# Patient Record
Sex: Male | Born: 1955
Health system: Southern US, Community
[De-identification: ages and names within clinical notes are randomized; demographics above are authoritative.]

## PROBLEM LIST (undated history)

## (undated) DIAGNOSIS — F32A Depression, unspecified: Secondary | ICD-10-CM

## (undated) DIAGNOSIS — Z8719 Personal history of other diseases of the digestive system: Secondary | ICD-10-CM

## (undated) DIAGNOSIS — U071 COVID-19: Secondary | ICD-10-CM

## (undated) DIAGNOSIS — K219 Gastro-esophageal reflux disease without esophagitis: Secondary | ICD-10-CM

## (undated) DIAGNOSIS — E785 Hyperlipidemia, unspecified: Secondary | ICD-10-CM

## (undated) DIAGNOSIS — I25709 Atherosclerosis of coronary artery bypass graft(s), unspecified, with unspecified angina pectoris: Secondary | ICD-10-CM

## (undated) DIAGNOSIS — I2510 Atherosclerotic heart disease of native coronary artery without angina pectoris: Secondary | ICD-10-CM

## (undated) DIAGNOSIS — U099 Post covid-19 condition, unspecified: Secondary | ICD-10-CM

## (undated) DIAGNOSIS — G459 Transient cerebral ischemic attack, unspecified: Secondary | ICD-10-CM

## (undated) DIAGNOSIS — I10 Essential (primary) hypertension: Secondary | ICD-10-CM

## (undated) DIAGNOSIS — F329 Major depressive disorder, single episode, unspecified: Secondary | ICD-10-CM

## (undated) DIAGNOSIS — K5909 Other constipation: Secondary | ICD-10-CM

## (undated) DIAGNOSIS — N400 Enlarged prostate without lower urinary tract symptoms: Secondary | ICD-10-CM

## (undated) DIAGNOSIS — G4730 Sleep apnea, unspecified: Secondary | ICD-10-CM

## (undated) DIAGNOSIS — I219 Acute myocardial infarction, unspecified: Secondary | ICD-10-CM

## (undated) DIAGNOSIS — R053 Chronic cough: Secondary | ICD-10-CM

## (undated) DIAGNOSIS — F419 Anxiety disorder, unspecified: Secondary | ICD-10-CM

## (undated) DIAGNOSIS — E119 Type 2 diabetes mellitus without complications: Secondary | ICD-10-CM

## (undated) DIAGNOSIS — K2270 Barrett's esophagus without dysplasia: Secondary | ICD-10-CM

## (undated) DIAGNOSIS — K635 Polyp of colon: Secondary | ICD-10-CM

## (undated) DIAGNOSIS — Z974 Presence of external hearing-aid: Secondary | ICD-10-CM

## (undated) HISTORY — PX: OTHER SURGICAL HISTORY: SHX169

## (undated) HISTORY — PX: COLONOSCOPY: SHX174

## (undated) HISTORY — DX: Sleep apnea, unspecified: G47.30

## (undated) HISTORY — DX: Atherosclerosis of coronary artery bypass graft(s), unspecified, with unspecified angina pectoris: I25.709

## (undated) HISTORY — DX: Anxiety disorder, unspecified: F41.9

## (undated) HISTORY — DX: Essential (primary) hypertension: I10

## (undated) HISTORY — PX: TEE WITH CARDIOVERSION: SHX5442

## (undated) HISTORY — DX: Acute myocardial infarction, unspecified: I21.9

## (undated) HISTORY — DX: Gastro-esophageal reflux disease without esophagitis: K21.9

## (undated) HISTORY — DX: Polyp of colon: K63.5

## (undated) HISTORY — DX: Type 2 diabetes mellitus without complications: E11.9

## (undated) HISTORY — PX: ANAL FISSURE REPAIR: SHX2312

## (undated) HISTORY — DX: Barrett's esophagus without dysplasia: K22.70

## (undated) HISTORY — DX: Personal history of other diseases of the digestive system: Z87.19

## (undated) HISTORY — DX: Atherosclerotic heart disease of native coronary artery without angina pectoris: I25.10

## (undated) HISTORY — DX: Other constipation: K59.09

## (undated) HISTORY — DX: Hyperlipidemia, unspecified: E78.5

---

## 1983-11-23 HISTORY — PX: EXCISION MASS NECK: SHX6703

## 2005-04-10 ENCOUNTER — Other Ambulatory Visit: Payer: Self-pay

## 2005-04-10 ENCOUNTER — Emergency Department: Payer: Self-pay | Admitting: Emergency Medicine

## 2005-06-17 ENCOUNTER — Ambulatory Visit: Payer: Self-pay | Admitting: Gastroenterology

## 2005-06-30 ENCOUNTER — Emergency Department: Payer: Self-pay | Admitting: Unknown Physician Specialty

## 2006-02-09 ENCOUNTER — Ambulatory Visit: Payer: Self-pay | Admitting: Gastroenterology

## 2009-03-14 ENCOUNTER — Inpatient Hospital Stay: Payer: Self-pay | Admitting: Internal Medicine

## 2009-03-19 ENCOUNTER — Inpatient Hospital Stay: Payer: Self-pay | Admitting: Internal Medicine

## 2009-04-22 ENCOUNTER — Ambulatory Visit: Payer: Self-pay | Admitting: Cardiology

## 2009-04-22 ENCOUNTER — Inpatient Hospital Stay (HOSPITAL_COMMUNITY): Admission: EM | Admit: 2009-04-22 | Discharge: 2009-04-24 | Payer: Self-pay | Admitting: Emergency Medicine

## 2009-05-09 ENCOUNTER — Observation Stay (HOSPITAL_COMMUNITY): Admission: EM | Admit: 2009-05-09 | Discharge: 2009-05-10 | Payer: Self-pay | Admitting: Emergency Medicine

## 2009-05-09 ENCOUNTER — Ambulatory Visit: Payer: Self-pay | Admitting: Cardiology

## 2009-05-28 ENCOUNTER — Encounter: Payer: Self-pay | Admitting: Cardiology

## 2009-06-22 ENCOUNTER — Encounter: Payer: Self-pay | Admitting: Cardiology

## 2009-07-03 ENCOUNTER — Ambulatory Visit: Payer: Self-pay | Admitting: Cardiology

## 2009-07-03 ENCOUNTER — Observation Stay (HOSPITAL_COMMUNITY): Admission: EM | Admit: 2009-07-03 | Discharge: 2009-07-04 | Payer: Self-pay | Admitting: Emergency Medicine

## 2009-07-23 ENCOUNTER — Encounter: Payer: Self-pay | Admitting: Cardiology

## 2009-08-20 ENCOUNTER — Ambulatory Visit: Payer: Self-pay | Admitting: Unknown Physician Specialty

## 2009-08-22 ENCOUNTER — Encounter: Payer: Self-pay | Admitting: Cardiology

## 2009-10-22 ENCOUNTER — Ambulatory Visit: Payer: Self-pay | Admitting: Gastroenterology

## 2009-11-18 ENCOUNTER — Ambulatory Visit: Payer: Self-pay | Admitting: Internal Medicine

## 2009-11-18 ENCOUNTER — Inpatient Hospital Stay (HOSPITAL_COMMUNITY): Admission: EM | Admit: 2009-11-18 | Discharge: 2009-11-20 | Payer: Self-pay | Admitting: Emergency Medicine

## 2009-11-19 ENCOUNTER — Encounter: Payer: Self-pay | Admitting: Internal Medicine

## 2009-11-22 HISTORY — PX: CORONARY ANGIOPLASTY WITH STENT PLACEMENT: SHX49

## 2009-11-22 HISTORY — PX: CARDIAC CATHETERIZATION: SHX172

## 2010-04-07 ENCOUNTER — Ambulatory Visit: Payer: Self-pay | Admitting: Internal Medicine

## 2010-04-07 ENCOUNTER — Observation Stay (HOSPITAL_COMMUNITY): Admission: EM | Admit: 2010-04-07 | Discharge: 2010-04-08 | Payer: Self-pay | Admitting: Emergency Medicine

## 2010-04-09 ENCOUNTER — Inpatient Hospital Stay: Payer: Self-pay | Admitting: Internal Medicine

## 2010-04-14 ENCOUNTER — Emergency Department: Payer: Self-pay | Admitting: Emergency Medicine

## 2010-04-22 ENCOUNTER — Ambulatory Visit: Payer: Self-pay | Admitting: Cardiology

## 2010-04-28 ENCOUNTER — Encounter: Payer: Self-pay | Admitting: Cardiology

## 2010-05-01 ENCOUNTER — Ambulatory Visit: Payer: Self-pay | Admitting: Specialist

## 2010-05-22 ENCOUNTER — Encounter: Payer: Self-pay | Admitting: Cardiology

## 2010-06-17 ENCOUNTER — Ambulatory Visit: Payer: Self-pay | Admitting: Gastroenterology

## 2010-06-26 ENCOUNTER — Observation Stay: Payer: Self-pay | Admitting: Specialist

## 2010-12-22 NOTE — Letter (Signed)
Summary: [employer] Certification Initial Review Notice   [employer] Initial Review Notice   Imported By: Roderic Ovens 06/15/2010 14:18:21  _____________________________________________________________________  External Attachment:    Type:   Image     Comment:   External Document

## 2010-12-31 ENCOUNTER — Telehealth: Payer: Self-pay | Admitting: Cardiology

## 2011-01-07 NOTE — Progress Notes (Signed)
Summary: refill request  Phone Note Refill Request   linsinipril    Method Requested: Telephone to Pharmacy Initial call taken by: Glynda Jaeger,  December 31, 2010 3:25 PM    New/Updated Medications: LISINOPRIL 5 MG TABS (LISINOPRIL) Take one tablet by mouth daily Prescriptions: LISINOPRIL 5 MG TABS (LISINOPRIL) Take one tablet by mouth daily  #30 x 12   Entered by:   Kem Parkinson   Authorized by:   Ferman Hamming, MD, [facility]   Signed by:   Kem Parkinson on 12/31/2010   Method used:   Electronically to        [facility] [employer] [phone]* (retail)       [address] [place], [place]  [address]       Ph: [phone] or [phone]       Fax: [phone]   RxID:   [id number]

## 2011-02-08 LAB — BASIC METABOLIC PANEL
CO2: 24 mEq/L (ref 19–32)
Creatinine, Ser: 1.31 mg/dL (ref 0.4–1.5)
GFR calc non Af Amer: 57 mL/min — ABNORMAL LOW (ref >60)

## 2011-02-08 LAB — CBC
HCT: 43.2 % (ref 39.0–52.0)
Hemoglobin: 14.9 g/dL (ref 13.0–17.0)
MCHC: 34.5 g/dL (ref 30.0–36.0)
MCV: 88.4 fL (ref 78.0–100.0)
RDW: 14.2 % (ref 11.5–15.5)
WBC: 7.7 10*3/uL (ref 4.0–10.5)

## 2011-02-08 LAB — POCT CARDIAC MARKERS
CKMB, poc: 1.1 ng/mL (ref 1.0–8.0)
CKMB, poc: 1.6 ng/mL (ref 1.0–8.0)
Myoglobin, poc: 117 ng/mL (ref 12–200)
Myoglobin, poc: 118 ng/mL (ref 12–200)

## 2011-02-08 LAB — DIFFERENTIAL
Basophils Relative: 1 % (ref 0–1)
Eosinophils Absolute: 0.3 10*3/uL (ref 0.0–0.7)
Lymphs Abs: 1 10*3/uL (ref 0.7–4.0)
Neutrophils Relative %: 77 % (ref 43–77)

## 2011-02-08 LAB — TROPONIN I: Troponin I: <0.01 ng/mL (ref 0.00–0.06)

## 2011-02-08 LAB — GLUCOSE, CAPILLARY: Glucose-Capillary: 63 mg/dL — ABNORMAL LOW (ref 70–99)

## 2011-02-08 LAB — CK TOTAL AND CKMB (NOT AT ARMC)
CK, MB: 2.5 ng/mL (ref 0.3–4.0)
Total CK: 266 U/L — ABNORMAL HIGH (ref 7–232)

## 2011-02-08 LAB — CARDIAC PANEL(CRET KIN+CKTOT+MB+TROPI): Troponin I: <0.01 ng/mL (ref 0.00–0.06)

## 2011-02-22 LAB — BASIC METABOLIC PANEL
Calcium: 8.6 mg/dL (ref 8.4–10.5)
Chloride: 107 mEq/L (ref 96–112)
Creatinine, Ser: 1.11 mg/dL (ref 0.4–1.5)
Creatinine, Ser: 1.12 mg/dL (ref 0.4–1.5)
GFR calc Af Amer: >60 mL/min (ref >60)
GFR calc Af Amer: >60 mL/min (ref >60)
GFR calc non Af Amer: >60 mL/min (ref >60)
GFR calc non Af Amer: >60 mL/min (ref >60)
Potassium: 4.2 mEq/L (ref 3.5–5.1)

## 2011-02-22 LAB — CBC
HCT: 38.9 % — ABNORMAL LOW (ref 39.0–52.0)
HCT: 39.8 % (ref 39.0–52.0)
Hemoglobin: 13.7 g/dL (ref 13.0–17.0)
Hemoglobin: 13.9 g/dL (ref 13.0–17.0)
MCHC: 34.8 g/dL (ref 30.0–36.0)
MCV: 88.6 fL (ref 78.0–100.0)
Platelets: 130 10*3/uL — ABNORMAL LOW (ref 150–400)
Platelets: 131 10*3/uL — ABNORMAL LOW (ref 150–400)
RBC: 4.41 MIL/uL (ref 4.22–5.81)
RBC: 4.49 MIL/uL (ref 4.22–5.81)
RDW: 13.7 % (ref 11.5–15.5)
RDW: 13.8 % (ref 11.5–15.5)
WBC: 5.6 10*3/uL (ref 4.0–10.5)

## 2011-02-22 LAB — COMPREHENSIVE METABOLIC PANEL
CO2: 25 mEq/L (ref 19–32)
Calcium: 8.7 mg/dL (ref 8.4–10.5)
Creatinine, Ser: 1.07 mg/dL (ref 0.4–1.5)
GFR calc Af Amer: >60 mL/min (ref >60)
GFR calc non Af Amer: >60 mL/min (ref >60)
Sodium: 138 mEq/L (ref 135–145)
Total Bilirubin: 1 mg/dL (ref 0.3–1.2)

## 2011-02-22 LAB — HEPARIN LEVEL (UNFRACTIONATED): Heparin Unfractionated: 0.37 IU/mL (ref 0.30–0.70)

## 2011-02-22 LAB — LIPID PANEL
Cholesterol: 114 mg/dL (ref 0–200)
LDL Cholesterol: 70 mg/dL (ref 0–99)
Total CHOL/HDL Ratio: 3.9 RATIO

## 2011-02-22 LAB — DIFFERENTIAL
Basophils Absolute: 0 10*3/uL (ref 0.0–0.1)
Eosinophils Absolute: 0.2 10*3/uL (ref 0.0–0.7)
Lymphs Abs: 0.8 10*3/uL (ref 0.7–4.0)
Monocytes Absolute: 0.4 10*3/uL (ref 0.1–1.0)
Monocytes Relative: 8 % (ref 3–12)
Neutro Abs: 4 10*3/uL (ref 1.7–7.7)
Neutrophils Relative %: 74 % (ref 43–77)

## 2011-02-22 LAB — POCT CARDIAC MARKERS
Myoglobin, poc: 84.1 ng/mL (ref 12–200)
Troponin i, poc: <0.05 ng/mL (ref 0.00–0.09)

## 2011-02-22 LAB — CARDIAC PANEL(CRET KIN+CKTOT+MB+TROPI)
Total CK: 114 U/L (ref 7–232)
Troponin I: <0.01 ng/mL (ref 0.00–0.06)

## 2011-02-22 LAB — GLUCOSE, CAPILLARY
Glucose-Capillary: 146 mg/dL — ABNORMAL HIGH (ref 70–99)
Glucose-Capillary: 50 mg/dL — ABNORMAL LOW (ref 70–99)
Glucose-Capillary: 52 mg/dL — ABNORMAL LOW (ref 70–99)
Glucose-Capillary: 55 mg/dL — ABNORMAL LOW (ref 70–99)
Glucose-Capillary: 73 mg/dL (ref 70–99)
Glucose-Capillary: 85 mg/dL (ref 70–99)
Glucose-Capillary: 85 mg/dL (ref 70–99)
Glucose-Capillary: 93 mg/dL (ref 70–99)
Glucose-Capillary: 95 mg/dL (ref 70–99)

## 2011-02-22 LAB — TROPONIN I: Troponin I: 0.02 ng/mL (ref 0.00–0.06)

## 2011-02-22 LAB — BRAIN NATRIURETIC PEPTIDE: Pro B Natriuretic peptide (BNP): 33 pg/mL (ref 0.0–100.0)

## 2011-02-22 LAB — PROTIME-INR: INR: 1.18 (ref 0.00–1.49)

## 2011-02-22 LAB — CK TOTAL AND CKMB (NOT AT ARMC): Relative Index: INVALID (ref 0.0–2.5)

## 2011-02-27 LAB — BASIC METABOLIC PANEL
BUN: 13 mg/dL (ref 6–23)
CO2: 24 mEq/L (ref 19–32)
Calcium: 8.5 mg/dL (ref 8.4–10.5)
GFR calc non Af Amer: >60 mL/min (ref >60)
Glucose, Bld: 105 mg/dL — ABNORMAL HIGH (ref 70–99)

## 2011-02-27 LAB — DIFFERENTIAL
Basophils Absolute: 0 10*3/uL (ref 0.0–0.1)
Basophils Relative: 1 % (ref 0–1)
Eosinophils Relative: 4 % (ref 0–5)
Lymphocytes Relative: 13 % (ref 12–46)
Monocytes Absolute: 0.4 10*3/uL (ref 0.1–1.0)
Neutro Abs: 3.8 10*3/uL (ref 1.7–7.7)

## 2011-02-27 LAB — CARDIAC PANEL(CRET KIN+CKTOT+MB+TROPI)
CK, MB: 1 ng/mL (ref 0.3–4.0)
CK, MB: 1.1 ng/mL (ref 0.3–4.0)
Relative Index: INVALID (ref 0.0–2.5)
Relative Index: INVALID (ref 0.0–2.5)
Total CK: 70 U/L (ref 7–232)
Total CK: 73 U/L (ref 7–232)
Troponin I: 0.01 ng/mL (ref 0.00–0.06)
Troponin I: <0.01 ng/mL (ref 0.00–0.06)

## 2011-02-27 LAB — POCT CARDIAC MARKERS
CKMB, poc: <1 ng/mL — ABNORMAL LOW (ref 1.0–8.0)
Troponin i, poc: <0.05 ng/mL (ref 0.00–0.09)

## 2011-02-27 LAB — CBC
HCT: 39.5 % (ref 39.0–52.0)
MCHC: 34.4 g/dL (ref 30.0–36.0)
Platelets: 138 10*3/uL — ABNORMAL LOW (ref 150–400)
RDW: 13.9 % (ref 11.5–15.5)

## 2011-02-27 LAB — TROPONIN I: Troponin I: <0.01 ng/mL (ref 0.00–0.06)

## 2011-02-27 LAB — GLUCOSE, CAPILLARY: Glucose-Capillary: 84 mg/dL (ref 70–99)

## 2011-02-27 LAB — CK TOTAL AND CKMB (NOT AT ARMC): Relative Index: INVALID (ref 0.0–2.5)

## 2011-03-01 LAB — CARDIAC PANEL(CRET KIN+CKTOT+MB+TROPI)
CK, MB: 0.9 ng/mL (ref 0.3–4.0)
CK, MB: 1.3 ng/mL (ref 0.3–4.0)
Relative Index: INVALID (ref 0.0–2.5)
Total CK: 56 U/L (ref 7–232)
Total CK: 58 U/L (ref 7–232)
Troponin I: 0.01 ng/mL (ref 0.00–0.06)

## 2011-03-01 LAB — D-DIMER, QUANTITATIVE: D-Dimer, Quant: 0.33 ug/mL-FEU (ref 0.00–0.48)

## 2011-03-01 LAB — POCT CARDIAC MARKERS
CKMB, poc: <1 ng/mL — ABNORMAL LOW (ref 1.0–8.0)
CKMB, poc: <1 ng/mL — ABNORMAL LOW (ref 1.0–8.0)
Myoglobin, poc: 105 ng/mL (ref 12–200)
Myoglobin, poc: 90.9 ng/mL (ref 12–200)
Myoglobin, poc: 96.8 ng/mL (ref 12–200)
Myoglobin, poc: 110 ng/mL (ref 12–200)
Myoglobin, poc: 80.5 ng/mL (ref 12–200)
Troponin i, poc: <0.05 ng/mL (ref 0.00–0.09)
Troponin i, poc: <0.05 ng/mL (ref 0.00–0.09)

## 2011-03-01 LAB — GLUCOSE, CAPILLARY
Glucose-Capillary: 187 mg/dL — ABNORMAL HIGH (ref 70–99)
Glucose-Capillary: 60 mg/dL — ABNORMAL LOW (ref 70–99)
Glucose-Capillary: 63 mg/dL — ABNORMAL LOW (ref 70–99)
Glucose-Capillary: 72 mg/dL (ref 70–99)
Glucose-Capillary: 109 mg/dL — ABNORMAL HIGH (ref 70–99)
Glucose-Capillary: 79 mg/dL (ref 70–99)
Glucose-Capillary: 87 mg/dL (ref 70–99)
Glucose-Capillary: 90 mg/dL (ref 70–99)
Glucose-Capillary: 90 mg/dL (ref 70–99)
Glucose-Capillary: 92 mg/dL (ref 70–99)
Glucose-Capillary: 99 mg/dL (ref 70–99)
Glucose-Capillary: 99 mg/dL (ref 70–99)

## 2011-03-01 LAB — DIFFERENTIAL
Eosinophils Relative: 3 % (ref 0–5)
Lymphocytes Relative: 9 % — ABNORMAL LOW (ref 12–46)
Lymphs Abs: 0.6 10*3/uL — ABNORMAL LOW (ref 0.7–4.0)

## 2011-03-01 LAB — COMPREHENSIVE METABOLIC PANEL
BUN: 15 mg/dL (ref 6–23)
CO2: 24 mEq/L (ref 19–32)
Calcium: 9.7 mg/dL (ref 8.4–10.5)
Creatinine, Ser: 1.06 mg/dL (ref 0.4–1.5)
GFR calc non Af Amer: >60 mL/min (ref >60)
Glucose, Bld: 49 mg/dL — ABNORMAL LOW (ref 70–99)

## 2011-03-01 LAB — POCT I-STAT, CHEM 8
BUN: 17 mg/dL (ref 6–23)
Creatinine, Ser: 1.3 mg/dL (ref 0.4–1.5)
Potassium: 4.6 mEq/L (ref 3.5–5.1)
Sodium: 140 mEq/L (ref 135–145)

## 2011-03-01 LAB — CBC
HCT: 39.8 % (ref 39.0–52.0)
HCT: 43.9 % (ref 39.0–52.0)
Hemoglobin: 14.8 g/dL (ref 13.0–17.0)
MCHC: 34.9 g/dL (ref 30.0–36.0)
Platelets: 145 10*3/uL — ABNORMAL LOW (ref 150–400)
Platelets: 157 10*3/uL (ref 150–400)
RBC: 5.01 MIL/uL (ref 4.22–5.81)
RDW: 14.7 % (ref 11.5–15.5)
RDW: 14.7 % (ref 11.5–15.5)
WBC: 6.5 10*3/uL (ref 4.0–10.5)

## 2011-03-01 LAB — TROPONIN I: Troponin I: 0.01 ng/mL (ref 0.00–0.06)

## 2011-03-01 LAB — APTT: aPTT: 32 seconds (ref 24–37)

## 2011-03-01 LAB — HEPARIN LEVEL (UNFRACTIONATED): Heparin Unfractionated: 0.74 IU/mL — ABNORMAL HIGH (ref 0.30–0.70)

## 2011-03-01 LAB — PROTIME-INR: Prothrombin Time: 13.9 seconds (ref 11.6–15.2)

## 2011-03-01 LAB — TSH: TSH: 0.76 u[IU]/mL (ref 0.350–4.500)

## 2011-03-24 IMAGING — CR DG CHEST 1V PORT
1 series · 1 of 1 positions shown · non-contrast
Comparison: none

REASON FOR EXAM: Chest Pain
COMMENTS:

PROCEDURE:     DXR - DXR PORTABLE CHEST SINGLE VIEW  - March 14, 2009 [DATE]
RESULT:     The lungs are clear. The heart and pulmonary vessels are normal.
The bony and mediastinal structures are unremarkable. There is no effusion.
There is no pneumothorax or evidence of congestive failure.

[view not recorded]
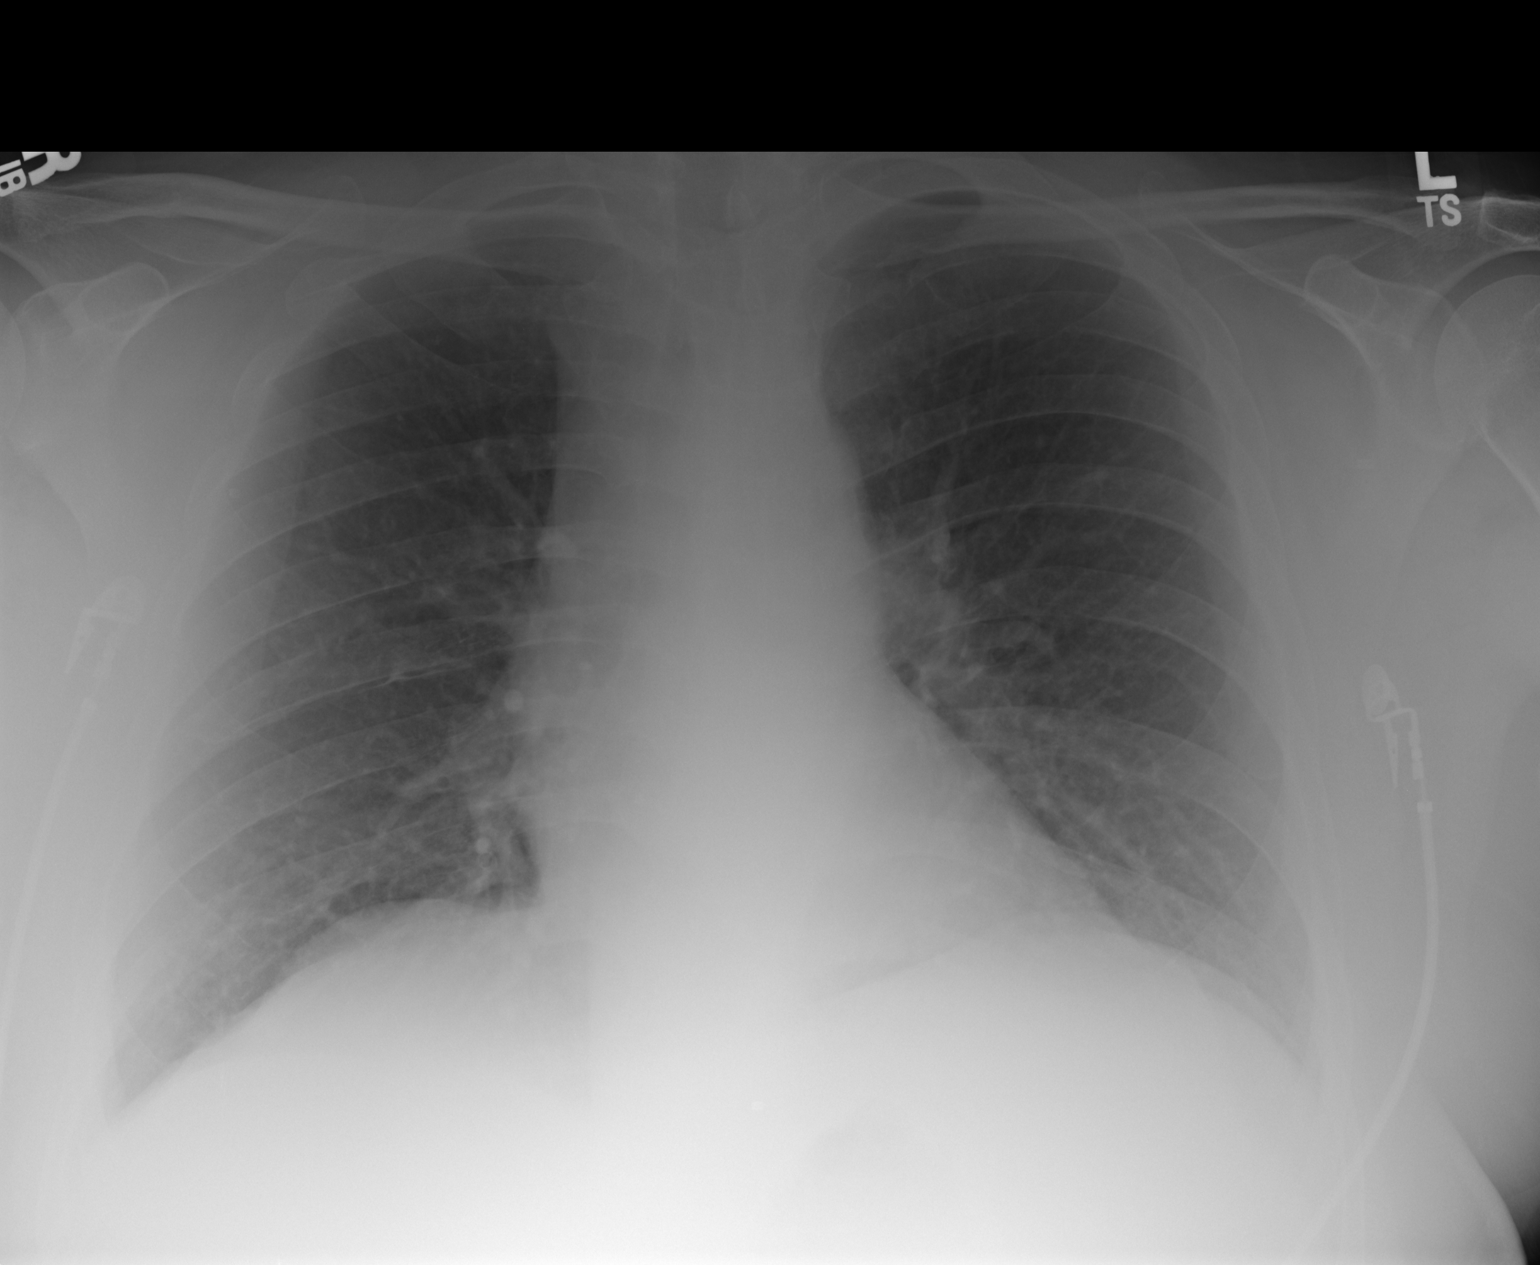

[1 of 1 positions shown; findings below may reference images not displayed]

IMPRESSION: No acute cardiopulmonary disease.

## 2011-03-24 IMAGING — CT CT HEAD WITHOUT CONTRAST
2 series · 16 of 30 positions shown, 20 images · non-contrast
Comparison: none

REASON FOR EXAM: headache, R sided tingling, vertigo
COMMENTS:

PROCEDURE:     CT  - CT HEAD WITHOUT CONTRAST  - March 14, 2009 [DATE]
RESULT:
HISTORY: Headache.
COMPARISON STUDIES: Head CT of 04-10-05.
PROCEDURE AND FINDINGS: Standard non-enhanced Head CT is obtained. No mass
lesion is noted. There is no hydrocephalus. No acute bony abnormalities are
identified.

[Series 2: without · axial · non-contrast · 0.41mm/px · z∈[-157,-32]mm · 13 of 31 slices shown, 17 images]
[im 3/31  brain]
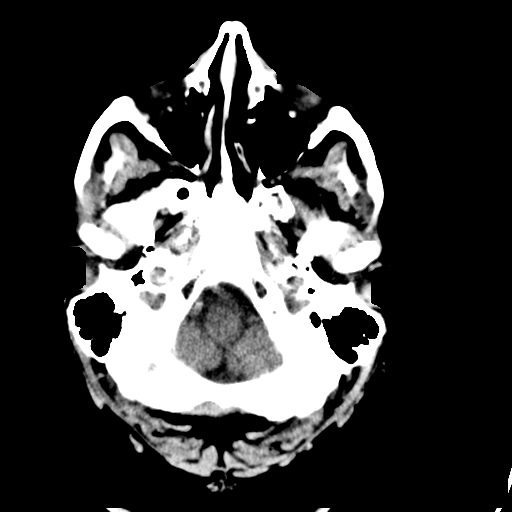
[im 3/31  bone]
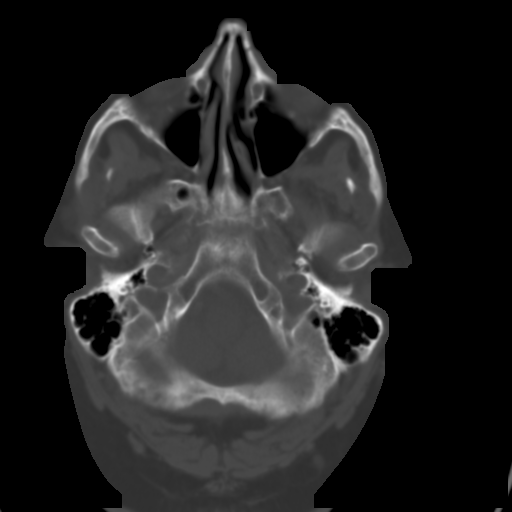
[im 5/31  brain]
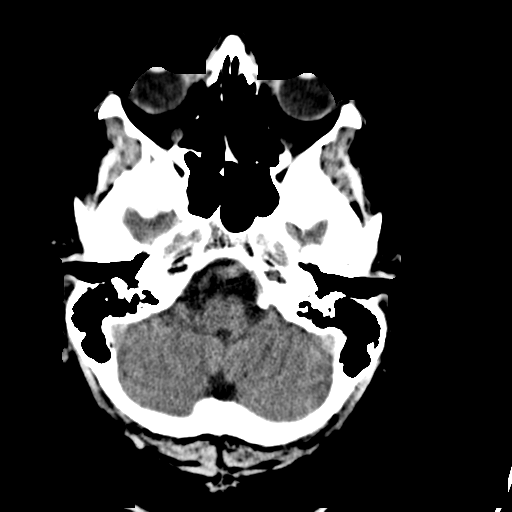
[im 7/31  brain]
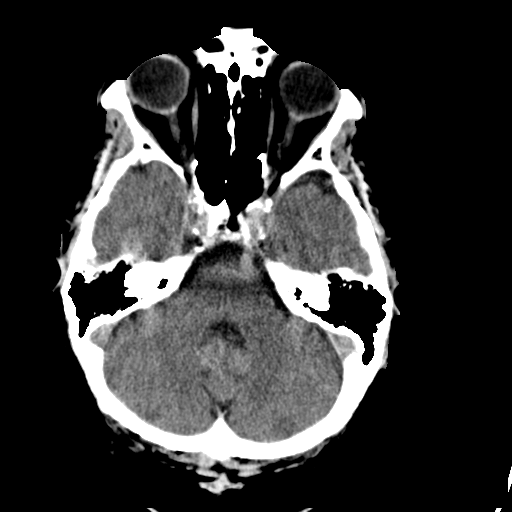
[im 9/31  brain]
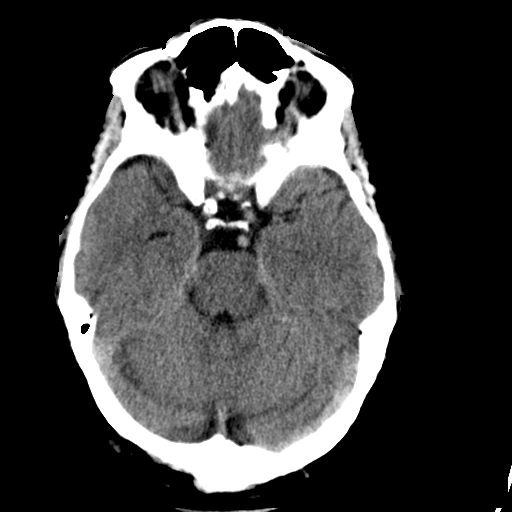
[im 11/31  brain]
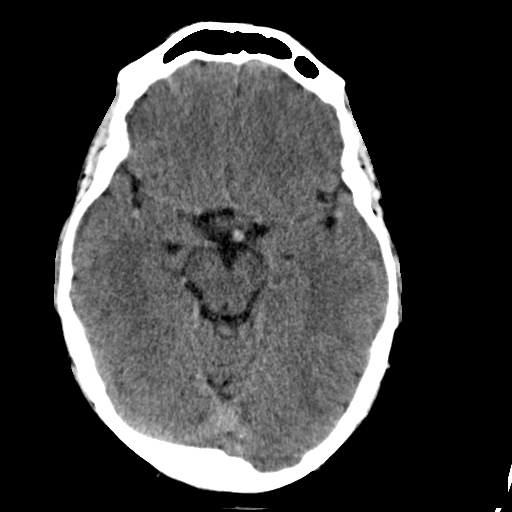
[im 11/31  bone]
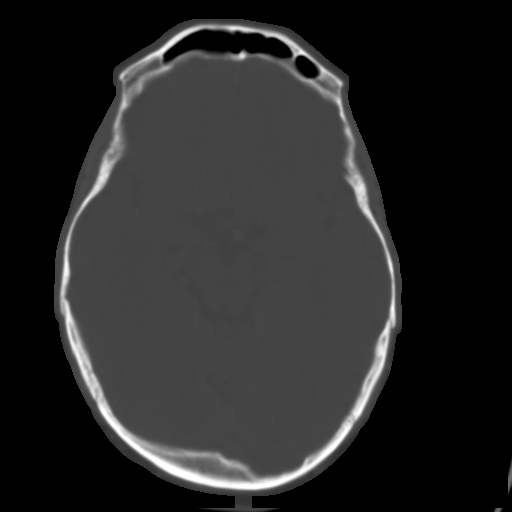
[im 13/31  brain]
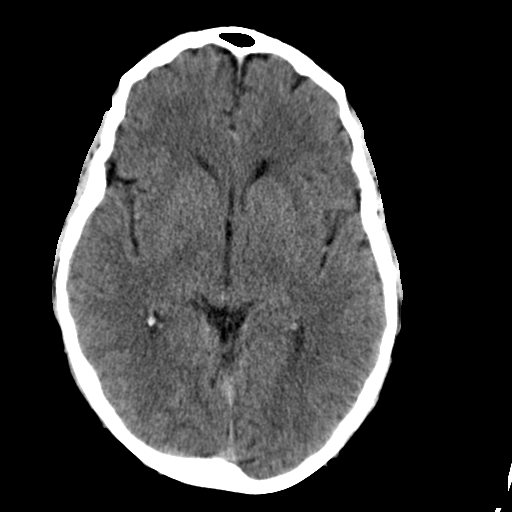
[im 16/31  brain]
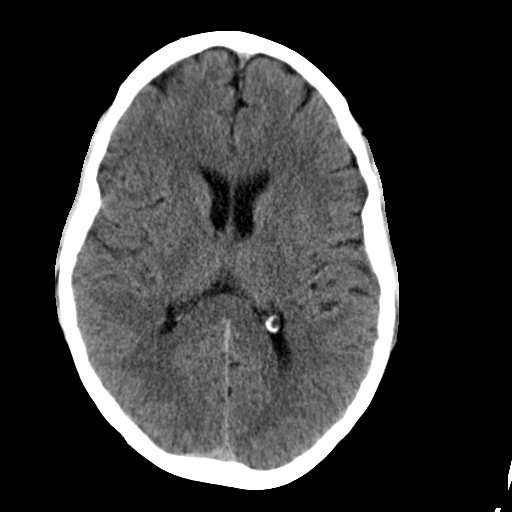
[im 18/31  brain]
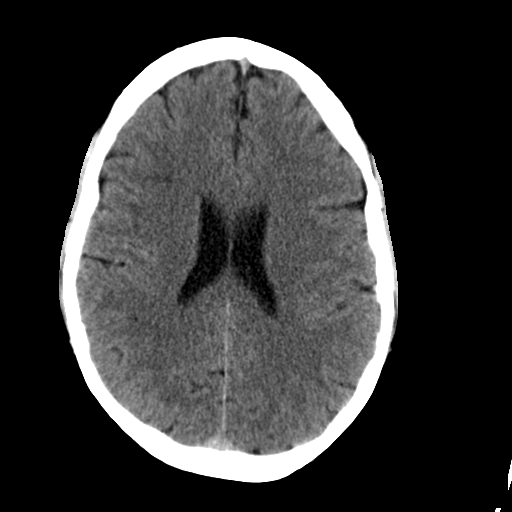
[im 20/31  brain]
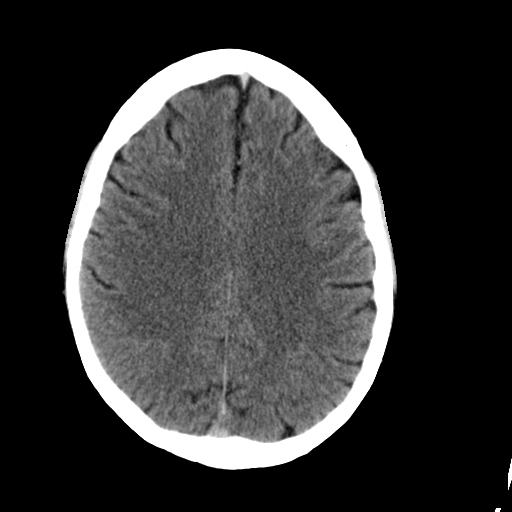
[im 20/31  bone]
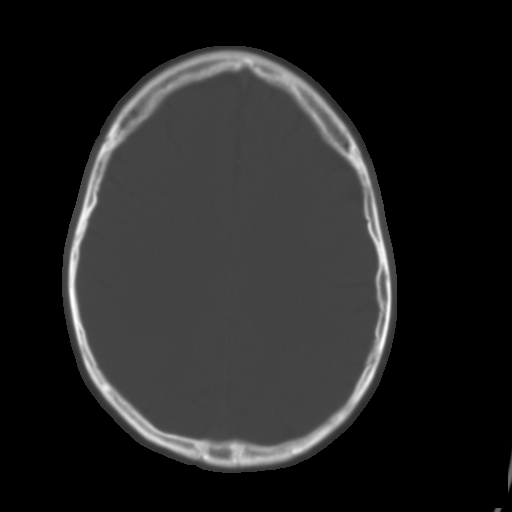
[im 22/31  brain]
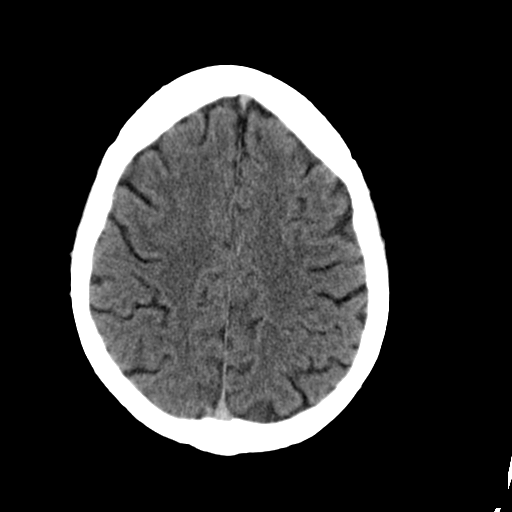
[im 24/31  brain]
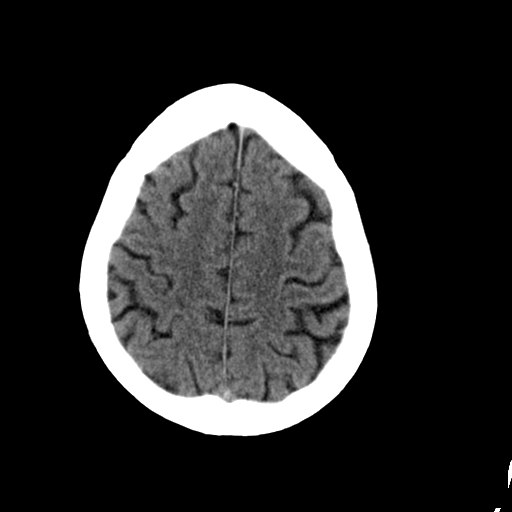
[im 26/31  brain]
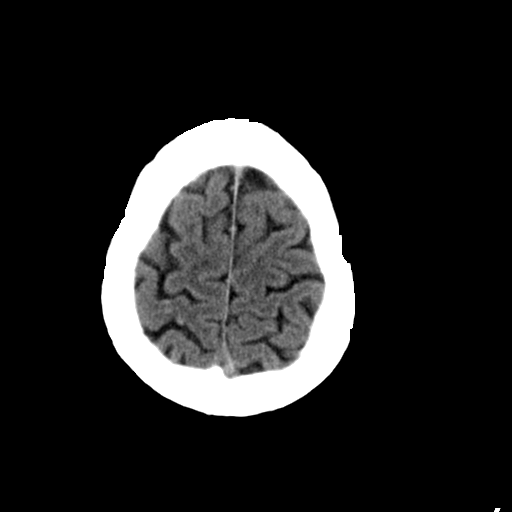
[im 28/31  brain]
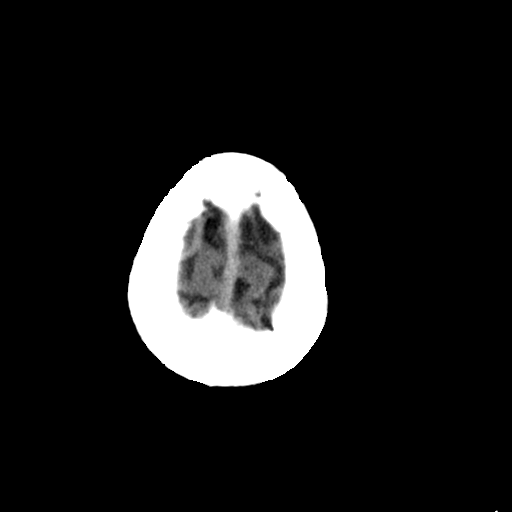
[im 28/31  bone]
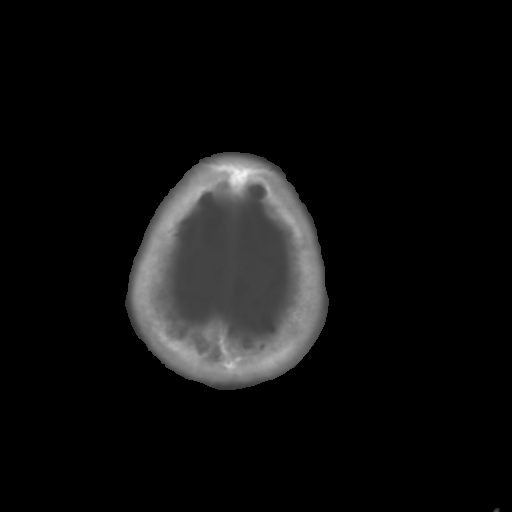

[Series 3: bone · axial · 0.41mm/px · z∈[-157,-117]mm · 3 of 31 slices shown]
[im 3/31  bone]
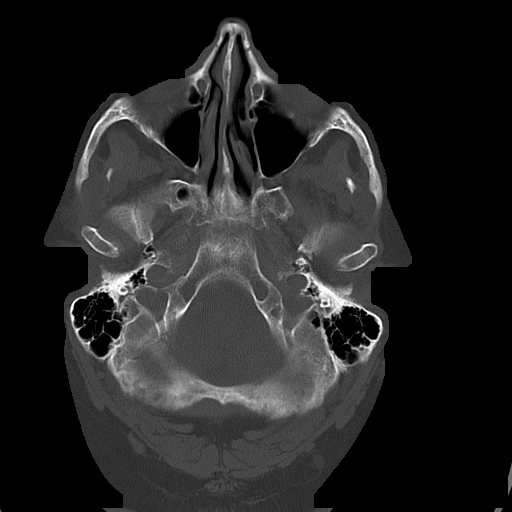
[im 7/31  bone]
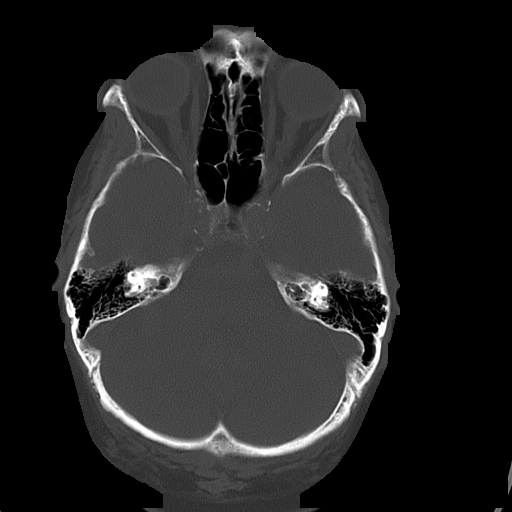
[im 11/31  bone]
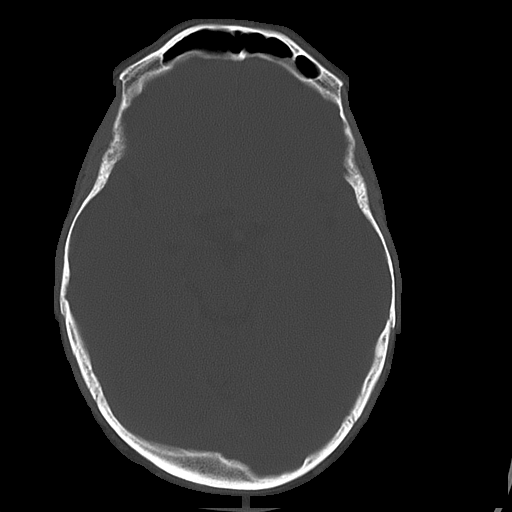

[16 of 30 positions shown; findings below may reference images not displayed]

IMPRESSION: 1. No acute abnormality. The posterior fossa is unremarkable.

## 2011-04-06 NOTE — Discharge Summary (Signed)
[name], [name] NO.:  [id number]   MEDICAL RECORD NO.:  [id number]          PATIENT TYPE:  INP   LOCATION:  4707                         FACILITY:  [facility]   PHYSICIAN:  [name], MDDATE OF BIRTH:  07/29/1956   DATE OF ADMISSION:  04/22/2009  DATE OF DISCHARGE:                               DISCHARGE SUMMARY   PRIMARY CARDIOLOGIST:  Lee Millard, MD, at [facility] in  [place]   PRIMARY CARE PHYSICIAN:  [name], at [facility] in  [place], [place].   DISCHARGE DIAGNOSIS:  Noncardiac chest pain.   SECONDARY DIAGNOSES:  1. Coronary artery disease, status post cardiac catheterization with      drug-eluting stent to the right coronary artery and posterior      descending artery in March 14, 2009, and status post drug-eluting      stent to the first obtuse marginal on March 20, 2009.  2. Diabetes mellitus.  3. Hypertension.  4. Hyperlipidemia.  5. Morbid obesity, (BMI 40.7).  6. History of Barrett esophagus.   ALLERGIES:  NKDA.   PROCEDURES PERFORMED DURING THIS HOSPITALIZATION:  1. EKG performed on April 22, 2009, showing normal sinus rhythm at a      rate of 71, T-wave inversion in lead III, otherwise no acute ST-T-      wave changes, no significant Q-waves.  Normal axis.  No evidence of      hypertrophy.  PR 160, QRS 88, and QTc 400.  2. Chest x-ray completed on April 22, 2009, showing no acute      cardiopulmonary disease.  3. Cardiac catheterization performed on April 23, 2009, revealing double-      vessel CAD, patent stents in OM1 and RCA, and normal LV function.      Recommendation is for continued medical management.   HISTORY OF PRESENT ILLNESS:  [name] is a 55 year old Caucasian  male with a known history of CAD.  He was in his usual state of health  this a.m. when he experienced diaphoresis and nausea with minimal  exertion.  He went on to work where he developed chest pain.   He  describes pain as both the pressure and a twisting sensation at 8/10 at  its worst, which was associated with diaphoresis, nausea, and shortness  of breath.  There was no radiation.  He took one sublingual  nitroglycerin with symptom alleviation.  The symptoms returned and he  contacted his primary MD, then his cardiologist who advised EMS  transport to closest hospital.  He took a second sublingual  nitroglycerin in route to the hospital.  Pain was relieved, but BP  dropped.  He received fluid resuscitation with resolution of his  hypertension.  In the ED, his symptoms returned and was treated with  morphine x2.  At the time of his eval by Cardiology at the hospital.  He  reports his symptoms today were different from his prior angina.   HOSPITAL COURSE:  The patient admitted and underwent procedures as  described above.  He tolerated them well without significant  complications.  Per recommendations from  his interventional  cardiologist, the patient will continue medical management.  His  metformin will be held for 48 hours.  He will be discharged today as he  is in stable condition.  He has been instructed to follow up with his  primary cardiologist in 2 weeks.  At the time of discharge, he received  his medication list (unchanged), follow up instructions, and post  cardiac cath instructions.  All questions and concerns will be addressed  at that time.   One set of negative point of care markers and 3 full sets of cardiac  enzymes were negative.  TSH 0.760, otherwise see H and P.   FOLLOWUP PLANS AND APPOINTMENTS:  1. Dr. Darrold [name], follow up in approximately 2 weeks, the patient to      schedule.  2. Dr. Terance [name], follow up in approximately 4 weeks, the patient to      schedule.   DISCHARGE MEDICATIONS:  1. Glipizide 10 mg p.o. b.i.d.  2. Metformin 850 mg p.o. b.i.d. (to be started on April 25, 2009).  3. Aspirin 81 mg p.o. q.i.d., (4 tablets daily).  4. Lipitor 40 mg p.o.  daily.  5. Lisinopril/Hydrochlorothiazide 20/25 mg p.o. daily.  6. Metoprolol succinate 25 mg p.o. daily.  7. Xanax 0.25 mg p.r.n. for anxiety.   DURATION OF DISCHARGE ENCOUNTER INCLUDING PHYSICIAN TIME:  35 minutes.      [name], [facility]      Lee Carrow, MD  Electronically Signed    MS/MEDQ  D:  04/23/2009  T:  04/24/2009  Job:  [id number]   cc:   Lee Rieger, MD

## 2011-04-06 NOTE — H&P (Signed)
[name], [name] NO.:  [id number]   MEDICAL RECORD NO.:  [id number]          PATIENT TYPE:  INP   LOCATION:  4707                         FACILITY:  [facility]   PHYSICIAN:  Luis Abed, MD, FACCDATE OF BIRTH:  [birth year]   DATE OF ADMISSION:  04/22/2009  DATE OF DISCHARGE:                              HISTORY & PHYSICAL   PRIMARY CARE PHYSICIAN:  Dr. Terance Hart at [facility] in [place],  [place].   PRIMARY CARDIOLOGIST:  Dr. Darrold Junker at the [facility] in  [place], [place].   CHIEF COMPLAINT:  Chest pain.   HISTORY OF PRESENT ILLNESS:  Mr. Peddy is a 55 year old male with a  history of coronary artery disease.  He was in his usual state of health  this a.m. and had diaphoresis and nausea with minimal exertion.  He went  on to work and at work, he developed chest pain.  He describes as both a  pressure and a twisting sensation.  It reached to an 8/10.  He again had  diaphoresis, nausea, and shortness of breath.  There was no radiation.  He tried one sublingual nitroglycerin, which helped his symptoms;  however, they return.  He contacted his primary MD and then his  cardiologist who advised EMS transport to the closest hospital.  He  called EMS and received a second sublingual nitroglycerin during  transport.  This relieved his pain, but his blood pressure dropped.  He  received fluid resuscitation and his blood pressure improved.  In the  emergency room when his symptoms returned, he was treated with morphine  twice and after the second dose of morphine, the pain has not come back.  His previous angina was pressure sensation, which he feels it is  different from the feeling he had today.  He last had a stent on March 20, 2009 and since that stent, this is the first episode of any type of  chest pain he has had.  Currently, he is resting comfortably.   PAST MEDICAL HISTORY:  1. Status post cardiac catheterization with  drug-eluting stents to the      RCA and PDA on March 14, 2009.  2. Status post drug-eluting stent to the OM 1 on March 20, 2009.  3. Diabetes.  4. Hypertension.  5. Hyperlipidemia.  6. Morbid obesity with a body mass index of 40.7.  7. History of Barrett esophagus.   SURGICAL HISTORY:  He is status post cardiac catheterizations as well as  treatment for folliculitis requiring skin grafts.  He has also had a  colonoscopy and an EGD.   ALLERGIES:  No known drug allergies.   CURRENT MEDICATIONS:  1. Xanax 0.25 mg p.r.n.  2. Aspirin 325 mg or 81 mg x4 daily.  3. Glipizide 10 mg b.i.d.  4. Lipitor 40 mg a day.  5. Lisinopril and hydrochlorothiazide 20/25 mg daily.  6. Metformin 850 mg b.i.d.  7. Metoprolol tartrate 25 mg daily.  8. Plavix 75 mg daily.  9. Sublingual nitroglycerin p.r.n.   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  He denies any history of alcohol, tobacco,  or drug abuse.   FAMILY HISTORY:  Both of his parents are living in her late 80s or early  36s and neither one nor his siblings has any heart disease.   REVIEW OF SYSTEMS:  He has had diaphoresis today.  He says he coughs in  the morning, but it is nonproductive.  He has some chronic dyspnea on  exertion that has not changed recently.  He denies orthopnea, PND,  edema, or palpitations.  He has had no presyncope or claudication  symptoms or wheezing.  He has had nausea today, but no melena.  No  reflux symptoms.  No abdominal pain.  Full 14-point review of systems is  otherwise negative.   PHYSICAL EXAMINATION:  VITAL SIGNS:  Temperature 97.4, blood pressure  108/63, pulse 70, respiratory rate 16, O2 saturation 100% on 2 liters.  GENERAL:  He is a well-developed obese white male in no acute distress.  HEENT:  Normal.  NECK:  There is no lymphadenopathy, thyromegaly, or JVD noted.  CV:  His heart is regular in rate and rhythm with an S1, S2 with no   significant murmur, rub, or gallop is noted.  Distal pulses are intact  in all four extremities.  LUNGS:  Clear to auscultation bilaterally.  SKIN:  No rashes or lesions are noted.  ABDOMEN:  Soft and nontender with active bowel sounds and no  hepatosplenomegaly by percussion.  EXTREMITIES:  There was no cyanosis, clubbing, or edema noted.  MUSCULOSKELETAL:  There was no joint deformity or effusions and no spine  or CVA tenderness.  NEURO:  He is alert and oriented.  Cranial nerves II through XII grossly  intact.   Chest x-ray is pending.   EKG; sinus rhythm, rate 71 with no acute ischemic changes.   LABORATORY VALUES:  Hemoglobin 14.6, hematocrit 43.9, WBC 6.5, platelets  157.  Sodium 140, potassium 4.6, chloride 108, BUN 17, creatinine 1.3,  glucose 73, point of care markers negative x2.   IMPRESSION:  Mr. Duell was seen today by Dr. Myrtis Ser.  He is a 53-year-  old male with multiple cardiac risk factors who had stents placed in  [place] in April 2010.  He is here today with chest pain, which is  significant, but not quite the same as his prior angina.  He has had no  EKG changes and point of care markers are negative, although cardiac  enzymes have not been completely cycled.  He will be admitted to the  telemetry floor.  We will add heparin to his medication regimen and IV  nitroglycerin if he has recurrent chest pain.  We will cycle cardiac  enzymes.  Cardiac catheterization is planned and will be performed on April 23, 2009.  Further evaluation and treatment will depend on the results of  the above testing and he will follow up with his physicians in  [place] after discharge.  He will be continued on his other home  medications.      Theodore Demark, PA-C      Luis Abed, MD, [facility] [place]  Electronically Signed    RB/MEDQ  D:  04/22/2009  T:  04/23/2009  Job:  [id number]   cc:   Irene Pap, M.D.

## 2011-04-06 NOTE — Cardiovascular Report (Signed)
NAMEZAVIYAR, [name] NO.:  [id number]   MEDICAL RECORD NO.:  [id number]          PATIENT TYPE:  INP   LOCATION:  4707                         FACILITY:  [facility]   PHYSICIAN:  Verne Carrow, MDDATE OF BIRTH:  02-08-1956   DATE OF PROCEDURE:  04/23/2009  DATE OF DISCHARGE:                            CARDIAC CATHETERIZATION   PRIMARY CARDIOLOGIST:  Marcina Millard, MD in [place].   PROCEDURE PERFORMED:  1. Left heart catheterization.  2. Selective coronary angiography.  3. Left ventricular angiogram.  4. Placement of an Angio-Seal femoral artery closure device.   OPERATOR:  Verne Carrow, MD   INDICATIONS:  This is a 55 year old Caucasian male with a past medical  history significant for diabetes mellitus, hypertension, hyperlipidemia,  morbid obesity, and coronary artery disease with recent placement of a  drug-eluting stent in the right coronary artery and right posterolateral  segment on March 14, 2009.  The patient had recurrent pain and was  brought back into [facility] and had a drug-  eluting stent placed in the first obtuse marginal branch of the  circumflex system.  He was discharged to home.  He presented to our  emergency apartment on April 22, 2009, with complaints of recurrent chest  pain, diaphoresis, nausea, and shortness of breath.  He ruled out for  myocardial infarction with serial cardiac enzymes.  His EKG showed no  ischemic changes.  Because of his recent stent placement, he was  scheduled for a diagnostic left heart catheterization today.   PROCEDURE IN DETAIL:  The patient was brought to the Inpatient Cardiac  Catheterization Laboratory after signing informed consent for the  procedure.  Right groin was prepped and draped in a sterile fashion.  Lidocaine 1% was used for local anesthesia.  Standard diagnostic  catheters were used to perform selective coronary angiography.  A  pigtail catheter was  used to perform a left ventricular angiogram.  No  gradient was noted across the aortic valve on catheter pullback.  The  patient tolerated the procedure well.  An Angio-Seal femoral artery  closure device was placed in the right femoral artery without  difficulty.  The patient was taken to the holding area for recovery in  stable condition.   HEMODYNAMIC DATA:  Central aortic pressure 86/53.  Left ventricular  pressure 87/5.  Left ventricular end-diastolic pressure 15.   ANGIOGRAPHIC DATA:  1. The left main coronary artery had no obstructive disease.  2. The left anterior descending is a large vessel that courses to the      apex and gives off several moderate-sized diagonal branch.  There      is a 30% stenosis noted in the mid LAD.  The first diagonal branch      has a 40% stenosis.  There are no flow-limiting lesions noted in      this system.  3. The circumflex artery is comprised mainly of an obtuse marginal      branch.  There is a stent noted in the midportion of the obtuse      marginal branch that is patent without any evidence  of restenosis.      Just distal to the takeoff of the obtuse marginal branch, the      circumflex artery becomes very small in caliber and has a 70%      stenosis.  This vessel is too small to consider intervention.      There is plaque noted in the proximal portion of the circumflex      artery.  4. Right coronary artery is a large dominant artery that has mild 30%      plaque noted in the midportion.  The distal right coronary artery      has a stent present that shows no evidence of restenosis.  The      right posterolateral artery has a stent present and is noted to be      patent without any evidence of restenosis.  5. Left ventricular angiogram was performed in the RAO projection that      shows normal left ventricular systolic function with no wall motion      abnormalities.  Ejection fraction is estimated at 55%.  There is      mild mitral  regurgitation noted.   IMPRESSION:  1. Double-vessel coronary artery disease.  2. Patent stents noted in the first obtuse marginal branch of the      circumflex system and distal right coronary artery as well as the      right posterolateral branch.  3. Normal left ventricular systolic function.   RECOMMENDATIONS:  I recommend continued medical management.  We will  have the patient hold his metformin for 48 hours.  We will discharge him  to home today after his 2 hours of mandatory bedrest.  We will schedule  followup with Dr. Marcina Millard in 2 weeks in his [place]  office.  If the patient continues to have chest discomfort, then I would  assume that it is from his small vessel disease.  Long-acting  nitroglycerin could be considered in the future.  I do not see any other  medication additions that should be considered at this time.  The  patient will continue taking his aspirin and Plavix as well as his beta-  blocker and statin therapy.      Verne Carrow, MD  Electronically Signed     CM/MEDQ  D:  04/23/2009  T:  04/24/2009  Job:  [id number]   cc:   Lyn Hollingshead MD [place]

## 2011-04-06 NOTE — Consult Note (Signed)
[name], [name]:  [id number]   MEDICAL RECORD NO.:  [id number]          PATIENT TYPE:  OBV   LOCATION:  3707                         FACILITY:  [facility]   PHYSICIAN:  Madolyn Frieze. Jens Som, MD, FACCDATE OF BIRTH:  1956/06/09   DATE OF CONSULTATION:  05/09/2009  DATE OF DISCHARGE:                                 CONSULTATION   ADDENDUM    Initially upon evaluation, he was pain free.  A D-dimer was checked and  was within normal limits.  Cardiac enzymes were rechecked at 8 hours  after onset of pain and these were also within normal limits.  However,  he had recurrent chest pain that did not resolve despite Ativan,  Vicodin, and Toradol.  Dr. Jens Som reviewed the data and felt that the  best option was to admit [name] overnight to continue to cycle  enzymes and obtain pain control.  The situation was discussed with Mr.  [name] and his wife and they are in agreement with this as a plan of  care.  He will be continued on his home medications with the addition of  Toradol IV for 24 hours with the plan to change him to an oral  nonsteroidal in the morning.  We will also add hydrocodone 5 mg for  additional pain control.  Consideration is given to reflux as a cause of  his symptoms; however, the patient states that when he had problems with  gastroesophageal reflux disease in the past, he had water brush,  frequent belching, and some indigestion.  He has not had any of those  symptoms recently.      Theodore Demark, PA-C      Madolyn Frieze. Jens Som, MD, [facility]  Electronically Signed    RB/MEDQ  D:  05/09/2009  T:  05/10/2009  Job:  [id number]

## 2011-04-06 NOTE — Discharge Summary (Signed)
NAMESHJON, LIZARRAGA NO.:  [id number]   MEDICAL RECORD NO.:  [id number]          PATIENT TYPE:  INP   LOCATION:  4707                         [facility]ACILITY[facility]   PHYSICIAN:  Verne Carrow, MDDATE OF BIRTH:  [birth year]   DATE OF ADMISSION:  04/22/2009  DATE OF DISCHARGE:  04/24/2009                               DISCHARGE SUMMARY   ADDENDUM   The patient had mild oozing at the cath site and therefore he was kept  overnight for observation.  He was seen by Dr. Verne Carrow in  the morning of April 24, 2009.  Oozing had resolved.  The patient was in  good condition and deemed stable for discharge.  No changes made to the  plan from previous dictation.      Jarrett Ables, [facility]      Verne Carrow, MD  Electronically Signed    MS/MEDQ  D:  04/24/2009  T:  04/24/2009  Job:  [id number]

## 2011-04-06 NOTE — Discharge Summary (Signed)
[name], [name] NO.:  [id number]   MEDICAL RECORD NO.:  [id number]          PATIENT TYPE:  OBV   LOCATION:  3707                         [facility]ACILITY[facility]   PHYSICIAN:  Pricilla Riffle, MD, FACCDATE OF BIRTH:  July 01, 1956   DATE OF ADMISSION:  05/09/2009  DATE OF DISCHARGE:  05/10/2009                               DISCHARGE SUMMARY   PROCEDURES:  None.   PRIMARY FINAL DISCHARGE DIAGNOSIS:  Chest pain, noncritical coronary  artery disease at cath on April 23, 2009, and medical therapy recommended,  possibly musculoskeletal or related to stress.   SECONDARY DIAGNOSES:  1. Status post drug-eluting stents to the right coronary artery and      posterior descending artery on March 14, 2009, at [facility].  2. Morbid obesity.  3. Hypertension.  4. Hyperlipidemia.  5. Diabetes.  6. History of Barrett esophagus.  7. Remote history of folliculitis.   TIME AT DISCHARGE:  39 minutes.   HOSPITAL COURSE:  Mr. Mcclure is a 55 year old male with a history of  coronary artery disease.  He had stents in April, and came to the  hospital with chest pain early in June where cath showed noncritical  disease.  He had chest pain on the day of admission and was brought to  the hospital where he was admitted for further evaluation.   His chest wall was tender to palpation.  His chest pain responded to  nonsteroidal anti-inflammatory medications for possible musculoskeletal  pain and benzodiazepines for anxiety.  Initially, his pain resolved, but  then recurred, so he was admitted overnight.   Repeat cardiac enzymes are pending, but his symptoms have resolved.  He  has had nonsteroidals added to his medication regimen and is to continue  on his home dose of Xanax.  He was evaluated by Dr. Tenny Craw on May 10, 2009, and considered stable for discharge as long as repeat enzymes are  negative.   DISCHARGE INSTRUCTIONS:  1. His activity level is to be as  tolerated.  2. He is to stick to a low-fat diabetic diet.  3. He is to follow up with Dr. Darrold Junker as scheduled on Wednesday and      with Dr. Terance Hart as well.   DISCHARGE MEDICATIONS:  1. Glipizide 10 mg b.i.d.  2. Metformin 850 mg b.i.d.  3. Aspirin as prior to admission.  4. Lipitor 40 mg a day.  5. Lisinopril and hydrochlorothiazide 20/25 mg daily.  6. Metoprolol extended release 25 mg daily.  7. He is not to take metoprolol tartrate or Lopressor.  8. Motrin 600 mg b.i.d. for a week.  9. Xanax 0.25 mg p.r.n.      Theodore Demark, PA-C      Pricilla Riffle, MD, [facility]  Electronically Signed    RB/MEDQ  D:  05/10/2009  T:  05/10/2009  Job:  [id number]   cc:   Marcina Millard, MD  Dorothey Baseman

## 2011-04-06 NOTE — Consult Note (Signed)
[name], [name] NO.:  [id number]   MEDICAL RECORD NO.:  [id number]          PATIENT TYPE:  OBV   LOCATION:  3707                         FACILITY:  [facility]   PHYSICIAN:  Madolyn Frieze. Jens Som, MD, FACCDATE OF BIRTH:  15-Jun-1956   DATE OF CONSULTATION:  05/09/2009  DATE OF DISCHARGE:                                 CONSULTATION   PRIMARY CARE PHYSICIAN:  Teena Irani. Terance Hart, MD at [facility] in  [place], [place].   PRIMARY CARDIOLOGIST:  Marcina Millard, MD with [facility].   CHIEF COMPLAINT:  Chest pain.   HISTORY OF PRESENT ILLNESS:  [name] is a 55 year old male with  known coronary artery disease.  Because he works in [place], he was  taken to the closest hospital early in June when he had chest pain.  Cardiac catheterization showed noncritical coronary artery disease and  medical therapy was recommended.  Since then, he has done well.   Today, he was at work and was standing, but no other significant  exertion except for some emotional stress which she states is a normal  part of his job and which does not normally give him chest pain.  At  approximately 8 a.m., he had sudden onset of left-sided chest pain that  reached 7/10.  It was associated with shortness of breath, nausea,  described as a knot in the stomach and diaphoresis.  He took  sublingual nitroglycerin twice, 10 minutes apart with no relief.  He  called his wife who came and picked him up and took him to the closest  hospital which was [facility].  He also stated that he became extremely  upset because of the chest pain.  En route to the ER, his chest pain  resolved.  He stated that his chest pain was worse with deep inspiration  and possibly also worse with coughing.  He had a dry cough.  He denies  fever or chills.  Currently in the emergency room, he is resting  comfortably.   PAST MEDICAL HISTORY:  1. Status post drug eluting stents to the RCA and PDA on  March 14, 2009 at [facility].  2. Status post cardiac catheterization at [facility] on April 23, 2009      showing left main okay, LAD 30%, diagonal 140%, small circumflex      70% (too small for PCI), RCA 30%, RCA and PDA stents widely patent      and EF 55%.  3. Diabetes.  4. Hypertension.  5. Hyperlipidemia.  6. Morbid obesity with a body mass index of greater than 40.  7. History of Barrett esophagus.  8. History of folliculitis.   PAST SURGICAL HISTORY:  He is status post cardiac catheterization x2 as  well as EGD, colonoscopy, and removal of an area of folliculitis on his  back requiring skin grafting.   ALLERGIES:  No known drug allergies.   CURRENT MEDICATIONS:  1. Glipizide 10 mg b.i.d.  2. Metformin 850 b.i.d.  3. Aspirin daily.  4. Lipitor 40 mg a day.  5.  Lisinopril and hydrochlorothiazide 20/25 mg daily.  6. Lopressor 25 mg daily.  7. Xanax 0.25 mg b.i.d. p.r.n.  8. Plavix 75 mg daily.   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  He has no history of alcohol, tobacco, or drug  abuse.   FAMILY HISTORY:  Both of his parents are alive in late 10s and early  46s.  Neither one has coronary artery disease and no siblings have heart  disease.   REVIEW OF SYSTEMS:  He has some chronic dyspnea on exertion and  orthopnea that has not changed recently.  He has had a dry cough  recently and his wife states he coughs every morning.  He does not  generally experience reflux symptoms and denies melena or hematemesis.  He has occasional arthralgias.  Full 14 for review of systems is  otherwise negative.   PHYSICAL EXAMINATION:  VITAL SIGNS:  Temperature is 97.6, blood pressure  111/77, pulse 70, respiratory rate 18, O2 saturation 100% on room air.  GENERAL:  He is a well-developed obese white male in no acute distress.  HEENT:  Normal.  NECK:  There is no lymphadenopathy, thyromegaly, bruit, or JVD  noted.  CVA:  His heart is regular in rate and rhythm with an S1 and S2 and no  significant murmur, rub, or gallop is noted.  Distal pulses are intact  in all four extremities with no femoral bruits and the cath site in his  right groin is well healed.  LUNGS:  He has slightly decreased breath sounds in the right, but this  is questionably secondary to body habitus and his lungs are otherwise  clear.  SKIN:  No rashes or lesions are noted, but he has a possible lipoma on  his back.  ABDOMEN:  Soft and he has active bowel sounds.  There is slight diffuse  upper abdominal tenderness across his entire belly.  EXTREMITIES:  There is no cyanosis, clubbing, or edema noted.  MUSCULOSKELETAL:  There is no joint deformity or effusion and no spine  or CVA tenderness.  He has chest wall tenderness in the left lower  sternal border.  NEUROLOGIC:  He is alert and oriented.  Cranial nerves II through XII  grossly intact.   Chest x-ray no acute disease.   EKG is sinus rhythm, rate 71 with no acute changes.   LABORATORY VALUES:  Point of care markers are negative x2.   IMPRESSION:  [name] was seen today by Dr. Jens Som.  He is a 55-  year-old male with a history of coronary artery disease, diabetes,  hypertension, hyperlipidemia, Barrett esophagus who is here with chest  pain.  He was cathed on April 23, 2009 and a 70% distal circumflex was  noted, but it is too small for PCI and was not considered flow limiting.  He had otherwise nonobstructive disease.  He developed chest pain this  a.m. that was under the left breast and radiated to the left mid  axillary area.  It increased with cough and inspiration and was  associated with diaphoresis.  He took sublingual nitroglycerin x2 and  his symptoms persisted.  He still has some mild pain and his chest wall  is tender with the duration of approximately 6 hours.  He has had 2 sets  of negative enzymes and his EKG is sinus rhythm with no ST  changes.  His  chest pain is reproducible with palpation.  The plan  will be to check a third set of cardiac enzymes and a D-dimer.  If these  are negative, he can be discharged home and follow up next week with Dr.  Darrold Junker as scheduled.  He should continue on aspirin, beta-blocker,  Plavix, and a statin as well as his other home medications.      Theodore Demark, PA-C      Madolyn Frieze. Jens Som, MD, [facility]  Electronically Signed    RB/MEDQ  D:  05/09/2009  T:  05/10/2009  Job:  [id number]

## 2011-04-06 NOTE — Discharge Summary (Signed)
[name], [name] NO.:  [id number]   MEDICAL RECORD NO.:  [id number]          PATIENT TYPE:  OBV   LOCATION:  3707                         FACILITY[facility]   PHYSICIAN:  Madolyn Frieze. Jens Som, MD, FACCDATE OF BIRTH:  Feb 28, 1956   DATE OF ADMISSION:  07/03/2009  DATE OF DISCHARGE:  07/04/2009                               DISCHARGE SUMMARY   PROCEDURES:  None.   PRIMARY FINAL DISCHARGE DIAGNOSIS:  Chest pain, reflux medication added  and medical therapy for coronary artery disease recommended.   SECONDARY DIAGNOSES:  1. Status post drug-eluting stents to the right coronary artery and      posterior descending coronary artery in April 2010 at [facility]  2. History of chest pain, admissions to [facility] on April 22, 2009,      and May 09, 2009.  3. Status post cardiac catheterization on April 23, 2009, showing left      anterior descending coronary artery 30%, diagonal one 40%,      circumflex 70%, small vessel and right coronary artery 30% with      patent stents.  4. Diabetes.  5. Hypertension.  6. Morbid obesity.  7. History of Barrett esophagus and gastroesophageal reflux disease.  8. History of folliculitis.  9. Status post esophagogastroduodenoscopy and colonoscopy as well as      skin grafting secondary to the folliculitis.   TIME AT DISCHARGE:  Thirty-two minutes.   HOSPITAL COURSE:  [name] is a 55 year old male with known coronary  artery disease.  He had chest pain on the day of admission and came to  the hospital where he was admitted for further evaluation.   His chest pain was atypical.  A GI cocktail was given in the emergency  room, and he was started on Protonix.  He was admitted overnight, and  his cardiac enzymes were cycled.   On July 04, 2009, [name]'s chest pain had resolved.  His cardiac  enzymes were negative for MI.  Dr. Jens Som felt that [name] was  stable for discharge with primary care and  Cardiology followup in  [place].   DISCHARGE INSTRUCTIONS:  1. His activity levels to be as tolerated.  2. He is to stick to a low-fat diabetic diet.  3. He is to follow up with primary care and Cardiology in [place].   DISCHARGE MEDICATIONS:  Per the computerized discharge med list.      Theodore Demark, PA-C      Madolyn Frieze. Jens Som, MD, [facility]  Electronically Signed    RB/MEDQ  D:  07/04/2009  T:  07/04/2009  Job:  [id number]   cc:   Dr. Robb Matar

## 2011-04-06 NOTE — Consult Note (Signed)
NAMENORBERT, MALKIN NO.:  [id number]   MEDICAL RECORD NO.:  [id number]          PATIENT TYPE:  OBV   LOCATION:  3707                         FACILITY[facility]   PHYSICIAN:  Madolyn Frieze. Jens Som, MD, FACCDATE OF BIRTH:  [birth year]   DATE OF CONSULTATION:  05/09/2009  DATE OF DISCHARGE:                                 CONSULTATION   PRIMARY CARE PHYSICIAN:  Dorothey Baseman in [facility] in  [place], [place].   PRIMARY CARDIOLOGIST:  Dr. Marcina Millard at [facility] in  [place], [place].   CHIEF COMPLAINT:  Chest pain.   HISTORY OF PRESENT ILLNESS:  Mr. Reagle is a 55 year old male with  known coronary artery disease.  He works in [place], so came to the  closest hospital when he had chest pain earlier in June 2010.  He was  cathed with noncritical coronary artery disease, medical therapy  recommended.   DICTATION ENDED AT THIS POINTS.      Theodore Demark, PA-C      Madolyn Frieze. Jens Som, MD, [facility]  Electronically Signed    RB/MEDQ  D:  05/09/2009  T:  05/10/2009  Job:  [id number]

## 2011-04-06 NOTE — H&P (Signed)
NAMEQUINTELL, BONNIN NO.:  [id number]   MEDICAL RECORD NO.:  [id number]          PATIENT TYPE:  OBV   LOCATION:  3707                         FACILITY[facility]   PHYSICIAN:  Madolyn Frieze. Jens Som, MD, FACCDATE OF BIRTH:  1956/09/17   DATE OF ADMISSION:  07/03/2009  DATE OF DISCHARGE:                              HISTORY & PHYSICAL   PRIMARY CARE PHYSICIAN:  Dorothey Baseman, MD, [facility]   PRIMARY CARDIOLOGIST:  Marcina Millard, MD, [facility]   CHIEF COMPLAINT:  Chest pain.   HISTORY OF PRESENT ILLNESS:  Mr. Voorheis is a 55 year old male with a  history of coronary artery disease.  He was in his usual state of health  today and was at his work when he had onset of substernal chest  pain/epigastric pain.  It reached to 7/10.  He tried sublingual  nitroglycerin x2 and the symptoms were relieved.  The symptoms returned  and he called his cardiologist who recommended that he call 911, which  he did.  EMS gave him a third sublingual nitroglycerin which also helped  his pain.  In the emergency room, he received morphine for pain and  Phenergan for nausea.  Cardiology was asked to evaluate him.  The  morphine and Phenergan relieved his pain but it has returned and it is  currently 2/10.   This is the first episode that Mr. Hewitt has had since his discharge  on May 10, 2009.  He has been going to Cardiac Rehab and doing well  with that.  He has not had any exertional symptoms or any stress related  symptoms.  Today, the chest pain started without him being under any  kind of physical or emotional stress.  Although, he still does complain  of some mild chest pain, he appears to be resting comfortably.   PAST MEDICAL HISTORY:  1. Status post drug-eluting stents to the RCA and PDA in April 2010 at      [facility].  2. History of admissions for chest pain to [facility] on June 1      through June, 2, 2010 and June 18 through May 10, 2009.  3.  Status post cardiac catheterization on April 23, 2009 showing LAD      30%, first diagonal 40%, circumflex 70% (small vessel), RCA 30%,      stent patent, and EF 55%.  4. Diabetes.  5. Hypertension.  6. Hyperlipidemia.  7. Morbid obesity.  8. History of Barrett esophagus and gastroesophageal reflux disease.  9. Remote history of folliculitis on his back.   SURGICAL HISTORY:  He is status post cardiac catheterization x2 as well  as EGD, colonoscopy, and removal of an area of folliculitis on his back  with a skin graft.   ALLERGIES:  No known drug allergies.   CURRENT MEDICATIONS:  1. Glipizide 10 mg b.i.d.  2. Metformin 850 mg b.i.d.  3. Aspirin 325 mg daily.  4. Lipitor 40 mg a day.  5. Lisinopril and hydrochlorothiazide 20/25 daily.  6. Metoprolol succinate 25 mg daily.  7. Xanax 0.25 mg p.r.n.  8. Nitroglycerin  sublingual p.r.n.   SOCIAL HISTORY:  He lives in [place] with his wife.  He works at [employer]  as a Engineer, building services.  He has no history of alcohol, tobacco, or  drug abuse.   FAMILY HISTORY:  Both of his parents are alive, in their late 29s or  early 86s.  Neither one has coronary artery disease and no siblings have  heart disease.   REVIEW OF SYSTEMS:  He has the head of the bed raised to avoid reflux  symptoms and denies them, however, he coughs every morning.  He is  complaining of some abdominal discomfort at this time but is not  currently having any nausea.  He has chronic dyspnea on exertion that  has not changed recently.  He has some chronic arthralgias and some  anxiety.  Full 14-point review of systems is otherwise negative.   PHYSICAL EXAMINATION:  VITAL SIGNS:  Temperature is 97.4, blood pressure  (after nitroglycerin) 98/53, pulse 67, respiratory rate 13, and O2  saturation 98% on 2 L.  GENERAL:  He is a well-developed obese white male in no acute distress.  HEENT:  Normal.  NECK:  There is no lymphadenopathy, thyromegaly, bruit, or JVD noted.   CV:  His heart is regular in rate and rhythm with an S1-S2 and no  significant murmur, rub, or gallop is noted.  Distal pulses are intact  in all 4 extremities.  LUNGS:  Clear to auscultation bilaterally.  SKIN:  No rashes or lesions are noted.  ABDOMEN:  Soft and has active bowel sounds.  Slightly tender in the mid  epigastric and left side.  However, there is no rebound or guarding.  EXTREMITIES:  There is no cyanosis, clubbing, or edema noted.  MUSCULOSKELETAL:  There is no joint deformity or effusion and no spine  or CVA tenderness.  NEUROLOGIC:  He is alert and oriented.  Cranial nerves II-XII grossly  intact.   IMAGING:  Chest x-ray:  No acute disease.   EKG:  Sinus rhythm rate 72 with no acute changes.   LABORATORY VALUES:  Cardiac markers negative x1.  Hemoglobin 13.6,  hematocrit 39.5, WBC 5.1, and platelets 138.  Sodium 140, potassium 4.3,  chloride 109, CO2 24, BUN 13, creatinine 1.2, and glucose 105.   IMPRESSION:  Mr. Bloodgood is a 55 year old male with a past medical  history of coronary artery disease, diabetes, hypertension,  hyperlipidemia, reflux, and chest pain.  He had a cath on April 23, 2009  for which medical therapy was recommended.  He developed substernal  chest pain at about 8:00 a.m. and described it as a pressure.  He had  some shortness of breath with it but no nausea or vomiting or  diaphoresis.  It is not pleuritic and not positional.  He has had no  water brash.  The pain improved with sublingual nitroglycerin and then  returned.  He feels it is currently mild.  His EKG shows sinus rhythm  with no ST changes.  Initial enzymes were negative.  His chest pain is  atypical and he has no history of exertional symptoms.  We will admit  him and rule out myocardial infarction.  If his cardiac enzymes are  negative, no further cardiac workup is indicated given his recent  catheterization.  He is to follow up in [place].  We will try a  gastrointestinal  cocktail in the emergency room and  add Protonix to his medication regimen with his history of Barrett  esophagus and  reflux.  Dr. Jens Som discussed the need to reassess his  Barrett esophagus as he has not had an EGD or colonoscopy in 10 years.  He is to follow up with his primary care for this.      Theodore Demark, PA-C      Madolyn Frieze. Jens Som, MD, [facility]  Electronically Signed    RB/MEDQ  D:  07/03/2009  T:  07/03/2009  Job:  [id number]   cc:   Lolita Rieger, M.D.

## 2011-05-19 IMAGING — CR DG CHEST 2V
1 series · 1 of 1 positions shown · non-contrast
Comparison: [DATE]

CLINICAL DATA: Chest pain on the left.  Right arm and shoulder
pain.  Diabetes.  Coronary artery disease.

CHEST - 2 VIEW

[view not recorded]
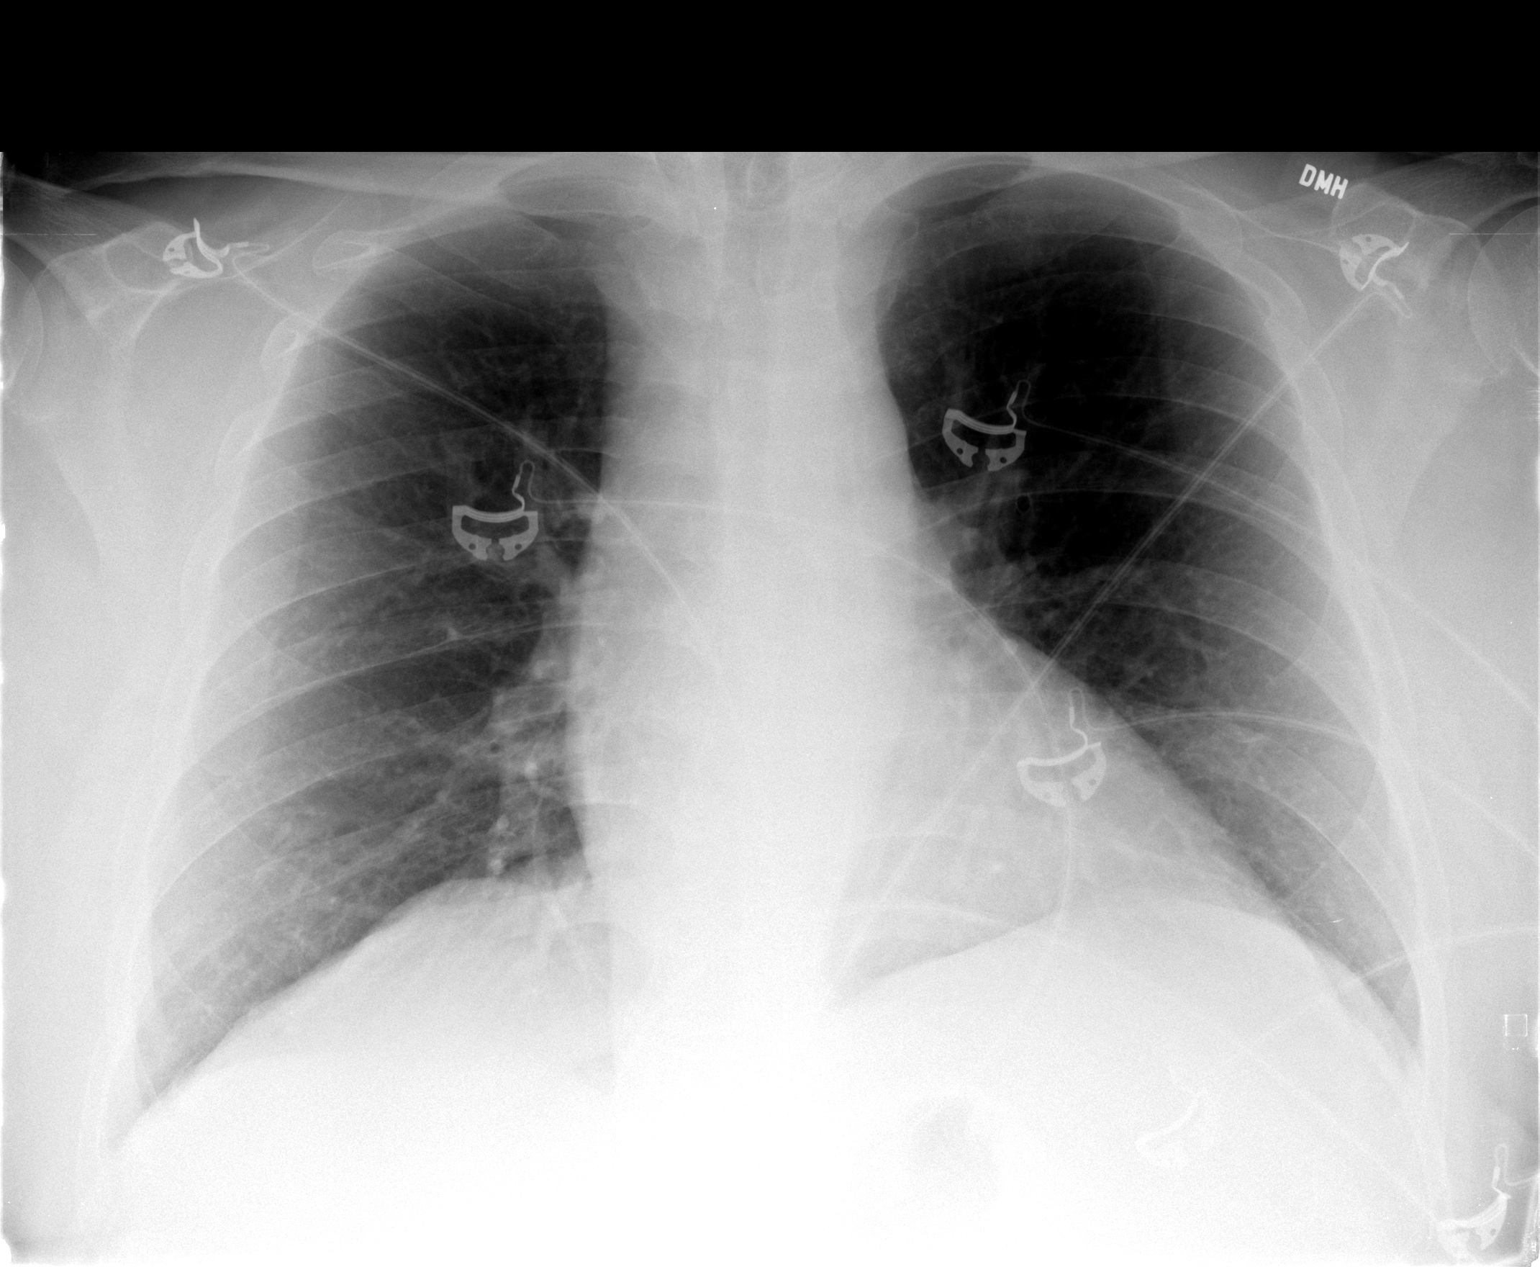

[1 of 1 positions shown; findings below may reference images not displayed]

FINDINGS: Artifact overlies the chest.  Heart size is normal.  The
vascularity is normal.  Lungs are clear.  No effusions.
IMPRESSION: No active disease

## 2011-08-22 ENCOUNTER — Other Ambulatory Visit: Payer: Self-pay | Admitting: Cardiology

## 2011-10-11 ENCOUNTER — Inpatient Hospital Stay: Payer: Self-pay | Admitting: Cardiology

## 2012-07-29 ENCOUNTER — Other Ambulatory Visit: Payer: Self-pay | Admitting: Cardiology

## 2012-08-25 ENCOUNTER — Observation Stay: Payer: Self-pay | Admitting: Internal Medicine

## 2012-08-25 LAB — URINALYSIS, COMPLETE
Bacteria: NONE SEEN
Glucose,UR: NEGATIVE mg/dL (ref 0–75)
Hyaline Cast: 3
Leukocyte Esterase: NEGATIVE
Nitrite: NEGATIVE
Protein: NEGATIVE
RBC,UR: <1 /HPF (ref 0–5)
Specific Gravity: 1.028 (ref 1.003–1.030)
WBC UR: 1 /HPF (ref 0–5)

## 2012-08-25 LAB — COMPREHENSIVE METABOLIC PANEL
Albumin: 3.8 g/dL (ref 3.4–5.0)
Anion Gap: 8 (ref 7–16)
BUN: 17 mg/dL (ref 7–18)
Bilirubin,Total: 1.1 mg/dL — ABNORMAL HIGH (ref 0.2–1.0)
Chloride: 107 mmol/L (ref 98–107)
Creatinine: 1.32 mg/dL — ABNORMAL HIGH (ref 0.60–1.30)
EGFR (African American): >60
Glucose: 99 mg/dL (ref 65–99)
Potassium: 4.5 mmol/L (ref 3.5–5.1)
Sodium: 140 mmol/L (ref 136–145)
Total Protein: 6.3 g/dL — ABNORMAL LOW (ref 6.4–8.2)

## 2012-08-25 LAB — CBC
MCH: 30 pg (ref 26.0–34.0)
Platelet: 109 10*3/uL — ABNORMAL LOW (ref 150–440)
RBC: 4.89 10*6/uL (ref 4.40–5.90)
RDW: 13.8 % (ref 11.5–14.5)
WBC: 8.3 10*3/uL (ref 3.8–10.6)

## 2012-08-25 LAB — CK TOTAL AND CKMB (NOT AT ARMC)
CK, Total: 96 U/L (ref 35–232)
CK-MB: 0.9 ng/mL (ref 0.5–3.6)
CK-MB: 0.9 ng/mL (ref 0.5–3.6)

## 2012-08-25 LAB — TROPONIN I: Troponin-I: <0.02 ng/mL

## 2012-08-25 LAB — APTT: Activated PTT: 101.1 secs — ABNORMAL HIGH (ref 23.6–35.9)

## 2012-08-26 LAB — CK TOTAL AND CKMB (NOT AT ARMC): CK-MB: 0.7 ng/mL (ref 0.5–3.6)

## 2012-08-26 LAB — APTT: Activated PTT: 99.6 secs — ABNORMAL HIGH (ref 23.6–35.9)

## 2012-11-22 HISTORY — PX: CORONARY ARTERY BYPASS GRAFT: SHX141

## 2013-02-01 ENCOUNTER — Ambulatory Visit: Payer: Self-pay | Admitting: Cardiology

## 2013-02-14 DIAGNOSIS — I10 Essential (primary) hypertension: Secondary | ICD-10-CM | POA: Diagnosis present

## 2013-03-20 ENCOUNTER — Encounter: Payer: Self-pay | Admitting: Cardiothoracic Surgery

## 2013-03-22 ENCOUNTER — Encounter: Payer: Self-pay | Admitting: Cardiothoracic Surgery

## 2013-04-22 ENCOUNTER — Encounter: Payer: Self-pay | Admitting: Cardiothoracic Surgery

## 2013-08-23 ENCOUNTER — Ambulatory Visit: Payer: Self-pay | Admitting: Gastroenterology

## 2013-08-24 LAB — PATHOLOGY REPORT

## 2014-05-02 DIAGNOSIS — G4733 Obstructive sleep apnea (adult) (pediatric): Secondary | ICD-10-CM | POA: Diagnosis present

## 2014-09-04 DIAGNOSIS — N401 Enlarged prostate with lower urinary tract symptoms: Secondary | ICD-10-CM | POA: Insufficient documentation

## 2014-09-25 ENCOUNTER — Ambulatory Visit: Payer: Self-pay | Admitting: Otolaryngology

## 2014-10-04 ENCOUNTER — Ambulatory Visit: Payer: Self-pay | Admitting: Otolaryngology

## 2015-01-28 ENCOUNTER — Encounter: Payer: Self-pay | Admitting: Cardiovascular Disease

## 2015-01-28 ENCOUNTER — Ambulatory Visit (INDEPENDENT_AMBULATORY_CARE_PROVIDER_SITE_OTHER): Payer: BLUE CROSS/BLUE SHIELD | Admitting: Cardiovascular Disease

## 2015-01-28 ENCOUNTER — Encounter (INDEPENDENT_AMBULATORY_CARE_PROVIDER_SITE_OTHER): Payer: Self-pay

## 2015-01-28 VITALS — BP 100/72 | HR 77 | Ht 70.0 in | Wt 290.2 lb

## 2015-01-28 DIAGNOSIS — I951 Orthostatic hypotension: Secondary | ICD-10-CM

## 2015-01-28 DIAGNOSIS — R0602 Shortness of breath: Secondary | ICD-10-CM

## 2015-01-28 DIAGNOSIS — E1159 Type 2 diabetes mellitus with other circulatory complications: Secondary | ICD-10-CM

## 2015-01-28 DIAGNOSIS — E785 Hyperlipidemia, unspecified: Secondary | ICD-10-CM

## 2015-01-28 DIAGNOSIS — Z951 Presence of aortocoronary bypass graft: Secondary | ICD-10-CM | POA: Insufficient documentation

## 2015-01-28 DIAGNOSIS — R0789 Other chest pain: Secondary | ICD-10-CM

## 2015-01-28 NOTE — Assessment & Plan Note (Signed)
Cholesterol is at goal on the current lipid regimen. No changes to the medications were made.  

## 2015-01-28 NOTE — Assessment & Plan Note (Signed)
Etiology unclear, chronic issue. Recommended we start Ranexa 500 mg twice a day with titration up to 1000 mg twice a day for small vessel disease We did offer stress testing or catheterization if symptoms persist or get worse. He will think about this and try the medication above

## 2015-01-28 NOTE — Assessment & Plan Note (Signed)
If symptoms get worse, will order echocardiogram. Appears dehydrated on today's visit, less likely diastolic CHF I suspect he is very deconditioned. Cardiac rehabilitation ordered

## 2015-01-28 NOTE — Progress Notes (Signed)
Patient ID: [name], male    DOB: 07/27/1956, 59 y.o.   MRN: [id number]  HPI Comments: [name] is a 59 year old gentleman with obesity, coronary artery disease, stenting to his RCA and circumflex in 2010, bypass surgery March 2014 with vein graft to the OM, LIMA to the LAD, hyperlipidemia, obstructive sleep apnea with compliance of his CPAP, chronic left-sided chest pain, worsening shortness of breath who presents to establish care in the [place] office Notes indicate history of diabetes, esophageal spasm, bulging disc in his neck, GERD  He reports that he is unable to walk or do very much secondary to chest discomfort and shortness of breath. Previously used to be very active in his yard, now unable to do very much. Does not do any regular exercise. Weight is a major issue. He does use Advair and albuterol on a regular basis. He is on disability for his exercise intolerance He has been taking Lasix daily. Reports having leg swelling on a regular basis. Recent lightheaded spells getting out of the car and when he stands up. Does not check his blood pressure at home  Recent lab work reviewed with him showing total cholesterol 144, LDL 80 EKG on today's visit shows no sinus rhythm with rate 77 bpm, no significant ST or T-wave changes   No Known Allergies  Outpatient Encounter Prescriptions as of 01/28/2015  Medication Sig  . acetaminophen (TYLENOL) 325 MG tablet Take 650 mg by mouth every 6 (six) hours as needed.  Marland Kitchen. albuterol (PROVENTIL HFA;VENTOLIN HFA) 108 (90 BASE) MCG/ACT inhaler Inhale into the lungs every 6 (six) hours as needed for wheezing or shortness of breath.  . ALPRAZolam (XANAX) 0.5 MG tablet Take 1/2 tablet in the am with 1 tablet in the pm.  . aspirin 81 MG tablet Take 81 mg by mouth daily.  Marland Kitchen. atorvastatin (LIPITOR) 40 MG tablet Take 40 mg by mouth daily.  Marland Kitchen. buPROPion (WELLBUTRIN SR) 150 MG 12 hr tablet Take 150 mg by mouth 2 (two) times daily.  . clopidogrel  (PLAVIX) 75 MG tablet Take 75 mg by mouth daily.  . fluticasone-salmeterol (ADVAIR HFA) 115-21 MCG/ACT inhaler Inhale 2 puffs into the lungs 2 (two) times daily.  . furosemide (LASIX) 20 MG tablet Take 20 mg by mouth.  . gabapentin (NEURONTIN) 300 MG capsule Take 300 mg by mouth 3 (three) times daily.  Marland Kitchen. ibuprofen (ADVIL,MOTRIN) 200 MG tablet Take 200 mg by mouth every 6 (six) hours as needed.  . metFORMIN (GLUCOPHAGE) 850 MG tablet Take 850 mg by mouth daily with breakfast.   . metoprolol tartrate (LOPRESSOR) 25 MG tablet Take 12.5 mg by mouth 2 (two) times daily.  . nitroGLYCERIN (NITROSTAT) 0.4 MG SL tablet Place 0.4 mg under the tongue every 5 (five) minutes as needed for chest pain.  . potassium chloride SA (K-DUR,KLOR-CON) 20 MEQ tablet Take 20 mEq by mouth daily.   . RABEprazole (ACIPHEX) 20 MG tablet Take 20 mg by mouth 2 (two) times daily.   . ranitidine (ZANTAC) 150 MG capsule Take 150 mg by mouth 2 (two) times daily.  Marland Kitchen. senna (SENOKOT) 8.6 MG tablet Take 1 tablet by mouth daily.  Marland Kitchen. venlafaxine (EFFEXOR) 75 MG tablet Take 75 mg by mouth 2 (two) times daily.  . [DISCONTINUED] metoprolol succinate (TOPROL-XL) 25 MG 24 hr tablet TAKE 1 TABLET BY MOUTH DAILY (Patient not taking: Reported on 01/28/2015)    Past Medical History  Diagnosis Date  . Type 2 diabetes mellitus   .  Coronary artery disease   . GERD (gastroesophageal reflux disease)   . Hyperlipidemia   . Hypertension   . Atherosclerosis of coronary artery bypass graft with angina pectoris   . Sleep apnea   . Barrett's esophagus   . Anxiety   . H/O esophageal spasm   . Chronic constipation   . Colon polyp   . MI (myocardial infarction)     Past Surgical History  Procedure Laterality Date  . Tee with cardioversion    . Anal fissure repair    . Abscess cellulitis resection     . Colonoscopy    . Coronary artery bypass graft  2014    CABG x 2  . Cardiac catheterization  2011  . Coronary angioplasty with stent  placement  2011    stent placement x 3 @ [facility]; Dr. Darrold Junker    Social History  reports that he has never smoked. He does not have any smokeless tobacco history on file. He reports that he does not drink alcohol or use illicit drugs.  Family History family history includes Hyperlipidemia in his father and mother; Hypertension in his father and mother.  Review of Systems  Constitutional: Negative.   HENT: Negative.   Eyes: Negative.   Respiratory: Positive for chest tightness and shortness of breath.   Cardiovascular: Positive for chest pain.  Gastrointestinal: Negative.   Musculoskeletal: Negative.   Skin: Negative.   Allergic/Immunologic: Negative.   Neurological: Negative.   Hematological: Negative.   Psychiatric/Behavioral: Negative.   All other systems reviewed and are negative.   BP 100/72 mmHg  Pulse 77  Ht  (1.778 m)  Wt 290 lb 4 oz (131.657 kg)  BMI 41.65 kg/m2  Physical Exam  Constitutional: He is oriented to person, place, and time. He appears well-developed and well-nourished.  HENT:  Head: Normocephalic.  Nose: Nose normal.  Mouth/Throat: Oropharynx is clear and moist.  Eyes: Conjunctivae are normal. Pupils are equal, round, and reactive to light.  Neck: Normal range of motion. Neck supple. No JVD present.  Cardiovascular: Normal rate, regular rhythm, S1 normal, S2 normal, normal heart sounds and intact distal pulses.  Exam reveals no gallop and no friction rub.   No murmur heard. Pulmonary/Chest: Effort normal and breath sounds normal. No respiratory distress. He has no wheezes. He has no rales. He exhibits no tenderness.  Abdominal: Soft. Bowel sounds are normal. He exhibits no distension. There is no tenderness.  Musculoskeletal: Normal range of motion. He exhibits no edema or tenderness.  Lymphadenopathy:    He has no cervical adenopathy.  Neurological: He is alert and oriented to person, place, and time. Coordination normal.  Skin: Skin is warm  and dry. No rash noted. No erythema.  Psychiatric: He has a normal mood and affect. His behavior is normal. Judgment and thought content normal.      Assessment and Plan   Nursing note and vitals reviewed.

## 2015-01-28 NOTE — Assessment & Plan Note (Signed)
Bypass surgery details as above. We have offered workup such as stress testing or catheterization if symptoms get worse. We have recommended cardiac rehabilitation given her shortness of breath and deconditioning, continue chest pain

## 2015-01-28 NOTE — Assessment & Plan Note (Signed)
Blood pressure is low today. Suspect he is mildly dehydrated. Recommended he stop his Lasix and potassium, take this every other day, increase his fluids. He is having symptoms of orthostasis Systolic pressure 92 sitting on my check

## 2015-01-28 NOTE — Assessment & Plan Note (Signed)
Hemoglobin A1c above goal. Recommended weight loss, strict diet. Dietary guide given to him today

## 2015-01-28 NOTE — Assessment & Plan Note (Signed)
We have encouraged continued exercise, careful diet management in an effort to lose weight. 

## 2015-01-28 NOTE — Patient Instructions (Signed)
You are doing well.  Labs look like dehydration, creatinine is elevated 1.5, normal is 1.3 or less Drink more fluids,  Decrease lasix and potassium to every other day Try compression hose for vein swelling  We will put an order in for cardiac rehab, shortness of breath and chest pain  Please start ranexa 500 mg twice a day for one week, Then 1000 mg twice a  Day  Please call us if you have new issues that need to be addressed before your next appt.  Your physician wants you to follow-up in: 6 months.  You will receive a reminder letter in the mail two months in advance. If you don't receive a letter, please call our office to schedule the follow-up appointment.

## 2015-02-05 ENCOUNTER — Telehealth: Payer: Self-pay

## 2015-02-05 NOTE — Telephone Encounter (Signed)
[facility] Please call.

## 2015-02-05 NOTE — Telephone Encounter (Signed)
Spoke w/ pt.  He wanted to make sure that ins would cover his cardiac rehab.  Advised him that I send his info to rehab and they will verify coverage before contacting him. He is appreciative and will call back w/ any questions or concerns.

## 2015-02-18 ENCOUNTER — Encounter: Payer: Self-pay | Admitting: Cardiovascular Disease

## 2015-02-19 ENCOUNTER — Telehealth: Payer: Self-pay | Admitting: *Deleted

## 2015-02-19 ENCOUNTER — Other Ambulatory Visit: Payer: Self-pay

## 2015-02-19 MED ORDER — RANOLAZINE ER 1000 MG PO TB12: 1000.0000 mg | ORAL_TABLET | Freq: Two times a day (BID) | ORAL | Status: DC

## 2015-02-19 NOTE — Telephone Encounter (Signed)
Pt required PA for Ranexa 1000 mg tablet bid.  [phone] ; awaiting approval

## 2015-02-20 NOTE — Telephone Encounter (Signed)
Pt approved for RANEXA 1000 MG 02/19/2015-11/21/2038

## 2015-02-25 ENCOUNTER — Encounter
Admit: 2015-02-25 | Disposition: A | Discharge status: Home / Self Care | Payer: Self-pay | Attending: Cardiovascular Disease | Admitting: Cardiovascular Disease

## 2015-03-11 NOTE — H&P (Signed)
PATIENT NAME:  [name], [name] MR#:  [id number] DATE OF BIRTH:  Dec 18, 1955  DATE OF ADMISSION:  08/25/2012  PRIMARY CARE PHYSICIAN: Dr. Dorothey Baseman  CARDIOLOGIST: Dr. Marcina Millard  CHIEF COMPLAINT: Chest pain.   HISTORY OF PRESENT ILLNESS: This is a 59 year old male who presents to the Emergency Room due to worsening chest pain that began late last night. Patient said he developed some chest pain/pressure around 9:30 last night. It was associated with some shortness of breath, some nausea but no vomiting. He also became a bit diaphoretic with it. He was able to eventually fall asleep but then woke up around 1:00 a.m. with similar symptoms and fell back asleep and went to work this morning. At work on minimal exertion his symptoms recurred. He does have a history of coronary artery disease and multiple stent placements and therefore he was a bit concerned and came to the ER for further evaluation. In the Emergency Room patient still continued to have some chest pain/pressure. His cardiac markers are negative. His EKG does not show any acute ST or T wave changes but given his worsening and crescendo symptoms hospitalist services were contacted for further treatment and evaluation.   REVIEW OF SYSTEMS: CONSTITUTIONAL: No documented fever. No weight gain. No weight loss. EYES: No blurred or double vision. ENT: No tinnitus. No postnasal drip. No redness to the oropharynx. RESPIRATORY: No cough, no wheeze, no hemoptysis. Positive dyspnea. CARDIOVASCULAR: Positive chest pain. No orthopnea, no palpitations, no syncope. GASTROINTESTINAL: Positive nausea. No vomiting, no diarrhea, no abdominal pain, no melena, no hematochezia. GENITOURINARY: No dysuria, no hematuria. ENDOCRINE: No polyuria or nocturia. No heat or nitrate cold intolerance. HEME: No anemia, no bruising, no bleeding. INTEGUMENTARY: No rashes. No lesions. MUSCULOSKELETAL: No arthritis, no swelling, no gout. NEUROLOGIC: No numbness, no  tingling, no ataxia, no seizure-type activity. PSYCH: No anxiety, no insomnia, no ADD.   PAST MEDICAL HISTORY:  1. Diabetes.  2. Hypertension.  3. Hyperlipidemia.  4. History of coronary artery disease, status post stent placement. 5. Osteoarthritis. 6. Anxiety/depression. 7. Gastroesophageal reflux disease.   ALLERGIES: No known drug allergies.   SOCIAL HISTORY: No smoking. No alcohol abuse. No illicit drug abuse. Lives at home with his wife.   FAMILY HISTORY: His father died from complications of chronic obstructive pulmonary disease. Diabetes runs on his mother's side of the family. His mother is alive and healthy. Also cancer runs in the family. His maternal grandfather died from prostate cancer.   CURRENT MEDICATIONS:  1. Aspirin 81 mg daily.  2. Atorvastatin 40 mg at bedtime.  3. Celebrex 200 mg daily.  4. Plavix 75 mg daily.  5. Imdur 30 mg daily.  6. Lisinopril 5 mg daily.  7. Metformin 850 mg b.i.d.  8. Toprol 25 mg daily.  9. Sublingual nitroglycerin as needed. 10. NuLev 0.125 mg tablet every 4 to 6 hours as needed.  11. Zoloft 25 mg at bedtime.  12. Sucralfate 1 gram q.i.d.  13. Xanax 0.5 mg at bedtime.  14. Zantac 150 mg b.i.d.   PHYSICAL EXAMINATION:  VITAL SIGNS: Temperature 97.6, pulse 64, respirations 18, blood pressure 120/78, sats 98% on 2 liters nasal cannula.   GENERAL: He is a pleasant appearing male in no apparent distress.   HEENT: Atraumatic, normocephalic. Extraocular muscles are intact. Pupils equal, reactive to light. Sclerae anicteric. No conjunctival injection. No pharyngeal erythema.   NECK: Supple. There is no jugular venous distention, no bruits, no lymphadenopathy. No thyromegaly.   HEART: Regular rate, rhythm.  No murmurs, no rubs, no clicks.   LUNGS: Clear to auscultation bilaterally. No rales, no rhonchi, no wheezes.   ABDOMEN: Soft, flat, nontender, nondistended. Has good bowel sounds. No hepatosplenomegaly appreciated.    EXTREMITIES: No evidence of any cyanosis, clubbing, or peripheral edema. Has +2 pedal and radial pulses bilaterally.   NEUROLOGIC: Patient is alert, awake, oriented x3 with no focal motor or sensory deficits appreciated bilaterally.   SKIN: Moist, warm with no rash appreciated.   LYMPHATIC: There is no cervical or axillary lymphadenopathy.   LABORATORY, DIAGNOSTIC, AND RADIOLOGICAL DATA: Serum glucose 99, BUN 17, creatinine 1.3, sodium 140, potassium 4.5, chloride 107, bicarbonate 25, albumin 3.8. LFTs are within normal limits. Troponin less than 0.02. White cell count 8.3, hemoglobin 14.7, hematocrit 42.3, platelet count 109. Urinalysis is within normal limits.   Patient did have a chest x-ray done which showed no evidence of acute cardiopulmonary disease. Patient also had an EKG done which showed normal sinus rhythm with normal axes and no evidence of any acute ST or T wave changes.   ASSESSMENT AND PLAN: This is a 59 year old male with past medical history of hypertension, diabetes, hyperlipidemia, history of coronary artery disease status post stent placement, anxiety, gastroesophageal reflux disease presents to the hospital with chest pain worrisome for angina.  1. Chest pain/angina. Patient has been having escalating symptoms since late last night worrisome for possible underlying unstable angina even though his cardiac markers are negative, his EKG does not show any acute ST or T wave changes. He does have significant risk factors given his previous history of coronary artery disease status post stent placement and also diabetes. Therefore I will observe him overnight on telemetry. Continue his aspirin, Plavix, Toprol and atorvastatin. Also put him on some oxygen, morphine, give him some p.r.n. nitroglycerin. Start him on a heparin nomogram. I discussed the case with Dr. Harold HedgeKenneth Fath who likely will evaluate the patient today. Patient may need a repeat cardiac catheterization if he rules in  or if his symptoms continue to persist.  2. Hypertension, presently hemodynamically stable. Continue with his Toprol, lisinopril and Imdur.  3. Hyperlipidemia. Continue atorvastatin.  4. Diabetes. Hold his metformin. Place him on sliding scale insulin in case he needs a cardiac catheterization.  5. Gastroesophageal reflux disease. Continue Zantac.  6. Depression/anxiety. Continue with the Xanax and Zoloft.  7. CODE STATUS: Patient is a FULL CODE.   TIME SPENT WITH THE ADMISSION: 50 minutes.  ____________________________ Rolly PancakeVivek J. Cherlynn KaiserSainani, MD vjs:cms D: 08/25/2012 13:54:35 ET T: 08/25/2012 14:07:34 ET JOB#: [id number]  cc: Rolly PancakeVivek J. Cherlynn KaiserSainani, MD, <Dictator> Teena Iraniavid M. Terance HartBronstein, MD Houston SirenVIVEK J Brindley Madarang MD ELECTRONICALLY SIGNED 08/25/2012 16:12

## 2015-03-11 NOTE — Consult Note (Signed)
General Aspect Pt with history of cad s/p pci of rca in 4/10.  He had a cypher stent place in the om1 as well. He is now admitted with chest pain. EKG is unremarkable and initial tropoinin is normal. He had a relook cardiac cath in 11/12 per Dr. Darrold JunkerParaschos which revealed patent rca and om1 stents with 40-50% stenosis of the lm. [facility] revealing no significant disease requiring intervention. He states he has had constant pain for at least 18-24 hours. Tropoinin is normal as is ekg. Improved with morphine.   Physical Exam:   GEN obese    HEENT PERRL    NECK supple    RESP normal resp effort    CARD Regular rate and rhythm  No murmur    ABD denies tenderness  no Adominal Mass    LYMPH negative neck    EXTR negative cyanosis/clubbing, negative edema    SKIN normal to palpation    NEURO cranial nerves intact, motor/sensory function intact    PSYCH A+O to time, place, person   Review of Systems:   Subjective/Chief Complaint chest and arm pain    General: No Complaints    Skin: No Complaints    ENT: No Complaints    Eyes: No Complaints    Neck: No Complaints    Respiratory: No Complaints    Cardiovascular: Chest pain or discomfort    Gastrointestinal: No Complaints    Genitourinary: No Complaints    Vascular: No Complaints    Musculoskeletal: No Complaints    Neurologic: No Complaints    Hematologic: No Complaints    Endocrine: No Complaints    Psychiatric: No Complaints    Review of Systems: All other systems were reviewed and found to be negative    Medications/Allergies Reviewed Medications/Allergies reviewed     45% blockage to left coranary main.:    styents cardiac.:    anxiety:    GERD:    MI:    Hyperlipidemia:    Hypertension:    Diabetes Mellitus, Type II (NIDD):    Angioplasty:   Home Medications: Medication Instructions Status  atorvastatin 40 mg oral tablet 1 tab(s) orally once a day (at  bedtime) Active  metformin 850 mg oral tablet 1 tab(s) orally 2 times a day Active  metoprolol succinate 25 mg oral tablet, extended release 1 tab(s) orally once a day Active  Zantac 150 oral tablet 1 tab(s) orally every 12 hours as directed Active  Xanax 0.5 mg oral tablet 1 tab(s) orally once a day (at bedtime) Active  sucralfate 1 g oral tablet 1 tab(s) orally 4 times a day (before meals and at bedtime) Active  clopidogrel 75 mg oral tablet 1 tab(s) orally once a day Active  Aspirin Enteric Coated 81 mg oral delayed release tablet 1 tab(s) orally once a day Active  nitroglycerin 0.4 mg sublingual tablet 1 tab(s) sublingual every 5 minutes, As Needed- for Chest Pain  Active  lisinopril 5 mg oral tablet 1 tab(s) orally once a day Active  isosorbide mononitrate 30 mg oral tablet, extended release 1 tab(s) orally once a day Active  sertraline 25 mg oral tablet 1 tab(s) orally once a day (at bedtime) Active  NuLev 0.125 mg oral tablet, disintegrating 1 tab(s) orally every 4 to 6 hours, As Needed for digestive problems Active  Celebrex 200 mg oral capsule 1 cap(s) orally once a day Active   EKG:   EKG NSR  No Known Allergies:     Impression Pt with history of cad s/p pci of rca and om1 in 2010 with relook cardiac cath in 11/12 and 5/13 revealing no signficant disesase requiring intervention. Now with chest pain with typcial and atypical features. Has ruled out for an mi thus far with one normal troponbin. EKG is unremarkable. Pt has had the pain for 18 hours with no increased cardiac markers. Would rule out for mi. Would conintue with heparin, nitrates, beta blcokers and clopidogrel. If patient has persistant pain or rules in for mi, consider relook cath although has had two caths in the last year without intervention.    Plan 1. Rule out for mi 2. Continue heparin, nitrates, beta blockers and plavix 3. Follow ekg 4. Cath vs continued medical therapy pending course.   Electronic  Signatures: Dalia Heading (MD)  (Signed 04-Oct-13 16:29)  Authored: General Aspect/Present Illness, History and Physical Exam, Review of System, Past Medical History, Home Medications, EKG , Allergies, Impression/Plan   Last Updated: 04-Oct-13 16:29 by Dalia Heading (MD)

## 2015-03-11 NOTE — Discharge Summary (Signed)
PATIENT NAME:  [name], Lee H MR#:  [id number] DATE OF BIRTH:  05-12-1956  DATE OF ADMISSION:  08/25/2012 DATE OF DISCHARGE:  08/26/2012  PRIMARY CARE PHYSICIAN: Dr. Dorothey Basemanavid Bronstein  CARDIOLOGIST: Dr. Marcina MillardAlexander Paraschos   CHIEF COMPLAINT: Chest pain.   DISCHARGE DIAGNOSIS: Coronary artery disease, stable disease, medical management.   HISTORY AND PRESENTATION: 59 year old male presented to the Emergency Room due to worsening of chest pain that began last night and pain continued for more than 10 hours. He tried taking his nitroglycerin sublingual pills but minimal relief. Patient had a history of coronary artery disease and multiple stent placement and so he was concerned about having new cardiac event and so came to Emergency Room. On arrival his EKG and cardiac marker enzymes were negative but he was admitted for further work-up as having extensive cardiac history.   HOSPITAL STAY: Due to the extensive cardiac history and typical chest pain, which was relieved by morphine, he was admitted with heparin IV drip and followed up on telemetry with three sets of cardiac enzymes and cardiology consult. His all three cardiac enzymes were negative. Telemetry remained without any event. Cardiology consult was done and they initially suggested to continue heparin drip until all three enzymes are followed and after the follow up of all three I discussed with on-call cardiologist, Dr. Juliann Paresallwood, who was covering for Dr. Lady GaryFath and he suggested to discharge the patient as medical management is the option for the patient no any acute cardiac event and let him follow with his primary cardiologist.   OTHER ISSUES ADDRESSED DURING THE HOSPITAL STAY:  1. Hypertension. His blood pressure remained under control with metoprolol, lisinopril and Imdur so we continued.  2. Hyperlipidemia. We continued his atorvastatin.  3. Diabetes. We held his metformin on admission because of possibility of undergoing cardiac  catheterization but then on discharge he was advised to continue the same as he was taking before.  4. For his gastroesophageal reflux disease, we continued the Zantac.  5. For depression, continued Xanax and Zoloft.    CONDITION ON DISCHARGE: Satisfactory.   CODE STATUS: FULL CODE.   MEDICATIONS TO BE CONTINUED ON DISCHARGE: Same as his home medication as following:  1. Atorvastatin 40 mg 1 tablet once a day at night.  2. Metformin 50 mg tablet 2 times a day.  3. Metoprolol succinate 25 mg extended release tablet once a day. 4. Zantac 150 mg tablet every 12 hours. 5. Xanax 0.5 mg tablet once a day. 6. Sucralfate 1 gram oral tablet 4 times a day before meals.  7. Clopidogrel 75 mg oral tablet once a day. 8. Aspirin enteric coated 81 mg delayed-release once a day. 9. Nitroglycerin 0.4 mg sublingual tablet every five minutes as needed for chest pain, take up to 3 tablets. 10. Lisinopril 5 mg oral tablet once a day. 11. Isosorbide mononitrate 30 mg oral tablet extended release once a day. 12. Sertraline 25 mg oral tablet once a day at bedtime.  13. Nulev 1.125 mg oral tablet every 4 to 6 hours as needed for digestive problems. 14. Celebrex 200 mg oral tablet once a day.   DIET: Advised low fat, low cholesterol, carbohydrate-controlled diet, consistency regular.   ACTIVITY LIMITATIONS: None. Return to work within a week.   FOLLOW UP: Follow up with Dr. Darrold JunkerParaschos within 1 to 2 weeks and follow up with [facility]id [facility] [place] within 1 to 2 weeks.   TOTAL TIME SPENT IN DISCHARGE: 35  minutes.  ____________________________ Hope Pigeon Elisabeth Pigeon, MD vgv:cms D: 08/26/2012 13:04:04 ET T: 08/26/2012 13:14:38 ET  JOB#: [id number] cc: Hope Pigeon. Elisabeth Pigeon, MD, <Dictator> Teena Irani. Terance Hart, MD Marcina Millard, MD Altamese Dilling MD ELECTRONICALLY SIGNED 09/12/2012 18:08

## 2015-03-24 ENCOUNTER — Encounter: Payer: BLUE CROSS/BLUE SHIELD | Attending: Family Medicine | Admitting: Respiratory Therapy

## 2015-03-24 DIAGNOSIS — G4730 Sleep apnea, unspecified: Secondary | ICD-10-CM | POA: Insufficient documentation

## 2015-03-24 NOTE — Progress Notes (Signed)
Daily Session Note  Patient Details  Name: [name] MRN: [id number] Date of Birth: [birth year] Referring Provider:  Minna Merritts, MD  Encounter Date: 03/24/2015  Check In:     Session Check In - 03/24/15 1411    Check-In   Staff Present Carson Myrtle BS, RRT, Respiratory Therapist  Gerlene Burdock RN, BSN   ER physicians immediately available to respond to emergencies [facility] immediately available ER MD   Physician(s) Joni Fears and [employer] and Cool-down Performed on first and last piece of equipment   VAD Patient? No   Pain Assessment   Currently in Pain? No/denies   Multiple Pain Sites No           Exercise Prescription Changes - 03/24/15 1400    Treadmill   Grade 1      Goals Met:  Proper associated with RPD/PD & O2 Sat Independence with exercise equipment Using PLB without cueing & demonstrates good technique Exercise tolerated well Personal goals reviewed  Goals Unmet:  Not Applicable  Goals Comments: Educated on 1% treadmill grade equal to daily walking grade.    Dr. Emily Filbert is Medical Director for [place] and [facility].

## 2015-03-26 ENCOUNTER — Encounter: Payer: BLUE CROSS/BLUE SHIELD | Admitting: *Deleted

## 2015-03-26 DIAGNOSIS — G4730 Sleep apnea, unspecified: Secondary | ICD-10-CM | POA: Insufficient documentation

## 2015-03-26 NOTE — Progress Notes (Signed)
Daily Session Note  Patient Details  Name: ALEXYS GASSETT MRN: [id number] Date of Birth: Nov 18, 1956 Referring Provider:  Juluis Pitch, MD  Encounter Date: 03/26/2015  Check In:     Session Check In - 03/26/15 1118    Check-In   Staff Present Carson Myrtle BS,RRT, Respiratory Therapist; Lestine Box BS, ACSM EP-C, Exercise Physiologist; Candiss Norse MS, ACSM CEP, Exercise Physiologist   ER physicians immediately available to respond to emergencies [facility] immediately available ER MD   Physician(s) Dr. Reita Cliche and DR. Gayle    Warm-up and Cool-down Performed on first and last piece of equipment   VAD Patient? No   Pain Assessment   Currently in Pain? No/denies           Exercise Prescription Changes - 03/26/15 1100    Treadmill   Grade 1   Recumbant Elliptical   Level 4.5   Watts 55      Goals Met:  Proper associated with RPD/PD & O2 Sat Exercise tolerated well  Goals Unmet:  Not Applicable  Goals Comments:   Dr. Emily Filbert is Medical Director for [place] and [facility].

## 2015-03-28 ENCOUNTER — Encounter: Payer: BLUE CROSS/BLUE SHIELD | Admitting: Respiratory Therapy

## 2015-03-28 DIAGNOSIS — G4730 Sleep apnea, unspecified: Secondary | ICD-10-CM | POA: Diagnosis not present

## 2015-03-28 DIAGNOSIS — G4733 Obstructive sleep apnea (adult) (pediatric): Secondary | ICD-10-CM

## 2015-03-28 NOTE — Progress Notes (Signed)
Daily Session Note  Patient Details  Name: [name] MRN: [id number] Date of Birth: Aug 23, 1956 Referring Provider:  Juluis Pitch, MD  Encounter Date: 03/28/2015  Check In:     Session Check In - 03/28/15 1055    Check-In   Staff Present Heath Lark RN, BSN, CCRP;Carroll Enterkin RN, BSN;Nemiah Bubar Blanch Media RRT, RCP Respiratory Therapist;Renee Dillard [place] MS, ACSM CEP Exercise Physiologist   ER physicians immediately available to respond to emergencies [facility] immediately available ER MD   Physician(s) Dr. Karma Greaser and Dr. Reita Cliche   Warm-up and Cool-down Performed on first and last piece of equipment   VAD Patient? No   Pain Assessment   Currently in Pain? No/denies           Exercise Prescription Changes - 03/28/15 1000    Recumbant Elliptical   Watts 60      Goals Met:  Proper associated with RPD/PD & O2 Sat Independence with exercise equipment Exercise tolerated well Personal goals reviewed  Goals Unmet:  Not Applicable  Goals Comments:    Dr. Emily Filbert is Medical Director for [place] and [facility].

## 2015-03-31 ENCOUNTER — Encounter: Payer: BLUE CROSS/BLUE SHIELD | Admitting: *Deleted

## 2015-03-31 DIAGNOSIS — G4730 Sleep apnea, unspecified: Secondary | ICD-10-CM

## 2015-03-31 NOTE — Progress Notes (Signed)
Daily Session Note  Patient Details  Name: PASCHAL BLANTON MRN: [id number] Date of Birth: 1956-01-05 Referring Provider:  Juluis Pitch, MD  Encounter Date: 03/31/2015  Check In:     Session Check In - 03/31/15 1117    Check-In   Staff Present Candiss Norse MS, ACSM CEP Exercise Physiologist;Steven Way BS, ACSM EP-C, Exercise Physiologist;Laureen Janell Quiet, RRT, Respiratory Therapist   ER physicians immediately available to respond to emergencies [facility] immediately available ER MD   Physician(s) Schaevit and Jimmye Norman   Warm-up and Cool-down Performed on first and last piece of equipment   VAD Patient? No   Pain Assessment   Currently in Pain? No/denies   Multiple Pain Sites No           Exercise Prescription Changes - 03/31/15 1100    Exercise Review   Progression Yes   Treadmill   MPH 2.6   Grade 1   NuStep   Level 5   Watts 60      Goals Met:  Proper associated with RPD/PD & O2 Sat Independence with exercise equipment Exercise tolerated well Personal goals reviewed  Goals Unmet:  Not Applicable  Goals Comments:    Dr. Emily Filbert is Medical Director for [place] and [facility].

## 2015-04-02 ENCOUNTER — Encounter: Payer: BLUE CROSS/BLUE SHIELD | Admitting: *Deleted

## 2015-04-02 DIAGNOSIS — G4730 Sleep apnea, unspecified: Secondary | ICD-10-CM | POA: Diagnosis not present

## 2015-04-02 NOTE — Progress Notes (Signed)
Daily Session Note  Patient Details  Name: [name] MRN: [id number] Date of Birth: March 04, 1956 Referring Provider:  Juluis Pitch, MD  Encounter Date: 04/02/2015  Check In:     Session Check In - 04/02/15 1134    Check-In   Staff Present Heath Lark RN, BSN, CCRP;Steven Way BS, ACSM EP-C, Exercise Physiologist;Renee [place] MS, ACSM CEP Exercise Physiologist;Laureen [employer], RRT, Respiratory Therapist   ER physicians immediately available to respond to emergencies [facility] immediately available ER MD   Physician(s) Drs: Thomasene Lot and Joni Fears   Medication changes reported     No   Fall or balance concerns reported    No   Warm-up and Cool-down Performed on first and last piece of equipment   VAD Patient? No   Pain Assessment   Currently in Pain? No/denies         Goals Met:  Independence with exercise equipment Exercise tolerated well Strength training completed today  Goals Unmet:  Not Applicable  Goals Comments:    Dr. Emily Filbert is Medical Director for [place] and [facility].

## 2015-04-04 ENCOUNTER — Encounter: Payer: Self-pay | Admitting: *Deleted

## 2015-04-04 NOTE — Progress Notes (Signed)
Incomplete Session Note  Patient Details  Name: [name] MRN: [id number] Date of Birth: 01/13/1956 Referring Provider:  No ref. provider found  [name] did not complete his rehab session.  [name] stopped by this AMto tell us that he fell at home yesterday and has MD appointment to assess his injuries.

## 2015-04-09 ENCOUNTER — Telehealth: Payer: Self-pay

## 2015-04-09 NOTE — Telephone Encounter (Signed)
[name] stated that he was still feeling sore from his fall last week, but is going to try and return to class by this Friday.

## 2015-04-14 ENCOUNTER — Encounter: Payer: Self-pay | Admitting: *Deleted

## 2015-04-14 ENCOUNTER — Encounter: Payer: BLUE CROSS/BLUE SHIELD | Admitting: *Deleted

## 2015-04-14 DIAGNOSIS — G4730 Sleep apnea, unspecified: Secondary | ICD-10-CM | POA: Diagnosis not present

## 2015-04-14 NOTE — Addendum Note (Signed)
Addended by: Tommye StandardHAYES, Kushi Kun T on: 04/14/2015 11:29 AM   Modules accepted: Orders

## 2015-04-14 NOTE — Progress Notes (Signed)
Pulmonary Individual Treatment Plan  Patient Details  Name: [name] MRN: [id number] Date of Birth: Sep 26, 1956 Referring Provider:  Dorothey Baseman, MD  Initial Encounter Date:    Visit Diagnosis: Morbid obesity  Sleep apnea  Patient's Home Medications on Admission:  Current outpatient prescriptions:  .  acetaminophen (TYLENOL) 325 MG tablet, Take 650 mg by mouth every 6 (six) hours as needed., Disp: , Rfl:  .  albuterol (PROVENTIL HFA;VENTOLIN HFA) 108 (90 BASE) MCG/ACT inhaler, Inhale into the lungs every 6 (six) hours as needed for wheezing or shortness of breath., Disp: , Rfl:  .  ALPRAZolam (XANAX) 0.5 MG tablet, Take 1/2 tablet in the am with 1 tablet in the pm., Disp: , Rfl:  .  aspirin 81 MG tablet, Take 81 mg by mouth daily., Disp: , Rfl:  .  atorvastatin (LIPITOR) 40 MG tablet, Take 40 mg by mouth daily., Disp: , Rfl:  .  buPROPion (WELLBUTRIN SR) 150 MG 12 hr tablet, Take 150 mg by mouth 2 (two) times daily., Disp: , Rfl:  .  clopidogrel (PLAVIX) 75 MG tablet, Take 75 mg by mouth daily., Disp: , Rfl:  .  fluticasone-salmeterol (ADVAIR HFA) 115-21 MCG/ACT inhaler, Inhale 2 puffs into the lungs 2 (two) times daily., Disp: , Rfl:  .  furosemide (LASIX) 20 MG tablet, Take 20 mg by mouth., Disp: , Rfl:  .  gabapentin (NEURONTIN) 300 MG capsule, Take 300 mg by mouth 3 (three) times daily., Disp: , Rfl:  .  ibuprofen (ADVIL,MOTRIN) 200 MG tablet, Take 200 mg by mouth every 6 (six) hours as needed., Disp: , Rfl:  .  metFORMIN (GLUCOPHAGE) 850 MG tablet, Take 850 mg by mouth daily with breakfast. , Disp: , Rfl:  .  metoprolol tartrate (LOPRESSOR) 25 MG tablet, Take 12.5 mg by mouth 2 (two) times daily., Disp: , Rfl:  .  nitroGLYCERIN (NITROSTAT) 0.4 MG SL tablet, Place 0.4 mg under the tongue every 5 (five) minutes as needed for chest pain., Disp: , Rfl:  .  potassium chloride SA (K-DUR,KLOR-CON) 20 MEQ tablet, Take 20 mEq by mouth daily. , Disp: , Rfl:  .  RABEprazole  (ACIPHEX) 20 MG tablet, Take 20 mg by mouth 2 (two) times daily. , Disp: , Rfl:  .  ranitidine (ZANTAC) 150 MG capsule, Take 150 mg by mouth 2 (two) times daily., Disp: , Rfl:  .  ranolazine (RANEXA) 1000 MG SR tablet, Take 1 tablet (1,000 mg total) by mouth 2 (two) times daily., Disp: 60 tablet, Rfl: 6 .  senna (SENOKOT) 8.6 MG tablet, Take 1 tablet by mouth daily., Disp: , Rfl:  .  venlafaxine (EFFEXOR) 75 MG tablet, Take 75 mg by mouth 2 (two) times daily., Disp: , Rfl:   Past Medical History: Past Medical History  Diagnosis Date  . Type 2 diabetes mellitus   . Coronary artery disease   . GERD (gastroesophageal reflux disease)   . Hyperlipidemia   . Hypertension   . Atherosclerosis of coronary artery bypass graft with angina pectoris   . Sleep apnea   . Barrett's esophagus   . Anxiety   . H/O esophageal spasm   . Chronic constipation   . Colon polyp   . MI (myocardial infarction)     Tobacco Use: History  Smoking status  . Never Smoker   Smokeless tobacco  . Not on file    Labs: Recent Review Flowsheet Data    Labs for ITP Cardiac and Pulmonary Rehab Latest Ref Rng 04/22/2009  11/18/2009 11/19/2009   Cholestrol 0 - 200 mg/dL - - 161 ATP III CLASSIFICATION: <200     mg/dL   Desirable 096-045  mg/dL   Borderline High >=409    mg/dL   High   LDLCALC 0 - 99 mg/dL - - 70 Total Cholesterol/HDL:CHD Risk Coronary Heart Disease Risk Table Men   Women 1/2 Average Risk   3.4   3.3 Average Risk       5.0   4.4 2 X Average Risk   9.6   7.1 3 X Average Risk  23.4   11.0 Use the calculated Patient Ratio above and the CHD Risk Table to determine the patient's CHD Risk. ATP III CLASSIFICATION (LDL): <100     mg/dL   Optimal 811-914  mg/dL   Near or Above Optimal 130-159  mg/dL   Borderline 782-956  mg/dL   High >213     mg/dL   Very High   HDL >08 mg/dL - - 65(H)   Trlycerides <150 mg/dL - - 75   Hemoglobin Q4O 4.6 - 6.1 % - 5.7 (NOTE) The ADA recommends the following  therapeutic goal for glycemic control related to Hgb A1c measurement: Goal of therapy: <6.5 Hgb A1c  Reference: American Diabetes Association: Clinical Practice Recommendations 2010, Diabetes Care, 2010, 33: (Suppl 1). -   TCO2 0 - 100 mmol/L 21 - -       ADL UCSD:    Pulmonary Function Assessment:   Exercise Target Goals:    Exercise Program Goal: Individual exercise prescription set with THRR, safety & activity barriers. Participant demonstrates ability to understand and report RPE using BORG scale, to self-measure pulse accurately, and to acknowledge the importance of the exercise prescription.  Exercise Prescription Goal: Starting with aerobic activity 30 plus minutes a day, 3 days per week for initial exercise prescription. Provide home exercise prescription and guidelines that participant acknowledges understanding prior to discharge.  Activity Barriers & Risk Stratification:   6 Minute Walk:   Initial Exercise Prescription:   Exercise Prescription Changes:     Exercise Prescription Changes      03/24/15 1400 03/26/15 1100 03/28/15 1000 03/31/15 1100 04/14/15 1100   Exercise Review   Progression    Yes Yes   Response to Exercise   Blood Pressure (Admit)     110/68 mmHg   Blood Pressure (Exercise)     132/74 mmHg   Blood Pressure (Exit)     120/82 mmHg   Heart Rate (Admit)     76 bpm   Heart Rate (Exercise)     94 bpm   Heart Rate (Exit)     83 bpm   Oxygen Saturation (Admit)     100 %   Oxygen Saturation (Exercise)     97 %   Oxygen Saturation (Exit)     100 %   Rating of Perceived Exertion (Exercise)     15   Perceived Dyspnea (Exercise)     4   Resistance Training   Training Prescription     Yes   Weight     5   Reps     10-12   Treadmill   MPH    2.6 2.6   Grade Minutes     15   NuStep   Level    5 5   Watts    60 60   Minutes     10   Recumbant Elliptical  Level  4.5   4.5   Watts  55 60  60   Minutes     15      Discharge  Exercise Prescription:    Nutrition:  Target Goals: Understanding of nutrition guidelines, daily intake of sodium 1500mg , cholesterol 200mg , calories 30% from fat and 7% or less from saturated fats, daily to have 5 or more servings of fruits and vegetables.  Biometrics:    Nutrition Therapy Plan and Nutrition Goals:   Nutrition Discharge: Rate Your Plate Scores:   Psychosocial: Target Goals: Acknowledge presence or absence of depression, maximize coping skills, provide positive support system. Participant is able to verbalize types and ability to use techniques and skills needed for reducing stress and depression.  Initial Review & Psychosocial Screening:   Quality of Life Scores:   PHQ-9:     Recent Review Flowsheet Data    There is no flowsheet data to display.      Psychosocial Evaluation and Intervention:   Psychosocial Re-Evaluation:  Education: Education Goals: Education classes will be provided on a weekly basis, covering required topics. Participant will state understanding/return demonstration of topics presented.  Learning Barriers/Preferences:   Education Topics: Initial Evaluation Education: - Verbal, written and demonstration of respiratory meds, RPE/PD scales, oximetry and breathing techniques. Instruction on use of nebulizers and MDIs: cleaning and proper use, rinsing mouth with steroid doses and importance of monitoring MDI activations.   General Nutrition Guidelines/Fats and Fiber: -Group instruction provided by verbal, written material, models and posters to present the general guidelines for heart healthy nutrition. Gives an explanation and review of dietary fats and fiber.   Controlling Sodium/Reading Food Labels: -Group verbal and written material supporting the discussion of sodium use in heart healthy nutrition. Review and explanation with models, verbal and written materials for utilization of the food label.   Exercise Physiology &  Risk Factors: - Group verbal and written instruction with models to review the exercise physiology of the cardiovascular system and associated critical values. Details cardiovascular disease risk factors and the goals associated with each risk factor.   Aerobic Exercise & Resistance Training: - Gives group verbal and written discussion on the health impact of inactivity. On the components of aerobic and resistive training programs and the benefits of this training and how to safely progress through these programs.   Flexibility, Balance, General Exercise Guidelines: - Provides group verbal and written instruction on the benefits of flexibility and balance training programs. Provides general exercise guidelines with specific guidelines to those with heart or lung disease. Demonstration and skill practice provided.   Stress Management: - Provides group verbal and written instruction about the health risks of elevated stress, cause of high stress, and healthy ways to reduce stress.   Depression: - Provides group verbal and written instruction on the correlation between heart/lung disease and depressed mood, treatment options, and the stigmas associated with seeking treatment.   Exercise & Equipment Safety: - Individual verbal instruction and demonstration of equipment use and safety with use of the equipment.   Infection Prevention: - Provides verbal and written material to individual with discussion of infection control including proper hand washing and proper equipment cleaning during exercise session.   Falls Prevention: - Provides verbal and written material to individual with discussion of falls prevention and safety.   Diabetes: - Individual verbal and written instruction to review signs/symptoms of diabetes, desired ranges of glucose level fasting, after meals and with exercise. Advice that pre and post exercise glucose checks  will be done for 3 sessions at entry of  program.   Chronic Lung Diseases: - Group verbal and written instruction to review new updates, new respiratory medications, new advancements in procedures and treatments. Provide informative websites and "800" numbers of self-education.   Lung Procedures: - Group verbal and written instruction to describe testing methods done to diagnose lung disease. Review the outcome of test results. Describe the treatment choices: Pulmonary Function Tests, ABGs and oximetry.   Energy Conservation: - Provide group verbal and written instruction for methods to conserve energy, plan and organize activities. Instruct on pacing techniques, use of adaptive equipment and posture/positioning to relieve shortness of breath.   Triggers: - Group verbal and written instruction to review types of environmental controls: home humidity, furnaces, filters, dust mite/pet prevention, HEPA vacuums. To discuss weather changes, air quality and the benefits of nasal washing.   Exacerbations: - Group verbal and written instruction to provide: warning signs, infection symptoms, calling MD promptly, preventive modes, and value of vaccinations. Review: effective airway clearance, coughing and/or vibration techniques. Create an Sport and exercise psychologist.   Oxygen: - Individual and group verbal and written instruction on oxygen therapy. Includes supplement oxygen, available portable oxygen systems, continuous and intermittent flow rates, oxygen safety, concentrators, and Medicare reimbursement for oxygen.   Respiratory Medications: - Group verbal and written instruction to review medications for lung disease. Drug class, frequency, complications, importance of spacers, rinsing mouth after steroid MDI's, and proper cleaning methods for nebulizers.   AED/CPR: - Group verbal and written instruction with the use of models to demonstrate the basic use of the AED with the basic ABC's of resuscitation.   Breathing Retraining: - Provides  individuals verbal and written instruction on purpose, frequency, and proper technique of diaphragmatic breathing and pursed-lipped breathing. Applies individual practice skills.   Anatomy and Physiology of the Lungs: - Group verbal and written instruction with the use of models to provide basic lung anatomy and physiology related to function, structure and complications of lung disease.   Heart Failure: - Group verbal and written instruction on the basics of heart failure: signs/symptoms, treatments, explanation of ejection fraction, enlarged heart and cardiomyopathy.   Sleep Apnea: - Individual verbal and written instruction to review Obstructive Sleep Apnea. Review of risk factors, methods for diagnosing and types of masks and machines for OSA.   Anxiety: - Provides group, verbal and written instruction on the correlation between heart/lung disease and anxiety, treatment options, and management of anxiety.   Relaxation: - Provides group, verbal and written instruction about the benefits of relaxation for patients with heart/lung disease. Also provides patients with examples of relaxation techniques.   Knowledge Questionnaire Score:   Personal Goals and Risk Factors at Admission:   Personal Goals and Risk Factors Review:    Personal Goals Discharge:    Comments: 30 day review

## 2015-04-14 NOTE — Progress Notes (Signed)
Daily Session Note  Patient Details  Name: [name] MRN: [id number] Date of Birth: [birth year] Referring Provider:  Juluis Pitch, MD  Encounter Date: 04/14/2015  Check In:     Session Check In - 04/14/15 1101    Check-In   Staff Present Candiss Norse MS, ACSM CEP Exercise Physiologist;Jamie Athas MPH, Werner Lean BS, ACSM CEP Exercise Physiologist;Laureen Janell Quiet, RRT, Respiratory Therapist   ER physicians immediately available to respond to emergencies [facility] immediately available ER MD   Physician(s) Corky Downs and Lord   Medication changes reported     No   Fall or balance concerns reported    No   Warm-up and Cool-down Performed on first and last piece of equipment   VAD Patient? No   Pain Assessment   Currently in Pain? No/denies   Multiple Pain Sites No           Exercise Prescription Changes - 04/14/15 1100    Exercise Review   Progression Yes   Response to Exercise   Blood Pressure (Admit) 110/68 mmHg   Blood Pressure (Exercise) 132/74 mmHg   Blood Pressure (Exit) 120/82 mmHg   Heart Rate (Admit) 76 bpm   Heart Rate (Exercise) 94 bpm   Heart Rate (Exit) 83 bpm   Oxygen Saturation (Admit) 100 %   Oxygen Saturation (Exercise) 97 %   Oxygen Saturation (Exit) 100 %   Rating of Perceived Exertion (Exercise) 15   Perceived Dyspnea (Exercise) 4   Resistance Training   Training Prescription Yes   Weight 5   Reps 10-12   Treadmill   MPH 2.6   Grade 1   Minutes 15   NuStep   Level 5   Watts 60   Minutes 10   Recumbant Elliptical   Level 4.5   Watts 60   Minutes 15      Goals Met:  Proper associated with RPD/PD & O2 Sat Independence with exercise equipment Exercise tolerated well  Goals Unmet:  Not Applicable  Goals Comments: First day back for Mr Pelaez after his fall 2 weeks ago; he modified his exercise goals and did leave early for an appointment with Dr Rockey Situ.   Dr. Emily Filbert is Medical Director for [facility] and [facility].

## 2015-04-14 NOTE — Progress Notes (Signed)
Daily Session Note  Patient Details  Name: [name] MRN: [id number] Date of Birth: 03/03/1956 Referring Provider:  Juluis Pitch, MD  Encounter Date: 04/14/2015  Check In:     Session Check In - 04/14/15 1101    Check-In   Staff Present Candiss Norse MS, ACSM CEP Exercise Physiologist;Jamie Athas MPH, Werner Lean BS, ACSM CEP Exercise Physiologist;Laureen Janell Quiet, RRT, Respiratory Therapist   ER physicians immediately available to respond to emergencies [facility] immediately available ER MD   Physician(s) Corky Downs and Lord   Medication changes reported     No   Fall or balance concerns reported    No   Warm-up and Cool-down Performed on first and last piece of equipment   VAD Patient? No   Pain Assessment   Currently in Pain? No/denies   Multiple Pain Sites No         Goals Met:  Proper associated with RPD/PD & O2 Sat Independence with exercise equipment Exercise tolerated well  Goals Unmet:  Not Applicable  Goals Comments:    Dr. Emily Filbert is Medical Director for [facility] and [facility].

## 2015-04-16 DIAGNOSIS — G4730 Sleep apnea, unspecified: Secondary | ICD-10-CM | POA: Diagnosis not present

## 2015-04-16 NOTE — Patient Instructions (Signed)
pul Patient Instructions  Patient Details  Name: [name] MRN: [id number] Date of Birth: 06-12-1956 Referring Provider:  Dorothey Baseman, MD  Below are the personal goals you chose as well as exercise and nutrition goals. Our goal is to help you keep on track towards obtaining and maintaining your goals. We will be discussing your progress on these goals with you throughout the program.  Initial Exercise Prescription:   Exercise Goals: Frequency: Be able to perform aerobic exercise three times per week working toward 3-5 days per week.  Intensity: Work with a perceived exertion of 11 (fairly light) - 15 (hard) as tolerated. Follow your new exercise prescription and watch for changes in prescription as you progress with the program. Changes will be reviewed with you when they are made.  Duration: You should be able to do 30 minutes of continuous aerobic exercise in addition to a 5 minute warm-up and a 5 minute cool-down routine.  Nutrition Goals: Your personal nutrition goals will be established when you do your nutrition analysis with the dietician.  The following are nutrition guidelines to follow: Cholesterol < /day Sodium < /day Fiber: Men over 50 yrs - 30 grams per day  Personal Goals:     Personal Goals and Risk Factors at Admission - 02/25/15 0900    Personal Goals and Risk Factors on Admission    Weight Management Yes   Intervention Learn and follow the exercise and diet guidelines while in the program. Utilize the nutrition and education classes to help gain knowledge of the diet and exercise expectations in the program   Admit Weight 290 lb (131.543 kg)   Goal Weight 260 lb (117.935 kg)   Increase Aerobic Exercise and Physical Activity Yes   Intervention While in program, learn and follow the exercise prescription taught. Start at a low level workload and increase workload after able to maintain previous level for 30 minutes. Increase time before  increasing intensity.   Understand more about Heart/Pulmonary Disease. Yes   Intervention While in program utilize professionals for any questions, and attend the education sessions. Great websites to use are www.americanheart.org or www.lung.org for reliable information.   Improve shortness of breath with ADL's Yes   Intervention While in program, learn and follow the exercise prescription taught. Start at a low level workload and increase workload ad advised by the exercise physiologist. Increase time before increasing intensity.   Develop more efficient breathing techniques such as purse lipped breathing and diaphragmatic breathing; and practicing self-pacing with activity Yes   Intervention While in program, learn and utilize the specific breathing techniques taught to you. Continue to practice and use the techniques as needed.   Increase knowledge of respiratory medications and ability to use respiratory devices properly.  Yes   Intervention While in program, learn to administer MDI, nebulizer, and spacer properly.;Learn to take respiratory medicine as ordered.;While in program, learn to Clean MDI, nebulizers, and spacers properly.   Diabetes Yes   Goal Blood glucose control identified by blood glucose values, HgbA1C. Participant verbalizes understanding of the signs/symptoms of hyper/hypo glycemia, proper foot care and importance of medication and nutrition plan for blood glucose control.   Intervention Provide nutrition & aerobic exercise along with prescribed medications to achieve blood glucose in normal ranges: Fasting 65-99 mg/dL   Hypertension Yes   Goal Participant will see blood pressure controlled within the values of 140/9mm/Hg or within value directed by their physician.   Intervention Provide nutrition & aerobic exercise along with prescribed  medications to achieve BP 140/90 or less.   Lipids Yes   Goal Cholesterol controlled with medications as prescribed, with individualized  exercise RX and with personalized nutrition plan. Value goals: LDL < 70mg , HDL > 40mg . Participant states understanding of desired cholesterol values and following prescriptions.   Intervention Provide nutrition & aerobic exercise along with prescribed medications to achieve LDL 70mg , HDL >40mg .      Tobacco Use Initial Evaluation: History  Smoking status   Never Smoker   Smokeless tobacco   Not on file    Copy of goals given to participant.

## 2015-04-16 NOTE — Progress Notes (Signed)
Daily Session Note  Patient Details  Name: [name] MRN: [id number] Date of Birth: 04/18/1956 Referring Provider:  Bronstein, David, MD  Encounter Date: 04/16/2015  Check In:     Session Check In - 04/16/15 1037    Check-In   Staff Present Carroll Enterkin RN, BSN;Renee MacMillan MS, ACSM CEP Exercise Physiologist;  BS, ACSM EP-C, Exercise Physiologist   ER physicians immediately available to respond to emergencies [facility] immediately available ER MD   Medication changes reported     No   Fall or balance concerns reported    No   Warm-up and Cool-down Performed on first and last piece of equipment   VAD Patient? No   Pain Assessment   Currently in Pain? No/denies         Goals Met:  Proper associated with RPD/PD & O2 Sat Exercise tolerated well No report of cardiac concerns or symptoms Strength training completed today  Goals Unmet:  Not Applicable  Goals Comments:    Dr. Mark Miller is Medical Director for [facility] and [facility]. 

## 2015-04-18 ENCOUNTER — Encounter: Payer: BLUE CROSS/BLUE SHIELD | Admitting: Respiratory Therapy

## 2015-04-18 ENCOUNTER — Encounter: Payer: Self-pay | Admitting: Respiratory Therapy

## 2015-04-18 DIAGNOSIS — G4730 Sleep apnea, unspecified: Secondary | ICD-10-CM

## 2015-04-18 NOTE — Patient Instructions (Signed)
Patient Instructions  Patient Details  Name: [name] MRN: [id number] Date of Birth: 09-13-1956 Referring Provider:  No ref. provider found  Below are the personal goals you chose as well as exercise and nutrition goals. Our goal is to help you keep on track towards obtaining and maintaining your goals. We will be discussing your progress on these goals with you throughout the program.  Initial Exercise Prescription:   Exercise Goals: Frequency: Be able to perform aerobic exercise three times per week working toward 3-5 days per week.  Intensity: Work with a perceived exertion of 11 (fairly light) - 15 (hard) as tolerated. Follow your new exercise prescription and watch for changes in prescription as you progress with the program. Changes will be reviewed with you when they are made.  Duration: You should be able to do 30 minutes of continuous aerobic exercise in addition to a 5 minute warm-up and a 5 minute cool-down routine.  Nutrition Goals: Your personal nutrition goals will be established when you do your nutrition analysis with the dietician.  The following are nutrition guidelines to follow: Cholesterol < /day Sodium < /day Fiber: Men over 50 yrs - 30 grams per day  Personal Goals:     Personal Goals and Risk Factors at Admission - 02/25/15 0900    Personal Goals and Risk Factors on Admission    Weight Management Yes   Intervention Learn and follow the exercise and diet guidelines while in the program. Utilize the nutrition and education classes to help gain knowledge of the diet and exercise expectations in the program   Admit Weight 290 lb (131.543 kg)   Goal Weight 260 lb (117.935 kg)   Increase Aerobic Exercise and Physical Activity Yes   Intervention While in program, learn and follow the exercise prescription taught. Start at a low level workload and increase workload after able to maintain previous level for 30 minutes. Increase time before  increasing intensity.   Understand more about Heart/Pulmonary Disease. Yes   Intervention While in program utilize professionals for any questions, and attend the education sessions. Great websites to use are www.americanheart.org or www.lung.org for reliable information.   Improve shortness of breath with ADL's Yes   Intervention While in program, learn and follow the exercise prescription taught. Start at a low level workload and increase workload ad advised by the exercise physiologist. Increase time before increasing intensity.   Develop more efficient breathing techniques such as purse lipped breathing and diaphragmatic breathing; and practicing self-pacing with activity Yes   Intervention While in program, learn and utilize the specific breathing techniques taught to you. Continue to practice and use the techniques as needed.   Increase knowledge of respiratory medications and ability to use respiratory devices properly.  Yes   Intervention While in program, learn to administer MDI, nebulizer, and spacer properly.;Learn to take respiratory medicine as ordered.;While in program, learn to Clean MDI, nebulizers, and spacers properly.   Diabetes Yes   Goal Blood glucose control identified by blood glucose values, HgbA1C. Participant verbalizes understanding of the signs/symptoms of hyper/hypo glycemia, proper foot care and importance of medication and nutrition plan for blood glucose control.   Intervention Provide nutrition & aerobic exercise along with prescribed medications to achieve blood glucose in normal ranges: Fasting 65-99 mg/dL   Hypertension Yes   Goal Participant will see blood pressure controlled within the values of 140/9mm/Hg or within value directed by their physician.   Intervention Provide nutrition & aerobic exercise along with prescribed  medications to achieve BP 140/90 or less.   Lipids Yes   Goal Cholesterol controlled with medications as prescribed, with individualized  exercise RX and with personalized nutrition plan. Value goals: LDL < 70mg , HDL > 40mg . Participant states understanding of desired cholesterol values and following prescriptions.   Intervention Provide nutrition & aerobic exercise along with prescribed medications to achieve LDL 70mg , HDL >40mg .      Tobacco Use Initial Evaluation: History  Smoking status  . Never Smoker   Smokeless tobacco  . Not on file    Copy of goals given to participant.

## 2015-04-18 NOTE — Progress Notes (Signed)
Daily Session Note  Patient Details  Name: [name] MRN: [id number] Date of Birth: 02-23-1956 Referring Provider:  Juluis Pitch, MD  Encounter Date: 04/18/2015  Check In:     Session Check In - 04/18/15 1603    Check-In   Staff Present Heath Lark RN, BSN, CCRP;Nonnie Pickney Blanch Media RRT, RCP Respiratory Therapist;Renee Dillard [place] MS, ACSM CEP Exercise Physiologist   ER physicians immediately available to respond to emergencies [facility] immediately available ER MD   Physician(s) Dr. Reita Cliche and Dr. Corky Downs   Medication changes reported     No   Fall or balance concerns reported    No   Warm-up and Cool-down Performed on first and last piece of equipment   VAD Patient? No   Pain Assessment   Currently in Pain? No/denies         Goals Met:  Proper associated with RPD/PD & O2 Sat Independence with exercise equipment Exercise tolerated well  Goals Unmet:  Not Applicable  Goals Comments:    Dr. Emily Filbert is Medical Director for [place] and [facility].

## 2015-04-23 ENCOUNTER — Encounter: Payer: BLUE CROSS/BLUE SHIELD | Attending: Family Medicine

## 2015-04-23 DIAGNOSIS — G4730 Sleep apnea, unspecified: Secondary | ICD-10-CM | POA: Diagnosis present

## 2015-04-23 NOTE — Progress Notes (Signed)
Daily Session Note  Patient Details  Name: [name] MRN: [id number] Date of Birth: June 30, 1956 Referring Provider:  Juluis Pitch, MD  Encounter Date: 04/23/2015  Check In:     Session Check In - 04/23/15 1058    Check-In   Staff Present Lestine Box BS, ACSM EP-C, Exercise Physiologist;Renee Dillard [place] MS, ACSM CEP Exercise Physiologist;Stacey Blanch Media RRT, RCP Respiratory Therapist   ER physicians immediately available to respond to emergencies [facility] immediately available ER MD   Medication changes reported     No   Fall or balance concerns reported    No   Warm-up and Cool-down Performed on first and last piece of equipment   VAD Patient? No   Pain Assessment   Currently in Pain? No/denies         Goals Met:  Proper associated with RPD/PD & O2 Sat Exercise tolerated well No report of cardiac concerns or symptoms Strength training completed today  Goals Unmet:  Not Applicable  Goals Comments:    Dr. Emily Filbert is Medical Director for [place] and [facility].

## 2015-04-25 ENCOUNTER — Encounter: Payer: BLUE CROSS/BLUE SHIELD | Admitting: *Deleted

## 2015-04-25 DIAGNOSIS — G4730 Sleep apnea, unspecified: Secondary | ICD-10-CM | POA: Diagnosis not present

## 2015-04-25 NOTE — Progress Notes (Unsigned)
Daily Session Note  Patient Details  Name: [name] MRN: [id number] Date of Birth: [birth year] Referring Provider:  Juluis Pitch, MD  Encounter Date: 04/25/2015  Check In:     Session Check In - 04/25/15 1112    Check-In   Staff Present Heath Lark RN, BSN, CCRP;Laureen Owens Shark BS, RRT, Respiratory Therapist;Renee Dillard [place] MS, ACSM CEP Exercise Physiologist   ER physicians immediately available to respond to emergencies [facility] immediately available ER MD   Physician(s) Dr. Glenna Durand. Archie Balboa, Dr. Reita Cliche   Medication changes reported     No   Fall or balance concerns reported    No   Warm-up and Cool-down Performed on first and last piece of equipment   VAD Patient? No   Pain Assessment   Currently in Pain? No/denies         Goals Met:  Proper associated with RPD/PD & O2 Sat Personal goals reviewed  Goals Unmet:  Not Applicable  Goals Comments:    Dr. Emily Filbert is Medical Director for [place] and [facility].

## 2015-04-28 ENCOUNTER — Encounter: Payer: BLUE CROSS/BLUE SHIELD | Admitting: *Deleted

## 2015-04-28 DIAGNOSIS — G4730 Sleep apnea, unspecified: Secondary | ICD-10-CM | POA: Diagnosis not present

## 2015-04-28 NOTE — Progress Notes (Signed)
Daily Session Note  Patient Details  Name: [name] MRN: [id number] Date of Birth: August 16, 1956 Referring Provider:  Juluis Pitch, MD  Encounter Date: 04/28/2015  Check In:     Session Check In - 04/28/15 1053    Check-In   Staff Present Laureen Owens Shark BS, RRT, Respiratory Therapist;Renee Dillard [place] MS, ACSM CEP Exercise Physiologist;Justus Droke Alfonso Patten, ACSM CEP Exercise Physiologist;Steven Way BS, ACSM EP-C, Exercise Physiologist   ER physicians immediately available to respond to emergencies [facility] immediately available ER MD   Physician(s) Jacqualine Code and [place]   Medication changes reported     No   Fall or balance concerns reported    No   Warm-up and Cool-down Performed on first and last piece of equipment   VAD Patient? No   Pain Assessment   Currently in Pain? No/denies   Multiple Pain Sites No           Exercise Prescription Changes - 04/28/15 1000    Exercise Review   Progression Yes   Treadmill   MPH 2.7   Grade --  Interval Training: Elevation ranges for 1-5%   Recumbant Elliptical   Level 5.5   Watts 65   Minutes 15      Goals Met:  Proper associated with RPD/PD & O2 Sat Independence with exercise equipment Exercise tolerated well Personal goals reviewed Strength training completed today  Goals Unmet:  Not Applicable  Goals Comments:    Dr. Emily Filbert is Medical Director for [place] and [facility].

## 2015-04-30 ENCOUNTER — Encounter: Payer: BLUE CROSS/BLUE SHIELD | Admitting: Respiratory Therapy

## 2015-04-30 DIAGNOSIS — G4730 Sleep apnea, unspecified: Secondary | ICD-10-CM

## 2015-04-30 NOTE — Progress Notes (Signed)
Daily Session Note  Patient Details  Name: CORMICK MOSS MRN: [id number] Date of Birth: 1956/01/04 Referring Provider:  Juluis Pitch, MD  Encounter Date: 04/30/2015  Check In:     Session Check In - 04/30/15 1000    Check-In   Staff Present Lestine Box BS, ACSM EP-C, Exercise Physiologist;Renee Dillard [place] MS, ACSM CEP Exercise Physiologist;Laureen Janell Quiet, RRT, Respiratory Therapist   ER physicians immediately available to respond to emergencies [facility] immediately available ER MD   Physician(s) Clearnce Hasten and Archie Balboa   Medication changes reported     No   Fall or balance concerns reported    No   Warm-up and Cool-down Performed on first and last piece of equipment   VAD Patient? No   Pain Assessment   Currently in Pain? No/denies         Goals Met:  Proper associated with RPD/PD & O2 Sat Independence with exercise equipment Using PLB without cueing & demonstrates good technique Exercise tolerated well Strength training completed today  Goals Unmet:  Not Applicable  Goals Comments:   Dr. Emily Filbert is Medical Director for [place] and [facility].

## 2015-05-02 ENCOUNTER — Encounter: Payer: BLUE CROSS/BLUE SHIELD | Admitting: *Deleted

## 2015-05-02 DIAGNOSIS — G4730 Sleep apnea, unspecified: Secondary | ICD-10-CM

## 2015-05-02 NOTE — Progress Notes (Signed)
Daily Session Note  Patient Details  Name: [name] MRN: [id number] Date of Birth: [birth year] Referring Provider:  Juluis Pitch, MD  Encounter Date: 05/02/2015  Check In:     Session Check In - 05/02/15 1058    Check-In   Staff Present Nyoka Cowden RN;Carroll Enterkin RN, Drusilla Kanner MS, ACSM CEP Exercise Physiologist   ER physicians immediately available to respond to emergencies [facility] immediately available ER MD   Physician(s) Jacqualine Code and Jimmye Norman   Medication changes reported     No   Fall or balance concerns reported    No   Warm-up and Cool-down Performed on first and last piece of equipment   VAD Patient? No   Pain Assessment   Currently in Pain? No/denies   Multiple Pain Sites No         Goals Met:  Proper associated with RPD/PD & O2 Sat Independence with exercise equipment Using PLB without cueing & demonstrates good technique Exercise tolerated well Strength training completed today  Goals Unmet:  Not Applicable  Goals Comments:    Dr. Emily Filbert is Medical Director for [place] and [facility].

## 2015-05-05 ENCOUNTER — Encounter: Payer: BLUE CROSS/BLUE SHIELD | Admitting: *Deleted

## 2015-05-05 DIAGNOSIS — G4730 Sleep apnea, unspecified: Secondary | ICD-10-CM

## 2015-05-05 NOTE — Progress Notes (Unsigned)
Daily Session Note  Patient Details  Name: [name] MRN: [id number] Date of Birth: May 15, 1956 Referring Provider:  Juluis Pitch, MD  Encounter Date: 05/05/2015  Check In:     Session Check In - 05/05/15 1059    Check-In   Staff Present Candiss Norse MS, ACSM CEP Exercise Physiologist;Kandra Graven Alfonso Patten, ACSM CEP Exercise Physiologist;Laureen Janell Quiet, RRT, Respiratory Therapist;Steven Way BS, ACSM EP-C, Exercise Physiologist   ER physicians immediately available to respond to emergencies [facility] immediately available ER MD   Physician(s) Archie Balboa and Clearnce Hasten   Medication changes reported     No   Fall or balance concerns reported    No   Warm-up and Cool-down Performed on first and last piece of equipment   VAD Patient? No   Pain Assessment   Currently in Pain? No/denies   Multiple Pain Sites No           Exercise Prescription Changes - 05/05/15 1100    Exercise Review   Progression Yes   Treadmill   MPH 2.7   Grade --  Interval Training: Elevation ranges for 1-5%   NuStep   Level 6  T5 NS   Watts 70   Recumbant Elliptical   Level 5.5   Watts 65   Minutes 15      Goals Met:  Proper associated with RPD/PD & O2 Sat Independence with exercise equipment Exercise tolerated well Personal goals reviewed Strength training completed today  Goals Unmet:  Not Applicable  Goals Comments:    Dr. Emily Filbert is Medical Director for [place] and [facility].

## 2015-05-07 ENCOUNTER — Encounter: Payer: BLUE CROSS/BLUE SHIELD | Admitting: *Deleted

## 2015-05-07 DIAGNOSIS — G4730 Sleep apnea, unspecified: Secondary | ICD-10-CM | POA: Diagnosis not present

## 2015-05-07 NOTE — Progress Notes (Signed)
Daily Session Note  Patient Details  Name: [name] MRN: [id number] Date of Birth: 10-06-1956 Referring Provider:  Juluis Pitch, MD  Encounter Date: 05/07/2015  Check In:     Session Check In - 05/07/15 1057    Check-In   Staff Present Lestine Box BS, ACSM EP-C, Exercise Physiologist;Stevee Valenta Dillard Essex MS, ACSM CEP Exercise Physiologist;Laureen Janell Quiet, RRT, Respiratory Therapist   ER physicians immediately available to respond to emergencies [facility] immediately available ER MD   Physician(s) Edd Fabian and Lord   Medication changes reported     No   Fall or balance concerns reported    No   Warm-up and Cool-down Performed on first and last piece of equipment   VAD Patient? No   Pain Assessment   Currently in Pain? No/denies   Multiple Pain Sites No         Goals Met:  Proper associated with RPD/PD & O2 Sat Independence with exercise equipment Using PLB without cueing & demonstrates good technique Exercise tolerated well Strength training completed today  Goals Unmet:  Not Applicable  Goals Comments:   Dr. Emily Filbert is Medical Director for [place] and [facility].

## 2015-05-09 ENCOUNTER — Encounter: Payer: BLUE CROSS/BLUE SHIELD | Admitting: *Deleted

## 2015-05-09 DIAGNOSIS — G4730 Sleep apnea, unspecified: Secondary | ICD-10-CM

## 2015-05-09 NOTE — Progress Notes (Signed)
Daily Session Note  Patient Details  Name: ELFEGO GIAMMARINO MRN: [id number] Date of Birth: 12-02-1955 Referring Provider:  Juluis Pitch, MD  Encounter Date: 05/09/2015  Check In:     Session Check In - 05/09/15 1108    Check-In   Staff Present Heath Lark RN, BSN, CCRP;Jonnathan Birman [place] MS, ACSM CEP Exercise Physiologist;Stacey Blanch Media RRT, RCP Respiratory Therapist   ER physicians immediately available to respond to emergencies [facility] immediately available ER MD   Physician(s) Corky Downs and Joni Fears   Medication changes reported     No   Fall or balance concerns reported    No   Warm-up and Cool-down Performed on first and last piece of equipment   VAD Patient? No   Pain Assessment   Currently in Pain? No/denies   Multiple Pain Sites No         Goals Met:  Proper associated with RPD/PD & O2 Sat Independence with exercise equipment Using PLB without cueing & demonstrates good technique Exercise tolerated well Personal goals reviewed Strength training completed today  Goals Unmet:  Not Applicable  Goals Comments:   Dr. Emily Filbert is Medical Director for [place] and [facility].

## 2015-05-10 ENCOUNTER — Encounter: Payer: Self-pay | Admitting: Cardiovascular Disease

## 2015-05-12 ENCOUNTER — Encounter: Payer: BLUE CROSS/BLUE SHIELD | Admitting: *Deleted

## 2015-05-12 DIAGNOSIS — G4730 Sleep apnea, unspecified: Secondary | ICD-10-CM

## 2015-05-12 NOTE — Progress Notes (Unsigned)
Pulmonary Individual Treatment Plan  Patient Details  Name: [name] MRN: [id number] Date of Birth: Sep 26, 1956 Referring Provider:  Dorothey Baseman, MD  Initial Encounter Date:    Visit Diagnosis: Morbid obesity  Sleep apnea  Patient's Home Medications on Admission:  Current outpatient prescriptions:  .  acetaminophen (TYLENOL) 325 MG tablet, Take 650 mg by mouth every 6 (six) hours as needed., Disp: , Rfl:  .  albuterol (PROVENTIL HFA;VENTOLIN HFA) 108 (90 BASE) MCG/ACT inhaler, Inhale into the lungs every 6 (six) hours as needed for wheezing or shortness of breath., Disp: , Rfl:  .  ALPRAZolam (XANAX) 0.5 MG tablet, Take 1/2 tablet in the am with 1 tablet in the pm., Disp: , Rfl:  .  aspirin 81 MG tablet, Take 81 mg by mouth daily., Disp: , Rfl:  .  atorvastatin (LIPITOR) 40 MG tablet, Take 40 mg by mouth daily., Disp: , Rfl:  .  buPROPion (WELLBUTRIN SR) 150 MG 12 hr tablet, Take 150 mg by mouth 2 (two) times daily., Disp: , Rfl:  .  clopidogrel (PLAVIX) 75 MG tablet, Take 75 mg by mouth daily., Disp: , Rfl:  .  fluticasone-salmeterol (ADVAIR HFA) 115-21 MCG/ACT inhaler, Inhale 2 puffs into the lungs 2 (two) times daily., Disp: , Rfl:  .  furosemide (LASIX) 20 MG tablet, Take 20 mg by mouth., Disp: , Rfl:  .  gabapentin (NEURONTIN) 300 MG capsule, Take 300 mg by mouth 3 (three) times daily., Disp: , Rfl:  .  ibuprofen (ADVIL,MOTRIN) 200 MG tablet, Take 200 mg by mouth every 6 (six) hours as needed., Disp: , Rfl:  .  metFORMIN (GLUCOPHAGE) 850 MG tablet, Take 850 mg by mouth daily with breakfast. , Disp: , Rfl:  .  metoprolol tartrate (LOPRESSOR) 25 MG tablet, Take 12.5 mg by mouth 2 (two) times daily., Disp: , Rfl:  .  nitroGLYCERIN (NITROSTAT) 0.4 MG SL tablet, Place 0.4 mg under the tongue every 5 (five) minutes as needed for chest pain., Disp: , Rfl:  .  potassium chloride SA (K-DUR,KLOR-CON) 20 MEQ tablet, Take 20 mEq by mouth daily. , Disp: , Rfl:  .  RABEprazole  (ACIPHEX) 20 MG tablet, Take 20 mg by mouth 2 (two) times daily. , Disp: , Rfl:  .  ranitidine (ZANTAC) 150 MG capsule, Take 150 mg by mouth 2 (two) times daily., Disp: , Rfl:  .  ranolazine (RANEXA) 1000 MG SR tablet, Take 1 tablet (1,000 mg total) by mouth 2 (two) times daily., Disp: 60 tablet, Rfl: 6 .  senna (SENOKOT) 8.6 MG tablet, Take 1 tablet by mouth daily., Disp: , Rfl:  .  venlafaxine (EFFEXOR) 75 MG tablet, Take 75 mg by mouth 2 (two) times daily., Disp: , Rfl:   Past Medical History: Past Medical History  Diagnosis Date  . Type 2 diabetes mellitus   . Coronary artery disease   . GERD (gastroesophageal reflux disease)   . Hyperlipidemia   . Hypertension   . Atherosclerosis of coronary artery bypass graft with angina pectoris   . Sleep apnea   . Barrett's esophagus   . Anxiety   . H/O esophageal spasm   . Chronic constipation   . Colon polyp   . MI (myocardial infarction)     Tobacco Use: History  Smoking status  . Never Smoker   Smokeless tobacco  . Not on file    Labs: Recent Review Flowsheet Data    Labs for ITP Cardiac and Pulmonary Rehab Latest Ref Rng 04/22/2009  11/18/2009 11/19/2009   Cholestrol 0 - 200 mg/dL - - 161 ATP III CLASSIFICATION: <200     mg/dL   Desirable 096-045  mg/dL   Borderline High >=409    mg/dL   High   LDLCALC 0 - 99 mg/dL - - 70 Total Cholesterol/HDL:CHD Risk Coronary Heart Disease Risk Table Men   Women 1/2 Average Risk   3.4   3.3 Average Risk       5.0   4.4 2 X Average Risk   9.6   7.1 3 X Average Risk  23.4   11.0 Use the calculated Patient Ratio above and the CHD Risk Table to determine the patient's CHD Risk. ATP III CLASSIFICATION (LDL): <100     mg/dL   Optimal 811-914  mg/dL   Near or Above Optimal 130-159  mg/dL   Borderline 782-956  mg/dL   High >213     mg/dL   Very High   HDL >08 mg/dL - - 65(H)   Trlycerides <150 mg/dL - - 75   Hemoglobin Q4O 4.6 - 6.1 % - 5.7 (NOTE) The ADA recommends the following  therapeutic goal for glycemic control related to Hgb A1c measurement: Goal of therapy: <6.5 Hgb A1c  Reference: American Diabetes Association: Clinical Practice Recommendations 2010, Diabetes Care, 2010, 33: (Suppl 1). -   TCO2 0 - 100 mmol/L 21 - -         POCT Glucose      04/16/15 1629           POCT Blood Glucose   Pre-Exercise 133 mg/dL       Post-Exercise 962 mg/dL       Pre-Exercise #2 952 mg/dL       Post-Exercise #2 128 mg/dL       Pre-Exercise #3 841 mg/dL       Post-Exercise #3 152 mg/dL          ADL UCSD:     ADL UCSD      02/25/15 0900 04/16/15 1000     ADL UCSD   ADL Phase Entry Mid    SOB Score total 79 46    Rest 2 0    Walk 2 2    Stairs 5 4    Bath 2 0    Dress 1 0    Shop 2 3        Pulmonary Function Assessment:   Exercise Target Goals:    Exercise Program Goal: Individual exercise prescription set with THRR, safety & activity barriers. Participant demonstrates ability to understand and report RPE using BORG scale, to self-measure pulse accurately, and to acknowledge the importance of the exercise prescription.  Exercise Prescription Goal: Starting with aerobic activity 30 plus minutes a day, 3 days per week for initial exercise prescription. Provide home exercise prescription and guidelines that participant acknowledges understanding prior to discharge.  Activity Barriers & Risk Stratification:   6 Minute Walk:     6 Minute Walk      02/25/15 0900 04/16/15 1038     6 Minute Walk   Phase Initial Mid Program    Distance 1475 feet 1600 feet    Walk Time 6 minutes 6 minutes    Resting HR 76 bpm 64 bpm    Resting BP 122/72 mmHg 122/72 mmHg    Max Ex. HR 97 bpm 103 bpm    Max Ex. BP 144/70 mmHg 148/82 mmHg    RPE 15 18    Perceived Dyspnea  4  6    Symptoms  No       Initial Exercise Prescription:   Exercise Prescription Changes:     Exercise Prescription Changes      03/24/15 1400 03/26/15 1100 03/28/15 1000 03/31/15  1100 04/14/15 1100   Exercise Review   Progression    Yes Yes   Response to Exercise   Blood Pressure (Admit)     110/68 mmHg   Blood Pressure (Exercise)     132/74 mmHg   Blood Pressure (Exit)     120/82 mmHg   Heart Rate (Admit)     76 bpm   Heart Rate (Exercise)     94 bpm   Heart Rate (Exit)     83 bpm   Oxygen Saturation (Admit)     100 %   Oxygen Saturation (Exercise)     97 %   Oxygen Saturation (Exit)     100 %   Rating of Perceived Exertion (Exercise)     15   Perceived Dyspnea (Exercise)     4   Resistance Training   Training Prescription     Yes   Weight     5   Reps     10-12   Treadmill   MPH    2.6 2.6   Grade 1 1  1 1    Minutes     15   NuStep   Level    5 5   Watts    60 60   Minutes     10   Recumbant Elliptical   Level  4.5   4.5   Watts  55 60  60   Minutes     15     04/23/15 1100 04/28/15 1000 05/05/15 1100 05/07/15 1500 05/12/15 1100   Exercise Review   Progression Yes Yes Yes Yes Yes   Response to Exercise   Blood Pressure (Admit)    110/70 mmHg 110/70 mmHg   Blood Pressure (Exercise)    138/76 mmHg 138/76 mmHg   Blood Pressure (Exit)    132/68 mmHg 132/68 mmHg   Heart Rate (Admit)    68 bpm 68 bpm   Heart Rate (Exercise)    102 bpm 102 bpm   Heart Rate (Exit)    79 bpm 79 bpm   Oxygen Saturation (Admit)    100 % 100 %   Oxygen Saturation (Exercise)    99 % 99 %   Oxygen Saturation (Exit)    98 % 98 %   Rating of Perceived Exertion (Exercise)    12 12   Perceived Dyspnea (Exercise)    3 3   Treadmill   MPH 2.7 2.7 2.7     Grade 1 --  Interval Training: Elevation ranges for 1-5% --  Interval Training: Elevation ranges for 1-5%     Minutes 15       NuStep   Level   6  T5 NS     Watts   70     Recumbant Elliptical   Level  5.5 5.5     Watts  65 65  70   Minutes  15 15        Discharge Exercise Prescription:    Nutrition:  Target Goals: Understanding of nutrition guidelines, daily intake of sodium 1500mg , cholesterol 200mg ,  calories 30% from fat and 7% or less from saturated fats, daily to have 5 or more servings of fruits and vegetables.  Biometrics:    Nutrition  Therapy Plan and Nutrition Goals:   Nutrition Discharge: Rate Your Plate Scores:   Psychosocial: Target Goals: Acknowledge presence or absence of depression, maximize coping skills, provide positive support system. Participant is able to verbalize types and ability to use techniques and skills needed for reducing stress and depression.  Initial Review & Psychosocial Screening:   Quality of Life Scores:   PHQ-9:     Recent Review Flowsheet Data    There is no flowsheet data to display.      Psychosocial Evaluation and Intervention:   Psychosocial Re-Evaluation:  Education: Education Goals: Education classes will be provided on a weekly basis, covering required topics. Participant will state understanding/return demonstration of topics presented.  Learning Barriers/Preferences:   Education Topics: Initial Evaluation Education: - Verbal, written and demonstration of respiratory meds, RPE/PD scales, oximetry and breathing techniques. Instruction on use of nebulizers and MDIs: cleaning and proper use, rinsing mouth with steroid doses and importance of monitoring MDI activations.   General Nutrition Guidelines/Fats and Fiber: -Group instruction provided by verbal, written material, models and posters to present the general guidelines for heart healthy nutrition. Gives an explanation and review of dietary fats and fiber.   Controlling Sodium/Reading Food Labels: -Group verbal and written material supporting the discussion of sodium use in heart healthy nutrition. Review and explanation with models, verbal and written materials for utilization of the food label.   Exercise Physiology & Risk Factors: - Group verbal and written instruction with models to review the exercise physiology of the cardiovascular system and associated  critical values. Details cardiovascular disease risk factors and the goals associated with each risk factor.   Aerobic Exercise & Resistance Training: - Gives group verbal and written discussion on the health impact of inactivity. On the components of aerobic and resistive training programs and the benefits of this training and how to safely progress through these programs.   Flexibility, Balance, General Exercise Guidelines: - Provides group verbal and written instruction on the benefits of flexibility and balance training programs. Provides general exercise guidelines with specific guidelines to those with heart or lung disease. Demonstration and skill practice provided.      Most Recent Value   Date  05/12/15   Educator  L. Brown,RT   Instruction Review Code  2- meets goals/outcomes      Stress Management: - Provides group verbal and written instruction about the health risks of elevated stress, cause of high stress, and healthy ways to reduce stress.      Most Recent Value   Date  04/23/15   Educator  [facility]   Instruction Review Code  2- meets goals/outcomes      Depression: - Provides group verbal and written instruction on the correlation between heart/lung disease and depressed mood, treatment options, and the stigmas associated with seeking treatment.      Most Recent Value   Date  05/07/15   Educator  [facility]   Instruction Review Code  2- meets goals/outcomes      Exercise & Equipment Safety: - Individual verbal instruction and demonstration of equipment use and safety with use of the equipment.   Infection Prevention: - Provides verbal and written material to individual with discussion of infection control including proper hand washing and proper equipment cleaning during exercise session.   Falls Prevention: - Provides verbal and written material to individual with discussion of falls prevention and safety.   Diabetes: - Individual verbal and written instruction to  review signs/symptoms of diabetes, desired ranges of glucose  level fasting, after meals and with exercise. Advice that pre and post exercise glucose checks will be done for 3 sessions at entry of program.      Most Recent Value   Date  05/02/15   Educator  CE   Instruction Review Code  2- meets goals/outcomes      Chronic Lung Diseases: - Group verbal and written instruction to review new updates, new respiratory medications, new advancements in procedures and treatments. Provide informative websites and "800" numbers of self-education.      Most Recent Value   Date  03/31/15   Educator  [facility]   Instruction Review Code  2- meets goals/outcomes      Lung Procedures: - Group verbal and written instruction to describe testing methods done to diagnose lung disease. Review the outcome of test results. Describe the treatment choices: Pulmonary Function Tests, ABGs and oximetry.   Energy Conservation: - Provide group verbal and written instruction for methods to conserve energy, plan and organize activities. Instruct on pacing techniques, use of adaptive equipment and posture/positioning to relieve shortness of breath.      Most Recent Value   Date  04/25/15   Educator  [facility]   Instruction Review Code  2- meets goals/outcomes      Triggers: - Group verbal and written instruction to review types of environmental controls: home humidity, furnaces, filters, dust mite/pet prevention, HEPA vacuums. To discuss weather changes, air quality and the benefits of nasal washing.   Exacerbations: - Group verbal and written instruction to provide: warning signs, infection symptoms, calling MD promptly, preventive modes, and value of vaccinations. Review: effective airway clearance, coughing and/or vibration techniques. Create an Sport and exercise psychologist.      Most Recent Value   Date  04/28/15   Educator  L. Manson Passey   Instruction Review Code  2- meets goals/outcomes      Oxygen: - Individual and group  verbal and written instruction on oxygen therapy. Includes supplement oxygen, available portable oxygen systems, continuous and intermittent flow rates, oxygen safety, concentrators, and Medicare reimbursement for oxygen.   Respiratory Medications: - Group verbal and written instruction to review medications for lung disease. Drug class, frequency, complications, importance of spacers, rinsing mouth after steroid MDI's, and proper cleaning methods for nebulizers.   AED/CPR: - Group verbal and written instruction with the use of models to demonstrate the basic use of the AED with the basic ABC's of resuscitation.      Most Recent Value   Date  04/18/15   Educator  [facility]   Instruction Review Code  2- meets goals/outcomes      Breathing Retraining: - Provides individuals verbal and written instruction on purpose, frequency, and proper technique of diaphragmatic breathing and pursed-lipped breathing. Applies individual practice skills.   Anatomy and Physiology of the Lungs: - Group verbal and written instruction with the use of models to provide basic lung anatomy and physiology related to function, structure and complications of lung disease.   Heart Failure: - Group verbal and written instruction on the basics of heart failure: signs/symptoms, treatments, explanation of ejection fraction, enlarged heart and cardiomyopathy.   Sleep Apnea: - Individual verbal and written instruction to review Obstructive Sleep Apnea. Review of risk factors, methods for diagnosing and types of masks and machines for OSA.   Anxiety: - Provides group, verbal and written instruction on the correlation between heart/lung disease and anxiety, treatment options, and management of anxiety.   Relaxation: - Provides group, verbal and written instruction about  the benefits of relaxation for patients with heart/lung disease. Also provides patients with examples of relaxation techniques.   Knowledge  Questionnaire Score:   Personal Goals and Risk Factors at Admission:     Personal Goals and Risk Factors at Admission - 02/25/15 0900    Personal Goals and Risk Factors on Admission    Weight Management Yes   Intervention Learn and follow the exercise and diet guidelines while in the program. Utilize the nutrition and education classes to help gain knowledge of the diet and exercise expectations in the program   Admit Weight 290 lb (131.543 kg)   Goal Weight 260 lb (117.935 kg)   Increase Aerobic Exercise and Physical Activity Yes   Intervention While in program, learn and follow the exercise prescription taught. Start at a low level workload and increase workload after able to maintain previous level for 30 minutes. Increase time before increasing intensity.   Understand more about Heart/Pulmonary Disease. Yes   Intervention While in program utilize professionals for any questions, and attend the education sessions. Great websites to use are www.americanheart.org or www.lung.org for reliable information.   Improve shortness of breath with ADL's Yes   Intervention While in program, learn and follow the exercise prescription taught. Start at a low level workload and increase workload ad advised by the exercise physiologist. Increase time before increasing intensity.   Develop more efficient breathing techniques such as purse lipped breathing and diaphragmatic breathing; and practicing self-pacing with activity Yes   Intervention While in program, learn and utilize the specific breathing techniques taught to you. Continue to practice and use the techniques as needed.   Increase knowledge of respiratory medications and ability to use respiratory devices properly.  Yes   Intervention While in program, learn to administer MDI, nebulizer, and spacer properly.;Learn to take respiratory medicine as ordered.;While in program, learn to Clean MDI, nebulizers, and spacers properly.   Diabetes Yes   Goal  Blood glucose control identified by blood glucose values, HgbA1C. Participant verbalizes understanding of the signs/symptoms of hyper/hypo glycemia, proper foot care and importance of medication and nutrition plan for blood glucose control.   Intervention Provide nutrition & aerobic exercise along with prescribed medications to achieve blood glucose in normal ranges: Fasting 65-99 mg/dL   Hypertension Yes   Goal Participant will see blood pressure controlled within the values of 140/73mm/Hg or within value directed by their physician.   Intervention Provide nutrition & aerobic exercise along with prescribed medications to achieve BP 140/90 or less.   Lipids Yes   Goal Cholesterol controlled with medications as prescribed, with individualized exercise RX and with personalized nutrition plan. Value goals: LDL < 70mg , HDL > 40mg . Participant states understanding of desired cholesterol values and following prescriptions.   Intervention Provide nutrition & aerobic exercise along with prescribed medications to achieve LDL 70mg , HDL >40mg .      Personal Goals and Risk Factors Review:      Goals and Risk Factor Review      04/16/15 1640 04/28/15 1014 04/30/15 1000 05/09/15 1535     Increase Aerobic Exercise and Physical Activity   Goals Progress/Improvement seen  Yes Yes      Comments Improved mid by 131ft Started Eliezer on interval training on the treadmill to continue to progress him aerobically. He said he still gets winded on any incline, therefore the intervals involve progressively taking the incline up and down.      Understand more about Heart/Pulmonary Disease   Goals Progress/Improvement  seen    Yes     Comments   Mr Godshall has attended the educational classes and states the information has been very helpful in managing his lung disease.     Improve shortness of breath with ADL's   Goals Progress/Improvement seen  Yes   Yes    Comments Improved Shortness of Breath Questionnaire by  33 points   Mr. Simson states that it is easier to preform his daily routine.  He is also finding that he can walk longer distances with less SOB.    Increase knowledge of respiratory medications   Goals Progress/Improvement seen    Yes     Comments   Mr Cordoba uses his Advair MDI and Ventolin MDI properly.        Personal Goals Discharge:    Comments: 30 day review

## 2015-05-12 NOTE — Progress Notes (Unsigned)
Daily Session Note  Patient Details  Name: [name] MRN: [id number] Date of Birth: 1956-05-24 Referring Provider:  Juluis Pitch, MD  Encounter Date: 05/12/2015  Check In:     Session Check In - 05/12/15 1104    Check-In   Staff Present Carson Myrtle BS, RRT, Respiratory Therapist;Renee Dillard [place] MS, ACSM CEP Exercise Physiologist;Kelly Alfonso Patten, ACSM CEP Exercise Physiologist   ER physicians immediately available to respond to emergencies [facility] immediately available ER MD   Physician(s) Dr Joni Fears and Dr. Corky Downs   Medication changes reported     No   Fall or balance concerns reported    No   Warm-up and Cool-down Performed on first and last piece of equipment   VAD Patient? No   Pain Assessment   Currently in Pain? No/denies         Goals Met:  Proper associated with RPD/PD & O2 Sat Exercise tolerated well  Goals Unmet:  Not Applicable  Goals Comments:    Dr. Emily Filbert is Medical Director for [place] and [facility].

## 2015-05-14 ENCOUNTER — Encounter: Payer: BLUE CROSS/BLUE SHIELD | Admitting: *Deleted

## 2015-05-14 DIAGNOSIS — G4730 Sleep apnea, unspecified: Secondary | ICD-10-CM

## 2015-05-14 NOTE — Progress Notes (Signed)
Daily Session Note  Patient Details  Name: [name] MRN: [id number] Date of Birth: 02-26-1956 Referring Provider:  Juluis Pitch, MD  Encounter Date: 05/14/2015  Check In:     Session Check In - 05/14/15 1204    Check-In   Staff Present Candiss Norse MS, ACSM CEP Exercise Physiologist;Steven Way BS, ACSM EP-C, Exercise Physiologist;Laureen Janell Quiet, RRT, Respiratory Therapist   ER physicians immediately available to respond to emergencies [facility] immediately available ER MD   Physician(s) Thomasene Lot and Reita Cliche   Medication changes reported     No   Fall or balance concerns reported    Yes   Comments [place] on Monday 05/13/15 at his house    Warm-up and Cool-down Performed on first and last piece of equipment   VAD Patient? No   Pain Assessment   Currently in Pain? Yes   Pain Descriptors / Indicators Sore   Pain Type Acute pain   Pain Onset In the past 7 days   Aggravating Factors  due to multiple falls in the last two weeks   Multiple Pain Sites No         Goals Met:  Proper associated with RPD/PD & O2 Sat Independence with exercise equipment Using PLB without cueing & demonstrates good technique Exercise tolerated well Personal goals reviewed Strength training completed today  Goals Unmet:  Not Applicable  Goals Comments:   Dr. Emily Filbert is Medical Director for [place] and [facility].

## 2015-05-16 ENCOUNTER — Encounter: Payer: BLUE CROSS/BLUE SHIELD | Admitting: *Deleted

## 2015-05-16 DIAGNOSIS — G4730 Sleep apnea, unspecified: Secondary | ICD-10-CM

## 2015-05-16 NOTE — Progress Notes (Signed)
Daily Session Note  Patient Details  Name: BRITTIAN RENALDO MRN: [id number] Date of Birth: 1956/05/05 Referring Provider:  Juluis Pitch, MD  Encounter Date: 05/16/2015  Check In:     Session Check In - 05/16/15 1033    Check-In   Staff Present Heath Lark RN, BSN, CCRP;Thoams Siefert [place] MS, ACSM CEP Exercise Physiologist;Stacey Blanch Media RRT, RCP Respiratory Therapist   ER physicians immediately available to respond to emergencies [facility] immediately available ER MD   Physician(s) Archie Balboa and Corky Downs   Medication changes reported     No   Fall or balance concerns reported    No   Warm-up and Cool-down Performed on first and last piece of equipment   VAD Patient? No   Pain Assessment   Currently in Pain? No/denies   Multiple Pain Sites No         Goals Met:  Independence with exercise equipment Using PLB without cueing & demonstrates good technique Exercise tolerated well Strength training completed today  Goals Unmet:  Not Applicable  Goals Comments:   Dr. Emily Filbert is Medical Director for [place] and [facility].

## 2015-05-19 ENCOUNTER — Encounter: Payer: BLUE CROSS/BLUE SHIELD | Admitting: *Deleted

## 2015-05-19 DIAGNOSIS — G4730 Sleep apnea, unspecified: Secondary | ICD-10-CM | POA: Diagnosis not present

## 2015-05-19 NOTE — Progress Notes (Signed)
Daily Session Note  Patient Details  Name: [name] MRN: [id number] Date of Birth: 1956-06-08 Referring Provider:  Juluis Pitch, MD  Encounter Date: 05/19/2015  Check In:     Session Check In - 05/19/15 1048    Check-In   Staff Present Earlean Shawl BS, ACSM CEP Exercise Physiologist;Laureen Janell Quiet, RRT, Respiratory Therapist;Steven Way BS, ACSM EP-C, Exercise Physiologist   ER physicians immediately available to respond to emergencies [facility] immediately available ER MD   Physician(s) Dr. Edd Fabian and Dr. Reita Cliche   Medication changes reported     No   Fall or balance concerns reported    No   Warm-up and Cool-down Performed on first and last piece of equipment   VAD Patient? No   Pain Assessment   Currently in Pain? No/denies   Multiple Pain Sites No           Exercise Prescription Changes - 05/19/15 1000    Exercise Review   Progression Yes   Treadmill   MPH 2.7   Grade --  Interval Training: Elevation ranges for 1-5%   NuStep   Level 6  T5 NS   Watts 70   Recumbant Elliptical   Level 5   Watts 80   Minutes 15      Goals Met:  Proper associated with RPD/PD & O2 Sat Independence with exercise equipment Exercise tolerated well Personal goals reviewed Strength training completed today  Goals Unmet:  Not Applicable  Goals Comments:    Dr. Emily Filbert is Medical Director for [place] and [facility].

## 2015-05-21 ENCOUNTER — Encounter: Payer: Self-pay | Admitting: Internal Medicine

## 2015-05-21 DIAGNOSIS — G4730 Sleep apnea, unspecified: Secondary | ICD-10-CM | POA: Diagnosis not present

## 2015-05-21 NOTE — Progress Notes (Signed)
Daily Session Note  Patient Details  Name: [name] MRN: [id number] Date of Birth: [birth year] Referring Provider:  Juluis Pitch, MD  Encounter Date: 05/21/2015  Check In:     Session Check In - 05/21/15 1052    Check-In   Staff Present Candiss Norse MS, ACSM CEP Exercise Physiologist;Topacio Cella BS, ACSM EP-C, Exercise Physiologist;Laureen Janell Quiet, RRT, Respiratory Therapist   ER physicians immediately available to respond to emergencies [facility] immediately available ER MD   Physician(s) goodman and stafford   Medication changes reported     No   Fall or balance concerns reported    No   Warm-up and Cool-down Performed on first and last piece of equipment   VAD Patient? No   Pain Assessment   Currently in Pain? No/denies         Goals Met:  Proper associated with RPD/PD & O2 Sat Exercise tolerated well No report of cardiac concerns or symptoms Strength training completed today  Goals Unmet:  Not Applicable  Goals Comments:    Dr. Emily Filbert is Medical Director for [place] and [facility].

## 2015-05-23 ENCOUNTER — Encounter: Payer: BLUE CROSS/BLUE SHIELD | Attending: Family Medicine | Admitting: *Deleted

## 2015-05-23 DIAGNOSIS — G4730 Sleep apnea, unspecified: Secondary | ICD-10-CM

## 2015-05-23 LAB — GLUCOSE, CAPILLARY: GLUCOSE-CAPILLARY: 123 mg/dL — AB (ref 65–99)

## 2015-05-23 NOTE — Progress Notes (Signed)
Daily Session Note  Patient Details  Name: [name] MRN: [id number] Date of Birth: Apr 12, 1956 Referring Provider:  Juluis Pitch, MD  Encounter Date: 05/23/2015  Check In:     Session Check In - 05/23/15 1035    Check-In   Staff Present Heath Lark RN, BSN, CCRP;Stacey Blanch Media RRT, RCP Respiratory Therapist;Renee Dillard [place] MS, ACSM CEP Exercise Physiologist   ER physicians immediately available to respond to emergencies [facility] immediately available ER MD   Physician(s) Dr. Thomasene Lot and Dr. Claudette Head   Medication changes reported     No   Fall or balance concerns reported    No   Warm-up and Cool-down Performed on first and last piece of equipment   VAD Patient? No   Pain Assessment   Currently in Pain? No/denies         Goals Met:  Proper associated with RPD/PD & O2 Sat Independence with exercise equipment Using PLB without cueing & demonstrates good technique  Goals Unmet:  Not Applicable  Goals Comments: [name] states that his legs almost gave out on him today after walking on the TM.  He said that he was having some chest pain and refused to go to the ER.  He says he has an appointment with Dr. Rockey Situ soon.  He also felt nervous and shaky. His BP= 120/80, Blood glucose=123, O2 sat=98 and HR=70.  He rested for there remainder of the class.  He says he was feeling better when he left.  He was instructed to seek medical attention if his symptoms returned.   Dr. Emily Filbert is Medical Director for [place] and [facility].

## 2015-05-28 ENCOUNTER — Encounter: Payer: Self-pay | Admitting: Internal Medicine

## 2015-05-28 DIAGNOSIS — G4730 Sleep apnea, unspecified: Secondary | ICD-10-CM | POA: Diagnosis not present

## 2015-05-28 NOTE — Progress Notes (Signed)
Pulmonary Individual Treatment Plan  Patient Details  Name: [name] MRN: [id number] Date of Birth: [birth year] Referring Provider:  Dr Julien Nordmann  Initial Encounter Date: 02/25/15  Visit Diagnosis: Morbid obesity  Sleep apnea  Patient's Home Medications on Admission:  Current outpatient prescriptions:    acetaminophen (TYLENOL) 325 MG tablet, Take 650 mg by mouth every 6 (six) hours as needed., Disp: , Rfl:    albuterol (PROVENTIL HFA;VENTOLIN HFA) 108 (90 BASE) MCG/ACT inhaler, Inhale into the lungs every 6 (six) hours as needed for wheezing or shortness of breath., Disp: , Rfl:    ALPRAZolam (XANAX) 0.5 MG tablet, Take 1/2 tablet in the am with 1 tablet in the pm., Disp: , Rfl:    aspirin 81 MG tablet, Take 81 mg by mouth daily., Disp: , Rfl:    atorvastatin (LIPITOR) 40 MG tablet, Take 40 mg by mouth daily., Disp: , Rfl:    buPROPion (WELLBUTRIN SR) 150 MG 12 hr tablet, Take 150 mg by mouth 2 (two) times daily., Disp: , Rfl:    clopidogrel (PLAVIX) 75 MG tablet, Take 75 mg by mouth daily., Disp: , Rfl:    fluticasone-salmeterol (ADVAIR HFA) 115-21 MCG/ACT inhaler, Inhale 2 puffs into the lungs 2 (two) times daily., Disp: , Rfl:    furosemide (LASIX) 20 MG tablet, Take 20 mg by mouth., Disp: , Rfl:    gabapentin (NEURONTIN) 300 MG capsule, Take 300 mg by mouth 3 (three) times daily., Disp: , Rfl:    ibuprofen (ADVIL,MOTRIN) 200 MG tablet, Take 200 mg by mouth every 6 (six) hours as needed., Disp: , Rfl:    metFORMIN (GLUCOPHAGE) 850 MG tablet, Take 850 mg by mouth daily with breakfast. , Disp: , Rfl:    metoprolol tartrate (LOPRESSOR) 25 MG tablet, Take 12.5 mg by mouth 2 (two) times daily., Disp: , Rfl:    nitroGLYCERIN (NITROSTAT) 0.4 MG SL tablet, Place 0.4 mg under the tongue every 5 (five) minutes as needed for chest pain., Disp: , Rfl:    potassium chloride SA (K-DUR,KLOR-CON) 20 MEQ tablet, Take 20 mEq by mouth daily. , Disp: , Rfl:    RABEprazole  (ACIPHEX) 20 MG tablet, Take 20 mg by mouth 2 (two) times daily. , Disp: , Rfl:    ranitidine (ZANTAC) 150 MG capsule, Take 150 mg by mouth 2 (two) times daily., Disp: , Rfl:    ranolazine (RANEXA) 1000 MG SR tablet, Take 1 tablet (1,000 mg total) by mouth 2 (two) times daily., Disp: 60 tablet, Rfl: 6   senna (SENOKOT) 8.6 MG tablet, Take 1 tablet by mouth daily., Disp: , Rfl:    venlafaxine (EFFEXOR) 75 MG tablet, Take 75 mg by mouth 2 (two) times daily., Disp: , Rfl:   Past Medical History: Past Medical History  Diagnosis Date   Type 2 diabetes mellitus    Coronary artery disease    GERD (gastroesophageal reflux disease)    Hyperlipidemia    Hypertension    Atherosclerosis of coronary artery bypass graft with angina pectoris    Sleep apnea    Barrett's esophagus    Anxiety    H/O esophageal spasm    Chronic constipation    Colon polyp    MI (myocardial infarction)     Tobacco Use: History  Smoking status   Never Smoker   Smokeless tobacco   Not on file    Labs: Recent Review Flowsheet Data    Labs for ITP Cardiac and Pulmonary Rehab Latest Ref Rng 04/22/2009 11/18/2009  11/19/2009   Cholestrol 0 - 200 mg/dL - - 161 ATP III CLASSIFICATION: <200     mg/dL   Desirable 096-045  mg/dL   Borderline High >=409    mg/dL   High   LDLCALC 0 - 99 mg/dL - - 70 Total Cholesterol/HDL:CHD Risk Coronary Heart Disease Risk Table Men   Women 1/2 Average Risk   3.4   3.3 Average Risk       5.0   4.4 2 X Average Risk   9.6   7.1 3 X Average Risk  23.4   11.0 Use the calculated Patient Ratio above and the CHD Risk Table to determine the patient's CHD Risk. ATP III CLASSIFICATION (LDL): <100     mg/dL   Optimal 811-914  mg/dL   Near or Above Optimal 130-159  mg/dL   Borderline 782-956  mg/dL   High >213     mg/dL   Very High   HDL >08 mg/dL - - 65(H)   Trlycerides <150 mg/dL - - 75   Hemoglobin Q4O 4.6 - 6.1 % - 5.7 (NOTE) The ADA recommends the following  therapeutic goal for glycemic control related to Hgb A1c measurement: Goal of therapy: <6.5 Hgb A1c  Reference: American Diabetes Association: Clinical Practice Recommendations 2010, Diabetes Care, 2010, 33: (Suppl 1). -   TCO2 0 - 100 mmol/L 21 - -         POCT Glucose      04/16/15 1629           POCT Blood Glucose   Pre-Exercise 133 mg/dL       Post-Exercise 962 mg/dL       Pre-Exercise #2 952 mg/dL       Post-Exercise #2 128 mg/dL       Pre-Exercise #3 841 mg/dL       Post-Exercise #3 152 mg/dL          ADL UCSD:     ADL UCSD      02/25/15 0900 04/16/15 1000 05/23/15 1000   ADL UCSD   ADL Phase Entry Mid Exit   SOB Score total 79 46 52   Rest 2 0 0   Walk 2 2 0   Stairs 5 4 5    Bath 2 0 0   Dress 1 0 0   Shop 2 3 3        Pulmonary Function Assessment:   Exercise Target Goals:    Exercise Program Goal: Individual exercise prescription set with THRR, safety & activity barriers. Participant demonstrates ability to understand and report RPE using BORG scale, to self-measure pulse accurately, and to acknowledge the importance of the exercise prescription.  Exercise Prescription Goal: Starting with aerobic activity 30 plus minutes a day, 3 days per week for initial exercise prescription. Provide home exercise prescription and guidelines that participant acknowledges understanding prior to discharge.  Activity Barriers & Risk Stratification:   6 Minute Walk:     6 Minute Walk      02/25/15 0900 04/16/15 1038 05/21/15 1047   6 Minute Walk   Phase Initial Mid Program Discharge   Distance 1475 feet 1600 feet 1650 feet   Walk Time 6 minutes 6 minutes 6 minutes   Resting HR 76 bpm 64 bpm 74 bpm   Resting BP 122/72 mmHg 122/72 mmHg 114/68 mmHg   Max Ex. HR 97 bpm 103 bpm 108 bpm   Max Ex. BP 144/70 mmHg 148/82 mmHg 162/72 mmHg   RPE 15 18 17  Perceived Dyspnea  Symptoms  No       Initial Exercise Prescription:   Exercise Prescription  Changes:     Exercise Prescription Changes      03/24/15 1400 03/26/15 1100 03/28/15 1000 03/31/15 1100 04/14/15 1100   Exercise Review   Progression    Yes Yes   Response to Exercise   Blood Pressure (Admit)     110/68 mmHg   Blood Pressure (Exercise)     132/74 mmHg   Blood Pressure (Exit)     120/82 mmHg   Heart Rate (Admit)     76 bpm   Heart Rate (Exercise)     94 bpm   Heart Rate (Exit)     83 bpm   Oxygen Saturation (Admit)     100 %   Oxygen Saturation (Exercise)     97 %   Oxygen Saturation (Exit)     100 %   Rating of Perceived Exertion (Exercise)     15   Perceived Dyspnea (Exercise)     4   Resistance Training   Training Prescription     Yes   Weight     5   Reps     10-12   Treadmill   MPH    2.6 2.6   Grade Minutes     15   NuStep   Level    5 5   Watts    60 60   Minutes     10   Recumbant Elliptical   Level  4.5   4.5   Watts  55 60  60   Minutes     15     04/23/15 1100 04/28/15 1000 05/05/15 1100 05/07/15 1500 05/12/15 1100   Exercise Review   Progression Yes Yes Yes Yes Yes   Response to Exercise   Blood Pressure (Admit)    110/70 mmHg 110/70 mmHg   Blood Pressure (Exercise)    138/76 mmHg 138/76 mmHg   Blood Pressure (Exit)    132/68 mmHg 132/68 mmHg   Heart Rate (Admit)    68 bpm 68 bpm   Heart Rate (Exercise)    102 bpm 102 bpm   Heart Rate (Exit)    79 bpm 79 bpm   Oxygen Saturation (Admit)    100 % 100 %   Oxygen Saturation (Exercise)    99 % 99 %   Oxygen Saturation (Exit)    98 % 98 %   Rating of Perceived Exertion (Exercise)    12 12   Perceived Dyspnea (Exercise)    3 3   Treadmill   MPH 2.7 2.7 2.7     Grade 1 --  Interval Training: Elevation ranges for 1-5% --  Interval Training: Elevation ranges for 1-5%     Minutes 15       NuStep   Level   6  T5 NS     Watts   70     Recumbant Elliptical   Level  5.5 5.5     Watts  65 65  70   Minutes  15 15       05/19/15 1000 05/21/15 1000         Exercise Review    Progression Yes Yes      Treadmill   MPH 2.7 2.7      Grade --  Interval Training: Elevation ranges for 1-5% --  Interval Training: Elevation ranges for 1-5%      NuStep   Level 6  T5 NS 6  T5 NS      Watts 70 70      Recumbant Elliptical   Level 5 5      Watts 80 80      Minutes 15 15         Discharge Exercise Prescription (Final Exercise Prescription Changes):     Exercise Prescription Changes - 05/21/15 1000    Exercise Review   Progression Yes   Treadmill   MPH 2.7   Grade --  Interval Training: Elevation ranges for 1-5%   NuStep   Level 6  T5 NS   Watts 70   Recumbant Elliptical   Level 5   Watts 80   Minutes 15       Nutrition:  Target Goals: Understanding of nutrition guidelines, daily intake of sodium 1500mg , cholesterol 200mg , calories 30% from fat and 7% or less from saturated fats, daily to have 5 or more servings of fruits and vegetables.  Biometrics:      Post Biometrics - 05/21/15 1051     Post  Biometrics   Height 5\' 10"  (1.778 m)   Weight 283 lb 6.4 oz (128.549 kg)   BMI (Calculated) 40.7      Nutrition Therapy Plan and Nutrition Goals:   Nutrition Discharge: Rate Your Plate Scores:   Psychosocial: Target Goals: Acknowledge presence or absence of depression, maximize coping skills, provide positive support system. Participant is able to verbalize types and ability to use techniques and skills needed for reducing stress and depression.  Initial Review & Psychosocial Screening:   Quality of Life Scores:  GD04 Depression Questionnaire Results      Pre 22  Post16  SF36                                                                   Pre           Post             Physical Fx     15        15                Role Fx: Physical    4  4              Bodily Pain     6  6    General Health    10  11   Vitality                 6                       14                Social Fx     6  5   Role Fx: Emotional    4  3   Mental  Health    21                    19     PHQ-9:     Recent Review Flowsheet Data    There is no flowsheet data to display.  Psychosocial Evaluation and Intervention:   Psychosocial Re-Evaluation:     Psychosocial Re-Evaluation      05/21/15 1048           Psychosocial Re-Evaluation   Comments Counselor follow up with Mr. Curfman today prior to his upcoming discharge from this program.  He reports progres experienced of walking longer distance without shortness of breath & breathing better in general.  He continues to sleep well and maintains a healthy appetite.  Mr. Montour reports he has benefitted from the educational components of this program as well; particularly setting realistic goals and being more patient with himself and his progress.  He plans to maintain this progress by working out at the gym consistently where he is a member.           Education: Education Goals: Education classes will be provided on a weekly basis, covering required topics. Participant will state understanding/return demonstration of topics presented.  Learning Barriers/Preferences:   Education Topics: Initial Evaluation Education: - Verbal, written and demonstration of respiratory meds, RPE/PD scales, oximetry and breathing techniques. Instruction on use of nebulizers and MDIs: cleaning and proper use, rinsing mouth with steroid doses and importance of monitoring MDI activations.   General Nutrition Guidelines/Fats and Fiber: -Group instruction provided by verbal, written material, models and posters to present the general guidelines for heart healthy nutrition. Gives an explanation and review of dietary fats and fiber.   Controlling Sodium/Reading Food Labels: -Group verbal and written material supporting the discussion of sodium use in heart healthy nutrition. Review and explanation with models, verbal and written materials for utilization of the food label.   Exercise Physiology &  Risk Factors: - Group verbal and written instruction with models to review the exercise physiology of the cardiovascular system and associated critical values. Details cardiovascular disease risk factors and the goals associated with each risk factor.   Aerobic Exercise & Resistance Training: - Gives group verbal and written discussion on the health impact of inactivity. On the components of aerobic and resistive training programs and the benefits of this training and how to safely progress through these programs.   Flexibility, Balance, General Exercise Guidelines: - Provides group verbal and written instruction on the benefits of flexibility and balance training programs. Provides general exercise guidelines with specific guidelines to those with heart or lung disease. Demonstration and skill practice provided.   Stress Management: - Provides group verbal and written instruction about the health risks of elevated stress, cause of high stress, and healthy ways to reduce stress.   Depression: - Provides group verbal and written instruction on the correlation between heart/lung disease and depressed mood, treatment options, and the stigmas associated with seeking treatment.   Exercise & Equipment Safety: - Individual verbal instruction and demonstration of equipment use and safety with use of the equipment.   Infection Prevention: - Provides verbal and written material to individual with discussion of infection control including proper hand washing and proper equipment cleaning during exercise session.   Falls Prevention: - Provides verbal and written material to individual with discussion of falls prevention and safety.   Diabetes: - Individual verbal and written instruction to review signs/symptoms of diabetes, desired ranges of glucose level fasting, after meals and with exercise. Advice that pre and post exercise glucose checks will be done for 3 sessions at entry of  program.   Chronic Lung Diseases: - Group verbal and written instruction to review new updates, new respiratory medications, new advancements in procedures and treatments. Provide  informative websites and "800" numbers of self-education.   Lung Procedures: - Group verbal and written instruction to describe testing methods done to diagnose lung disease. Review the outcome of test results. Describe the treatment choices: Pulmonary Function Tests, ABGs and oximetry.   Energy Conservation: - Provide group verbal and written instruction for methods to conserve energy, plan and organize activities. Instruct on pacing techniques, use of adaptive equipment and posture/positioning to relieve shortness of breath.   Triggers: - Group verbal and written instruction to review types of environmental controls: home humidity, furnaces, filters, dust mite/pet prevention, HEPA vacuums. To discuss weather changes, air quality and the benefits of nasal washing.   Exacerbations: - Group verbal and written instruction to provide: warning signs, infection symptoms, calling MD promptly, preventive modes, and value of vaccinations. Review: effective airway clearance, coughing and/or vibration techniques. Create an Sport and exercise psychologist.   Oxygen: - Individual and group verbal and written instruction on oxygen therapy. Includes supplement oxygen, available portable oxygen systems, continuous and intermittent flow rates, oxygen safety, concentrators, and Medicare reimbursement for oxygen.   Respiratory Medications: - Group verbal and written instruction to review medications for lung disease. Drug class, frequency, complications, importance of spacers, rinsing mouth after steroid MDI's, and proper cleaning methods for nebulizers.   AED/CPR: - Group verbal and written instruction with the use of models to demonstrate the basic use of the AED with the basic ABC's of resuscitation.   Breathing Retraining: - Provides  individuals verbal and written instruction on purpose, frequency, and proper technique of diaphragmatic breathing and pursed-lipped breathing. Applies individual practice skills.   Anatomy and Physiology of the Lungs: - Group verbal and written instruction with the use of models to provide basic lung anatomy and physiology related to function, structure and complications of lung disease.   Heart Failure: - Group verbal and written instruction on the basics of heart failure: signs/symptoms, treatments, explanation of ejection fraction, enlarged heart and cardiomyopathy.   Sleep Apnea: - Individual verbal and written instruction to review Obstructive Sleep Apnea. Review of risk factors, methods for diagnosing and types of masks and machines for OSA.   Anxiety: - Provides group, verbal and written instruction on the correlation between heart/lung disease and anxiety, treatment options, and management of anxiety.   Relaxation: - Provides group, verbal and written instruction about the benefits of relaxation for patients with heart/lung disease. Also provides patients with examples of relaxation techniques.   Knowledge Questionnaire Score:   Personal Goals and Risk Factors at Admission:     Personal Goals and Risk Factors at Admission - 02/25/15 0900    Personal Goals and Risk Factors on Admission    Weight Management Yes   Intervention Learn and follow the exercise and diet guidelines while in the program. Utilize the nutrition and education classes to help gain knowledge of the diet and exercise expectations in the program   Admit Weight 290 lb (131.543 kg)   Goal Weight 260 lb (117.935 kg)   Increase Aerobic Exercise and Physical Activity Yes   Intervention While in program, learn and follow the exercise prescription taught. Start at a low level workload and increase workload after able to maintain previous level for 30 minutes. Increase time before increasing intensity.    Understand more about Heart/Pulmonary Disease. Yes   Intervention While in program utilize professionals for any questions, and attend the education sessions. Great websites to use are www.americanheart.org or www.lung.org for reliable information.   Improve shortness of breath with ADL's Yes  Intervention While in program, learn and follow the exercise prescription taught. Start at a low level workload and increase workload ad advised by the exercise physiologist. Increase time before increasing intensity.   Develop more efficient breathing techniques such as purse lipped breathing and diaphragmatic breathing; and practicing self-pacing with activity Yes   Intervention While in program, learn and utilize the specific breathing techniques taught to you. Continue to practice and use the techniques as needed.   Increase knowledge of respiratory medications and ability to use respiratory devices properly.  Yes   Intervention While in program, learn to administer MDI, nebulizer, and spacer properly.;Learn to take respiratory medicine as ordered.;While in program, learn to Clean MDI, nebulizers, and spacers properly.   Diabetes Yes   Goal Blood glucose control identified by blood glucose values, HgbA1C. Participant verbalizes understanding of the signs/symptoms of hyper/hypo glycemia, proper foot care and importance of medication and nutrition plan for blood glucose control.   Intervention Provide nutrition & aerobic exercise along with prescribed medications to achieve blood glucose in normal ranges: Fasting 65-99 mg/dL   Hypertension Yes   Goal Participant will see blood pressure controlled within the values of 140/6290mm/Hg or within value directed by their physician.   Intervention Provide nutrition & aerobic exercise along with prescribed medications to achieve BP 140/90 or less.   Lipids Yes   Goal Cholesterol controlled with medications as prescribed, with individualized exercise RX and with  personalized nutrition plan. Value goals: LDL < 70mg , HDL > 40mg . Participant states understanding of desired cholesterol values and following prescriptions.   Intervention Provide nutrition & aerobic exercise along with prescribed medications to achieve LDL 70mg , HDL >40mg .      Personal Goals and Risk Factors Review:      Goals and Risk Factor Review      04/16/15 1640 04/28/15 1014 04/30/15 1000 05/09/15 1535 05/14/15 1207   Increase Aerobic Exercise and Physical Activity   Goals Progress/Improvement seen  Yes Yes   Yes   Comments Improved mid 6mwd by [id number] Started Saifan on interval training on the treadmill to continue to progress him aerobically. He said he still gets winded on any incline, therefore the intervals involve progressively taking the incline up and down.   Started doing the treadmill interval training twice in each class period. Is really enjoying the interval training and seeing the difference it makes in his breathing when he does strenuous tasks   Understand more about Heart/Pulmonary Disease   Goals Progress/Improvement seen    Yes     Comments   [name] has attended the educational classes and states the information has been very helpful in managing his lung disease.     Improve shortness of breath with ADL's   Goals Progress/Improvement seen  Yes   Yes    Comments Improved Shortness of Breath Questionnaire by 33 points   [name] states that it is easier to preform his daily routine.  He is also finding that he can walk longer distances with less SOB.    Increase knowledge of respiratory medications   Goals Progress/Improvement seen    Yes     Comments   [name] uses his Advair MDI and Ventolin MDI properly.       05/21/15 1000           Increase Aerobic Exercise and Physical Activity   Goals Progress/Improvement seen  Yes       Comments [name] improved his Post 6mwd by [id number].  Personal Goals Discharge:    Comments: Thank you for  the opportunity to work with your patient, Mr Geronimo Diliberto.

## 2015-05-28 NOTE — Progress Notes (Signed)
Daily Session Note  Patient Details  Name: [name] MRN: [id number] Date of Birth: 07-27-1956 Referring Provider:  Minna Merritts, MD  Encounter Date: 05/28/2015  Check In:     Session Check In - 05/28/15 1031    Check-In   Staff Present Lestine Box BS, ACSM EP-C, Exercise Physiologist;Renee Dillard Essex MS, ACSM CEP Exercise Physiologist;Laureen Janell Quiet, RRT, Respiratory Therapist   ER physicians immediately available to respond to emergencies [facility] immediately available ER MD   Physician(s) Joni Fears and Thomasene Lot   Medication changes reported     No   Fall or balance concerns reported    No   Warm-up and Cool-down Performed on first and last piece of equipment   VAD Patient? No   Pain Assessment   Currently in Pain? No/denies         Goals Met:  Proper associated with RPD/PD & O2 Sat Exercise tolerated well No report of cardiac concerns or symptoms Strength training completed today  Goals Unmet:  Not Applicable  Goals Comments:    Dr. Emily Filbert is Medical Director for [facility] and [facility].

## 2015-05-30 ENCOUNTER — Encounter: Payer: BLUE CROSS/BLUE SHIELD | Admitting: *Deleted

## 2015-05-30 ENCOUNTER — Ambulatory Visit: Payer: BLUE CROSS/BLUE SHIELD | Admitting: Respiratory Therapy

## 2015-05-30 DIAGNOSIS — G4730 Sleep apnea, unspecified: Secondary | ICD-10-CM

## 2015-05-30 DIAGNOSIS — G4733 Obstructive sleep apnea (adult) (pediatric): Secondary | ICD-10-CM

## 2015-05-30 NOTE — Progress Notes (Signed)
Daily Session Note  Patient Details  Name: STYLES FAMBRO MRN: [id number] Date of Birth: 1955/12/23 Referring Provider:  Juluis Pitch, MD  Encounter Date: 05/30/2015  Check In:     Session Check In - 05/30/15 1130    Check-In   Staff Present Frederich Cha RRT, RCP Respiratory Therapist;Renee Dillard Essex MS, ACSM CEP Exercise Physiologist;Diane Mariana Arn, BSN   ER physicians immediately available to respond to emergencies [facility] immediately available ER MD   Physician(s) Francoise Ceo   Medication changes reported     No   Fall or balance concerns reported    No   Warm-up and Cool-down Performed on first and last piece of equipment   VAD Patient? No   Pain Assessment   Currently in Pain? No/denies   Multiple Pain Sites No           Exercise Prescription Changes - 05/30/15 1100    NuStep   Level 6  T5 NS   Watts 80      Goals Met:  Proper associated with RPD/PD & O2 Sat Using PLB without cueing & demonstrates good technique Exercise tolerated well Personal goals reviewed Strength training completed today  Goals Unmet:  Not Applicable  Goals Comments:    Dr. Emily Filbert is Medical Director for [facility] and [facility].

## 2015-05-30 NOTE — Progress Notes (Signed)
Discharge Summary  Patient Details  Name: [name] MRN: [id number] Date of Birth: [birth year] Referring Provider:  No ref. provider found   Number of Visits: 36  Reason for Discharge:  Patient reached a stable level of exercise. Patient independent in their exercise.  Smoking History:  History  Smoking status  . Never Smoker   Smokeless tobacco  . Not on file    Diagnosis:  OSA; Morbid Obesity  ADL UCSD:     ADL [facility]      02/25/15 0900 04/16/15 1000 05/23/15 1000   ADL [facility]   ADL Phase Entry Mid Exit   SOB Score total 79 46 52   Rest 2 0 0   Walk 2 2 0   Stairs Bath 2 0 0   Dress 1 0 0   Shop Initial Exercise Prescription:   Discharge Exercise Prescription (Final Exercise Prescription Changes):     Exercise Prescription Changes - 05/30/15 1100    NuStep   Level 6  T5 NS   Watts 80      Functional Capacity:     6 Minute Walk      02/25/15 0900 04/16/15 1038 05/21/15 1047   6 Minute Walk   Phase Initial Mid Program Discharge   Distance 1475 feet 1600 feet 1650 feet   Walk Time 6 minutes 6 minutes 6 minutes   Resting HR 76 bpm 64 bpm 74 bpm   Resting BP 122/72 mmHg 122/72 mmHg 114/68 mmHg   Max Ex. HR 97 bpm 103 bpm 108 bpm   Max Ex. BP 144/70 mmHg 148/82 mmHg 162/72 mmHg   RPE Perceived Dyspnea  Symptoms  No       Psychological, QOL, Others - Outcomes: GDO4 Scales: Initial:22  Discharge:16  Quality of Life: SF36: Physical Fx  Initial: 15 Discharge: 15 Role Fx: Physical Initial:  4  Discharge:  4  Bodily Pain  Initial: 6.1Discharge: 6.1 General Health Initial: 10 Discharge:11 Vitality   Initial:  6  Discharge:14 Social Fx  Initial:  6  Discharge: 5 Role Fx: Emotional Initial:  4  Discharge: 3 Mental Health:  Initial: 21 Discharge:19   Personal Goals: Goals established at orientation with interventions provided to work toward goal.     Personal Goals and Risk Factors at Admission -  02/25/15 0900    Personal Goals and Risk Factors on Admission    Weight Management Yes   Intervention Learn and follow the exercise and diet guidelines while in the program. Utilize the nutrition and education classes to help gain knowledge of the diet and exercise expectations in the program   Admit Weight 290 lb (131.543 kg)   Goal Weight 260 lb (117.935 kg)   Increase Aerobic Exercise and Physical Activity Yes   Intervention While in program, learn and follow the exercise prescription taught. Start at a low level workload and increase workload after able to maintain previous level for 30 minutes. Increase time before increasing intensity.   Understand more about Heart/Pulmonary Disease. Yes   Intervention While in program utilize professionals for any questions, and attend the education sessions. Great websites to use are www.americanheart.org or www.lung.org for reliable information.   Improve shortness of breath with ADL's Yes   Intervention While in program, learn and follow the exercise prescription taught. Start at a low level workload and increase workload ad advised  by the exercise physiologist. Increase time before increasing intensity.   Develop more efficient breathing techniques such as purse lipped breathing and diaphragmatic breathing; and practicing self-pacing with activity Yes   Intervention While in program, learn and utilize the specific breathing techniques taught to you. Continue to practice and use the techniques as needed.   Increase knowledge of respiratory medications and ability to use respiratory devices properly.  Yes   Intervention While in program, learn to administer MDI, nebulizer, and spacer properly.;Learn to take respiratory medicine as ordered.;While in program, learn to Clean MDI, nebulizers, and spacers properly.   Diabetes Yes   Goal Blood glucose control identified by blood glucose values, HgbA1C. Participant verbalizes understanding of the signs/symptoms of  hyper/hypo glycemia, proper foot care and importance of medication and nutrition plan for blood glucose control.   Intervention Provide nutrition & aerobic exercise along with prescribed medications to achieve blood glucose in normal ranges: Fasting 65-99 mg/dL   Hypertension Yes   Goal Participant will see blood pressure controlled within the values of 140/2890mm/Hg or within value directed by their physician.   Intervention Provide nutrition & aerobic exercise along with prescribed medications to achieve BP 140/90 or less.   Lipids Yes   Goal Cholesterol controlled with medications as prescribed, with individualized exercise RX and with personalized nutrition plan. Value goals: LDL < 70mg , HDL > 40mg . Participant states understanding of desired cholesterol values and following prescriptions.   Intervention Provide nutrition & aerobic exercise along with prescribed medications to achieve LDL 70mg , HDL >40mg .       Personal Goals Discharge:     Personal Goals at Discharge - 05/28/15 0650    Increase Aerobic Exercise and Physical Activity   Goals Progress/Improvement seen  Yes   Comments [name] has increased his exercise goals on the TM, T5, and REL; increased his weights from 3lb to 5lb.  He also started interval training on the treadmill with sets of 1-5% grade. 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   Understand more about Heart/Pulmonary Disease   Goals Progress/Improvement seen  Yes   Comments Not only has [name] attended the educational classes, but he has offered information which I can use for future classses.   Improve shortness of breath with ADL's   Goals Progress/Improvement seen  Yes   Comments [name]'s UCSD Questionnaire has improved 27 points ; Minimal Importance Difference is 5 points.   Increase knowledge of respiratory medications   Goals Progress/Improvement seen  Yes   Comments Using his respiratory  inhalers properly.      Nutrition & Weight - Outcomes:      Post Biometrics - 05/21/15 1051     Post  Biometrics   Height 5\' 10"  (1.778 m)   Weight 283 lb 6.4 oz (128.549 kg)   BMI (Calculated) 40.7      Nutrition:   Nutrition Discharge:   Education Questionnaire Score:   Goals reviewed with patient; copy given to patient.

## 2015-06-02 ENCOUNTER — Encounter: Payer: BLUE CROSS/BLUE SHIELD | Admitting: *Deleted

## 2015-06-02 DIAGNOSIS — G4730 Sleep apnea, unspecified: Secondary | ICD-10-CM | POA: Diagnosis not present

## 2015-06-02 NOTE — Progress Notes (Signed)
Pulmonary Individual Treatment Plan  Patient Details  Name: [name] MRN: [id number] Date of Birth: Apr 20, 1956 Referring Provider:  Dr Julien Nordmann  Initial Encounter Date: 02/25/2015   Visit Diagnosis: Morbid obesity - Plan: PULMONARY REHAB 30 DAY REVIEW  Patient's Home Medications on Admission:  Current outpatient prescriptions:    acetaminophen (TYLENOL) 325 MG tablet, Take 650 mg by mouth every 6 (six) hours as needed., Disp: , Rfl:    albuterol (PROVENTIL HFA;VENTOLIN HFA) 108 (90 BASE) MCG/ACT inhaler, Inhale into the lungs every 6 (six) hours as needed for wheezing or shortness of breath., Disp: , Rfl:    ALPRAZolam (XANAX) 0.5 MG tablet, Take 1/2 tablet in the am with 1 tablet in the pm., Disp: , Rfl:    aspirin 81 MG tablet, Take 81 mg by mouth daily., Disp: , Rfl:    atorvastatin (LIPITOR) 40 MG tablet, Take 40 mg by mouth daily., Disp: , Rfl:    buPROPion (WELLBUTRIN SR) 150 MG 12 hr tablet, Take 150 mg by mouth 2 (two) times daily., Disp: , Rfl:    clopidogrel (PLAVIX) 75 MG tablet, Take 75 mg by mouth daily., Disp: , Rfl:    fluticasone-salmeterol (ADVAIR HFA) 115-21 MCG/ACT inhaler, Inhale 2 puffs into the lungs 2 (two) times daily., Disp: , Rfl:    furosemide (LASIX) 20 MG tablet, Take 20 mg by mouth., Disp: , Rfl:    gabapentin (NEURONTIN) 300 MG capsule, Take 300 mg by mouth 3 (three) times daily., Disp: , Rfl:    ibuprofen (ADVIL,MOTRIN) 200 MG tablet, Take 200 mg by mouth every 6 (six) hours as needed., Disp: , Rfl:    metFORMIN (GLUCOPHAGE) 850 MG tablet, Take 850 mg by mouth daily with breakfast. , Disp: , Rfl:    metoprolol tartrate (LOPRESSOR) 25 MG tablet, Take 12.5 mg by mouth 2 (two) times daily., Disp: , Rfl:    nitroGLYCERIN (NITROSTAT) 0.4 MG SL tablet, Place 0.4 mg under the tongue every 5 (five) minutes as needed for chest pain., Disp: , Rfl:    potassium chloride SA (K-DUR,KLOR-CON) 20 MEQ tablet, Take 20 mEq by mouth daily. , Disp: ,  Rfl:    RABEprazole (ACIPHEX) 20 MG tablet, Take 20 mg by mouth 2 (two) times daily. , Disp: , Rfl:    ranitidine (ZANTAC) 150 MG capsule, Take 150 mg by mouth 2 (two) times daily., Disp: , Rfl:    ranolazine (RANEXA) 1000 MG SR tablet, Take 1 tablet (1,000 mg total) by mouth 2 (two) times daily., Disp: 60 tablet, Rfl: 6   senna (SENOKOT) 8.6 MG tablet, Take 1 tablet by mouth daily., Disp: , Rfl:    venlafaxine (EFFEXOR) 75 MG tablet, Take 75 mg by mouth 2 (two) times daily., Disp: , Rfl:   Past Medical History: Past Medical History  Diagnosis Date   Type 2 diabetes mellitus    Coronary artery disease    GERD (gastroesophageal reflux disease)    Hyperlipidemia    Hypertension    Atherosclerosis of coronary artery bypass graft with angina pectoris    Sleep apnea    Barrett's esophagus    Anxiety    H/O esophageal spasm    Chronic constipation    Colon polyp    MI (myocardial infarction)     Tobacco Use: History  Smoking status   Never Smoker   Smokeless tobacco   Not on file    Labs: Recent Review Flowsheet Data    Labs for ITP Cardiac and Pulmonary Rehab  Latest Ref Rng 04/22/2009 11/18/2009 11/19/2009   Cholestrol 0 - 200 mg/dL - - 161 ATP III CLASSIFICATION: <200     mg/dL   Desirable 096-045  mg/dL   Borderline High >=409    mg/dL   High   LDLCALC 0 - 99 mg/dL - - 70 Total Cholesterol/HDL:CHD Risk Coronary Heart Disease Risk Table Men   Women 1/2 Average Risk   3.4   3.3 Average Risk       5.0   4.4 2 X Average Risk   9.6   7.1 3 X Average Risk  23.4   11.0 Use the calculated Patient Ratio above and the CHD Risk Table to determine the patient's CHD Risk. ATP III CLASSIFICATION (LDL): <100     mg/dL   Optimal 811-914  mg/dL   Near or Above Optimal 130-159  mg/dL   Borderline 782-956  mg/dL   High >213     mg/dL   Very High   HDL >08 mg/dL - - 65(H)   Trlycerides <150 mg/dL - - 75   Hemoglobin Q4O 4.6 - 6.1 % - 5.7 (NOTE) The ADA  recommends the following therapeutic goal for glycemic control related to Hgb A1c measurement: Goal of therapy: <6.5 Hgb A1c  Reference: American Diabetes Association: Clinical Practice Recommendations 2010, Diabetes Care, 2010, 33: (Suppl 1). -   TCO2 0 - 100 mmol/L 21 - -         POCT Glucose      04/16/15 1629           POCT Blood Glucose   Pre-Exercise 133 mg/dL       Post-Exercise 962 mg/dL       Pre-Exercise #2 952 mg/dL       Post-Exercise #2 128 mg/dL       Pre-Exercise #3 841 mg/dL       Post-Exercise #3 152 mg/dL          ADL UCSD:     ADL UCSD      02/25/15 0900 04/16/15 1000 05/23/15 1000   ADL UCSD   ADL Phase Entry Mid Exit   SOB Score total 79 46 52   Rest 2 0 0   Walk 2 2 0   Stairs 5 4 5    Bath 2 0 0   Dress 1 0 0   Shop 2 3 3        Pulmonary Function Assessment:   Exercise Target Goals:    Exercise Program Goal: Individual exercise prescription set with THRR, safety & activity barriers. Participant demonstrates ability to understand and report RPE using BORG scale, to self-measure pulse accurately, and to acknowledge the importance of the exercise prescription.  Exercise Prescription Goal: Starting with aerobic activity 30 plus minutes a day, 3 days per week for initial exercise prescription. Provide home exercise prescription and guidelines that participant acknowledges understanding prior to discharge.  Activity Barriers & Risk Stratification:   6 Minute Walk:     6 Minute Walk      02/25/15 0900 04/16/15 1038 05/21/15 1047   6 Minute Walk   Phase Initial Mid Program Discharge   Distance 1475 feet 1600 feet 1650 feet   Walk Time 6 minutes 6 minutes 6 minutes   Resting HR 76 bpm 64 bpm 74 bpm   Resting BP 122/72 mmHg 122/72 mmHg 114/68 mmHg   Max Ex. HR 97 bpm 103 bpm 108 bpm   Max Ex. BP 144/70 mmHg 148/82 mmHg 162/72 mmHg  RPE Perceived Dyspnea  Symptoms  No       Initial Exercise  Prescription:   Exercise Prescription Changes:     Exercise Prescription Changes      03/24/15 1400 03/26/15 1100 03/28/15 1000 03/31/15 1100 04/14/15 1100   Exercise Review   Progression    Yes Yes   Response to Exercise   Blood Pressure (Admit)     110/68 mmHg   Blood Pressure (Exercise)     132/74 mmHg   Blood Pressure (Exit)     120/82 mmHg   Heart Rate (Admit)     76 bpm   Heart Rate (Exercise)     94 bpm   Heart Rate (Exit)     83 bpm   Oxygen Saturation (Admit)     100 %   Oxygen Saturation (Exercise)     97 %   Oxygen Saturation (Exit)     100 %   Rating of Perceived Exertion (Exercise)     15   Perceived Dyspnea (Exercise)     4   Resistance Training   Training Prescription     Yes   Weight     5   Reps     10-12   Treadmill   MPH    2.6 2.6   Grade Minutes     15   NuStep   Level    5 5   Watts    60 60   Minutes     10   Recumbant Elliptical   Level  4.5   4.5   Watts  55 60  60   Minutes     15     04/23/15 1100 04/28/15 1000 05/05/15 1100 05/07/15 1500 05/12/15 1100   Exercise Review   Progression Yes Yes Yes Yes Yes   Response to Exercise   Blood Pressure (Admit)    110/70 mmHg 110/70 mmHg   Blood Pressure (Exercise)    138/76 mmHg 138/76 mmHg   Blood Pressure (Exit)    132/68 mmHg 132/68 mmHg   Heart Rate (Admit)    68 bpm 68 bpm   Heart Rate (Exercise)    102 bpm 102 bpm   Heart Rate (Exit)    79 bpm 79 bpm   Oxygen Saturation (Admit)    100 % 100 %   Oxygen Saturation (Exercise)    99 % 99 %   Oxygen Saturation (Exit)    98 % 98 %   Rating of Perceived Exertion (Exercise)    12 12   Perceived Dyspnea (Exercise)    3 3   Treadmill   MPH 2.7 2.7 2.7     Grade 1 --  Interval Training: Elevation ranges for 1-5% --  Interval Training: Elevation ranges for 1-5%     Minutes 15       NuStep   Level   6  T5 NS     Watts   70     Recumbant Elliptical   Level  5.5 5.5     Watts  65 65  70   Minutes  15 15       05/19/15 1000 05/21/15  1000 05/30/15 1100 06/02/15 1000     Exercise Review   Progression Yes Yes  Yes    Resistance Training   Training Prescription    Yes    Weight  5    Reps    10-12    Treadmill   MPH 2.7 2.7  2.7    Grade --  Interval Training: Elevation ranges for 1-5% --  Interval Training: Elevation ranges for 1-5%  5    Minutes    15    NuStep   Level 6  T5 NS 6  T5 NS 6  T5 NS 5    Watts 70 70 80 80    Minutes    15    Recumbant Elliptical   Level 5 5  5     Watts 80 80  80    Minutes 15 15  15        Discharge Exercise Prescription (Final Exercise Prescription Changes):     Exercise Prescription Changes - 06/02/15 1000    Exercise Review   Progression Yes   Resistance Training   Training Prescription Yes   Weight 5   Reps 10-12   Treadmill   MPH 2.7   Grade 5   Minutes 15   NuStep   Level 5   Watts 80   Minutes 15   Recumbant Elliptical   Level 5   Watts 80   Minutes 15       Nutrition:  Target Goals: Understanding of nutrition guidelines, daily intake of sodium 1500mg , cholesterol 200mg , calories 30% from fat and 7% or less from saturated fats, daily to have 5 or more servings of fruits and vegetables.  Biometrics:      Post Biometrics - 05/21/15 1051     Post  Biometrics   Height 5\' 10"  (1.778 m)   Weight 283 lb 6.4 oz (128.549 kg)   BMI (Calculated) 40.7      Nutrition Therapy Plan and Nutrition Goals:   Nutrition Discharge: Rate Your Plate Scores:   Psychosocial: Target Goals: Acknowledge presence or absence of depression, maximize coping skills, provide positive support system. Participant is able to verbalize types and ability to use techniques and skills needed for reducing stress and depression.  Initial Review & Psychosocial Screening:   Quality of Life Scores:   PHQ-9:     Recent Review Flowsheet Data    There is no flowsheet data to display.      Psychosocial Evaluation and Intervention:   Psychosocial Re-Evaluation:      Psychosocial Re-Evaluation      05/21/15 1048           Psychosocial Re-Evaluation   Comments Counselor follow up with Mr. Frankowski today prior to his upcoming discharge from this program.  He reports progres experienced of walking longer distance without shortness of breath & breathing better in general.  He continues to sleep well and maintains a healthy appetite.  Mr. Imran reports he has benefitted from the educational components of this program as well; particularly setting realistic goals and being more patient with himself and his progress.  He plans to maintain this progress by working out at the gym consistently where he is a member.           Education: Education Goals: Education classes will be provided on a weekly basis, covering required topics. Participant will state understanding/return demonstration of topics presented.  Learning Barriers/Preferences:   Education Topics: Initial Evaluation Education: - Verbal, written and demonstration of respiratory meds, RPE/PD scales, oximetry and breathing techniques. Instruction on use of nebulizers and MDIs: cleaning and proper use, rinsing mouth with steroid doses and importance of monitoring MDI activations.   General Nutrition Guidelines/Fats and  Fiber: -Group instruction provided by verbal, written material, models and posters to present the general guidelines for heart healthy nutrition. Gives an explanation and review of dietary fats and fiber.          Most Recent Value   Date  05/19/15   Educator  CR   Instruction Review Code  2- meets goals/outcomes      Controlling Sodium/Reading Food Labels: -Group verbal and written material supporting the discussion of sodium use in heart healthy nutrition. Review and explanation with models, verbal and written materials for utilization of the food label.   Exercise Physiology & Risk Factors: - Group verbal and written instruction with models to review the exercise  physiology of the cardiovascular system and associated critical values. Details cardiovascular disease risk factors and the goals associated with each risk factor.   Aerobic Exercise & Resistance Training: - Gives group verbal and written discussion on the health impact of inactivity. On the components of aerobic and resistive training programs and the benefits of this training and how to safely progress through these programs.   Flexibility, Balance, General Exercise Guidelines: - Provides group verbal and written instruction on the benefits of flexibility and balance training programs. Provides general exercise guidelines with specific guidelines to those with heart or lung disease. Demonstration and skill practice provided.      Most Recent Value   Date  05/12/15   Educator  L. Brown,RT   Instruction Review Code  2- meets goals/outcomes      Stress Management: - Provides group verbal and written instruction about the health risks of elevated stress, cause of high stress, and healthy ways to reduce stress.      Most Recent Value   Date  04/23/15   Educator  [facility]   Instruction Review Code  2- meets goals/outcomes      Depression: - Provides group verbal and written instruction on the correlation between heart/lung disease and depressed mood, treatment options, and the stigmas associated with seeking treatment.      Most Recent Value   Date  05/07/15   Educator  [facility]   Instruction Review Code  2- meets goals/outcomes      Exercise & Equipment Safety: - Individual verbal instruction and demonstration of equipment use and safety with use of the equipment.   Infection Prevention: - Provides verbal and written material to individual with discussion of infection control including proper hand washing and proper equipment cleaning during exercise session.   Falls Prevention: - Provides verbal and written material to individual with discussion of falls prevention and  safety.   Diabetes: - Individual verbal and written instruction to review signs/symptoms of diabetes, desired ranges of glucose level fasting, after meals and with exercise. Advice that pre and post exercise glucose checks will be done for 3 sessions at entry of program.      Most Recent Value   Date  05/02/15   Educator  CE   Instruction Review Code  2- meets goals/outcomes      Chronic Lung Diseases: - Group verbal and written instruction to review new updates, new respiratory medications, new advancements in procedures and treatments. Provide informative websites and "800" numbers of self-education.      Most Recent Value   Date  03/31/15   Educator  LB   Instruction Review Code  2- meets goals/outcomes      Lung Procedures: - Group verbal and written instruction to describe testing methods done to diagnose lung disease. Review the  outcome of test results. Describe the treatment choices: Pulmonary Function Tests, ABGs and oximetry.   Energy Conservation: - Provide group verbal and written instruction for methods to conserve energy, plan and organize activities. Instruct on pacing techniques, use of adaptive equipment and posture/positioning to relieve shortness of breath.      Most Recent Value   Date  04/25/15   Educator  [facility]   Instruction Review Code  2- meets goals/outcomes      Triggers: - Group verbal and written instruction to review types of environmental controls: home humidity, furnaces, filters, dust mite/pet prevention, HEPA vacuums. To discuss weather changes, air quality and the benefits of nasal washing.      Most Recent Value   Date  06/02/15   Educator  LB   Instruction Review Code  2- meets goals/outcomes      Exacerbations: - Group verbal and written instruction to provide: warning signs, infection symptoms, calling MD promptly, preventive modes, and value of vaccinations. Review: effective airway clearance, coughing and/or vibration techniques.  Create an Sport and exercise psychologist.      Most Recent Value   Date  04/28/15   Educator  L. Manson Passey   Instruction Review Code  2- meets goals/outcomes      Oxygen: - Individual and group verbal and written instruction on oxygen therapy. Includes supplement oxygen, available portable oxygen systems, continuous and intermittent flow rates, oxygen safety, concentrators, and Medicare reimbursement for oxygen.   Respiratory Medications: - Group verbal and written instruction to review medications for lung disease. Drug class, frequency, complications, importance of spacers, rinsing mouth after steroid MDI's, and proper cleaning methods for nebulizers.   AED/CPR: - Group verbal and written instruction with the use of models to demonstrate the basic use of the AED with the basic ABC's of resuscitation.      Most Recent Value   Date  04/18/15   Educator  [facility]   Instruction Review Code  2- meets goals/outcomes      Breathing Retraining: - Provides individuals verbal and written instruction on purpose, frequency, and proper technique of diaphragmatic breathing and pursed-lipped breathing. Applies individual practice skills.   Anatomy and Physiology of the Lungs: - Group verbal and written instruction with the use of models to provide basic lung anatomy and physiology related to function, structure and complications of lung disease.      Most Recent Value   Date  05/16/15   Educator  SJ   Instruction Review Code  2- meets goals/outcomes      Heart Failure: - Group verbal and written instruction on the basics of heart failure: signs/symptoms, treatments, explanation of ejection fraction, enlarged heart and cardiomyopathy.   Sleep Apnea: - Individual verbal and written instruction to review Obstructive Sleep Apnea. Review of risk factors, methods for diagnosing and types of masks and machines for OSA.   Anxiety: - Provides group, verbal and written instruction on the correlation between  heart/lung disease and anxiety, treatment options, and management of anxiety.   Relaxation: - Provides group, verbal and written instruction about the benefits of relaxation for patients with heart/lung disease. Also provides patients with examples of relaxation techniques.   Knowledge Questionnaire Score:   Personal Goals and Risk Factors at Admission:     Personal Goals and Risk Factors at Admission - 02/25/15 0900    Personal Goals and Risk Factors on Admission    Weight Management Yes   Intervention Learn and follow the exercise and diet guidelines while in the program. Utilize  the nutrition and education classes to help gain knowledge of the diet and exercise expectations in the program   Admit Weight 290 lb (131.543 kg)   Goal Weight 260 lb (117.935 kg)   Increase Aerobic Exercise and Physical Activity Yes   Intervention While in program, learn and follow the exercise prescription taught. Start at a low level workload and increase workload after able to maintain previous level for 30 minutes. Increase time before increasing intensity.   Understand more about Heart/Pulmonary Disease. Yes   Intervention While in program utilize professionals for any questions, and attend the education sessions. Great websites to use are www.americanheart.org or www.lung.org for reliable information.   Improve shortness of breath with ADL's Yes   Intervention While in program, learn and follow the exercise prescription taught. Start at a low level workload and increase workload ad advised by the exercise physiologist. Increase time before increasing intensity.   Develop more efficient breathing techniques such as purse lipped breathing and diaphragmatic breathing; and practicing self-pacing with activity Yes   Intervention While in program, learn and utilize the specific breathing techniques taught to you. Continue to practice and use the techniques as needed.   Increase knowledge of respiratory  medications and ability to use respiratory devices properly.  Yes   Intervention While in program, learn to administer MDI, nebulizer, and spacer properly.;Learn to take respiratory medicine as ordered.;While in program, learn to Clean MDI, nebulizers, and spacers properly.   Diabetes Yes   Goal Blood glucose control identified by blood glucose values, HgbA1C. Participant verbalizes understanding of the signs/symptoms of hyper/hypo glycemia, proper foot care and importance of medication and nutrition plan for blood glucose control.   Intervention Provide nutrition & aerobic exercise along with prescribed medications to achieve blood glucose in normal ranges: Fasting 65-99 mg/dL   Hypertension Yes   Goal Participant will see blood pressure controlled within the values of 140/14mm/Hg or within value directed by their physician.   Intervention Provide nutrition & aerobic exercise along with prescribed medications to achieve BP 140/90 or less.   Lipids Yes   Goal Cholesterol controlled with medications as prescribed, with individualized exercise RX and with personalized nutrition plan. Value goals: LDL < 70mg , HDL > 40mg . Participant states understanding of desired cholesterol values and following prescriptions.   Intervention Provide nutrition & aerobic exercise along with prescribed medications to achieve LDL 70mg , HDL >40mg .      Personal Goals and Risk Factors Review:      Goals and Risk Factor Review      04/16/15 1640 04/28/15 1014 04/30/15 1000 05/09/15 1535 05/14/15 1207   Increase Aerobic Exercise and Physical Activity   Goals Progress/Improvement seen  Yes Yes   Yes   Comments Improved mid by 120ft Started Amanda on interval training on the treadmill to continue to progress him aerobically. He said he still gets winded on any incline, therefore the intervals involve progressively taking the incline up and down.   Started doing the treadmill interval training twice in each class  period. Is really enjoying the interval training and seeing the difference it makes in his breathing when he does strenuous tasks   Understand more about Heart/Pulmonary Disease   Goals Progress/Improvement seen    Yes     Comments   Mr Trombetta has attended the educational classes and states the information has been very helpful in managing his lung disease.     Improve shortness of breath with ADL's   Goals Progress/Improvement seen  Yes   Yes    Comments Improved Shortness of Breath Questionnaire by 33 points   [name] states that it is easier to preform his daily routine.  He is also finding that he can walk longer distances with less SOB.    Increase knowledge of respiratory medications   Goals Progress/Improvement seen    Yes     Comments   [name] uses his Advair MDI and Ventolin MDI properly.       05/21/15 1000           Increase Aerobic Exercise and Physical Activity   Goals Progress/Improvement seen  Yes       Comments [name] improved his Post 6mwd by 11275ft.          Personal Goals Discharge:      Personal Goals at Discharge - 05/28/15 0650    Increase Aerobic Exercise and Physical Activity   Goals Progress/Improvement seen  Yes   Comments [name] has increased his exercise goals on the TM, T5, and REL; increased his weights from 3lb to 5lb.  He also started interval training on the treadmill with sets of 1-5% grade. 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   Understand more about Heart/Pulmonary Disease   Goals Progress/Improvement seen  Yes   Comments Not only has [name] attended the educational classes, but he has offered information which I can use for future classses.   Improve shortness of breath with ADL's   Goals Progress/Improvement seen  Yes   Comments Mr Spira's UCSD Questionnaire has improved 27 points ; Minimal Importance Difference is 5 points.   Increase knowledge of  respiratory medications   Goals Progress/Improvement seen  Yes   Comments Using his respiratory inhalers properly.      Comments: Thank you for the opportunity to work with your patient, Mr Silverio Decampddie Heinecke

## 2015-06-02 NOTE — Progress Notes (Signed)
Discharge Summary  Patient Details  Name: [name] MRN: [id number] Date of Birth: 02/08/1956 Referring Provider:  Dorothey BasemanBronstein, David, MD   Number of Visits: [id number]  Reason for Discharge:  Patient reached a stable level of exercise. Patient independent in their exercise.  Smoking History:  History  Smoking status  . Never Smoker   Smokeless tobacco  . Not on file    Diagnosis:  Morbid obesity  ADL [facility]:     ADL [facility]      02/25/15 0900 04/16/15 1000 05/23/15 1000   ADL [facility]   ADL Phase Entry Mid Exit   SOB Score total 79 46 52   Rest 2 0 0   Walk 2 2 0   Stairs 5 4 5    Bath 2 0 0   Dress 1 0 0   Shop 2 3 3       Initial Exercise Prescription:   Discharge Exercise Prescription (Final Exercise Prescription Changes):     Exercise Prescription Changes - 06/02/15 1000    Exercise Review   Progression Yes   Resistance Training   Training Prescription Yes   Weight 5   Reps 10-12   Treadmill   MPH 2.7   Grade 5   Minutes 15   NuStep   Level 5   Watts 80   Minutes 15   Recumbant Elliptical   Level 5   Watts 80   Minutes 15      Functional Capacity:     6 Minute Walk      02/25/15 0900 04/16/15 1038 05/21/15 1047   6 Minute Walk   Phase Initial Mid Program Discharge   Distance 1475 feet 1600 feet 1650 feet   Walk Time 6 minutes 6 minutes 6 minutes   Resting HR 76 bpm 64 bpm 74 bpm   Resting BP 122/72 mmHg 122/72 mmHg 114/68 mmHg   Max Ex. HR 97 bpm 103 bpm 108 bpm   Max Ex. BP 144/70 mmHg 148/82 mmHg 162/72 mmHg   RPE 15 18 17    Perceived Dyspnea  4 6 5    Symptoms  No       Psychological, QOL, Others - Outcomes: PHQ 2/9: No flowsheet data found.  Quality of Life:   Personal Goals: Goals established at orientation with interventions provided to work toward goal.     Personal Goals and Risk Factors at Admission - 02/25/15 0900    Personal Goals and Risk Factors on Admission    Weight Management Yes   Intervention Learn and follow  the exercise and diet guidelines while in the program. Utilize the nutrition and education classes to help gain knowledge of the diet and exercise expectations in the program   Admit Weight 290 lb (131.543 kg)   Goal Weight 260 lb (117.935 kg)   Increase Aerobic Exercise and Physical Activity Yes   Intervention While in program, learn and follow the exercise prescription taught. Start at a low level workload and increase workload after able to maintain previous level for 30 minutes. Increase time before increasing intensity.   Understand more about Heart/Pulmonary Disease. Yes   Intervention While in program utilize professionals for any questions, and attend the education sessions. Great websites to use are www.americanheart.org or www.lung.org for reliable information.   Improve shortness of breath with ADL's Yes   Intervention While in program, learn and follow the exercise prescription taught. Start at a low level workload and increase workload ad advised by the exercise physiologist. Increase time  before increasing intensity.   Develop more efficient breathing techniques such as purse lipped breathing and diaphragmatic breathing; and practicing self-pacing with activity Yes   Intervention While in program, learn and utilize the specific breathing techniques taught to you. Continue to practice and use the techniques as needed.   Increase knowledge of respiratory medications and ability to use respiratory devices properly.  Yes   Intervention While in program, learn to administer MDI, nebulizer, and spacer properly.;Learn to take respiratory medicine as ordered.;While in program, learn to Clean MDI, nebulizers, and spacers properly.   Diabetes Yes   Goal Blood glucose control identified by blood glucose values, HgbA1C. Participant verbalizes understanding of the signs/symptoms of hyper/hypo glycemia, proper foot care and importance of medication and nutrition plan for blood glucose control.    Intervention Provide nutrition & aerobic exercise along with prescribed medications to achieve blood glucose in normal ranges: Fasting 65-99 mg/dL   Hypertension Yes   Goal Participant will see blood pressure controlled within the values of 140/26mm/Hg or within value directed by their physician.   Intervention Provide nutrition & aerobic exercise along with prescribed medications to achieve BP 140/90 or less.   Lipids Yes   Goal Cholesterol controlled with medications as prescribed, with individualized exercise RX and with personalized nutrition plan. Value goals: LDL < , HDL > . Participant states understanding of desired cholesterol values and following prescriptions.   Intervention Provide nutrition & aerobic exercise along with prescribed medications to achieve LDL 70mg , HDL >40mg .       Personal Goals Discharge:     Personal Goals at Discharge - 05/28/15 0650    Increase Aerobic Exercise and Physical Activity   Goals Progress/Improvement seen  Yes   Comments Mr Staggs has increased his exercise goals on the TM, T5, and REL; increased his weights from 3lb to 5lb.  He also started interval training on the treadmill with sets of 1-5% grade. 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   Understand more about Heart/Pulmonary Disease   Goals Progress/Improvement seen  Yes   Comments Not only has Mr Zayed attended the educational classes, but he has offered information which I can use for future classses.   Improve shortness of breath with ADL's   Goals Progress/Improvement seen  Yes   Comments Mr Banales's UCSD Questionnaire has improved 27 points ; Minimal Importance Difference is 5 points.   Increase knowledge of respiratory medications   Goals Progress/Improvement seen  Yes   Comments Using his respiratory inhalers properly.      Nutrition & Weight - Outcomes:      Post Biometrics - 05/21/15 1051     Post   Biometrics   Height  (1.778 m)   Weight 283 lb 6.4 oz (128.549 kg)   BMI (Calculated) 40.7      Nutrition:   Nutrition Discharge:   Education Questionnaire Score:   Goals reviewed with patient; copy given to patient.

## 2015-06-02 NOTE — Progress Notes (Signed)
Pulmonary Individual Treatment Plan  Patient Details  Name: [name] MRN: [id number] Date of Birth: [birth year] Referring Provider:  Dorothey Baseman, MD  Initial Encounter Date:    Visit Diagnosis: Morbid obesity  Patient's Home Medications on Admission:  Current outpatient prescriptions:  .  acetaminophen (TYLENOL) 325 MG tablet, Take 650 mg by mouth every 6 (six) hours as needed., Disp: , Rfl:  .  albuterol (PROVENTIL HFA;VENTOLIN HFA) 108 (90 BASE) MCG/ACT inhaler, Inhale into the lungs every 6 (six) hours as needed for wheezing or shortness of breath., Disp: , Rfl:  .  ALPRAZolam (XANAX) 0.5 MG tablet, Take 1/2 tablet in the am with 1 tablet in the pm., Disp: , Rfl:  .  aspirin 81 MG tablet, Take 81 mg by mouth daily., Disp: , Rfl:  .  atorvastatin (LIPITOR) 40 MG tablet, Take 40 mg by mouth daily., Disp: , Rfl:  .  buPROPion (WELLBUTRIN SR) 150 MG 12 hr tablet, Take 150 mg by mouth 2 (two) times daily., Disp: , Rfl:  .  clopidogrel (PLAVIX) 75 MG tablet, Take 75 mg by mouth daily., Disp: , Rfl:  .  fluticasone-salmeterol (ADVAIR HFA) 115-21 MCG/ACT inhaler, Inhale 2 puffs into the lungs 2 (two) times daily., Disp: , Rfl:  .  furosemide (LASIX) 20 MG tablet, Take 20 mg by mouth., Disp: , Rfl:  .  gabapentin (NEURONTIN) 300 MG capsule, Take 300 mg by mouth 3 (three) times daily., Disp: , Rfl:  .  ibuprofen (ADVIL,MOTRIN) 200 MG tablet, Take 200 mg by mouth every 6 (six) hours as needed., Disp: , Rfl:  .  metFORMIN (GLUCOPHAGE) 850 MG tablet, Take 850 mg by mouth daily with breakfast. , Disp: , Rfl:  .  metoprolol tartrate (LOPRESSOR) 25 MG tablet, Take 12.5 mg by mouth 2 (two) times daily., Disp: , Rfl:  .  nitroGLYCERIN (NITROSTAT) 0.4 MG SL tablet, Place 0.4 mg under the tongue every 5 (five) minutes as needed for chest pain., Disp: , Rfl:  .  potassium chloride SA (K-DUR,KLOR-CON) 20 MEQ tablet, Take 20 mEq by mouth daily. , Disp: , Rfl:  .  RABEprazole (ACIPHEX) 20 MG  tablet, Take 20 mg by mouth 2 (two) times daily. , Disp: , Rfl:  .  ranitidine (ZANTAC) 150 MG capsule, Take 150 mg by mouth 2 (two) times daily., Disp: , Rfl:  .  ranolazine (RANEXA) 1000 MG SR tablet, Take 1 tablet (1,000 mg total) by mouth 2 (two) times daily., Disp: 60 tablet, Rfl: 6 .  senna (SENOKOT) 8.6 MG tablet, Take 1 tablet by mouth daily., Disp: , Rfl:  .  venlafaxine (EFFEXOR) 75 MG tablet, Take 75 mg by mouth 2 (two) times daily., Disp: , Rfl:   Past Medical History: Past Medical History  Diagnosis Date  . Type 2 diabetes mellitus   . Coronary artery disease   . GERD (gastroesophageal reflux disease)   . Hyperlipidemia   . Hypertension   . Atherosclerosis of coronary artery bypass graft with angina pectoris   . Sleep apnea   . Barrett's esophagus   . Anxiety   . H/O esophageal spasm   . Chronic constipation   . Colon polyp   . MI (myocardial infarction)     Tobacco Use: History  Smoking status  . Never Smoker   Smokeless tobacco  . Not on file    Labs: Recent Review Flowsheet Data    Labs for ITP Cardiac and Pulmonary Rehab Latest Ref Rng 04/22/2009 11/18/2009 11/19/2009  Cholestrol 0 - 200 mg/dL - - 409 ATP III CLASSIFICATION: <200     mg/dL   Desirable 811-914  mg/dL   Borderline High >=782    mg/dL   High   LDLCALC 0 - 99 mg/dL - - 70 Total Cholesterol/HDL:CHD Risk Coronary Heart Disease Risk Table Men   Women 1/2 Average Risk   3.4   3.3 Average Risk       5.0   4.4 2 X Average Risk   9.6   7.1 3 X Average Risk  23.4   11.0 Use the calculated Patient Ratio above and the CHD Risk Table to determine the patient's CHD Risk. ATP III CLASSIFICATION (LDL): <100     mg/dL   Optimal 956-213  mg/dL   Near or Above Optimal 130-159  mg/dL   Borderline 086-578  mg/dL   High >469     mg/dL   Very High   HDL >62 mg/dL - - 95(M)   Trlycerides <150 mg/dL - - 75   Hemoglobin W4X 4.6 - 6.1 % - 5.7 (NOTE) The ADA recommends the following therapeutic goal for  glycemic control related to Hgb A1c measurement: Goal of therapy: <6.5 Hgb A1c  Reference: American Diabetes Association: Clinical Practice Recommendations 2010, Diabetes Care, 2010, 33: (Suppl 1). -   TCO2 0 - 100 mmol/L 21 - -         POCT Glucose      04/16/15 1629           POCT Blood Glucose   Pre-Exercise 133 mg/dL       Post-Exercise 324 mg/dL       Pre-Exercise #2 401 mg/dL       Post-Exercise #2 128 mg/dL       Pre-Exercise #3 027 mg/dL       Post-Exercise #3 152 mg/dL          ADL UCSD:     ADL UCSD      02/25/15 0900 04/16/15 1000 05/23/15 1000   ADL UCSD   ADL Phase Entry Mid Exit   SOB Score total 79 46 52   Rest 2 0 0   Walk 2 2 0   Stairs 5 4 5    Bath 2 0 0   Dress 1 0 0   Shop 2 3 3        Pulmonary Function Assessment:   Exercise Target Goals:    Exercise Program Goal: Individual exercise prescription set with THRR, safety & activity barriers. Participant demonstrates ability to understand and report RPE using BORG scale, to self-measure pulse accurately, and to acknowledge the importance of the exercise prescription.  Exercise Prescription Goal: Starting with aerobic activity 30 plus minutes a day, 3 days per week for initial exercise prescription. Provide home exercise prescription and guidelines that participant acknowledges understanding prior to discharge.  Activity Barriers & Risk Stratification:   6 Minute Walk:     6 Minute Walk      02/25/15 0900 04/16/15 1038 05/21/15 1047   6 Minute Walk   Phase Initial Mid Program Discharge   Distance 1475 feet 1600 feet 1650 feet   Walk Time 6 minutes 6 minutes 6 minutes   Resting HR 76 bpm 64 bpm 74 bpm   Resting BP 122/72 mmHg 122/72 mmHg 114/68 mmHg   Max Ex. HR 97 bpm 103 bpm 108 bpm   Max Ex. BP 144/70 mmHg 148/82 mmHg 162/72 mmHg   RPE 15 18 17    Perceived Dyspnea  4 6 5    Symptoms  No       Initial Exercise Prescription:   Exercise Prescription Changes:     Exercise  Prescription Changes      03/24/15 1400 03/26/15 1100 03/28/15 1000 03/31/15 1100 04/14/15 1100   Exercise Review   Progression    Yes Yes   Response to Exercise   Blood Pressure (Admit)     110/68 mmHg   Blood Pressure (Exercise)     132/74 mmHg   Blood Pressure (Exit)     120/82 mmHg   Heart Rate (Admit)     76 bpm   Heart Rate (Exercise)     94 bpm   Heart Rate (Exit)     83 bpm   Oxygen Saturation (Admit)     100 %   Oxygen Saturation (Exercise)     97 %   Oxygen Saturation (Exit)     100 %   Rating of Perceived Exertion (Exercise)     15   Perceived Dyspnea (Exercise)     4   Resistance Training   Training Prescription     Yes   Weight     5   Reps     10-12   Treadmill   MPH    2.6 2.6   Grade 1 1  1 1    Minutes     15   NuStep   Level    5 5   Watts    60 60   Minutes     10   Recumbant Elliptical   Level  4.5   4.5   Watts  55 60  60   Minutes     15     04/23/15 1100 04/28/15 1000 05/05/15 1100 05/07/15 1500 05/12/15 1100   Exercise Review   Progression Yes Yes Yes Yes Yes   Response to Exercise   Blood Pressure (Admit)    110/70 mmHg 110/70 mmHg   Blood Pressure (Exercise)    138/76 mmHg 138/76 mmHg   Blood Pressure (Exit)    132/68 mmHg 132/68 mmHg   Heart Rate (Admit)    68 bpm 68 bpm   Heart Rate (Exercise)    102 bpm 102 bpm   Heart Rate (Exit)    79 bpm 79 bpm   Oxygen Saturation (Admit)    100 % 100 %   Oxygen Saturation (Exercise)    99 % 99 %   Oxygen Saturation (Exit)    98 % 98 %   Rating of Perceived Exertion (Exercise)    12 12   Perceived Dyspnea (Exercise)    3 3   Treadmill   MPH 2.7 2.7 2.7     Grade 1 --  Interval Training: Elevation ranges for 1-5% --  Interval Training: Elevation ranges for 1-5%     Minutes 15       NuStep   Level   6  T5 NS     Watts   70     Recumbant Elliptical   Level  5.5 5.5     Watts  65 65  70   Minutes  15 15       05/19/15 1000 05/21/15 1000 05/30/15 1100 06/02/15 1000     Exercise Review    Progression Yes Yes  Yes    Resistance Training   Training Prescription    Yes    Weight    5    Reps  10-12    Treadmill   MPH 2.7 2.7  2.7    Grade --  Interval Training: Elevation ranges for 1-5% --  Interval Training: Elevation ranges for 1-5%  5    Minutes    15    NuStep   Level 6  T5 NS 6  T5 NS 6  T5 NS 5    Watts 70 70 80 80    Minutes    15    Recumbant Elliptical   Level 5 5  5     Watts 80 80  80    Minutes 15 15  15        Discharge Exercise Prescription (Final Exercise Prescription Changes):     Exercise Prescription Changes - 06/02/15 1000    Exercise Review   Progression Yes   Resistance Training   Training Prescription Yes   Weight 5   Reps 10-12   Treadmill   MPH 2.7   Grade 5   Minutes 15   NuStep   Level 5   Watts 80   Minutes 15   Recumbant Elliptical   Level 5   Watts 80   Minutes 15       Nutrition:  Target Goals: Understanding of nutrition guidelines, daily intake of sodium 1500mg , cholesterol 200mg , calories 30% from fat and 7% or less from saturated fats, daily to have 5 or more servings of fruits and vegetables.  Biometrics:      Post Biometrics - 05/21/15 1051     Post  Biometrics   Height 5\' 10"  (1.778 m)   Weight 283 lb 6.4 oz (128.549 kg)   BMI (Calculated) 40.7      Nutrition Therapy Plan and Nutrition Goals:   Nutrition Discharge: Rate Your Plate Scores:   Psychosocial: Target Goals: Acknowledge presence or absence of depression, maximize coping skills, provide positive support system. Participant is able to verbalize types and ability to use techniques and skills needed for reducing stress and depression.  Initial Review & Psychosocial Screening:   Quality of Life Scores:   PHQ-9:     Recent Review Flowsheet Data    There is no flowsheet data to display.      Psychosocial Evaluation and Intervention:   Psychosocial Re-Evaluation:     Psychosocial Re-Evaluation      05/21/15 1048            Psychosocial Re-Evaluation   Comments Counselor follow up with Mr. Capistran today prior to his upcoming discharge from this program.  He reports progres experienced of walking longer distance without shortness of breath & breathing better in general.  He continues to sleep well and maintains a healthy appetite.  Mr. Lukes reports he has benefitted from the educational components of this program as well; particularly setting realistic goals and being more patient with himself and his progress.  He plans to maintain this progress by working out at the gym consistently where he is a member.           Education: Education Goals: Education classes will be provided on a weekly basis, covering required topics. Participant will state understanding/return demonstration of topics presented.  Learning Barriers/Preferences:   Education Topics: Initial Evaluation Education: - Verbal, written and demonstration of respiratory meds, RPE/PD scales, oximetry and breathing techniques. Instruction on use of nebulizers and MDIs: cleaning and proper use, rinsing mouth with steroid doses and importance of monitoring MDI activations.   General Nutrition Guidelines/Fats and Fiber: -Group instruction provided by verbal, written material,  models and posters to present the general guidelines for heart healthy nutrition. Gives an explanation and review of dietary fats and fiber.          Most Recent Value   Date  05/19/15   Educator  CR   Instruction Review Code  2- meets goals/outcomes      Controlling Sodium/Reading Food Labels: -Group verbal and written material supporting the discussion of sodium use in heart healthy nutrition. Review and explanation with models, verbal and written materials for utilization of the food label.   Exercise Physiology & Risk Factors: - Group verbal and written instruction with models to review the exercise physiology of the cardiovascular system and associated critical  values. Details cardiovascular disease risk factors and the goals associated with each risk factor.   Aerobic Exercise & Resistance Training: - Gives group verbal and written discussion on the health impact of inactivity. On the components of aerobic and resistive training programs and the benefits of this training and how to safely progress through these programs.   Flexibility, Balance, General Exercise Guidelines: - Provides group verbal and written instruction on the benefits of flexibility and balance training programs. Provides general exercise guidelines with specific guidelines to those with heart or lung disease. Demonstration and skill practice provided.      Most Recent Value   Date  05/12/15   Educator  L. Brown,RT   Instruction Review Code  2- meets goals/outcomes      Stress Management: - Provides group verbal and written instruction about the health risks of elevated stress, cause of high stress, and healthy ways to reduce stress.      Most Recent Value   Date  04/23/15   Educator  [facility]   Instruction Review Code  2- meets goals/outcomes      Depression: - Provides group verbal and written instruction on the correlation between heart/lung disease and depressed mood, treatment options, and the stigmas associated with seeking treatment.      Most Recent Value   Date  05/07/15   Educator  [facility]   Instruction Review Code  2- meets goals/outcomes      Exercise & Equipment Safety: - Individual verbal instruction and demonstration of equipment use and safety with use of the equipment.   Infection Prevention: - Provides verbal and written material to individual with discussion of infection control including proper hand washing and proper equipment cleaning during exercise session.   Falls Prevention: - Provides verbal and written material to individual with discussion of falls prevention and safety.   Diabetes: - Individual verbal and written instruction to review  signs/symptoms of diabetes, desired ranges of glucose level fasting, after meals and with exercise. Advice that pre and post exercise glucose checks will be done for 3 sessions at entry of program.      Most Recent Value   Date  05/02/15   Educator  CE   Instruction Review Code  2- meets goals/outcomes      Chronic Lung Diseases: - Group verbal and written instruction to review new updates, new respiratory medications, new advancements in procedures and treatments. Provide informative websites and "800" numbers of self-education.      Most Recent Value   Date  03/31/15   Educator  LB   Instruction Review Code  2- meets goals/outcomes      Lung Procedures: - Group verbal and written instruction to describe testing methods done to diagnose lung disease. Review the outcome of test results. Describe the treatment choices:  Pulmonary Function Tests, ABGs and oximetry.   Energy Conservation: - Provide group verbal and written instruction for methods to conserve energy, plan and organize activities. Instruct on pacing techniques, use of adaptive equipment and posture/positioning to relieve shortness of breath.      Most Recent Value   Date  04/25/15   Educator  [facility]   Instruction Review Code  2- meets goals/outcomes      Triggers: - Group verbal and written instruction to review types of environmental controls: home humidity, furnaces, filters, dust mite/pet prevention, HEPA vacuums. To discuss weather changes, air quality and the benefits of nasal washing.      Most Recent Value   Date  06/02/15   Educator  LB   Instruction Review Code  2- meets goals/outcomes      Exacerbations: - Group verbal and written instruction to provide: warning signs, infection symptoms, calling MD promptly, preventive modes, and value of vaccinations. Review: effective airway clearance, coughing and/or vibration techniques. Create an Sport and exercise psychologistAction Plan.      Most Recent Value   Date  04/28/15   Educator   L. Manson PasseyBrown   Instruction Review Code  2- meets goals/outcomes      Oxygen: - Individual and group verbal and written instruction on oxygen therapy. Includes supplement oxygen, available portable oxygen systems, continuous and intermittent flow rates, oxygen safety, concentrators, and Medicare reimbursement for oxygen.   Respiratory Medications: - Group verbal and written instruction to review medications for lung disease. Drug class, frequency, complications, importance of spacers, rinsing mouth after steroid MDI's, and proper cleaning methods for nebulizers.   AED/CPR: - Group verbal and written instruction with the use of models to demonstrate the basic use of the AED with the basic ABC's of resuscitation.      Most Recent Value   Date  04/18/15   Educator  [facility]   Instruction Review Code  2- meets goals/outcomes      Breathing Retraining: - Provides individuals verbal and written instruction on purpose, frequency, and proper technique of diaphragmatic breathing and pursed-lipped breathing. Applies individual practice skills.   Anatomy and Physiology of the Lungs: - Group verbal and written instruction with the use of models to provide basic lung anatomy and physiology related to function, structure and complications of lung disease.      Most Recent Value   Date  05/16/15   Educator  SJ   Instruction Review Code  2- meets goals/outcomes      Heart Failure: - Group verbal and written instruction on the basics of heart failure: signs/symptoms, treatments, explanation of ejection fraction, enlarged heart and cardiomyopathy.   Sleep Apnea: - Individual verbal and written instruction to review Obstructive Sleep Apnea. Review of risk factors, methods for diagnosing and types of masks and machines for OSA.   Anxiety: - Provides group, verbal and written instruction on the correlation between heart/lung disease and anxiety, treatment options, and management of  anxiety.   Relaxation: - Provides group, verbal and written instruction about the benefits of relaxation for patients with heart/lung disease. Also provides patients with examples of relaxation techniques.   Knowledge Questionnaire Score:   Personal Goals and Risk Factors at Admission:     Personal Goals and Risk Factors at Admission - 02/25/15 0900    Personal Goals and Risk Factors on Admission    Weight Management Yes   Intervention Learn and follow the exercise and diet guidelines while in the program. Utilize the nutrition and education classes to help gain  knowledge of the diet and exercise expectations in the program   Admit Weight 290 lb (131.543 kg)   Goal Weight 260 lb (117.935 kg)   Increase Aerobic Exercise and Physical Activity Yes   Intervention While in program, learn and follow the exercise prescription taught. Start at a low level workload and increase workload after able to maintain previous level for 30 minutes. Increase time before increasing intensity.   Understand more about Heart/Pulmonary Disease. Yes   Intervention While in program utilize professionals for any questions, and attend the education sessions. Great websites to use are www.americanheart.org or www.lung.org for reliable information.   Improve shortness of breath with ADL's Yes   Intervention While in program, learn and follow the exercise prescription taught. Start at a low level workload and increase workload ad advised by the exercise physiologist. Increase time before increasing intensity.   Develop more efficient breathing techniques such as purse lipped breathing and diaphragmatic breathing; and practicing self-pacing with activity Yes   Intervention While in program, learn and utilize the specific breathing techniques taught to you. Continue to practice and use the techniques as needed.   Increase knowledge of respiratory medications and ability to use respiratory devices properly.  Yes    Intervention While in program, learn to administer MDI, nebulizer, and spacer properly.;Learn to take respiratory medicine as ordered.;While in program, learn to Clean MDI, nebulizers, and spacers properly.   Diabetes Yes   Goal Blood glucose control identified by blood glucose values, HgbA1C. Participant verbalizes understanding of the signs/symptoms of hyper/hypo glycemia, proper foot care and importance of medication and nutrition plan for blood glucose control.   Intervention Provide nutrition & aerobic exercise along with prescribed medications to achieve blood glucose in normal ranges: Fasting 65-99 mg/dL   Hypertension Yes   Goal Participant will see blood pressure controlled within the values of 140/101mm/Hg or within value directed by their physician.   Intervention Provide nutrition & aerobic exercise along with prescribed medications to achieve BP 140/90 or less.   Lipids Yes   Goal Cholesterol controlled with medications as prescribed, with individualized exercise RX and with personalized nutrition plan. Value goals: LDL < 70mg , HDL > 40mg . Participant states understanding of desired cholesterol values and following prescriptions.   Intervention Provide nutrition & aerobic exercise along with prescribed medications to achieve LDL 70mg , HDL >40mg .      Personal Goals and Risk Factors Review:      Goals and Risk Factor Review      04/16/15 1640 04/28/15 1014 04/30/15 1000 05/09/15 1535 05/14/15 1207   Increase Aerobic Exercise and Physical Activity   Goals Progress/Improvement seen  Yes Yes   Yes   Comments Improved mid by 157ft Started [place]arl on interval training on the treadmill to continue to progress him aerobically. He said he still gets winded on any incline, therefore the intervals involve progressively taking the incline up and down.   Started doing the treadmill interval training twice in each class period. Is really enjoying the interval training and seeing the difference  it makes in his breathing when he does strenuous tasks   Understand more about Heart/Pulmonary Disease   Goals Progress/Improvement seen    Yes     Comments   Mr Sippel has attended the educational classes and states the information has been very helpful in managing his lung disease.     Improve shortness of breath with ADL's   Goals Progress/Improvement seen  Yes   Yes  Comments Improved Shortness of Breath Questionnaire by 33 points   Mr. Goldring states that it is easier to preform his daily routine.  He is also finding that he can walk longer distances with less SOB.    Increase knowledge of respiratory medications   Goals Progress/Improvement seen    Yes     Comments   Mr Fullbright uses his Advair MDI and Ventolin MDI properly.       05/21/15 1000           Increase Aerobic Exercise and Physical Activity   Goals Progress/Improvement seen  Yes       Comments Mr Gadson improved his Post by 137ft.          Personal Goals Discharge:      Personal Goals at Discharge - 05/28/15 0650    Increase Aerobic Exercise and Physical Activity   Goals Progress/Improvement seen  Yes   Comments Mr Padula has increased his exercise goals on the TM, T5, and REL; increased his weights from 3lb to 5lb.  He also started interval training on the treadmill with sets of 1-5% grade. 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   Understand more about Heart/Pulmonary Disease   Goals Progress/Improvement seen  Yes   Comments Not only has Mr Urquilla attended the educational classes, but he has offered information which I can use for future classses.   Improve shortness of breath with ADL's   Goals Progress/Improvement seen  Yes   Comments Mr Kochel's UCSD Questionnaire has improved 27 points ; Minimal Importance Difference is 5 points.   Increase knowledge of respiratory medications   Goals Progress/Improvement seen  Yes   Comments Using his  respiratory inhalers properly.      Comments:

## 2015-06-02 NOTE — Patient Instructions (Signed)
Discharge Instructions  Patient Details  Name: [name] MRN: [id number] Date of Birth: [birth year] Referring Provider:  Dorothey Baseman, MD   Number of Visits: 85  Reason for Discharge:  Patient reached a stable level of exercise. Patient independent in their exercise.  Smoking History:  History  Smoking status  . Never Smoker   Smokeless tobacco  . Not on file    Diagnosis:  No diagnosis found.  Initial Exercise Prescription:   Discharge Exercise Prescription (Final Exercise Prescription Changes):     Exercise Prescription Changes - 06/02/15 1000    Exercise Review   Progression Yes   Resistance Training   Training Prescription Yes   Weight 5   Reps 10-12   Treadmill   MPH 2.7   Grade 5   Minutes 15   NuStep   Level 5   Watts 80   Minutes 15   Recumbant Elliptical   Level 5   Watts 80   Minutes 15      Functional Capacity:     6 Minute Walk      02/25/15 0900 04/16/15 1038 05/21/15 1047   6 Minute Walk   Phase Initial Mid Program Discharge   Distance 1475 feet 1600 feet 1650 feet   Walk Time 6 minutes 6 minutes 6 minutes   Resting HR 76 bpm 64 bpm 74 bpm   Resting BP 122/72 mmHg 122/72 mmHg 114/68 mmHg   Max Ex. HR 97 bpm 103 bpm 108 bpm   Max Ex. BP 144/70 mmHg 148/82 mmHg 162/72 mmHg   RPE Perceived Dyspnea  Symptoms  No       Quality of Life:   Personal Goals: Goals established at orientation with interventions provided to work toward goal.     Personal Goals and Risk Factors at Admission - 02/25/15 0900    Personal Goals and Risk Factors on Admission    Weight Management Yes   Intervention Learn and follow the exercise and diet guidelines while in the program. Utilize the nutrition and education classes to help gain knowledge of the diet and exercise expectations in the program   Admit Weight 290 lb (131.543 kg)   Goal Weight 260 lb (117.935 kg)   Increase Aerobic Exercise and Physical Activity Yes   Intervention While in program, learn and follow the exercise prescription taught. Start at a low level workload and increase workload after able to maintain previous level for 30 minutes. Increase time before increasing intensity.   Understand more about Heart/Pulmonary Disease. Yes   Intervention While in program utilize professionals for any questions, and attend the education sessions. Great websites to use are www.americanheart.org or www.lung.org for reliable information.   Improve shortness of breath with ADL's Yes   Intervention While in program, learn and follow the exercise prescription taught. Start at a low level workload and increase workload ad advised by the exercise physiologist. Increase time before increasing intensity.   Develop more efficient breathing techniques such as purse lipped breathing and diaphragmatic breathing; and practicing self-pacing with activity Yes   Intervention While in program, learn and utilize the specific breathing techniques taught to you. Continue to practice and use the techniques as needed.   Increase knowledge of respiratory medications and ability to use respiratory devices properly.  Yes   Intervention While in program, learn to administer MDI, nebulizer, and spacer properly.;Learn to take respiratory medicine as ordered.;While in program, learn to Clean MDI,  nebulizers, and spacers properly.   Diabetes Yes   Goal Blood glucose control identified by blood glucose values, HgbA1C. Participant verbalizes understanding of the signs/symptoms of hyper/hypo glycemia, proper foot care and importance of medication and nutrition plan for blood glucose control.   Intervention Provide nutrition & aerobic exercise along with prescribed medications to achieve blood glucose in normal ranges: Fasting 65-99 mg/dL   Hypertension Yes   Goal Participant will see blood pressure controlled within the values of 140/690mm/Hg or within value directed by their physician.    Intervention Provide nutrition & aerobic exercise along with prescribed medications to achieve BP 140/90 or less.   Lipids Yes   Goal Cholesterol controlled with medications as prescribed, with individualized exercise RX and with personalized nutrition plan. Value goals: LDL < 70mg , HDL > 40mg . Participant states understanding of desired cholesterol values and following prescriptions.   Intervention Provide nutrition & aerobic exercise along with prescribed medications to achieve LDL 70mg , HDL >40mg .       Personal Goals Discharge:     Personal Goals at Discharge - 05/28/15 0650    Increase Aerobic Exercise and Physical Activity   Goals Progress/Improvement seen  Yes   Comments Mr [name] has increased his exercise goals on the TM, T5, and REL; increased his weights from 3lb to 5lb.  He also started interval training on the treadmill with sets of 1-5% grade. 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   Understand more about Heart/Pulmonary Disease   Goals Progress/Improvement seen  Yes   Comments Not only has Mr [name] attended the educational classes, but he has offered information which I can use for future classses.   Improve shortness of breath with ADL's   Goals Progress/Improvement seen  Yes   Comments Mr [name]'s [employer] Questionnaire has improved 27 points ; Minimal Importance Difference is 5 points.   Increase knowledge of respiratory medications   Goals Progress/Improvement seen  Yes   Comments Using his respiratory inhalers properly.      Nutrition & Weight - Outcomes:      Post Biometrics - 05/21/15 1051     Post  Biometrics   Height 5\' 10"  (1.778 m)   Weight 283 lb 6.4 oz (128.549 kg)   BMI (Calculated) 40.7      Nutrition:   Nutrition Discharge:   Education Questionnaire Score:   Goals reviewed with patient; copy given to patient.

## 2015-06-02 NOTE — Progress Notes (Signed)
Pulmonary Individual Treatment Plan  Patient Details  Name: [name] MRN: [id number] Date of Birth: 10-04-1956 Referring Provider:  Dr Julien Nordmann  Initial Encounter Date: 02/25/2015  Visit Diagnosis: Morbid obesity  Patient's Home Medications on Admission:  Current outpatient prescriptions:    acetaminophen (TYLENOL) 325 MG tablet, Take 650 mg by mouth every 6 (six) hours as needed., Disp: , Rfl:    albuterol (PROVENTIL HFA;VENTOLIN HFA) 108 (90 BASE) MCG/ACT inhaler, Inhale into the lungs every 6 (six) hours as needed for wheezing or shortness of breath., Disp: , Rfl:    ALPRAZolam (XANAX) 0.5 MG tablet, Take 1/2 tablet in the am with 1 tablet in the pm., Disp: , Rfl:    aspirin 81 MG tablet, Take 81 mg by mouth daily., Disp: , Rfl:    atorvastatin (LIPITOR) 40 MG tablet, Take 40 mg by mouth daily., Disp: , Rfl:    buPROPion (WELLBUTRIN SR) 150 MG 12 hr tablet, Take 150 mg by mouth 2 (two) times daily., Disp: , Rfl:    clopidogrel (PLAVIX) 75 MG tablet, Take 75 mg by mouth daily., Disp: , Rfl:    fluticasone-salmeterol (ADVAIR HFA) 115-21 MCG/ACT inhaler, Inhale 2 puffs into the lungs 2 (two) times daily., Disp: , Rfl:    furosemide (LASIX) 20 MG tablet, Take 20 mg by mouth., Disp: , Rfl:    gabapentin (NEURONTIN) 300 MG capsule, Take 300 mg by mouth 3 (three) times daily., Disp: , Rfl:    ibuprofen (ADVIL,MOTRIN) 200 MG tablet, Take 200 mg by mouth every 6 (six) hours as needed., Disp: , Rfl:    metFORMIN (GLUCOPHAGE) 850 MG tablet, Take 850 mg by mouth daily with breakfast. , Disp: , Rfl:    metoprolol tartrate (LOPRESSOR) 25 MG tablet, Take 12.5 mg by mouth 2 (two) times daily., Disp: , Rfl:    nitroGLYCERIN (NITROSTAT) 0.4 MG SL tablet, Place 0.4 mg under the tongue every 5 (five) minutes as needed for chest pain., Disp: , Rfl:    potassium chloride SA (K-DUR,KLOR-CON) 20 MEQ tablet, Take 20 mEq by mouth daily. , Disp: , Rfl:    RABEprazole (ACIPHEX) 20 MG  tablet, Take 20 mg by mouth 2 (two) times daily. , Disp: , Rfl:    ranitidine (ZANTAC) 150 MG capsule, Take 150 mg by mouth 2 (two) times daily., Disp: , Rfl:    ranolazine (RANEXA) 1000 MG SR tablet, Take 1 tablet (1,000 mg total) by mouth 2 (two) times daily., Disp: 60 tablet, Rfl: 6   senna (SENOKOT) 8.6 MG tablet, Take 1 tablet by mouth daily., Disp: , Rfl:    venlafaxine (EFFEXOR) 75 MG tablet, Take 75 mg by mouth 2 (two) times daily., Disp: , Rfl:   Past Medical History: Past Medical History  Diagnosis Date   Type 2 diabetes mellitus    Coronary artery disease    GERD (gastroesophageal reflux disease)    Hyperlipidemia    Hypertension    Atherosclerosis of coronary artery bypass graft with angina pectoris    Sleep apnea    Barrett's esophagus    Anxiety    H/O esophageal spasm    Chronic constipation    Colon polyp    MI (myocardial infarction)     Tobacco Use: History  Smoking status   Never Smoker   Smokeless tobacco   Not on file    Labs: Recent Review Flowsheet Data    Labs for ITP Cardiac and Pulmonary Rehab Latest Ref Rng 04/22/2009 11/18/2009 11/19/2009  Cholestrol 0 - 200 mg/dL - - 742 ATP III CLASSIFICATION: <200     mg/dL   Desirable 595-638  mg/dL   Borderline High >=756    mg/dL   High   LDLCALC 0 - 99 mg/dL - - 70 Total Cholesterol/HDL:CHD Risk Coronary Heart Disease Risk Table Men   Women 1/2 Average Risk   3.4   3.3 Average Risk       5.0   4.4 2 X Average Risk   9.6   7.1 3 X Average Risk  23.4   11.0 Use the calculated Patient Ratio above and the CHD Risk Table to determine the patient's CHD Risk. ATP III CLASSIFICATION (LDL): <100     mg/dL   Optimal 433-295  mg/dL   Near or Above Optimal 130-159  mg/dL   Borderline 188-416  mg/dL   High >606     mg/dL   Very High   HDL >30 mg/dL - - 16(W)   Trlycerides <150 mg/dL - - 75   Hemoglobin F0X 4.6 - 6.1 % - 5.7 (NOTE) The ADA recommends the following therapeutic goal for  glycemic control related to Hgb A1c measurement: Goal of therapy: <6.5 Hgb A1c  Reference: American Diabetes Association: Clinical Practice Recommendations 2010, Diabetes Care, 2010, 33: (Suppl 1). -   TCO2 0 - 100 mmol/L 21 - -         POCT Glucose      04/16/15 1629           POCT Blood Glucose   Pre-Exercise 133 mg/dL       Post-Exercise 323 mg/dL       Pre-Exercise #2 557 mg/dL       Post-Exercise #2 128 mg/dL       Pre-Exercise #3 322 mg/dL       Post-Exercise #3 152 mg/dL          ADL UCSD:     ADL UCSD      02/25/15 0900 04/16/15 1000 05/23/15 1000   ADL UCSD   ADL Phase Entry Mid Exit   SOB Score total 79 46 52   Rest 2 0 0   Walk 2 2 0   Stairs 5 4 5    Bath 2 0 0   Dress 1 0 0   Shop 2 3 3        Pulmonary Function Assessment:   Exercise Target Goals:    Exercise Program Goal: Individual exercise prescription set with THRR, safety & activity barriers. Participant demonstrates ability to understand and report RPE using BORG scale, to self-measure pulse accurately, and to acknowledge the importance of the exercise prescription.  Exercise Prescription Goal: Starting with aerobic activity 30 plus minutes a day, 3 days per week for initial exercise prescription. Provide home exercise prescription and guidelines that participant acknowledges understanding prior to discharge.  Activity Barriers & Risk Stratification:   6 Minute Walk:     6 Minute Walk      02/25/15 0900 04/16/15 1038 05/21/15 1047   6 Minute Walk   Phase Initial Mid Program Discharge   Distance 1475 feet 1600 feet 1650 feet   Walk Time 6 minutes 6 minutes 6 minutes   Resting HR 76 bpm 64 bpm 74 bpm   Resting BP 122/72 mmHg 122/72 mmHg 114/68 mmHg   Max Ex. HR 97 bpm 103 bpm 108 bpm   Max Ex. BP 144/70 mmHg 148/82 mmHg 162/72 mmHg   RPE 15 18 17    Perceived Dyspnea  4 6 5    Symptoms  No       Initial Exercise Prescription:   Exercise Prescription Changes:     Exercise  Prescription Changes      03/24/15 1400 03/26/15 1100 03/28/15 1000 03/31/15 1100 04/14/15 1100   Exercise Review   Progression    Yes Yes   Response to Exercise   Blood Pressure (Admit)     110/68 mmHg   Blood Pressure (Exercise)     132/74 mmHg   Blood Pressure (Exit)     120/82 mmHg   Heart Rate (Admit)     76 bpm   Heart Rate (Exercise)     94 bpm   Heart Rate (Exit)     83 bpm   Oxygen Saturation (Admit)     100 %   Oxygen Saturation (Exercise)     97 %   Oxygen Saturation (Exit)     100 %   Rating of Perceived Exertion (Exercise)     15   Perceived Dyspnea (Exercise)     4   Resistance Training   Training Prescription     Yes   Weight     5   Reps     10-12   Treadmill   MPH    2.6 2.6   Grade 1 1  1 1    Minutes     15   NuStep   Level    5 5   Watts    60 60   Minutes     10   Recumbant Elliptical   Level  4.5   4.5   Watts  55 60  60   Minutes     15     04/23/15 1100 04/28/15 1000 05/05/15 1100 05/07/15 1500 05/12/15 1100   Exercise Review   Progression Yes Yes Yes Yes Yes   Response to Exercise   Blood Pressure (Admit)    110/70 mmHg 110/70 mmHg   Blood Pressure (Exercise)    138/76 mmHg 138/76 mmHg   Blood Pressure (Exit)    132/68 mmHg 132/68 mmHg   Heart Rate (Admit)    68 bpm 68 bpm   Heart Rate (Exercise)    102 bpm 102 bpm   Heart Rate (Exit)    79 bpm 79 bpm   Oxygen Saturation (Admit)    100 % 100 %   Oxygen Saturation (Exercise)    99 % 99 %   Oxygen Saturation (Exit)    98 % 98 %   Rating of Perceived Exertion (Exercise)    12 12   Perceived Dyspnea (Exercise)    3 3   Treadmill   MPH 2.7 2.7 2.7     Grade 1 --  Interval Training: Elevation ranges for 1-5% --  Interval Training: Elevation ranges for 1-5%     Minutes 15       NuStep   Level   6  T5 NS     Watts   70     Recumbant Elliptical   Level  5.5 5.5     Watts  65 65  70   Minutes  15 15       05/19/15 1000 05/21/15 1000 05/30/15 1100 06/02/15 1000     Exercise Review    Progression Yes Yes  Yes    Resistance Training   Training Prescription    Yes    Weight    5    Reps  10-12    Treadmill   MPH 2.7 2.7  2.7    Grade --  Interval Training: Elevation ranges for 1-5% --  Interval Training: Elevation ranges for 1-5%  5    Minutes    15    NuStep   Level 6  T5 NS 6  T5 NS 6  T5 NS 5    Watts 70 70 80 80    Minutes    15    Recumbant Elliptical   Level 5 5  5     Watts 80 80  80    Minutes 15 15  15        Discharge Exercise Prescription (Final Exercise Prescription Changes):     Exercise Prescription Changes - 06/02/15 1000    Exercise Review   Progression Yes   Resistance Training   Training Prescription Yes   Weight 5   Reps 10-12   Treadmill   MPH 2.7   Grade 5   Minutes 15   NuStep   Level 5   Watts 80   Minutes 15   Recumbant Elliptical   Level 5   Watts 80   Minutes 15       Nutrition:  Target Goals: Understanding of nutrition guidelines, daily intake of sodium 1500mg , cholesterol 200mg , calories 30% from fat and 7% or less from saturated fats, daily to have 5 or more servings of fruits and vegetables.  Biometrics:      Post Biometrics - 05/21/15 1051     Post  Biometrics   Height 5\' 10"  (1.778 m)   Weight 283 lb 6.4 oz (128.549 kg)   BMI (Calculated) 40.7      Nutrition Therapy Plan and Nutrition Goals:   Nutrition Discharge: Rate Your Plate Scores:   Psychosocial: Target Goals: Acknowledge presence or absence of depression, maximize coping skills, provide positive support system. Participant is able to verbalize types and ability to use techniques and skills needed for reducing stress and depression.  Initial Review & Psychosocial Screening:   Quality of Life Scores:   PHQ-9:     Recent Review Flowsheet Data    There is no flowsheet data to display.      Psychosocial Evaluation and Intervention:   Psychosocial Re-Evaluation:     Psychosocial Re-Evaluation      05/21/15 1048            Psychosocial Re-Evaluation   Comments Counselor follow up with Lee. [name] today prior to his upcoming discharge from this program.  He reports progres experienced of walking longer distance without shortness of breath & breathing better in general.  He continues to sleep well and maintains a healthy appetite.  [name] reports he has benefitted from the educational components of this program as well; particularly setting realistic goals and being more patient with himself and his progress.  He plans to maintain this progress by working out at the gym consistently where he is a member.           Education: Education Goals: Education classes will be provided on a weekly basis, covering required topics. Participant will state understanding/return demonstration of topics presented.  Learning Barriers/Preferences:   Education Topics: Initial Evaluation Education: - Verbal, written and demonstration of respiratory meds, RPE/PD scales, oximetry and breathing techniques. Instruction on use of nebulizers and MDIs: cleaning and proper use, rinsing mouth with steroid doses and importance of monitoring MDI activations.   General Nutrition Guidelines/Fats and Fiber: -Group instruction provided by verbal, written material,  models and posters to present the general guidelines for heart healthy nutrition. Gives an explanation and review of dietary fats and fiber.          Most Recent Value   Date  05/19/15   Educator  CR   Instruction Review Code  2- meets goals/outcomes      Controlling Sodium/Reading Food Labels: -Group verbal and written material supporting the discussion of sodium use in heart healthy nutrition. Review and explanation with models, verbal and written materials for utilization of the food label.   Exercise Physiology & Risk Factors: - Group verbal and written instruction with models to review the exercise physiology of the cardiovascular system and associated critical  values. Details cardiovascular disease risk factors and the goals associated with each risk factor.   Aerobic Exercise & Resistance Training: - Gives group verbal and written discussion on the health impact of inactivity. On the components of aerobic and resistive training programs and the benefits of this training and how to safely progress through these programs.   Flexibility, Balance, General Exercise Guidelines: - Provides group verbal and written instruction on the benefits of flexibility and balance training programs. Provides general exercise guidelines with specific guidelines to those with heart or lung disease. Demonstration and skill practice provided.      Most Recent Value   Date  05/12/15   Educator  L. Brown,RT   Instruction Review Code  2- meets goals/outcomes      Stress Management: - Provides group verbal and written instruction about the health risks of elevated stress, cause of high stress, and healthy ways to reduce stress.      Most Recent Value   Date  04/23/15   Educator  [facility]   Instruction Review Code  2- meets goals/outcomes      Depression: - Provides group verbal and written instruction on the correlation between heart/lung disease and depressed mood, treatment options, and the stigmas associated with seeking treatment.      Most Recent Value   Date  05/07/15   Educator  [facility]   Instruction Review Code  2- meets goals/outcomes      Exercise & Equipment Safety: - Individual verbal instruction and demonstration of equipment use and safety with use of the equipment.   Infection Prevention: - Provides verbal and written material to individual with discussion of infection control including proper hand washing and proper equipment cleaning during exercise session.   Falls Prevention: - Provides verbal and written material to individual with discussion of falls prevention and safety.   Diabetes: - Individual verbal and written instruction to review  signs/symptoms of diabetes, desired ranges of glucose level fasting, after meals and with exercise. Advice that pre and post exercise glucose checks will be done for 3 sessions at entry of program.      Most Recent Value   Date  05/02/15   Educator  CE   Instruction Review Code  2- meets goals/outcomes      Chronic Lung Diseases: - Group verbal and written instruction to review new updates, new respiratory medications, new advancements in procedures and treatments. Provide informative websites and "800" numbers of self-education.      Most Recent Value   Date  03/31/15   Educator  LB   Instruction Review Code  2- meets goals/outcomes      Lung Procedures: - Group verbal and written instruction to describe testing methods done to diagnose lung disease. Review the outcome of test results. Describe the treatment choices:  Pulmonary Function Tests, ABGs and oximetry.   Energy Conservation: - Provide group verbal and written instruction for methods to conserve energy, plan and organize activities. Instruct on pacing techniques, use of adaptive equipment and posture/positioning to relieve shortness of breath.      Most Recent Value   Date  04/25/15   Educator  [facility]   Instruction Review Code  2- meets goals/outcomes      Triggers: - Group verbal and written instruction to review types of environmental controls: home humidity, furnaces, filters, dust mite/pet prevention, HEPA vacuums. To discuss weather changes, air quality and the benefits of nasal washing.      Most Recent Value   Date  06/02/15   Educator  LB   Instruction Review Code  2- meets goals/outcomes      Exacerbations: - Group verbal and written instruction to provide: warning signs, infection symptoms, calling MD promptly, preventive modes, and value of vaccinations. Review: effective airway clearance, coughing and/or vibration techniques. Create an Sport and exercise psychologistAction Plan.      Most Recent Value   Date  04/28/15   Educator   L. Manson PasseyBrown   Instruction Review Code  2- meets goals/outcomes      Oxygen: - Individual and group verbal and written instruction on oxygen therapy. Includes supplement oxygen, available portable oxygen systems, continuous and intermittent flow rates, oxygen safety, concentrators, and Medicare reimbursement for oxygen.   Respiratory Medications: - Group verbal and written instruction to review medications for lung disease. Drug class, frequency, complications, importance of spacers, rinsing mouth after steroid MDI's, and proper cleaning methods for nebulizers.   AED/CPR: - Group verbal and written instruction with the use of models to demonstrate the basic use of the AED with the basic ABC's of resuscitation.      Most Recent Value   Date  04/18/15   Educator  [facility]   Instruction Review Code  2- meets goals/outcomes      Breathing Retraining: - Provides individuals verbal and written instruction on purpose, frequency, and proper technique of diaphragmatic breathing and pursed-lipped breathing. Applies individual practice skills.   Anatomy and Physiology of the Lungs: - Group verbal and written instruction with the use of models to provide basic lung anatomy and physiology related to function, structure and complications of lung disease.      Most Recent Value   Date  05/16/15   Educator  SJ   Instruction Review Code  2- meets goals/outcomes      Heart Failure: - Group verbal and written instruction on the basics of heart failure: signs/symptoms, treatments, explanation of ejection fraction, enlarged heart and cardiomyopathy.   Sleep Apnea: - Individual verbal and written instruction to review Obstructive Sleep Apnea. Review of risk factors, methods for diagnosing and types of masks and machines for OSA.   Anxiety: - Provides group, verbal and written instruction on the correlation between heart/lung disease and anxiety, treatment options, and management of  anxiety.   Relaxation: - Provides group, verbal and written instruction about the benefits of relaxation for patients with heart/lung disease. Also provides patients with examples of relaxation techniques.   Knowledge Questionnaire Score:   Personal Goals and Risk Factors at Admission:     Personal Goals and Risk Factors at Admission - 02/25/15 0900    Personal Goals and Risk Factors on Admission    Weight Management Yes   Intervention Learn and follow the exercise and diet guidelines while in the program. Utilize the nutrition and education classes to help gain  knowledge of the diet and exercise expectations in the program   Admit Weight 290 lb (131.543 kg)   Goal Weight 260 lb (117.935 kg)   Increase Aerobic Exercise and Physical Activity Yes   Intervention While in program, learn and follow the exercise prescription taught. Start at a low level workload and increase workload after able to maintain previous level for 30 minutes. Increase time before increasing intensity.   Understand more about Heart/Pulmonary Disease. Yes   Intervention While in program utilize professionals for any questions, and attend the education sessions. Great websites to use are www.americanheart.org or www.lung.org for reliable information.   Improve shortness of breath with ADL's Yes   Intervention While in program, learn and follow the exercise prescription taught. Start at a low level workload and increase workload ad advised by the exercise physiologist. Increase time before increasing intensity.   Develop more efficient breathing techniques such as purse lipped breathing and diaphragmatic breathing; and practicing self-pacing with activity Yes   Intervention While in program, learn and utilize the specific breathing techniques taught to you. Continue to practice and use the techniques as needed.   Increase knowledge of respiratory medications and ability to use respiratory devices properly.  Yes    Intervention While in program, learn to administer MDI, nebulizer, and spacer properly.;Learn to take respiratory medicine as ordered.;While in program, learn to Clean MDI, nebulizers, and spacers properly.   Diabetes Yes   Goal Blood glucose control identified by blood glucose values, HgbA1C. Participant verbalizes understanding of the signs/symptoms of hyper/hypo glycemia, proper foot care and importance of medication and nutrition plan for blood glucose control.   Intervention Provide nutrition & aerobic exercise along with prescribed medications to achieve blood glucose in normal ranges: Fasting 65-99 mg/dL   Hypertension Yes   Goal Participant will see blood pressure controlled within the values of 140/45mm/Hg or within value directed by their physician.   Intervention Provide nutrition & aerobic exercise along with prescribed medications to achieve BP 140/90 or less.   Lipids Yes   Goal Cholesterol controlled with medications as prescribed, with individualized exercise RX and with personalized nutrition plan. Value goals: LDL < 70mg , HDL > 40mg . Participant states understanding of desired cholesterol values and following prescriptions.   Intervention Provide nutrition & aerobic exercise along with prescribed medications to achieve LDL 70mg , HDL >40mg .      Personal Goals and Risk Factors Review:      Goals and Risk Factor Review      04/16/15 1640 04/28/15 1014 04/30/15 1000 05/09/15 1535 05/14/15 1207   Increase Aerobic Exercise and Physical Activity   Goals Progress/Improvement seen  Yes Yes   Yes   Comments Improved mid by 122ft Started Beniah on interval training on the treadmill to continue to progress him aerobically. He said he still gets winded on any incline, therefore the intervals involve progressively taking the incline up and down.   Started doing the treadmill interval training twice in each class period. Is really enjoying the interval training and seeing the difference  it makes in his breathing when he does strenuous tasks   Understand more about Heart/Pulmonary Disease   Goals Progress/Improvement seen    Yes     Comments   [name] has attended the educational classes and states the information has been very helpful in managing his lung disease.     Improve shortness of breath with ADL's   Goals Progress/Improvement seen  Yes   Yes  Comments Improved Shortness of Breath Questionnaire by 33 points   [name] states that it is easier to preform his daily routine.  He is also finding that he can walk longer distances with less SOB.    Increase knowledge of respiratory medications   Goals Progress/Improvement seen    Yes     Comments   [name] uses his Advair MDI and Ventolin MDI properly.       05/21/15 1000           Increase Aerobic Exercise and Physical Activity   Goals Progress/Improvement seen  Yes       Comments [name] improved his Post by 130ft.          Personal Goals Discharge:      Personal Goals at Discharge - 05/28/15 0650    Increase Aerobic Exercise and Physical Activity   Goals Progress/Improvement seen  Yes   Comments [name] has increased his exercise goals on the TM, T5, and REL; increased his weights from 3lb to 5lb.  He also started interval training on the treadmill with sets of 1-5% grade. 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   Understand more about Heart/Pulmonary Disease   Goals Progress/Improvement seen  Yes   Comments Not only has [name] attended the educational classes, but he has offered information which I can use for future classses.   Improve shortness of breath with ADL's   Goals Progress/Improvement seen  Yes   Comments [name]'s UCSD Questionnaire has improved 27 points ; Minimal Importance Difference is 5 points.   Increase knowledge of respiratory medications   Goals Progress/Improvement seen  Yes   Comments Using his  respiratory inhalers properly.      Comments: Thank you for the opportunity to work with your patient, [name].

## 2015-06-19 ENCOUNTER — Other Ambulatory Visit: Payer: Self-pay | Admitting: Cardiovascular Disease

## 2015-06-19 MED ORDER — FUROSEMIDE 20 MG PO TABS: 20.0000 mg | ORAL_TABLET | ORAL | Status: DC

## 2015-06-19 NOTE — Telephone Encounter (Signed)
Refill sent for Lasix every other day.

## 2015-06-20 ENCOUNTER — Ambulatory Visit (INDEPENDENT_AMBULATORY_CARE_PROVIDER_SITE_OTHER): Payer: BLUE CROSS/BLUE SHIELD | Admitting: Cardiovascular Disease

## 2015-06-20 ENCOUNTER — Encounter: Payer: Self-pay | Admitting: Cardiovascular Disease

## 2015-06-20 VITALS — BP 88/62 | HR 67 | Ht 70.0 in | Wt 279.8 lb

## 2015-06-20 DIAGNOSIS — R0789 Other chest pain: Secondary | ICD-10-CM | POA: Diagnosis not present

## 2015-06-20 DIAGNOSIS — E1159 Type 2 diabetes mellitus with other circulatory complications: Secondary | ICD-10-CM | POA: Diagnosis not present

## 2015-06-20 DIAGNOSIS — I951 Orthostatic hypotension: Secondary | ICD-10-CM

## 2015-06-20 DIAGNOSIS — Z951 Presence of aortocoronary bypass graft: Secondary | ICD-10-CM

## 2015-06-20 DIAGNOSIS — E785 Hyperlipidemia, unspecified: Secondary | ICD-10-CM

## 2015-06-20 MED ORDER — MIDODRINE HCL 10 MG PO TABS: 10.0000 mg | ORAL_TABLET | Freq: Three times a day (TID) | ORAL | Status: DC | PRN

## 2015-06-20 NOTE — Assessment & Plan Note (Signed)
Recommended that he continue on his Lipitor 40 mg daily Goal LDL less than 70 given CAD

## 2015-06-20 NOTE — Patient Instructions (Addendum)
Blood pressure is low Please decrease the lasix down to as needed  Take midodrine 1/2 up to a full pill three times a day for blood pressure support  Please call us if you have new issues that need to be addressed before your next appt.  Your physician wants you to follow-up in: 2 months.

## 2015-06-20 NOTE — Assessment & Plan Note (Signed)
Complemented him on his regular exercise regimen

## 2015-06-20 NOTE — Assessment & Plan Note (Signed)
Managed by primary care We have encouraged continued exercise, careful diet management in an effort to lose weight.  

## 2015-06-20 NOTE — Assessment & Plan Note (Addendum)
Blood pressure was low on his prior clinic visit with Lasix daily. Previously recommended he take this every other day Blood pressure very low, creatinine elevated consistent with dehydration Now Recommended he hold his Lasix Hold the potassium Recommended he only take Lasix as needed for significant leg swelling or shortness of breath Recommended he start midodrine at least twice per day with close monitoring of his blood pressure (a.m. and after lunch) Suggested he increase his by mouth fluid and salt intake for days when systolic pressures less than 100

## 2015-06-20 NOTE — Progress Notes (Signed)
Patient ID: [name], male    DOB: 1956-04-22, 59 y.o.   MRN: [id number]  HPI Comments: [name] is a 59 year old gentleman with obesity, coronary artery disease, stenting to his RCA and circumflex in 2010, bypass surgery March 2014 with vein graft to the OM, LIMA to the LAD, hyperlipidemia, obstructive sleep apnea with compliance of his CPAP, chronic left-sided chest pain, worsening shortness of breath who presents for routine follow-up of his coronary artery disease Notes indicate history of diabetes, esophageal spasm, bulging disc in his neck, GERD  In follow-up, he reports that he has had some difficulty with legs giving out when he stands up. He's had numerous episodes, sometimes walking around like he is drunk. Wife reports one episode where he was at a baseball game and started walking into people. He does have a blood pressure cuff but has not been using this for much at home Symptoms have been going on for several weeks. No significant changes in his medications  He did complete pulmonary rehabilitation where he was exercising 3 days per week. Now going to [employer]. Occasionally has weak legs at the gym. Weight continues to be an issue Denies significant shortness of breath or chest discomfort He's been taking Lasix 3 days per week  EKG on today's visit shows normal sinus rhythm with rate 67 bpm, no significant ST or T-wave changes Systolic pressure in the 80s today bilaterally sitting   Other past medical history He is on disability for his exercise intolerance Previously reported having lightheaded spells getting out of the car. Lasix dosing was decreased    lab work reviewed with him showing total cholesterol 144, LDL 80   No Known Allergies  Outpatient Encounter Prescriptions as of 06/20/2015  Medication Sig  . acetaminophen (TYLENOL) 325 MG tablet Take 650 mg by mouth every 6 (six) hours as needed.  Marland Kitchen albuterol (PROVENTIL HFA;VENTOLIN HFA) 108 (90 BASE)  MCG/ACT inhaler Inhale into the lungs every 6 (six) hours as needed for wheezing or shortness of breath.  . ALPRAZolam (XANAX) 0.5 MG tablet Take 1/2 tablet in the am with 1 tablet in the pm.  . aspirin 81 MG tablet Take 81 mg by mouth daily.  Marland Kitchen atorvastatin (LIPITOR) 40 MG tablet Take 40 mg by mouth daily.  Marland Kitchen buPROPion (WELLBUTRIN SR) 150 MG 12 hr tablet Take 150 mg by mouth 2 (two) times daily.  . clopidogrel (PLAVIX) 75 MG tablet Take 75 mg by mouth daily.  . Cyanocobalamin (B-12) 1000 MCG CAPS Take by mouth daily.  . fluticasone-salmeterol (ADVAIR HFA) 115-21 MCG/ACT inhaler Inhale 2 puffs into the lungs 2 (two) times daily.  . furosemide (LASIX) 20 MG tablet Take 1 tablet (20 mg total) by mouth every other day. (Patient taking differently: Take 20 mg by mouth. Monday, Wed. And Friday.)  . gabapentin (NEURONTIN) 300 MG capsule Take 300 mg by mouth 2 (two) times daily.   Marland Kitchen ibuprofen (ADVIL,MOTRIN) 200 MG tablet Take 200 mg by mouth every 6 (six) hours as needed.  . metFORMIN (GLUCOPHAGE) 850 MG tablet Take 850 mg by mouth daily with breakfast.   . metoprolol tartrate (LOPRESSOR) 25 MG tablet Take 12.5 mg by mouth 2 (two) times daily.  . nitroGLYCERIN (NITROSTAT) 0.4 MG SL tablet Place 0.4 mg under the tongue every 5 (five) minutes as needed for chest pain.  . potassium chloride SA (K-DUR,KLOR-CON) 20 MEQ tablet 20 mEq. Monday, Wed. And Friday.  . RABEprazole (ACIPHEX) 20 MG tablet Take 20 mg  by mouth 2 (two) times daily.   . ranitidine (ZANTAC) 150 MG capsule Take 150 mg by mouth 2 (two) times daily.  . ranolazine (RANEXA) 1000 MG SR tablet Take 1 tablet (1,000 mg total) by mouth 2 (two) times daily.  Marland Kitchen senna (SENOKOT) 8.6 MG tablet Take 1 tablet by mouth daily.  Marland Kitchen venlafaxine (EFFEXOR) 75 MG tablet Take 75 mg by mouth 2 (two) times daily.  . midodrine (PROAMATINE) 10 MG tablet Take 1 tablet (10 mg total) by mouth 3 (three) times daily as needed.   No facility-administered encounter  medications on file as of 06/20/2015.    Past Medical History  Diagnosis Date  . Type 2 diabetes mellitus   . Coronary artery disease   . GERD (gastroesophageal reflux disease)   . Hyperlipidemia   . Hypertension   . Atherosclerosis of coronary artery bypass graft with angina pectoris   . Sleep apnea   . Barrett's esophagus   . Anxiety   . H/O esophageal spasm   . Chronic constipation   . Colon polyp   . MI (myocardial infarction)     Past Surgical History  Procedure Laterality Date  . Tee with cardioversion    . Anal fissure repair    . Abscess cellulitis resection     . Colonoscopy    . Coronary artery bypass graft  2014    CABG x 2  . Cardiac catheterization  2011  . Coronary angioplasty with stent placement  2011    stent placement x 3 @ [facility]; Dr. Darrold Junker    Social History  reports that he has never smoked. He does not have any smokeless tobacco history on file. He reports that he does not drink alcohol or use illicit drugs.  Family History family history includes Hyperlipidemia in his father and mother; Hypertension in his father and mother.  Review of Systems  Constitutional: Negative.   HENT: Negative.   Eyes: Negative.   Respiratory: Negative.   Cardiovascular: Negative.   Gastrointestinal: Negative.   Musculoskeletal: Positive for gait problem.       Leg weakness, give out when he walks or stands  Skin: Negative.   Allergic/Immunologic: Negative.   Neurological: Positive for light-headedness.  Hematological: Negative.   Psychiatric/Behavioral: Negative.   All other systems reviewed and are negative.   BP 88/62 mmHg  Pulse 67  Ht  (1.778 m)  Wt 279 lb 12 oz (126.894 kg)  BMI 40.14 kg/m2 Blood pressure was confirmed by myself bilaterally Physical Exam  Constitutional: He is oriented to person, place, and time. He appears well-developed and well-nourished.  Obese  HENT:  Head: Normocephalic.  Nose: Nose normal.  Mouth/Throat: Oropharynx  is clear and moist.  Eyes: Conjunctivae are normal. Pupils are equal, round, and reactive to light.  Neck: Normal range of motion. Neck supple. No JVD present.  Cardiovascular: Normal rate, regular rhythm, S1 normal, S2 normal, normal heart sounds and intact distal pulses.  Exam reveals no gallop and no friction rub.   No murmur heard. Pulmonary/Chest: Effort normal and breath sounds normal. No respiratory distress. He has no wheezes. He has no rales. He exhibits no tenderness.  Abdominal: Soft. Bowel sounds are normal. He exhibits no distension. There is no tenderness.  Musculoskeletal: Normal range of motion. He exhibits no edema or tenderness.  Lymphadenopathy:    He has no cervical adenopathy.  Neurological: He is alert and oriented to person, place, and time. Coordination normal.  Skin: Skin is warm and  dry. No rash noted. No erythema.  Psychiatric: He has a normal mood and affect. His behavior is normal. Judgment and thought content normal.      Assessment and Plan   Nursing note and vitals reviewed.

## 2015-06-20 NOTE — Assessment & Plan Note (Signed)
Currently with no symptoms of angina. No further workup at this time. Continue current medication regimen. 

## 2015-08-21 ENCOUNTER — Ambulatory Visit (INDEPENDENT_AMBULATORY_CARE_PROVIDER_SITE_OTHER): Payer: PPO | Admitting: Cardiovascular Disease

## 2015-08-21 ENCOUNTER — Encounter: Payer: Self-pay | Admitting: Cardiovascular Disease

## 2015-08-21 VITALS — BP 119/84 | HR 74 | Ht 70.0 in | Wt 277.8 lb

## 2015-08-21 DIAGNOSIS — R079 Chest pain, unspecified: Secondary | ICD-10-CM

## 2015-08-21 DIAGNOSIS — I951 Orthostatic hypotension: Secondary | ICD-10-CM | POA: Diagnosis not present

## 2015-08-21 DIAGNOSIS — W19XXXS Unspecified fall, sequela: Secondary | ICD-10-CM

## 2015-08-21 DIAGNOSIS — R55 Syncope and collapse: Secondary | ICD-10-CM | POA: Diagnosis not present

## 2015-08-21 DIAGNOSIS — I2510 Atherosclerotic heart disease of native coronary artery without angina pectoris: Secondary | ICD-10-CM

## 2015-08-21 DIAGNOSIS — W19XXXA Unspecified fall, initial encounter: Secondary | ICD-10-CM | POA: Insufficient documentation

## 2015-08-21 DIAGNOSIS — R296 Repeated falls: Secondary | ICD-10-CM | POA: Insufficient documentation

## 2015-08-21 DIAGNOSIS — Z951 Presence of aortocoronary bypass graft: Secondary | ICD-10-CM

## 2015-08-21 DIAGNOSIS — E785 Hyperlipidemia, unspecified: Secondary | ICD-10-CM

## 2015-08-21 NOTE — Assessment & Plan Note (Signed)
Currently with no symptoms of angina. No further workup at this time. Continue current medication regimen. 

## 2015-08-21 NOTE — Progress Notes (Signed)
Patient ID: [name], male    DOB: 1956-08-10, 59 y.o.   MRN: [id number]  HPI Comments: [name] is a 59 year-old gentleman with obesity, coronary artery disease, stenting to his RCA and circumflex in 2010, bypass surgery March 2014 with vein graft to the OM, LIMA to the LAD, hyperlipidemia, obstructive sleep apnea with compliance of his CPAP, chronic left-sided chest pain, worsening shortness of breath who presents for routine follow-up of his coronary artery disease Notes indicate history of diabetes, esophageal spasm, bulging disc in his neck, GERD On his last clinic visit, [name] was hypotensive with systolic pressures in the 80s with severe orthostasis. Lasix dosing was decreased, started on midodrine. [name] currently takes midodrine 5 mg in the morning Weight/obesity a major issue EKG on today's visit  In follow-up today, [name] has had 3 more falls since his last clinic visit. One episode [name] was sitting for 45 minutes at a concert, stood up to walk and stutter stepped to the left several steps before falling over  Additional episode [name] walked down 2 steps on a deck, walk 3 steps again stutters steps to the left 3 steps before falling down to the ground Reports that his legs feel like rubber   [name] is working out at [employer] walking 30-45 minutes at a time, no balance issues on the treadmill. Reports [name] is taking Lasix 1 to twice per week  Orthostatics done in the office shows systolic pressure supine 133/86 with heart rate 69 Sitting 107/76 heart rate 75 Standing 98/63 with heart rate 77 Repeat standing 104/73 with heart rate 91  EKG on today's visit shows normal sinus rhythm with rate 69 bpm, no significant ST or T-wave changes  Other past medical history Previous episode of falling down or falling into people at a baseball game  [name] did complete pulmonary rehabilitation where [name] was exercising 3 days per week.  Denies significant shortness of breath or chest discomfort  [name]  is on disability for his exercise intolerance Previously reported having lightheaded spells getting out of the car. Lasix dosing was decreased    lab work reviewed with him showing total cholesterol 144, LDL 80   No Known Allergies  Outpatient Encounter Prescriptions as of 08/21/2015  Medication Sig  . acetaminophen (TYLENOL) 325 MG tablet Take 650 mg by mouth every 6 (six) hours as needed.  Marland Kitchen albuterol (PROVENTIL HFA;VENTOLIN HFA) 108 (90 BASE) MCG/ACT inhaler Inhale into the lungs every 6 (six) hours as needed for wheezing or shortness of breath.  . ALPRAZolam (XANAX) 0.5 MG tablet Take 1/2 tablet in the am with 1 tablet in the pm.  . aspirin 81 MG tablet Take 81 mg by mouth daily.  Marland Kitchen atorvastatin (LIPITOR) 40 MG tablet Take 40 mg by mouth daily.  Marland Kitchen buPROPion (WELLBUTRIN SR) 150 MG 12 hr tablet Take 150 mg by mouth 2 (two) times daily.  . clopidogrel (PLAVIX) 75 MG tablet Take 75 mg by mouth daily.  . Cyanocobalamin (B-12) 1000 MCG CAPS Take by mouth daily.  . fluticasone-salmeterol (ADVAIR HFA) 115-21 MCG/ACT inhaler Inhale 2 puffs into the lungs 2 (two) times daily.  . furosemide (LASIX) 20 MG tablet Take 1 tablet (20 mg total) by mouth every other day. (Patient taking differently: Take 20 mg by mouth. Only Monday And Thursday.)  . gabapentin (NEURONTIN) 300 MG capsule Take 300 mg by mouth 2 (two) times daily.   Marland Kitchen ibuprofen (ADVIL,MOTRIN) 200 MG tablet Take 200 mg by mouth every 6 (  six) hours as needed.  . metFORMIN (GLUCOPHAGE) 850 MG tablet Take 850 mg by mouth daily with breakfast.   . metoprolol tartrate (LOPRESSOR) 25 MG tablet Take 12.5 mg by mouth 2 (two) times daily.  . midodrine (PROAMATINE) 10 MG tablet Take 0.5-1 tablets (5-10 mg total) by mouth as needed.  . nitroGLYCERIN (NITROSTAT) 0.4 MG SL tablet Place 0.4 mg under the tongue every 5 (five) minutes as needed for chest pain.  . potassium chloride SA (K-DUR,KLOR-CON) 20 MEQ tablet 20 mEq. Only Mon. And Thursday.  .  RABEprazole (ACIPHEX) 20 MG tablet Take 20 mg by mouth 2 (two) times daily.   . ranitidine (ZANTAC) 150 MG capsule Take 150 mg by mouth 2 (two) times daily.  . ranolazine (RANEXA) 1000 MG SR tablet Take 1 tablet (1,000 mg total) by mouth 2 (two) times daily.  Marland Kitchen senna (SENOKOT) 8.6 MG tablet Take 1 tablet by mouth daily.  Marland Kitchen venlafaxine (EFFEXOR) 75 MG tablet Take 75 mg by mouth 2 (two) times daily.  . [DISCONTINUED] midodrine (PROAMATINE) 5 MG tablet Take 5 mg by mouth as needed.  . [DISCONTINUED] midodrine (PROAMATINE) 10 MG tablet Take 1 tablet (10 mg total) by mouth 3 (three) times daily as needed. (Patient not taking: Reported on 08/21/2015)   No facility-administered encounter medications on file as of 08/21/2015.    Past Medical History  Diagnosis Date  . Type 2 diabetes mellitus   . Coronary artery disease   . GERD (gastroesophageal reflux disease)   . Hyperlipidemia   . Hypertension   . Atherosclerosis of coronary artery bypass graft with angina pectoris   . Sleep apnea   . Barrett's esophagus   . Anxiety   . H/O esophageal spasm   . Chronic constipation   . Colon polyp   . MI (myocardial infarction)     Past Surgical History  Procedure Laterality Date  . Tee with cardioversion    . Anal fissure repair    . Abscess cellulitis resection     . Colonoscopy    . Coronary artery bypass graft  2014    CABG x 2  . Cardiac catheterization  2011  . Coronary angioplasty with stent placement  2011    stent placement x 3 @ [facility]; Dr. Darrold Junker    Social History  reports that [name] has never smoked. [name] does not have any smokeless tobacco history on file. [name] reports that [name] does not drink alcohol or use illicit drugs.  Family History family history includes Hyperlipidemia in his father and mother; Hypertension in his father and mother.  Review of Systems  Constitutional: Negative.   HENT: Negative.   Eyes: Negative.   Respiratory: Negative.   Cardiovascular: Negative.    Gastrointestinal: Negative.   Musculoskeletal: Positive for gait problem.       Leg weakness, give out when [name] walks or stands  Skin: Negative.   Allergic/Immunologic: Negative.   Neurological: Positive for light-headedness.  Hematological: Negative.   Psychiatric/Behavioral: Negative.   All other systems reviewed and are negative.   BP 119/84 mmHg  Pulse 74  Ht  (1.778 m)  Wt 277 lb 12 oz (125.987 kg)  BMI 39.85 kg/m2  Physical Exam  Constitutional: [name] is oriented to person, place, and time. [name] appears well-developed and well-nourished.  Obese  HENT:  Head: Normocephalic.  Nose: Nose normal.  Mouth/Throat: Oropharynx is clear and moist.  Eyes: Conjunctivae are normal. Pupils are equal, round, and reactive to light.  Neck: Normal  range of motion. Neck supple. No JVD present.  Cardiovascular: Normal rate, regular rhythm, S1 normal, S2 normal, normal heart sounds and intact distal pulses.  Exam reveals no gallop and no friction rub.   No murmur heard. Pulmonary/Chest: Effort normal and breath sounds normal. No respiratory distress. [name] has no wheezes. [name] has no rales. [name] exhibits no tenderness.  Abdominal: Soft. Bowel sounds are normal. [name] exhibits no distension. There is no tenderness.  Musculoskeletal: Normal range of motion. [name] exhibits no edema or tenderness.  Lymphadenopathy:    [name] has no cervical adenopathy.  Neurological: [name] is alert and oriented to person, place, and time. Coordination normal.  Skin: Skin is warm and dry. No rash noted. No erythema.  Psychiatric: [name] has a normal mood and affect. His behavior is normal. Judgment and thought content normal.      Assessment and Plan   Nursing note and vitals reviewed.

## 2015-08-21 NOTE — Patient Instructions (Signed)
For dizziness, Please increase the midodrine to 10 mg in the AM and 5 mg at noon or 2 pm  Please call us if you have new issues that need to be addressed before your next appt.  Your physician wants you to follow-up in: 6 months.  You will receive a reminder letter in the mail two months in advance. If you don't receive a letter, please call our office to schedule the follow-up appointment.

## 2015-08-21 NOTE — Assessment & Plan Note (Signed)
Cholesterol is at goal on the current lipid regimen. No changes to the medications were made. Followed by primary care

## 2015-08-21 NOTE — Assessment & Plan Note (Signed)
Over 30 point drop in blood pressure was supine to standing position Reports he drinks plenty of fluids. Does not like to wear compression hose. Recommended he increase his midodrine up to 10 mg in the morning, 5 mg after lunch Monitor blood pressure when symptomatic

## 2015-08-21 NOTE — Assessment & Plan Note (Signed)
Frequent falls, unable to exclude orthostasis though need to consider gait instability. Recommended he work with a Psychologist, educational on balance

## 2015-09-29 ENCOUNTER — Telehealth: Payer: Self-pay

## 2015-09-29 DIAGNOSIS — R55 Syncope and collapse: Secondary | ICD-10-CM

## 2015-09-29 MED ORDER — FLUDROCORTISONE ACETATE 0.1 MG PO TABS: 0.1000 mg | ORAL_TABLET | ORAL | Status: DC

## 2015-09-29 NOTE — Telephone Encounter (Signed)
Spoke w/ pt.  He reports that on Friday evening, he was sitting on his deck w/ his wife, he stood up and fell over. He hit his face, which left some bruising and his left ring finger is swollen, purple and painful. He has not seen anyone about his finger, as he states "they'll just put a brace on it". Pt has a BP monitor but does not check BP very often.  Pt has compression hose, but does not like to wear them.  Spoke w/ Dr. Mariah MillingGollan who recommends the following: 1.  Start florinef 0.1 mg every other day 2.  Monitor BP, orthostatic if possible, change positions slowly, hydrate 3.  Reconsider wearing compression hose, esp since weather is cooling off 4.  Wear back brace (ab binder) to increase venous pressure 5.  30 day monitor 6.  Carotid u/s 7.  6 week f/u 8.  If sx continue, may need referral to neurology  Pt is agreeable to all of Dr. Windell HummingbirdGollan's recommendations and is appreciative of the time that Dr. Mariah MillingGollan took, esp since he is not in the office today.  Pt transferred to front desk to schedule carotid u/s & f/u appt.

## 2015-09-29 NOTE — Telephone Encounter (Signed)
Pt states he had "another issue" of passing out. States this occurred late Friday afternoon, after 5. Pt was at home, he was on his deck, he was sitting in a chair, stood up and he fell to the right, hit his jaw on a wooden chair, bruised his right jaw, cut his right jaw, and broke his left finger( he thinks). States his hand is blue up into his palm. He wanted to let us know what happened. Please call.

## 2015-09-30 ENCOUNTER — Ambulatory Visit (INDEPENDENT_AMBULATORY_CARE_PROVIDER_SITE_OTHER): Payer: PPO

## 2015-09-30 DIAGNOSIS — R55 Syncope and collapse: Secondary | ICD-10-CM | POA: Diagnosis not present

## 2015-10-01 ENCOUNTER — Encounter (INDEPENDENT_AMBULATORY_CARE_PROVIDER_SITE_OTHER): Payer: PPO

## 2015-10-01 DIAGNOSIS — R55 Syncope and collapse: Secondary | ICD-10-CM | POA: Diagnosis not present

## 2015-10-19 ENCOUNTER — Other Ambulatory Visit: Payer: Self-pay | Admitting: Cardiovascular Disease

## 2015-10-20 ENCOUNTER — Telehealth: Payer: Self-pay

## 2015-10-20 NOTE — Telephone Encounter (Signed)
Received call from [employer] that pt pushed button indicating that he passed out.  [employer] was unable to get ahold of pt.  They are unsure if push was accidental, as pt's monitor showed NSR w/ rate of 83.  Spoke w/ pt.  Reports that he was outside mowing his yard. He got off of the riding mower to empty bag and passed out as soon as he stepped off of the mower.  Reports that he is SOB at the moment, having some mild chest discomfort. He is resting at the moment and trying to calm down after the excitement of waking up on the ground.  Advised him that I will print out report for Dr. Windell HummingbirdGollan's review and call him back w/ his recommendation.  He is appreciative of the call and will call back if his sx do not resolve.

## 2015-10-22 NOTE — Telephone Encounter (Signed)
Likely secondary to orthostasis Would find out the time of his passout spell Would consider increasing midodrine up to 10 mg first thing in the morning, with glass of water Then 10 mg at lunch time Would continue to monitor blood pressure when he stands up to look for drop May benefit from compression hose, abnormal binder/back brace

## 2015-10-23 NOTE — Telephone Encounter (Signed)
Spoke w/ pt.  Advised him of Dr. Windell HummingbirdGollan's recommendation. He is agreeable to increasing midodrine, has been wearing his TED hose, but is hesitant to wear abdominal binder. He will continue to monitor BP and call back w/ any further questions or concerns.

## 2015-11-10 ENCOUNTER — Ambulatory Visit (INDEPENDENT_AMBULATORY_CARE_PROVIDER_SITE_OTHER): Payer: PPO | Admitting: Cardiovascular Disease

## 2015-11-10 ENCOUNTER — Encounter: Payer: Self-pay | Admitting: Cardiovascular Disease

## 2015-11-10 VITALS — BP 129/84 | HR 65 | Temp 97.6°F | Ht 70.0 in | Wt 277.0 lb

## 2015-11-10 DIAGNOSIS — R0789 Other chest pain: Secondary | ICD-10-CM

## 2015-11-10 DIAGNOSIS — I2510 Atherosclerotic heart disease of native coronary artery without angina pectoris: Secondary | ICD-10-CM | POA: Diagnosis not present

## 2015-11-10 DIAGNOSIS — J069 Acute upper respiratory infection, unspecified: Secondary | ICD-10-CM | POA: Insufficient documentation

## 2015-11-10 DIAGNOSIS — R0602 Shortness of breath: Secondary | ICD-10-CM | POA: Diagnosis not present

## 2015-11-10 DIAGNOSIS — I951 Orthostatic hypotension: Secondary | ICD-10-CM | POA: Diagnosis not present

## 2015-11-10 DIAGNOSIS — E1159 Type 2 diabetes mellitus with other circulatory complications: Secondary | ICD-10-CM

## 2015-11-10 DIAGNOSIS — E785 Hyperlipidemia, unspecified: Secondary | ICD-10-CM

## 2015-11-10 DIAGNOSIS — B9789 Other viral agents as the cause of diseases classified elsewhere: Secondary | ICD-10-CM

## 2015-11-10 MED ORDER — MIDODRINE HCL 10 MG PO TABS: 10.0000 mg | ORAL_TABLET | Freq: Three times a day (TID) | ORAL | Status: DC

## 2015-11-10 NOTE — Assessment & Plan Note (Addendum)
Long discussion concerning other medication options. We have recommended he increase the midodrine up to 15 mg in the morning, 15 mg after lunch May need additional dosing before bed for orthostasis Unclear if he is having unusual urine volume at nighttime, recommended he monitor this Recommended he stop his Lasix, stay on Florinef every other day

## 2015-11-10 NOTE — Progress Notes (Signed)
Patient ID: [name], male    DOB: 12-08-1955, 59 y.o.   MRN: [id number]  HPI Comments: [name] is a 59 year-old gentleman with obesity, coronary artery disease, stenting to his RCA and circumflex in 2010, bypass surgery March 2014 with vein graft to the OM, LIMA to the LAD, hyperlipidemia, obstructive sleep apnea with compliance of his CPAP, chronic left-sided chest pain, worsening shortness of breath who presents for routine follow-up of his coronary artery disease  Notes indicate history of diabetes, esophageal spasm, bulging disc in his neck, GERD Episodes with chronic orthostasis, very symptomatic Etiology of his orthostasis is still unclear  Over the past several clinic visits, we have been slowly titrating midodrine and Florinef upwards for symptom relief Reports October 31 2015 having episode of near syncope, got up from a recliner, felt very lightheaded, recovered without syncope One episode approximately 10 days prior to that, getting up from a lawnmower to a upright position, had near-syncope syncope At that time we increased his midodrine up to 10 mg He takes  Midodrine 10 mg in the morning, also after lunch, none in the evening He continues to have orthostasis overnight when getting up to urinate Tolerating Florinef 0.1 mg every other day Denies any significant leg edema He does feel like the medications are helping, having less episodes Continues to take Lasix 1 day per week His orthostasis numbers/blood pressure numbers at home continue to showdrops in blood pressure with standing sometimes 80 systolic  Weight/obesity a major issue  on today's visit  EKG on today's visit shows normal sinus rhythm with rate 65 bpm, no significant ST or T-wave changes  Other past medical history He is working out at [employer] walking 30-45 minutes at a time, no balance issues on the treadmill. Reports he is taking Lasix 1 to twice per week Orthostatics done in the office  shows systolic pressure supine 133/86 with heart rate 69 Sitting 107/76 heart rate 75 Standing 98/63 with heart rate 77 Repeat standing 104/73 with heart rate 91  Previous episode of falling down or falling into people at a baseball game  He did complete pulmonary rehabilitation where he was exercising 3 days per week.  Denies significant shortness of breath or chest discomfort  He is on disability for his exercise intolerance Previously reported having lightheaded spells getting out of the car. Lasix dosing was decreased    lab work reviewed with him showing total cholesterol 144, LDL 80   No Known Allergies  Outpatient Encounter Prescriptions as of 11/10/2015  Medication Sig  . acetaminophen (TYLENOL) 325 MG tablet Take 650 mg by mouth every 6 (six) hours as needed.  Marland Kitchen albuterol (PROVENTIL HFA;VENTOLIN HFA) 108 (90 BASE) MCG/ACT inhaler Inhale into the lungs every 6 (six) hours as needed for wheezing or shortness of breath.  . ALPRAZolam (XANAX) 0.5 MG tablet Reported on 11/10/2015  . aspirin 81 MG tablet Take 81 mg by mouth daily.  Marland Kitchen atorvastatin (LIPITOR) 40 MG tablet Take 40 mg by mouth daily.  Marland Kitchen buPROPion (WELLBUTRIN SR) 150 MG 12 hr tablet Take 150 mg by mouth 2 (two) times daily.  . clopidogrel (PLAVIX) 75 MG tablet Take 75 mg by mouth daily.  . Cyanocobalamin (B-12) 1000 MCG CAPS Take by mouth daily.  . fludrocortisone (FLORINEF) 0.1 MG tablet Take 1 tablet (0.1 mg total) by mouth every other day.  . fluticasone-salmeterol (ADVAIR HFA) 115-21 MCG/ACT inhaler Inhale 2 puffs into the lungs 2 (two) times daily.  Marland Kitchen  furosemide (LASIX) 20 MG tablet Take 1 tablet (20 mg total) by mouth every other day. (Patient taking differently: Take 20 mg by mouth. Wednesday)  . gabapentin (NEURONTIN) 300 MG capsule Take 300 mg by mouth 2 (two) times daily.   Marland Kitchen ibuprofen (ADVIL,MOTRIN) 200 MG tablet Take 200 mg by mouth every 6 (six) hours as needed.  . metFORMIN (GLUCOPHAGE) 850 MG tablet Take  850 mg by mouth daily with breakfast.   . metoprolol tartrate (LOPRESSOR) 25 MG tablet Take 12.5 mg by mouth 2 (two) times daily.  . midodrine (PROAMATINE) 10 MG tablet Take 1 tablet (10 mg total) by mouth 4 (four) times daily - after meals and at bedtime.  . nitroGLYCERIN (NITROSTAT) 0.4 MG SL tablet Place 0.4 mg under the tongue every 5 (five) minutes as needed for chest pain.  . potassium chloride SA (K-DUR,KLOR-CON) 20 MEQ tablet 20 mEq. Wednesday  . RABEprazole (ACIPHEX) 20 MG tablet Take 20 mg by mouth daily.   Marland Kitchen RANEXA 1000 MG SR tablet TAKE 1 TABLET (1,000 MG TOTAL) BY MOUTH 2 (TWO) TIMES DAILY.  . ranitidine (ZANTAC) 150 MG capsule Take 150 mg by mouth 2 (two) times daily.  Marland Kitchen senna (SENOKOT) 8.6 MG tablet Take 1 tablet by mouth daily.  Marland Kitchen venlafaxine (EFFEXOR) 75 MG tablet Take 75 mg by mouth 2 (two) times daily.  . [DISCONTINUED] midodrine (PROAMATINE) 10 MG tablet Take 0.5-1 tablets (5-10 mg total) by mouth as needed.   No facility-administered encounter medications on file as of 11/10/2015.    Past Medical History  Diagnosis Date  . Type 2 diabetes mellitus (HCC)   . Coronary artery disease   . GERD (gastroesophageal reflux disease)   . Hyperlipidemia   . Hypertension   . Atherosclerosis of coronary artery bypass graft with angina pectoris (HCC)   . Sleep apnea   . Barrett's esophagus   . Anxiety   . H/O esophageal spasm   . Chronic constipation   . Colon polyp   . MI (myocardial infarction) [facility])     Past Surgical History  Procedure Laterality Date  . Tee with cardioversion    . Anal fissure repair    . Abscess cellulitis resection     . Colonoscopy    . Coronary artery bypass graft  2014    CABG x 2  . Cardiac catheterization  2011  . Coronary angioplasty with stent placement  2011    stent placement x 3 @ [facility]; Dr. Darrold Junker    Social History  reports that he has never smoked. He does not have any smokeless tobacco history on file. He reports that he does  not drink alcohol or use illicit drugs.  Family History family history includes Hyperlipidemia in his father and mother; Hypertension in his father and mother.  Review of Systems  Constitutional: Negative.   Respiratory: Negative.   Cardiovascular: Negative.   Gastrointestinal: Negative.   Musculoskeletal: Positive for gait problem.       Leg weakness, give out when he walks or stands  Skin: Negative.   Allergic/Immunologic: Negative.   Neurological: Positive for syncope and light-headedness.  Hematological: Negative.   Psychiatric/Behavioral: Negative.   All other systems reviewed and are negative.   BP 129/84 mmHg  Pulse 65  Temp(Src) 97.6 F (36.4 C)  Ht  (1.778 m)  Wt 277 lb (125.646 kg)  BMI 39.75 kg/m2  Physical Exam  Constitutional: He is oriented to person, place, and time. He appears well-developed and well-nourished.  Obese  HENT:  Head: Normocephalic.  Nose: Nose normal.  Mouth/Throat: Oropharynx is clear and moist.  Eyes: Conjunctivae are normal. Pupils are equal, round, and reactive to light.  Neck: Normal range of motion. Neck supple. No JVD present.  Cardiovascular: Normal rate, regular rhythm, S1 normal, S2 normal, normal heart sounds and intact distal pulses.  Exam reveals no gallop and no friction rub.   No murmur heard. Pulmonary/Chest: Effort normal and breath sounds normal. No respiratory distress. He has no wheezes. He has no rales. He exhibits no tenderness.  Abdominal: Soft. Bowel sounds are normal. He exhibits no distension. There is no tenderness.  Musculoskeletal: Normal range of motion. He exhibits no edema or tenderness.  Lymphadenopathy:    He has no cervical adenopathy.  Neurological: He is alert and oriented to person, place, and time. Coordination normal.  Skin: Skin is warm and dry. No rash noted. No erythema.  Psychiatric: He has a normal mood and affect. His behavior is normal. Judgment and thought content normal.       Assessment and Plan   Nursing note and vitals reviewed.

## 2015-11-10 NOTE — Assessment & Plan Note (Signed)
We have encouraged continued exercise, careful diet management in an effort to lose weight. 

## 2015-11-10 NOTE — Assessment & Plan Note (Signed)
Currently with symptoms of viral infection, wearing a mask, sore throat, cough Recommended Tylenol, naproxen for symptom relief

## 2015-11-10 NOTE — Assessment & Plan Note (Signed)
Encouraged him to stay on his Lipitor daily goal LDL less than 70

## 2015-11-10 NOTE — Assessment & Plan Note (Signed)
Currently with no symptoms of angina. No further workup at this time. Continue current medication regimen. 

## 2015-11-10 NOTE — Assessment & Plan Note (Signed)
Recommended alcohol Viagra diet for weight loss

## 2015-11-10 NOTE — Patient Instructions (Addendum)
You are doing well.  Please stop the lasix and potassium  Please increase the midodrine up to 15 mg two to times a day  Please call us if you have new issues that need to be addressed before your next appt.  Your physician wants you to follow-up in: 6 months.  You will receive a reminder letter in the mail two months in advance. If you don't receive a letter, please call our office to schedule the follow-up appointment.

## 2015-12-06 ENCOUNTER — Emergency Department
Admission: EM | Admit: 2015-12-06 | Discharge: 2015-12-06 | Disposition: A | Discharge status: Home / Self Care | Payer: PPO | Attending: Emergency Medicine | Admitting: Emergency Medicine

## 2015-12-06 ENCOUNTER — Emergency Department: Payer: PPO

## 2015-12-06 ENCOUNTER — Encounter: Payer: Self-pay | Admitting: Emergency Medicine

## 2015-12-06 DIAGNOSIS — Z7982 Long term (current) use of aspirin: Secondary | ICD-10-CM | POA: Diagnosis not present

## 2015-12-06 DIAGNOSIS — Y998 Other external cause status: Secondary | ICD-10-CM | POA: Diagnosis not present

## 2015-12-06 DIAGNOSIS — Y9289 Other specified places as the place of occurrence of the external cause: Secondary | ICD-10-CM | POA: Diagnosis not present

## 2015-12-06 DIAGNOSIS — Z7984 Long term (current) use of oral hypoglycemic drugs: Secondary | ICD-10-CM | POA: Diagnosis not present

## 2015-12-06 DIAGNOSIS — Z7951 Long term (current) use of inhaled steroids: Secondary | ICD-10-CM | POA: Insufficient documentation

## 2015-12-06 DIAGNOSIS — I10 Essential (primary) hypertension: Secondary | ICD-10-CM | POA: Diagnosis not present

## 2015-12-06 DIAGNOSIS — E1159 Type 2 diabetes mellitus with other circulatory complications: Secondary | ICD-10-CM | POA: Diagnosis not present

## 2015-12-06 DIAGNOSIS — W07XXXA Fall from chair, initial encounter: Secondary | ICD-10-CM | POA: Diagnosis not present

## 2015-12-06 DIAGNOSIS — Y9389 Activity, other specified: Secondary | ICD-10-CM | POA: Insufficient documentation

## 2015-12-06 DIAGNOSIS — Z79899 Other long term (current) drug therapy: Secondary | ICD-10-CM | POA: Insufficient documentation

## 2015-12-06 DIAGNOSIS — S81812A Laceration without foreign body, left lower leg, initial encounter: Secondary | ICD-10-CM | POA: Diagnosis not present

## 2015-12-06 MED ORDER — OXYCODONE HCL 5 MG PO TABS: 5.0000 mg | ORAL_TABLET | Freq: Four times a day (QID) | ORAL | Status: DC | PRN

## 2015-12-06 MED ORDER — MORPHINE SULFATE (PF) 4 MG/ML IV SOLN
4.0000 mg | Freq: Once | INTRAVENOUS | Status: AC
  Administered 2015-12-06: 4 mg via INTRAMUSCULAR
  Filled 2015-12-06: qty 1

## 2015-12-06 MED ORDER — LIDOCAINE-EPINEPHRINE (PF) 1 %-1:200000 IJ SOLN
30.0000 mL | Freq: Once | INTRAMUSCULAR | Status: AC
  Administered 2015-12-06: 30 mL via INTRADERMAL
  Filled 2015-12-06: qty 30

## 2015-12-06 NOTE — Discharge Instructions (Signed)
You were prescribed a medication that is potentially sedating. Do not drink alcohol, drive or participate in any other potentially dangerous activities while taking this medication as it may make you sleepy. Do not take this medication with any other sedating medications, either prescription or over-the-counter. If you were prescribed Percocet or Vicodin, do not take these with acetaminophen (Tylenol) as it is already contained within these medications.   Opioid pain medications (or "narcotics") can be habit forming.  Use it as little as possible to achieve adequate pain control.  Do not use or use it with extreme caution if you have a history of opiate abuse or dependence.  If you are on a pain contract with your primary care doctor or a pain specialist, be sure to let them know you were prescribed this medication today from the [facility].  This medication is intended for your use only - do not give any to anyone else and keep it in a secure place where nobody else, especially children and pets, have access to it.  It will also cause or worsen constipation, so you may want to consider taking an over-the-counter stool softener while you are taking this medication.   Wound Care Taking care of your wound properly can help to prevent pain and infection. It can also help your wound to heal more quickly.  HOW TO CARE FOR YOUR WOUND  Take or apply over-the-counter and prescription medicines only as told by your health care provider.  If you were prescribed antibiotic medicine, take or apply it as told by your health care provider. Do not stop using the antibiotic even if your condition improves.  Clean the wound each day or as told by your health care provider.  Wash the wound with mild soap and water.  Rinse the wound with water to remove all soap.  Pat the wound dry with a clean towel. Do not rub it.  There are many different ways to close and cover a wound. For  example, a wound can be covered with stitches (sutures), skin glue, or adhesive strips. Follow instructions from your health care provider about:  How to take care of your wound.  When and how you should change your bandage (dressing).  When you should remove your dressing.  Removing whatever was used to close your wound.  Check your wound every day for signs of infection. Watch for:  Redness, swelling, or pain.  Fluid, blood, or pus.  Keep the dressing dry until your health care provider says it can be removed. Do not take baths, swim, use a hot tub, or do anything that would put your wound underwater until your health care provider approves.  Raise (elevate) the injured area above the level of your heart while you are sitting or lying down.  Do not scratch or pick at the wound.  Keep all follow-up visits as told by your health care provider. This is important. SEEK MEDICAL CARE IF:  You received a tetanus shot and you have swelling, severe pain, redness, or bleeding at the injection site.  You have a fever.  Your pain is not controlled with medicine.  You have increased redness, swelling, or pain at the site of your wound.  You have fluid, blood, or pus coming from your wound.  You notice a bad smell coming from your wound or your dressing. SEEK IMMEDIATE MEDICAL CARE IF:  You have a red streak going away from your wound.   This information is  not intended to replace advice given to you by your health care provider. Make sure you discuss any questions you have with your health care provider.   Document Released: 08/17/2008 Document Revised: 03/25/2015 Document Reviewed: 11/04/2014 Elsevier Interactive Patient Education 2016 [employer] Sutured Wound Care Sutures are stitches that can be used to close wounds. Taking care of your wound properly can help to prevent pain and infection. It can also help your wound to heal more quickly. HOW TO CARE FOR YOUR SUTURED  WOUND Wound Care  Keep the wound clean and dry.  If you were given a bandage (dressing), you should change it at least once per day or as directed by your health care provider. You should also change it if it becomes wet or dirty.  Keep the wound completely dry for the first 24 hours or as directed by your health care provider. After that time, you may shower or bathe. However, make sure that the wound is not soaked in water until the sutures have been removed.  Clean the wound one time each day or as directed by your health care provider.  Wash the wound with soap and water.  Rinse the wound with water to remove all soap.  Pat the wound dry with a clean towel. Do not rub the wound.  Aftercleaning the wound, apply a thin layer of antibioticointment as directed by your health care provider. This will help to prevent infection and keep the dressing from sticking to the wound.  Have the sutures removed as directed by your health care provider. General Instructions  Take or apply medicines only as directed by your health care provider.  To help prevent scarring, make sure to cover your wound with sunscreen whenever you are outside after the sutures are removed and the wound is healed. Make sure to wear a sunscreen of at least 30 SPF.  If you were prescribed an antibiotic medicine or ointment, finish all of it even if you start to feel better.  Do not scratch or pick at the wound.  Keep all follow-up visits as directed by your health care provider. This is important.  Check your wound every day for signs of infection. Watch for:   Redness, swelling, or pain.  Fluid, blood, or pus.  Raise (elevate) the injured area above the level of your heart while you are sitting or lying down, if possible.  Avoid stretching your wound.  Drink enough fluids to keep your urine clear or pale yellow. SEEK MEDICAL CARE IF:  You received a tetanus shot and you have swelling, severe pain,  redness, or bleeding at the injection site.  You have a fever.  A wound that was closed breaks open.  You notice a bad smell coming from the wound.  You notice something coming out of the wound, such as wood or glass.  Your pain is not controlled with medicine.  You have increased redness, swelling, or pain at the site of your wound.  You have fluid, blood, or pus coming from your wound.  You notice a change in the color of your skin near your wound.  You need to change the dressing frequently due to fluid, blood, or pus draining from the wound.  You develop a new rash.  You develop numbness around the wound. SEEK IMMEDIATE MEDICAL CARE IF:  You develop severe swelling around the injury site.  Your pain suddenly increases and is severe.  You develop painful lumps near the wound or on skin  that is anywhere on your body.  You have a red streak going away from your wound.  The wound is on your hand or foot and you cannot properly move a finger or toe.  The wound is on your hand or foot and you notice that your fingers or toes look pale or bluish.   This information is not intended to replace advice given to you by your health care provider. Make sure you discuss any questions you have with your health care provider.   Document Released: 12/16/2004 Document Revised: 03/25/2015 Document Reviewed: 06/20/2013 Elsevier Interactive Patient Education [employer]

## 2015-12-06 NOTE — ED Provider Notes (Signed)
[facility] Emergency Department Provider Note  ____________________________________________  Time seen: 2:40 PM  I have reviewed the triage vital signs and the nursing notes.   HISTORY  Chief Complaint Laceration    HPI [name] is a 60 y.o. male who complains of a laceration to the left lower leg. He was stepping up onto a chair to change a light bulb when he fell down onto the chair on his left shin. He is able to bear weight on the leg and doesn't think he broke it but does have a large laceration to the anterior left shin that was bleeding. He does take Plavix but no other anticoagulants. He did not hit his head and has no other injuries. No other complaints.     Past Medical History  Diagnosis Date  . Type 2 diabetes mellitus (HCC)   . Coronary artery disease   . GERD (gastroesophageal reflux disease)   . Hyperlipidemia   . Hypertension   . Atherosclerosis of coronary artery bypass graft with angina pectoris (HCC)   . Sleep apnea   . Barrett's esophagus   . Anxiety   . H/O esophageal spasm   . Chronic constipation   . Colon polyp   . MI (myocardial infarction) [facility](HCC)      Patient Active Problem List   Diagnosis Date Noted  . Viral URI with cough 11/10/2015  . Falls 08/21/2015  . Coronary artery disease 08/21/2015  . Sleep apnea 03/26/2015  . Chest discomfort 01/28/2015  . Shortness of breath 01/28/2015  . S/P CABG x 2 01/28/2015  . Hyperlipidemia 01/28/2015  . Type 2 diabetes mellitus with other circulatory complications (HCC) 01/28/2015  . Morbid obesity (HCC) 01/28/2015  . Orthostatic hypotension 01/28/2015     Past Surgical History  Procedure Laterality Date  . Tee with cardioversion    . Anal fissure repair    . Abscess cellulitis resection     . Colonoscopy    . Coronary artery bypass graft  2014    CABG x 2  . Cardiac catheterization  2011  . Coronary angioplasty with stent placement  2011    stent placement x  3 @ [facility]; Dr. Darrold JunkerParaschos     Current Outpatient Rx  Name  Route  Sig  Dispense  Refill  . acetaminophen (TYLENOL) 325 MG tablet   Oral   Take 650 mg by mouth every 6 (six) hours as needed.         Marland Kitchen. albuterol (PROVENTIL HFA;VENTOLIN HFA) 108 (90 BASE) MCG/ACT inhaler   Inhalation   Inhale into the lungs every 6 (six) hours as needed for wheezing or shortness of breath.         . ALPRAZolam (XANAX) 0.5 MG tablet      Reported on 11/10/2015         . aspirin 81 MG tablet   Oral   Take 81 mg by mouth daily.         Marland Kitchen. atorvastatin (LIPITOR) 40 MG tablet   Oral   Take 40 mg by mouth daily.         Marland Kitchen. buPROPion (WELLBUTRIN SR) 150 MG 12 hr tablet   Oral   Take 150 mg by mouth 2 (two) times daily.         . clopidogrel (PLAVIX) 75 MG tablet   Oral   Take 75 mg by mouth daily.         . Cyanocobalamin (B-12) 1000 MCG CAPS  Oral   Take by mouth daily.         . fludrocortisone (FLORINEF) 0.1 MG tablet   Oral   Take 1 tablet (0.1 mg total) by mouth every other day.   30 tablet   6   . fluticasone-salmeterol (ADVAIR HFA) 115-21 MCG/ACT inhaler   Inhalation   Inhale 2 puffs into the lungs 2 (two) times daily.         . furosemide (LASIX) 20 MG tablet   Oral   Take 1 tablet (20 mg total) by mouth every other day. Patient taking differently: Take 20 mg by mouth. Wednesday   30 tablet   3   . gabapentin (NEURONTIN) 300 MG capsule   Oral   Take 300 mg by mouth 2 (two) times daily.          Marland Kitchen ibuprofen (ADVIL,MOTRIN) 200 MG tablet   Oral   Take 200 mg by mouth every 6 (six) hours as needed.         . metFORMIN (GLUCOPHAGE) 850 MG tablet   Oral   Take 850 mg by mouth daily with breakfast.          . metoprolol tartrate (LOPRESSOR) 25 MG tablet   Oral   Take 12.5 mg by mouth 2 (two) times daily.         . midodrine (PROAMATINE) 10 MG tablet   Oral   Take 1 tablet (10 mg total) by mouth 4 (four) times daily - after meals and at bedtime.    120 tablet   6   . nitroGLYCERIN (NITROSTAT) 0.4 MG SL tablet   Sublingual   Place 0.4 mg under the tongue every 5 (five) minutes as needed for chest pain.         Marland Kitchen oxyCODONE (ROXICODONE) 5 MG immediate release tablet   Oral   Take 1 tablet (5 mg total) by mouth every 6 (six) hours as needed for breakthrough pain.   6 tablet   0   . potassium chloride SA (K-DUR,KLOR-CON) 20 MEQ tablet      20 mEq. Wednesday         . RABEprazole (ACIPHEX) 20 MG tablet   Oral   Take 20 mg by mouth daily.          Marland Kitchen RANEXA 1000 MG SR tablet      TAKE 1 TABLET (1,000 MG TOTAL) BY MOUTH 2 (TWO) TIMES DAILY.   60 tablet   6   . ranitidine (ZANTAC) 150 MG capsule   Oral   Take 150 mg by mouth 2 (two) times daily.         Marland Kitchen senna (SENOKOT) 8.6 MG tablet   Oral   Take 1 tablet by mouth daily.         Marland Kitchen venlafaxine (EFFEXOR) 75 MG tablet   Oral   Take 75 mg by mouth 2 (two) times daily.            Allergies Review of patient's allergies indicates no known allergies.   Family History  Problem Relation Age of Onset  . Hypertension Mother   . Hyperlipidemia Mother   . Hypertension Father   . Hyperlipidemia Father     Social History Social History  Substance Use Topics  . Smoking status: Never Smoker   . Smokeless tobacco: None  . Alcohol Use: No    Review of Systems  Constitutional:   No fever or chills. No weight changes Eyes:   No blurry  vision or double vision.  ENT:   No sore throat. Cardiovascular:   No chest pain. Respiratory:   No dyspnea or cough. Gastrointestinal:   Negative for abdominal pain, vomiting and diarrhea.  No BRBPR or melena. Genitourinary:   Negative for dysuria, urinary retention, bloody urine, or difficulty urinating. Musculoskeletal:   Negative for back pain. Left leg pain as above Skin:   Negative for rash. Neurological:   Negative for headaches, focal weakness or numbness. Psychiatric:  No anxiety or depression.   Endocrine:  No  hot/cold intolerance, changes in energy, or sleep difficulty.  10-point ROS otherwise negative.  ____________________________________________   PHYSICAL EXAM:  VITAL SIGNS: ED Triage Vitals  Enc Vitals Group     BP 12/06/15 1408 120/84 mmHg     Pulse Rate 12/06/15 1408 78     Resp 12/06/15 1408 20     Temp 12/06/15 1408 97.9 F (36.6 C)     Temp Source 12/06/15 1408 Oral     SpO2 12/06/15 1408 97 %     Weight 12/06/15 1408 275 lb (124.739 kg)     Height 12/06/15 1408 5\' 10"  (1.778 m)     Head Cir --      Peak Flow --      Pain Score 12/06/15 1409 4     Pain Loc --      Pain Edu? --      Excl. in GC? --     Vital signs reviewed, nursing assessments reviewed.   Constitutional:   Alert and oriented. Well appearing and in no distress. Eyes:   No scleral icterus. No conjunctival pallor. PERRL. EOMI ENT   Head:   Normocephalic and atraumatic.   Nose:   No congestion/rhinnorhea. No septal hematoma   Mouth/Throat:   MMM, no pharyngeal erythema. No peritonsillar mass. No uvula shift.   Neck:   No stridor. No SubQ emphysema. No meningismus. Hematological/Lymphatic/Immunilogical:   No cervical lymphadenopathy. Cardiovascular:   RRR. Normal and symmetric distal pulses are present in all extremities. No murmurs, rubs, or gallops. Respiratory:   Normal respiratory effort without tachypnea nor retractions. Breath sounds are clear and equal bilaterally. No wheezes/rales/rhonchi. Gastrointestinal:   Soft and nontender. No distention. There is no CVA tenderness.  No rebound, rigidity, or guarding. Genitourinary:   deferred Musculoskeletal:   Tenderness in the proximal tibia. The anterior shin overlying the mid shaft tibia has a large curvilinear laceration approximately 10 cm in total length. This extends down through the soft tissues and there is exposed tibia underneath the wound when retracted. No pulsatile bleeding. Hemostatic at this time. Intact flexor and extensor  function in the foot and ankle directions including dorsiflexion and plantarflexion toe movements E version and inversion. Patient is able to roll the ankle and a smooth cervical and 360 counterclockwise and clockwise. No sensory deficits. Good DP and PT pulses distal to the wound. No bony instability or point tenderness. Knee is stable. Ankle is stable and nontender. Other extremities are unremarkable. Neurologic:   Normal speech and language.  CN 2-10 normal. Motor grossly intact. No pronator drift.  Normal gait. No gross focal neurologic deficits are appreciated.  Skin:    Skin is warm, dry and intact. No rash noted.  No petechiae, purpura, or bullae. Psychiatric:   Mood and affect are normal. Speech and behavior are normal. Patient exhibits appropriate insight and judgment.  ____________________________________________    LABS (pertinent positives/negatives) (all labs ordered are listed, but only abnormal results are displayed) Labs  Reviewed - No data to display ____________________________________________   EKG    ____________________________________________    RADIOLOGY  X-ray left tib-fib unremarkable  ____________________________________________   PROCEDURES LACERATION REPAIR Performed by: Scotty Court, Shondra Capps Authorized by: Sharman Cheek Consent: Verbal consent obtained. Risks and benefits: risks, benefits and alternatives were discussed Consent given by: patient Patient identity confirmed: provided demographic data Prepped and Draped in normal sterile fashion Wound explored  Laceration Location: Left anterior shin  Laceration Length: 10cm  No Foreign Bodies seen or palpated  Anesthesia: local infiltration  Local anesthetic: lidocaine 1% with epinephrine  Anesthetic total: 20 ml  Irrigation method: syringe Amount of cleaning: Copious irrigation with pressure syringe   Complex wound repair Deep tissue closure with 5 4-0 Monocryl sutures Skin  closure: 4-0 Monocryl   Number of sutures: 20   Technique: Running .  Wound closed with 2 running sutures that both originate from the most angulated portion of the wound and extended to the margins from there.   Patient tolerance: Patient tolerated the procedure well with no immediate complications.   ____________________________________________   INITIAL IMPRESSION / ASSESSMENT AND PLAN / ED COURSE  Pertinent labs & imaging results that were available during my care of the patient were reviewed by me and considered in my medical decision making (see chart for details).  Patient presents with a soft tissue laceration that is deep and extends all the way to the anterior surface of the tibia after blunt trauma. No evidence of any tendon or vascular injury. No sensory deficits. Strong peripheral pulses and warm foot. X-ray unremarkable. Wound cleaned and repaired. Reports he had a tetanus shot within the last 2 years so he is up-to-date and does not need another one today. Patient will apply Neosporin and change dressing once a day and follow up with primary care.     ____________________________________________   FINAL CLINICAL IMPRESSION(S) / ED DIAGNOSES  Final diagnoses:  Leg laceration, left, initial encounter      Sharman Cheek, MD 12/06/15 1718

## 2015-12-06 NOTE — ED Notes (Addendum)
Pt to ed with c/o laceration to left lower leg.  Pt states he was standing up on chair and fell landed on left lower leg onto chair,  Now with large laceration to left lower leg, bleeding controlled at this time.  Pt on plavix.

## 2015-12-07 ENCOUNTER — Encounter: Payer: Self-pay | Admitting: Cardiovascular Disease

## 2015-12-09 ENCOUNTER — Telehealth: Payer: Self-pay

## 2015-12-09 NOTE — Telephone Encounter (Signed)
Pt states on Saturday, he was changing a light bulb, stepped on a chair with his left leg, he thinks he passed out, states he remembers hitting the floor.  He "gashed" his shin, and had to have stitches. States he has had episodes of blacking out before. He just wanted to let us know. Please call.

## 2015-12-09 NOTE — Telephone Encounter (Signed)
Message sent back to patient

## 2015-12-09 NOTE — Telephone Encounter (Signed)
Please see MyChart message from pt's wife.

## 2015-12-15 DIAGNOSIS — I10 Essential (primary) hypertension: Secondary | ICD-10-CM | POA: Diagnosis not present

## 2015-12-15 DIAGNOSIS — E119 Type 2 diabetes mellitus without complications: Secondary | ICD-10-CM | POA: Diagnosis not present

## 2015-12-15 DIAGNOSIS — S81812A Laceration without foreign body, left lower leg, initial encounter: Secondary | ICD-10-CM | POA: Diagnosis not present

## 2015-12-18 DIAGNOSIS — Z4802 Encounter for removal of sutures: Secondary | ICD-10-CM | POA: Diagnosis not present

## 2016-01-30 DIAGNOSIS — N401 Enlarged prostate with lower urinary tract symptoms: Secondary | ICD-10-CM | POA: Diagnosis not present

## 2016-01-30 DIAGNOSIS — Z8042 Family history of malignant neoplasm of prostate: Secondary | ICD-10-CM | POA: Diagnosis not present

## 2016-01-30 DIAGNOSIS — Z6841 Body Mass Index (BMI) 40.0 and over, adult: Secondary | ICD-10-CM | POA: Diagnosis not present

## 2016-01-30 DIAGNOSIS — R339 Retention of urine, unspecified: Secondary | ICD-10-CM | POA: Diagnosis not present

## 2016-01-30 DIAGNOSIS — N138 Other obstructive and reflux uropathy: Secondary | ICD-10-CM | POA: Diagnosis not present

## 2016-01-30 DIAGNOSIS — N183 Chronic kidney disease, stage 3 (moderate): Secondary | ICD-10-CM | POA: Diagnosis not present

## 2016-01-30 DIAGNOSIS — N433 Hydrocele, unspecified: Secondary | ICD-10-CM | POA: Diagnosis not present

## 2016-03-23 ENCOUNTER — Telehealth: Payer: Self-pay | Admitting: Cardiovascular Disease

## 2016-03-23 NOTE — Telephone Encounter (Signed)
Notified patient he could try to get Effient through the patient assistance program. Told the patient we could print off and have him pick up the forms but the patient states he will google and call if he has any difficulties with the forms.

## 2016-03-23 NOTE — Telephone Encounter (Signed)
Pt is calling stating he is now in donut hole with his insurance  His co pay was $45 now is $225 a month.  Wanted to know if there is another medication or another option to help with this ordeal. Renexa is the medication.  Please advise

## 2016-03-28 ENCOUNTER — Encounter: Payer: Self-pay | Admitting: Cardiovascular Disease

## 2016-05-12 ENCOUNTER — Ambulatory Visit (INDEPENDENT_AMBULATORY_CARE_PROVIDER_SITE_OTHER): Payer: PPO | Admitting: Cardiovascular Disease

## 2016-05-12 ENCOUNTER — Encounter: Payer: Self-pay | Admitting: Cardiovascular Disease

## 2016-05-12 VITALS — BP 110/78 | HR 68 | Ht 70.0 in | Wt 281.0 lb

## 2016-05-12 DIAGNOSIS — I2510 Atherosclerotic heart disease of native coronary artery without angina pectoris: Secondary | ICD-10-CM

## 2016-05-12 DIAGNOSIS — I951 Orthostatic hypotension: Secondary | ICD-10-CM | POA: Diagnosis not present

## 2016-05-12 DIAGNOSIS — Z951 Presence of aortocoronary bypass graft: Secondary | ICD-10-CM

## 2016-05-12 DIAGNOSIS — W19XXXS Unspecified fall, sequela: Secondary | ICD-10-CM

## 2016-05-12 DIAGNOSIS — R0602 Shortness of breath: Secondary | ICD-10-CM

## 2016-05-12 DIAGNOSIS — E1159 Type 2 diabetes mellitus with other circulatory complications: Secondary | ICD-10-CM

## 2016-05-12 DIAGNOSIS — E785 Hyperlipidemia, unspecified: Secondary | ICD-10-CM | POA: Diagnosis not present

## 2016-05-12 MED ORDER — FLUTICASONE-SALMETEROL 115-21 MCG/ACT IN AERO
2.0000 | INHALATION_SPRAY | Freq: Two times a day (BID) | RESPIRATORY_TRACT | Status: DC
Start: 2016-05-12 — End: 2024-10-16

## 2016-05-12 NOTE — Patient Instructions (Signed)
You are doing well. No medication changes were made.  Please call us if you have new issues that need to be addressed before your next appt.  Your physician wants you to follow-up in: 6 months.  You will receive a reminder letter in the mail two months in advance. If you don't receive a letter, please call our office to schedule the follow-up appointment.   

## 2016-05-12 NOTE — Progress Notes (Signed)
Patient ID: [name], male   DOB: 01/24/1956, 60 y.o.   MRN: [id number] Cardiology Office Note  Date:  05/12/2016   ID:  Lee Coonsddie H Fleece, DOB 01/23/1956, MRN [id number]  PCP:  Dorothey BasemanAVID BRONSTEIN, MD   Chief Complaint  Patient presents with  . other    6 month f/u c/o sob with exertion. Meds reviewed verbally with pt.    HPI:  [name] is a 60 year-old gentleman with obesity, coronary artery disease, stenting to his RCA and circumflex in 2010, bypass surgery March 2014 with vein graft to the OM, LIMA to the LAD, hyperlipidemia, obstructive sleep apnea with compliance of his CPAP, chronic left-sided chest pain, worsening shortness of breath who presents for routine follow-up of his coronary artery disease  history of diabetes, esophageal spasm, bulging disc in his neck, GERD Episodes with chronic orthostasis, very symptomatic Etiology of his orthostasis is still unclear  Over the past several clinic visits, we have been slowly titrating midodrine and Florinef upwards for symptom relief With dramatic improvement of his symptoms  On his last clinic visit, we recommended he increase midodrine up to 15 mg morning and noon, Florinef every other day, hold Lasix. On today's visit, he is unclear which medications he is taking, list provided there is no Florinef, still on midodrine 10 mg twice a day Reports no syncope, no significant near-syncope. Active, recently fell off a chair while changing a light bulb, lacerated his left lower extremity He is happy with his current medication regimen  Lab work reviewed showing total cholesterol 144, LDL 80, hemoglobin A1c 5.8 Continues to struggle with his weight Denies any significant leg edema  EKG shows no sinus rhythm with rate 68 bpm, no significant ST or T-wave changes  Other past medical history  October 31 2015 having episode of near syncope, got up from a recliner, felt very lightheaded, recovered without syncope One episode approximately  10 days prior to that, getting up from a lawnmower to a upright position, had near-syncope syncope At that time we increased his midodrine up to 10 mg, Florinef 0.1 mg every other day  Previous orthostasis numbers/blood pressure numbers at home continue to showdrops in blood pressure with standing sometimes 80 systolic  Previous Orthostatics done in the office shows systolic pressure supine 133/86 with heart rate 69 Sitting 107/76 heart rate 75 Standing 98/63 with heart rate 77 Repeat standing 104/73 with heart rate 91  Previous episode of falling down or falling into people at a baseball game  He did complete pulmonary rehabilitation where he was exercising 3 days per week.  Denies significant shortness of breath or chest discomfort  He is on disability for his exercise intolerance Previously reported having lightheaded spells getting out of the car. Lasix dosing was decreased   lab work reviewed with him showing total cholesterol 144, LDL 80  PMH:   has a past medical history of Type 2 diabetes mellitus (HCC); Coronary artery disease; GERD (gastroesophageal reflux disease); Hyperlipidemia; Hypertension; Atherosclerosis of coronary artery bypass graft with angina pectoris (HCC); Sleep apnea; Barrett's esophagus; Anxiety; H/O esophageal spasm; Chronic constipation; Colon polyp; and MI (myocardial infarction) (HCC).  PSH:    Past Surgical History  Procedure Laterality Date  . Tee with cardioversion    . Anal fissure repair    . Abscess cellulitis resection     . Colonoscopy    . Coronary artery bypass graft  2014    CABG x 2  . Cardiac catheterization  2011  .  Coronary angioplasty with stent placement  2011    stent placement x 3 @ [facility]; Dr. Darrold Junker    Current Outpatient Prescriptions  Medication Sig Dispense Refill  . acetaminophen (TYLENOL) 325 MG tablet Take 650 mg by mouth every 6 (six) hours as needed.    Marland Kitchen albuterol (PROVENTIL HFA;VENTOLIN HFA) 108 (90 BASE) MCG/ACT  inhaler Inhale into the lungs every 6 (six) hours as needed for wheezing or shortness of breath.    . ALPRAZolam (XANAX) 0.5 MG tablet Reported on 11/10/2015    . aspirin 81 MG tablet Take 81 mg by mouth daily.    Marland Kitchen atorvastatin (LIPITOR) 40 MG tablet Take 40 mg by mouth daily.    Marland Kitchen buPROPion (WELLBUTRIN SR) 150 MG 12 hr tablet Take 150 mg by mouth 2 (two) times daily.    . clopidogrel (PLAVIX) 75 MG tablet Take 75 mg by mouth daily.    . Cyanocobalamin (B-12) 1000 MCG CAPS Take by mouth daily.    . fluticasone-salmeterol (ADVAIR HFA) 115-21 MCG/ACT inhaler Inhale 2 puffs into the lungs 2 (two) times daily. 1 Inhaler 6  . gabapentin (NEURONTIN) 300 MG capsule Take 300 mg by mouth 2 (two) times daily.     . metFORMIN (GLUCOPHAGE) 850 MG tablet Take 850 mg by mouth daily with breakfast.     . metoprolol tartrate (LOPRESSOR) 25 MG tablet Take 12.5 mg by mouth 2 (two) times daily.    . midodrine (PROAMATINE) 10 MG tablet Take 1 tablet (10 mg total) by mouth 4 (four) times daily - after meals and at bedtime. 120 tablet 6  . nitroGLYCERIN (NITROSTAT) 0.4 MG SL tablet Place 0.4 mg under the tongue every 5 (five) minutes as needed for chest pain.    . RABEprazole (ACIPHEX) 20 MG tablet Take 20 mg by mouth daily.     . ranitidine (ZANTAC) 150 MG capsule Take 150 mg by mouth 2 (two) times daily.    . tamsulosin (FLOMAX) 0.4 MG CAPS capsule Take 0.4 mg by mouth daily.    Marland Kitchen venlafaxine (EFFEXOR) 75 MG tablet Take 75 mg by mouth 2 (two) times daily.     No current facility-administered medications for this visit.     Allergies:   Review of patient's allergies indicates no known allergies.   Social History:  The patient  reports that he has never smoked. He does not have any smokeless tobacco history on file. He reports that he does not drink alcohol or use illicit drugs.   Family History:   family history includes Hyperlipidemia in his father and mother; Hypertension in his father and mother.     Review of Systems: Review of Systems  Constitutional: Negative.   Respiratory: Negative.   Cardiovascular: Negative.   Gastrointestinal: Negative.   Musculoskeletal: Negative.   Neurological: Negative.   Psychiatric/Behavioral: Negative.   All other systems reviewed and are negative.    PHYSICAL EXAM: VS:  BP 110/78 mmHg  Pulse 68  Ht  (1.778 m)  Wt 281 lb (127.461 kg)  BMI 40.32 kg/m2 , BMI Body mass index is 40.32 kg/(m^2). GEN: Well nourished, well developed, in no acute distress HEENT: normal Neck: no JVD, carotid bruits, or masses Cardiac: RRR; no murmurs, rubs, or gallops,no edema  Respiratory:  clear to auscultation bilaterally, normal work of breathing GI: soft, nontender, nondistended, + BS MS: no deformity or atrophy Skin: warm and dry, no rash Neuro:  Strength and sensation are intact Psych: euthymic mood, full affect  Recent Labs: No results found for requested labs within last 365 days.    Lipid Panel Lab Results  Component Value Date   CHOL  11/19/2009    114        ATP III CLASSIFICATION:  <200     mg/dL   Desirable  161-096  mg/dL   Borderline High  >=045    mg/dL   High          HDL 29* 11/19/2009   LDLCALC  11/19/2009    70        Total Cholesterol/HDL:CHD Risk Coronary Heart Disease Risk Table                     Men   Women  1/2 Average Risk   3.4   3.3  Average Risk       5.0   4.4  2 X Average Risk   9.6   7.1  3 X Average Risk  23.4   11.0        Use the calculated Patient Ratio above and the CHD Risk Table to determine the patient's CHD Risk.        ATP III CLASSIFICATION (LDL):  <100     mg/dL   Optimal  409-811  mg/dL   Near or Above                    Optimal  130-159  mg/dL   Borderline  914-782  mg/dL   High  >956     mg/dL   Very High   TRIG 75 21/30/8657      Wt Readings from Last 3 Encounters:  05/12/16 281 lb (127.461 kg)  12/06/15 275 lb (124.739 kg)  11/10/15 277 lb (125.646 kg)        ASSESSMENT AND PLAN:  Coronary artery disease involving native coronary artery of native heart without angina pectoris - Plan: EKG 12-Lead Currently with no symptoms of angina. No further workup at this time. Continue current medication regimen.  Orthostatic hypotension Symptoms improved with midodrine. We'll confirm he is still taking Florinef Lasix held previously for symptoms  Hyperlipidemia Cholesterol is at goal on the current lipid regimen. No changes to the medications were made.  Falls, sequela Recent fall off a chair changing a light bulb, large laceration to his left leg, now well-healed Recommended weight loss, conditioning  Shortness of breath Likely secondary to deconditioning, morbid obesity Recommended regular exercise program, weight loss  S/P CABG x 2 As above, no symptoms of angina, no further testing at this time  Type 2 diabetes mellitus with other circulatory complications (HCC) Dramatic improvement in his hemoglobin A1c over the past 2 years.  Morbid obesity due to excess calories (HCC) Recommended strict diet, low carbohydrates  Disposition:   F/U  6 months   Orders Placed This Encounter  Procedures  . EKG 12-Lead    Total encounter time more than 15 minutes  Greater than 50% was spent in counseling and coordination of care with the patient   Signed, Dossie Arbour, M.D., Ph.D. 05/12/2016  [facility] [place], [place] [phone]

## 2016-06-23 DIAGNOSIS — M1712 Unilateral primary osteoarthritis, left knee: Secondary | ICD-10-CM | POA: Diagnosis not present

## 2016-06-23 DIAGNOSIS — M25562 Pain in left knee: Secondary | ICD-10-CM | POA: Diagnosis not present

## 2016-06-23 DIAGNOSIS — G8929 Other chronic pain: Secondary | ICD-10-CM | POA: Diagnosis not present

## 2016-06-24 DIAGNOSIS — G8929 Other chronic pain: Secondary | ICD-10-CM | POA: Diagnosis not present

## 2016-06-24 DIAGNOSIS — M1712 Unilateral primary osteoarthritis, left knee: Secondary | ICD-10-CM | POA: Diagnosis not present

## 2016-06-24 DIAGNOSIS — M25562 Pain in left knee: Secondary | ICD-10-CM | POA: Diagnosis not present

## 2016-07-09 ENCOUNTER — Other Ambulatory Visit: Payer: Self-pay | Admitting: Cardiovascular Disease

## 2016-08-15 ENCOUNTER — Encounter: Payer: Self-pay | Admitting: Cardiovascular Disease

## 2016-08-16 ENCOUNTER — Other Ambulatory Visit: Payer: Self-pay

## 2016-08-16 MED ORDER — MIDODRINE HCL 10 MG PO TABS: 10.0000 mg | ORAL_TABLET | Freq: Three times a day (TID) | ORAL | 6 refills | Status: DC

## 2016-09-29 DIAGNOSIS — I25708 Atherosclerosis of coronary artery bypass graft(s), unspecified, with other forms of angina pectoris: Secondary | ICD-10-CM | POA: Diagnosis not present

## 2016-09-29 DIAGNOSIS — I10 Essential (primary) hypertension: Secondary | ICD-10-CM | POA: Diagnosis not present

## 2016-09-29 DIAGNOSIS — E119 Type 2 diabetes mellitus without complications: Secondary | ICD-10-CM | POA: Diagnosis not present

## 2016-09-29 DIAGNOSIS — Z23 Encounter for immunization: Secondary | ICD-10-CM | POA: Diagnosis not present

## 2016-09-29 DIAGNOSIS — E785 Hyperlipidemia, unspecified: Secondary | ICD-10-CM | POA: Diagnosis not present

## 2016-12-21 DIAGNOSIS — E119 Type 2 diabetes mellitus without complications: Secondary | ICD-10-CM | POA: Diagnosis not present

## 2017-01-21 ENCOUNTER — Encounter: Payer: Self-pay | Admitting: Cardiovascular Disease

## 2017-01-21 ENCOUNTER — Ambulatory Visit (INDEPENDENT_AMBULATORY_CARE_PROVIDER_SITE_OTHER): Payer: PPO | Admitting: Cardiovascular Disease

## 2017-01-21 VITALS — BP 100/78 | HR 68 | Ht 70.0 in | Wt 279.0 lb

## 2017-01-21 DIAGNOSIS — E785 Hyperlipidemia, unspecified: Secondary | ICD-10-CM | POA: Diagnosis not present

## 2017-01-21 DIAGNOSIS — I2510 Atherosclerotic heart disease of native coronary artery without angina pectoris: Secondary | ICD-10-CM

## 2017-01-21 DIAGNOSIS — I951 Orthostatic hypotension: Secondary | ICD-10-CM

## 2017-01-21 DIAGNOSIS — R0789 Other chest pain: Secondary | ICD-10-CM

## 2017-01-21 DIAGNOSIS — R0602 Shortness of breath: Secondary | ICD-10-CM

## 2017-01-21 DIAGNOSIS — Z951 Presence of aortocoronary bypass graft: Secondary | ICD-10-CM

## 2017-01-21 MED ORDER — EZETIMIBE 10 MG PO TABS: 10.0000 mg | ORAL_TABLET | Freq: Every day | ORAL | 11 refills | Status: DC

## 2017-01-21 NOTE — Patient Instructions (Addendum)
Medication Instructions:   Please add zetia once a day with the atorvastatin  Check for fludrocortiscone/florinef every other day?  Labwork:  No new labs needed  Testing/Procedures:  We will schedule a lexiscan myoview for chest tightness and shortness of breath [facility]  Your caregiver has ordered a Stress Test with nuclear imaging. The purpose of this test is to evaluate the blood supply to your heart muscle. This procedure is referred to as a "Non-Invasive Stress Test." This is because other than having an IV started in your vein, nothing is inserted or "invades" your body. Cardiac stress tests are done to find areas of poor blood flow to the heart by determining the extent of coronary artery disease (CAD). Some patients exercise on a treadmill, which naturally increases the blood flow to your heart, while others who are  unable to walk on a treadmill due to physical limitations have a pharmacologic/chemical stress agent called Lexiscan . This medicine will mimic walking on a treadmill by temporarily increasing your coronary blood flow.   Please note: these test may take anywhere between 2-4 hours to complete  [facility] REPORT TO [facility]  THE VOLUNTEERS AT THE FIRST DESK WILL DIRECT YOU WHERE TO GO  Date of Procedure:_____Wednesday, March 7_____  Arrival Time for Procedure:__10:15 am_________  Instructions regarding medication:   __X__ : Hold diabetes medication morning of procedure  __X__:  Hold METOPROLOL the night before and morning of procedure  How to prepare for your Myoview test:  1. Do not eat or drink after midnight 2. No caffeine for 24 hours prior to test 3. No smoking 24 hours prior to test. 4. Your medication may be taken with water.  If your doctor stopped a medication because of this test, do not take that medication. 5. Ladies, please do not wear dresses.  Skirts or pants are appropriate. Please wear a short sleeve shirt. 6. No perfume,  cologne or lotion.   I recommend watching educational videos on topics of interest to you at:       [email or web address]  Enter code: HEARTCARE    Follow-Up: It was a pleasure seeing you in the office today. Please call us if you have new issues that need to be addressed before your next appt.  [phone]  Your physician wants you to follow-up in: 6 months.  You will receive a reminder letter in the mail two months in advance. If you don't receive a letter, please call our office to schedule the follow-up appointment.  If you need a refill on your cardiac medications before your next appointment, please call your pharmacy.    Pharmacologic Stress Electrocardiogram Introduction A pharmacologic stress electrocardiogram is a heart (cardiac) test that uses nuclear imaging to evaluate the blood supply to your heart. This test may also be called a pharmacologic stress electrocardiography. Pharmacologic means that a medicine is used to increase your heart rate and blood pressure. This stress test is done to find areas of poor blood flow to the heart by determining the extent of coronary artery disease (CAD). Some people exercise on a treadmill, which naturally increases the blood flow to the heart. For those people unable to exercise on a treadmill, a medicine is used. This medicine stimulates your heart and will cause your heart to beat harder and more quickly, as if you were exercising. Pharmacologic stress tests can help determine:  The adequacy of blood flow to your heart during increased levels of activity in order to clear you  for discharge home.  The extent of coronary artery blockage caused by CAD.  Your prognosis if you have suffered a heart attack.  The effectiveness of cardiac procedures done, such as an angioplasty, which can increase the circulation in your coronary arteries.  Causes of chest pain or pressure. LET [facility] CARE PROVIDER KNOW ABOUT:  Any allergies you  have.  All medicines you are taking, including vitamins, herbs, eye drops, creams, and over-the-counter medicines.  Previous problems you or members of your family have had with the use of anesthetics.  Any blood disorders you have.  Previous surgeries you have had.  Medical conditions you have.  Possibility of pregnancy, if this applies.  If you are currently breastfeeding. RISKS AND COMPLICATIONS Generally, this is a safe procedure. However, as with any procedure, complications can occur. Possible complications include:  You develop pain or pressure in the following areas:  Chest.  Jaw or neck.  Between your shoulder blades.  Radiating down your left arm.  Headache.  Dizziness or light-headedness.  Shortness of breath.  Increased or irregular heartbeat.  Low blood pressure.  Nausea or vomiting.  Flushing.  Redness going up the arm and slight pain during injection of medicine.  Heart attack (rare). BEFORE THE PROCEDURE  Avoid all forms of caffeine for 24 hours before your test or as directed by your health care provider. This includes coffee, tea (even decaffeinated tea), caffeinated sodas, chocolate, cocoa, and certain pain medicines.  Follow your health care provider's instructions regarding eating and drinking before the test.  Take your medicines as directed at regular times with water unless instructed otherwise. Exceptions may include:  If you have diabetes, ask how you are to take your insulin or pills. It is common to adjust insulin dosing the morning of the test.  If you are taking beta-blocker medicines, it is important to talk to your health care provider about these medicines well before the date of your test. Taking beta-blocker medicines may interfere with the test. In some cases, these medicines need to be changed or stopped 24 hours or more before the test.  If you wear a nitroglycerin patch, it may need to be removed prior to the test. Ask  your health care provider if the patch should be removed before the test.  If you use an inhaler for any breathing condition, bring it with you to the test.  If you are an outpatient, bring a snack so you can eat right after the stress phase of the test.  Do not smoke for 4 hours prior to the test or as directed by your health care provider.  Do not apply lotions, powders, creams, or oils on your chest prior to the test.  Wear comfortable shoes and clothing. Let your health care provider know if you were unable to complete or follow the preparations for your test. PROCEDURE  Multiple patches (electrodes) will be put on your chest. If needed, small areas of your chest may be shaved to get better contact with the electrodes. Once the electrodes are attached to your body, multiple wires will be attached to the electrodes, and your heart rate will be monitored.  An IV access will be started. A nuclear trace (isotope) is given. The isotope may be given intravenously, or it may be swallowed. Nuclear refers to several types of radioactive isotopes, and the nuclear isotope lights up the arteries so that the nuclear images are clear. The isotope is absorbed by your body. This results in  low radiation exposure.  A resting nuclear image is taken to show how your heart functions at rest.  A medicine is given through the IV access.  A second scan is done about 1 hour after the medicine injection and determines how your heart functions under stress.  During this stress phase, you will be connected to an electrocardiogram machine. Your blood pressure and oxygen levels will be monitored. What to expect after the procedure  Your heart rate and blood pressure will be monitored after the test.  You may return to your normal schedule, including diet,activities, and medicines, unless your health care provider tells you otherwise. This information is not intended to replace advice given to you by your  health care provider. Make sure you discuss any questions you have with your health care provider. Document Released: 03/27/2009 Document Revised: 04/15/2016 Document Reviewed: 05/17/2016 Elsevier Interactive Patient Education  2017 [employer].

## 2017-01-21 NOTE — Progress Notes (Signed)
Cardiology Office Note  Date:  01/21/2017   ID:  [name], DOB 10/21/1956, MRN [id number]  PCP:  Lee BasemanAVID BRONSTEIN, MD   Chief Complaint  Patient presents with  . other    6 month follow up. Meds reviewed by the pt. verbally. Pt. c/o breaking out into a sweat, chest pain, right shoulder blade and shortness of breath about 1 month ago.     HPI:  [name] is a 61 year-old gentleman with obesity, coronary artery disease, stenting to his RCA and circumflex in 2010, bypass surgery March 2014 with vein graft to the OM, LIMA to the LAD, hyperlipidemia, obstructive sleep apnea with compliance of his CPAP, chronic left-sided chest pain, worsening shortness of breath who presents for routine follow-up of his coronary artery disease  history of diabetes, esophageal spasm, bulging disc in his neck, GERD Episodes with chronic orthostasis, very symptomatic Etiology of his orthostasis is still unclear  In follow-up today he is not taking fludrocortisone He is taking midodrine 1, up to 2 per day Denies any orthostasis symptoms Weight continues to run high, no regular exercise program  He reports having worsening SOB Several severe episodes of shortness of breath with exertion, 3 bad episodes 1 was working on his lawnmower second one was with heavy walking Reports walking on a treadmill once almost collapsed Occasional chest tightness when he has these episodes Has to bend over, would his arms on his knees to recover  Denies any significant leg swelling  Lab work reviewed with him in detail HBA1C 6.3 Trend upwards in his total cholesterol now more than 160 Reports he is compliant with his Lipitor 40  Denies any near-syncope or syncope when taking midodrine  EKG reviewed personally by myself shows no sinus rhythm with rate 68 bpm, no significant ST or T-wave changes  Other past medical history  October 31 2015 having episode of near syncope, got up from a recliner, felt very  lightheaded, recovered without syncope One episode approximately 10 days prior to that, getting up from a lawnmower to a upright position, had near-syncope syncope At that time we increased his midodrine up to 10 mg, Florinef 0.1 mg every other day  Previous orthostasis numbers/blood pressure numbers at home continue to showdrops in blood pressure with standing sometimes 80 systolic  Previous Orthostatics done in the office shows systolic pressure supine 133/86 with heart rate 69 Sitting 107/76 heart rate 75 Standing 98/63 with heart rate 77 Repeat standing 104/73 with heart rate 91  Previous episode of falling down or falling into people at a baseball game  He did complete pulmonary rehabilitation where he was exercising 3 days per week.  Denies significant shortness of breath or chest discomfort  He is on disability for his exercise intolerance Previously reported having lightheaded spells getting out of the car. Lasix dosing was decreased   lab work reviewed with him showing total cholesterol 144, LDL 80   PMH:   has a past medical history of Anxiety; Atherosclerosis of coronary artery bypass graft with angina pectoris (HCC); Barrett's esophagus; Chronic constipation; Colon polyp; Coronary artery disease; GERD (gastroesophageal reflux disease); H/O esophageal spasm; Hyperlipidemia; Hypertension; MI (myocardial infarction); Sleep apnea; and Type 2 diabetes mellitus (HCC).  PSH:    Past Surgical History:  Procedure Laterality Date  . abscess cellulitis resection     . ANAL FISSURE REPAIR    . CARDIAC CATHETERIZATION  2011  . COLONOSCOPY    . CORONARY ANGIOPLASTY WITH STENT PLACEMENT  2011  stent placement x 3 @ [facility]; Dr. Darrold Junker  . CORONARY ARTERY BYPASS GRAFT  2014   CABG x 2  . TEE WITH CARDIOVERSION      Current Outpatient Prescriptions  Medication Sig Dispense Refill  . acetaminophen (TYLENOL) 325 MG tablet Take 650 mg by mouth every 6 (six) hours as needed.     Marland Kitchen albuterol (PROVENTIL HFA;VENTOLIN HFA) 108 (90 BASE) MCG/ACT inhaler Inhale into the lungs every 6 (six) hours as needed for wheezing or shortness of breath.    . ALPRAZolam (XANAX) 0.5 MG tablet Reported on 11/10/2015    . aspirin 81 MG tablet Take 81 mg by mouth daily.    Marland Kitchen atorvastatin (LIPITOR) 40 MG tablet Take 40 mg by mouth daily.    Marland Kitchen buPROPion (WELLBUTRIN SR) 150 MG 12 hr tablet Take 150 mg by mouth 2 (two) times daily.    . clopidogrel (PLAVIX) 75 MG tablet Take 75 mg by mouth daily.    . diphenhydramine-acetaminophen (TYLENOL PM EXTRA STRENGTH) 25-500 MG TABS tablet Take 1 tablet by mouth at bedtime as needed.    . fluticasone-salmeterol (ADVAIR HFA) 115-21 MCG/ACT inhaler Inhale 2 puffs into the lungs 2 (two) times daily. 1 Inhaler 6  . gabapentin (NEURONTIN) 300 MG capsule Take 300 mg by mouth daily.     . metFORMIN (GLUCOPHAGE) 850 MG tablet Take 850 mg by mouth daily with breakfast.     . metoprolol tartrate (LOPRESSOR) 25 MG tablet Take 12.5 mg by mouth 2 (two) times daily.    . midodrine (PROAMATINE) 10 MG tablet Take 10 mg by mouth every morning.    . nitroGLYCERIN (NITROSTAT) 0.4 MG SL tablet Place 0.4 mg under the tongue every 5 (five) minutes as needed for chest pain.    . RABEprazole (ACIPHEX) 20 MG tablet Take 20 mg by mouth daily.     . ranitidine (ZANTAC) 150 MG capsule Take 150 mg by mouth daily.     . tamsulosin (FLOMAX) 0.4 MG CAPS capsule Take 0.4 mg by mouth daily.    Marland Kitchen venlafaxine (EFFEXOR) 75 MG tablet Take 75 mg by mouth 2 (two) times daily.     No current facility-administered medications for this visit.      Allergies:   Patient has no known allergies.   Social History:  The patient  reports that he has never smoked. He has never used smokeless tobacco. He reports that he does not drink alcohol or use drugs.   Family History:   family history includes Hyperlipidemia in his father and mother; Hypertension in his father and mother.    Review of  Systems: Review of Systems  Constitutional: Negative.   Respiratory: Negative.   Cardiovascular: Negative.   Gastrointestinal: Negative.   Musculoskeletal: Negative.   Neurological: Negative.   Psychiatric/Behavioral: Negative.   All other systems reviewed and are negative.    PHYSICAL EXAM: VS:  BP 100/78 (BP Location: Left Arm, Patient Position: Sitting, Cuff Size: Normal)   Pulse 68   Ht 5\' 10"  (1.778 m)   Wt 279 lb (126.6 kg)   BMI 40.03 kg/m  , BMI Body mass index is 40.03 kg/m. GEN: Well nourished, well developed, in no acute distress , obese HEENT: normal  Neck: no JVD, carotid bruits, or masses Cardiac: RRR; no murmurs, rubs, or gallops,no edema  Respiratory:  clear to auscultation bilaterally, normal work of breathing GI: soft, nontender, nondistended, + BS MS: no deformity or atrophy  Skin: warm and dry, no rash Neuro:  Strength and sensation are intact Psych: euthymic mood, full affect    Recent Labs: No results found for requested labs within last 8760 hours.    Lipid Panel Reviewed through care everywhere  Wt Readings from Last 3 Encounters:  01/21/17 279 lb (126.6 kg)  05/12/16 281 lb (127.5 kg)  12/06/15 275 lb (124.7 kg)       ASSESSMENT AND PLAN:  Hyperlipidemia, unspecified hyperlipidemia type - Plan: EKG 12-Lead Cholesterol is above goal, discussed various treatment options with him We have recommended he add zetia 10 mg daily with his Lipitor 40 mg daily  Orthostatic hypotension - Plan: EKG 12-Lead Blood pressure adequate, taking midodrine one or 2 per day It would seem he is weaned himself off the fludrocortisone Discussed in detail with his wife does his medications  Coronary artery disease involving native coronary artery of native heart without angina pectoris - Plan: EKG 12-Lead Worsening symptoms shortness of breath, workup as detailed below  Chest discomfort - Plan: EKG 12-Lead Discomfort in the setting of severe shortness of  breath, stress test ordered as below  Shortness of breath - Plan: EKG 12-Lead Severe symptoms per the patient Sometimes with chest tightness when he has severe episodes, unable to exclude ischemia Recommended pharmacologic Myoview to rule out ischemia He is unable to treadmill given her profound deconditioning  if stress test is normal, we could consider echocardiogram to rule out elevated right heart pressures and to assess his left ventricular function We have recommended  regular exercise program for weight loss  S/P CABG x 2 - Plan: EKG 12-Lead As above, stress test has been ordered   Total encounter time more than 45 minutes  Greater than 50% was spent in counseling and coordination of care with the patient  Disposition:   F/U  6 months  Orders Placed This Encounter  Procedures  . EKG 12-Lead     Signed, Dossie Arbour, M.D., Ph.D. 01/21/2017  [facility] [place], [place] [phone]

## 2017-01-22 ENCOUNTER — Encounter: Payer: Self-pay | Admitting: Cardiovascular Disease

## 2017-01-26 ENCOUNTER — Ambulatory Visit
Admission: RE | Admit: 2017-01-26 | Discharge: 2017-01-26 | Disposition: A | Discharge status: Home / Self Care | Payer: PPO | Source: Ambulatory Visit | Attending: Cardiovascular Disease | Admitting: Cardiovascular Disease

## 2017-01-26 DIAGNOSIS — R0602 Shortness of breath: Secondary | ICD-10-CM | POA: Diagnosis not present

## 2017-01-26 DIAGNOSIS — I951 Orthostatic hypotension: Secondary | ICD-10-CM | POA: Diagnosis not present

## 2017-01-26 DIAGNOSIS — Z951 Presence of aortocoronary bypass graft: Secondary | ICD-10-CM | POA: Diagnosis not present

## 2017-01-26 DIAGNOSIS — I2510 Atherosclerotic heart disease of native coronary artery without angina pectoris: Secondary | ICD-10-CM | POA: Diagnosis not present

## 2017-01-26 DIAGNOSIS — R0789 Other chest pain: Secondary | ICD-10-CM | POA: Diagnosis not present

## 2017-01-26 DIAGNOSIS — E785 Hyperlipidemia, unspecified: Secondary | ICD-10-CM

## 2017-01-26 LAB — NM MYOCAR MULTI W/SPECT W/WALL MOTION / EF
CHL CUP NUCLEAR SDS: 0
CHL CUP NUCLEAR SRS: 0
CHL CUP STRESS STAGE 1 HR: 67 {beats}/min
CHL CUP STRESS STAGE 2 HR: 67 {beats}/min
CHL CUP STRESS STAGE 4 HR: 86 {beats}/min
CHL CUP STRESS STAGE 4 SPEED: 0 mph
CHL CUP STRESS STAGE 5 HR: 89 {beats}/min
CHL CUP STRESS STAGE 6 GRADE: 0 %
CSEPEW: 1 METS
CSEPHR: 57 %
CSEPPHR: 86 {beats}/min
CSEPPMHR: 54 %
LV dias vol: 72 mL (ref 62–150)
LV sys vol: 23 mL
NUC STRESS TID: 1.24
Rest HR: 68 {beats}/min
SSS: 0
Stage 2 Grade: 0 %
Stage 2 Speed: 0 mph
Stage 3 Grade: 0 %
Stage 3 HR: 66 {beats}/min
Stage 3 Speed: 0 mph
Stage 4 Grade: 0 %
Stage 5 Grade: 0 %
Stage 5 Speed: 0 mph
Stage 6 DBP: 66 mmHg
Stage 6 HR: 81 {beats}/min
Stage 6 SBP: 125 mmHg
Stage 6 Speed: 0 mph

## 2017-01-26 MED ORDER — TECHNETIUM TC 99M TETROFOSMIN IV KIT
30.4070 | PACK | Freq: Once | INTRAVENOUS | Status: AC | PRN
  Administered 2017-01-26: 30.407 via INTRAVENOUS

## 2017-01-26 MED ORDER — REGADENOSON 0.4 MG/5ML IV SOLN
0.4000 mg | Freq: Once | INTRAVENOUS | Status: AC
  Administered 2017-01-26: 0.4 mg via INTRAVENOUS

## 2017-01-26 MED ORDER — TECHNETIUM TC 99M TETROFOSMIN IV KIT
13.0000 | PACK | Freq: Once | INTRAVENOUS | Status: AC | PRN
  Administered 2017-01-26: 13.78 via INTRAVENOUS

## 2017-01-27 ENCOUNTER — Other Ambulatory Visit: Payer: Self-pay

## 2017-01-27 DIAGNOSIS — R0602 Shortness of breath: Secondary | ICD-10-CM

## 2017-01-27 NOTE — Progress Notes (Signed)
echo

## 2017-02-28 ENCOUNTER — Telehealth: Payer: Self-pay | Admitting: Cardiovascular Disease

## 2017-02-28 ENCOUNTER — Other Ambulatory Visit: Payer: Self-pay

## 2017-02-28 ENCOUNTER — Ambulatory Visit (INDEPENDENT_AMBULATORY_CARE_PROVIDER_SITE_OTHER): Payer: PPO

## 2017-02-28 DIAGNOSIS — R0602 Shortness of breath: Secondary | ICD-10-CM

## 2017-02-28 NOTE — Telephone Encounter (Signed)
error 

## 2017-02-28 NOTE — Telephone Encounter (Signed)
Refill Request:   Fludrocortisone 0.1 mg   [facility] [address] [place], [place]

## 2017-03-02 DIAGNOSIS — M1711 Unilateral primary osteoarthritis, right knee: Secondary | ICD-10-CM | POA: Diagnosis not present

## 2017-03-02 DIAGNOSIS — M170 Bilateral primary osteoarthritis of knee: Secondary | ICD-10-CM | POA: Diagnosis not present

## 2017-04-23 DIAGNOSIS — L03011 Cellulitis of right finger: Secondary | ICD-10-CM | POA: Diagnosis not present

## 2017-05-10 DIAGNOSIS — N138 Other obstructive and reflux uropathy: Secondary | ICD-10-CM | POA: Diagnosis not present

## 2017-05-10 DIAGNOSIS — R339 Retention of urine, unspecified: Secondary | ICD-10-CM | POA: Diagnosis not present

## 2017-05-10 DIAGNOSIS — N433 Hydrocele, unspecified: Secondary | ICD-10-CM | POA: Diagnosis not present

## 2017-05-10 DIAGNOSIS — N509 Disorder of male genital organs, unspecified: Secondary | ICD-10-CM | POA: Diagnosis not present

## 2017-05-10 DIAGNOSIS — Z8042 Family history of malignant neoplasm of prostate: Secondary | ICD-10-CM | POA: Diagnosis not present

## 2017-05-10 DIAGNOSIS — N401 Enlarged prostate with lower urinary tract symptoms: Secondary | ICD-10-CM | POA: Diagnosis not present

## 2017-05-10 DIAGNOSIS — Z6838 Body mass index (BMI) 38.0-38.9, adult: Secondary | ICD-10-CM | POA: Diagnosis not present

## 2017-05-16 DIAGNOSIS — R21 Rash and other nonspecific skin eruption: Secondary | ICD-10-CM | POA: Diagnosis not present

## 2017-05-23 DIAGNOSIS — I25708 Atherosclerosis of coronary artery bypass graft(s), unspecified, with other forms of angina pectoris: Secondary | ICD-10-CM | POA: Diagnosis not present

## 2017-05-23 DIAGNOSIS — K219 Gastro-esophageal reflux disease without esophagitis: Secondary | ICD-10-CM | POA: Diagnosis not present

## 2017-05-23 DIAGNOSIS — E785 Hyperlipidemia, unspecified: Secondary | ICD-10-CM | POA: Diagnosis not present

## 2017-05-23 DIAGNOSIS — Z79899 Other long term (current) drug therapy: Secondary | ICD-10-CM | POA: Diagnosis not present

## 2017-05-23 DIAGNOSIS — I10 Essential (primary) hypertension: Secondary | ICD-10-CM | POA: Diagnosis not present

## 2017-05-23 DIAGNOSIS — Z0000 Encounter for general adult medical examination without abnormal findings: Secondary | ICD-10-CM | POA: Diagnosis not present

## 2017-05-23 DIAGNOSIS — E119 Type 2 diabetes mellitus without complications: Secondary | ICD-10-CM | POA: Diagnosis not present

## 2017-06-01 DIAGNOSIS — M25561 Pain in right knee: Secondary | ICD-10-CM | POA: Diagnosis not present

## 2017-06-01 DIAGNOSIS — M25461 Effusion, right knee: Secondary | ICD-10-CM | POA: Diagnosis not present

## 2017-06-06 DIAGNOSIS — M25561 Pain in right knee: Secondary | ICD-10-CM | POA: Diagnosis not present

## 2017-06-06 DIAGNOSIS — M1711 Unilateral primary osteoarthritis, right knee: Secondary | ICD-10-CM | POA: Diagnosis not present

## 2017-06-20 ENCOUNTER — Other Ambulatory Visit: Payer: Self-pay | Admitting: Student

## 2017-06-20 DIAGNOSIS — M25561 Pain in right knee: Secondary | ICD-10-CM

## 2017-06-20 DIAGNOSIS — D696 Thrombocytopenia, unspecified: Secondary | ICD-10-CM | POA: Diagnosis not present

## 2017-06-20 DIAGNOSIS — E875 Hyperkalemia: Secondary | ICD-10-CM | POA: Diagnosis not present

## 2017-06-20 DIAGNOSIS — M1711 Unilateral primary osteoarthritis, right knee: Secondary | ICD-10-CM

## 2017-06-27 ENCOUNTER — Ambulatory Visit
Admission: RE | Admit: 2017-06-27 | Discharge: 2017-06-27 | Disposition: A | Discharge status: Home / Self Care | Payer: PPO | Source: Ambulatory Visit | Attending: Student | Admitting: Student

## 2017-06-27 DIAGNOSIS — M25561 Pain in right knee: Secondary | ICD-10-CM

## 2017-06-27 DIAGNOSIS — M1711 Unilateral primary osteoarthritis, right knee: Secondary | ICD-10-CM | POA: Diagnosis not present

## 2017-06-27 DIAGNOSIS — M23221 Derangement of posterior horn of medial meniscus due to old tear or injury, right knee: Secondary | ICD-10-CM | POA: Diagnosis not present

## 2017-06-27 DIAGNOSIS — M25461 Effusion, right knee: Secondary | ICD-10-CM | POA: Diagnosis not present

## 2017-06-27 DIAGNOSIS — S8991XA Unspecified injury of right lower leg, initial encounter: Secondary | ICD-10-CM | POA: Diagnosis not present

## 2017-07-01 DIAGNOSIS — Z96651 Presence of right artificial knee joint: Secondary | ICD-10-CM | POA: Diagnosis not present

## 2017-07-04 ENCOUNTER — Telehealth: Payer: Self-pay | Admitting: Cardiovascular Disease

## 2017-07-04 DIAGNOSIS — M1711 Unilateral primary osteoarthritis, right knee: Secondary | ICD-10-CM | POA: Diagnosis not present

## 2017-07-04 DIAGNOSIS — S83231D Complex tear of medial meniscus, current injury, right knee, subsequent encounter: Secondary | ICD-10-CM | POA: Diagnosis not present

## 2017-07-04 NOTE — Telephone Encounter (Signed)
Pt saw [facility] today to f/u knee MRI. He has elected to proceed w/ partial knee replacement w/ Dr. Joice LoftsPoggi, but DOS has not been scheduled yet.  We have not received [facility], but pt would like to know if he may proceed w/ surgery and if he may hold his Plavix x 4 days prior to procedure, but remain on asa.

## 2017-07-04 NOTE — Telephone Encounter (Signed)
Ok to hold plavix for 5 days if needed Stay on asa 81 Restart plavix after procedure

## 2017-07-04 NOTE — Telephone Encounter (Signed)
Patient has upcoming surgery for knee pending scheduling  Patient wants us to know he is going to hold plavix 4 days prior to procedure but he will continue taking asa

## 2017-07-05 NOTE — Telephone Encounter (Signed)
Received cardiac clearance request for pt to proceed w/ rt partial knee replacement on 07/19/17 w/ Dr. Joice LoftsPoggi.  Dr. Windell HummingbirdGollan's recommendations routed to [facility] @ [phone].

## 2017-07-13 ENCOUNTER — Ambulatory Visit
Admission: RE | Admit: 2017-07-13 | Discharge: 2017-07-13 | Disposition: A | Discharge status: Home / Self Care | Payer: PPO | Source: Ambulatory Visit | Attending: Surgery | Admitting: Surgery

## 2017-07-13 ENCOUNTER — Encounter
Admission: RE | Admit: 2017-07-13 | Discharge: 2017-07-13 | Disposition: A | Discharge status: Home / Self Care | Payer: PPO | Source: Ambulatory Visit | Attending: Surgery | Admitting: Surgery

## 2017-07-13 DIAGNOSIS — X58XXXA Exposure to other specified factors, initial encounter: Secondary | ICD-10-CM | POA: Insufficient documentation

## 2017-07-13 DIAGNOSIS — Z01818 Encounter for other preprocedural examination: Secondary | ICD-10-CM

## 2017-07-13 DIAGNOSIS — S83241A Other tear of medial meniscus, current injury, right knee, initial encounter: Secondary | ICD-10-CM | POA: Insufficient documentation

## 2017-07-13 DIAGNOSIS — Z01812 Encounter for preprocedural laboratory examination: Secondary | ICD-10-CM | POA: Diagnosis not present

## 2017-07-13 DIAGNOSIS — I2510 Atherosclerotic heart disease of native coronary artery without angina pectoris: Secondary | ICD-10-CM

## 2017-07-13 DIAGNOSIS — Z96659 Presence of unspecified artificial knee joint: Secondary | ICD-10-CM | POA: Diagnosis not present

## 2017-07-13 DIAGNOSIS — E119 Type 2 diabetes mellitus without complications: Secondary | ICD-10-CM

## 2017-07-13 DIAGNOSIS — Z0183 Encounter for blood typing: Secondary | ICD-10-CM | POA: Diagnosis not present

## 2017-07-13 DIAGNOSIS — M1711 Unilateral primary osteoarthritis, right knee: Secondary | ICD-10-CM | POA: Insufficient documentation

## 2017-07-13 DIAGNOSIS — I10 Essential (primary) hypertension: Secondary | ICD-10-CM | POA: Diagnosis not present

## 2017-07-13 HISTORY — DX: Depression, unspecified: F32.A

## 2017-07-13 HISTORY — DX: Major depressive disorder, single episode, unspecified: F32.9

## 2017-07-13 LAB — BASIC METABOLIC PANEL
ANION GAP: 10 (ref 5–15)
BUN: 10 mg/dL (ref 6–20)
CALCIUM: 8.9 mg/dL (ref 8.9–10.3)
CO2: 25 mmol/L (ref 22–32)
Chloride: 105 mmol/L (ref 101–111)
Creatinine, Ser: 1.14 mg/dL (ref 0.61–1.24)
GFR calc Af Amer: >60 mL/min (ref >60)
Glucose, Bld: 90 mg/dL (ref 65–99)
POTASSIUM: 3.9 mmol/L (ref 3.5–5.1)
SODIUM: 140 mmol/L (ref 135–145)

## 2017-07-13 LAB — SURGICAL PCR SCREEN
MRSA, PCR: NEGATIVE
Staphylococcus aureus: NEGATIVE

## 2017-07-13 LAB — URINALYSIS, ROUTINE W REFLEX MICROSCOPIC
BACTERIA UA: NONE SEEN
Bilirubin Urine: NEGATIVE
GLUCOSE, UA: NEGATIVE mg/dL
HGB URINE DIPSTICK: NEGATIVE
Ketones, ur: NEGATIVE mg/dL
Leukocytes, UA: NEGATIVE
NITRITE: NEGATIVE
PROTEIN: 30 mg/dL — AB
RBC / HPF: NONE SEEN RBC/hpf (ref 0–5)
SPECIFIC GRAVITY, URINE: 1.025 (ref 1.005–1.030)
Squamous Epithelial / LPF: NONE SEEN
pH: 5 (ref 5.0–8.0)

## 2017-07-13 LAB — CBC
HCT: 45.6 % (ref 40.0–52.0)
Hemoglobin: 15.3 g/dL (ref 13.0–18.0)
MCH: 29.8 pg (ref 26.0–34.0)
MCHC: 33.5 g/dL (ref 32.0–36.0)
MCV: 89 fL (ref 80.0–100.0)
PLATELETS: 144 10*3/uL — AB (ref 150–440)
RBC: 5.12 MIL/uL (ref 4.40–5.90)
RDW: 14.6 % — AB (ref 11.5–14.5)
WBC: 9 10*3/uL (ref 3.8–10.6)

## 2017-07-13 LAB — PROTIME-INR
INR: 1.14
PROTHROMBIN TIME: 14.7 s (ref 11.4–15.2)

## 2017-07-13 LAB — TYPE AND SCREEN
ABO/RH(D): A POS
ANTIBODY SCREEN: NEGATIVE

## 2017-07-13 NOTE — Pre-Procedure Instructions (Signed)
[name], [name] - 07/04/2017 11:00 AM EDT Formatting of this note may be different from the original. Chief Complaint: Chief Complaint  Patient presents with  . Follow-up  MRI results   Doc Mandala is a 61 y.o. male who presents today for repeat evaluation of right knee pain. The patient has been evaluated in the past and diagnosed of right knee osteoarthritis however at his last visit the patient had a right knee injury in which she was getting out of a car when he twisted his right knee and felt and heard a pop. Patient felt extreme pain and had to stand still for several minutes before he was able to continue moving. X-rays were negative for any acute fracture, in the past he was diagnosed with significant medial compartment osteoarthritis. The pain was located along the medial joint line, thus an MRI was obtained for evaluation of possible meniscal tear. He presents today with 6 out of 10 right knee pain, continues to have only pain along the medial aspect of the right knee. He complains of this pain is a sharp sensation with constant aching quality. Patient denies any personal history of asthma or COPD, he does have a history of MI requiring cardiac bypass surgery. Patient does not have any history of blood clots.  Past Medical History: Past Medical History:  Diagnosis Date  . Anxiety, unspecified  . Atypical angina (CMS-HCC)  . Barrett's esophagus  . Bulging disc  in neck  . CAD (coronary artery disease) 02/08/2013  . Chronic constipation  . Colon polyp  . DM (diabetes mellitus) (CMS-HCC) 02/08/2013  . GERD (gastroesophageal reflux disease) 02/08/2013  . H/O esophageal spasm  . HLD (hyperlipidemia), unspecified 02/08/2013  . Hypertension  . Sleep apnea   Past Surgical History: Past Surgical History:  Procedure Laterality Date  . Abscess/cellulitis resection, posterior neck  . Anal fissure repair  . COLONOSCOPY 06/17/2005, 08/23/2013  . COLONOSCOPY  . CORONARY ARTERY  BYPASS W/VENOUS & ARTERIAL GRAFTS 02/14/2013  LIMA-LAD, SVG-OM1  . CORONARY ARTERY BYPASS W/VENOUS & ARTERIAL GRAFTS N/A 02/14/2013  Procedure: CABG, ARTERY-VEIN, THREE; Surgeon: Heloise Ochoa, MD; Location: [facility]; Service: Cardiothoracic; Laterality: N/A; median sternotomy  . EGD 02/09/2006, 10/22/2009, 06/17/2010, 08/23/2013  . HARVEST LEFT INTERNAL MAMMARY ARTERY FOR CABG N/A 02/14/2013  Procedure: HARVEST LEFT INTERNAL MAMMARY ARTERY FOR CABG; Surgeon: Heloise Ochoa, MD; Location: [facility]; Service: Cardiothoracic; Laterality: N/A;  . POLYPECTOMY  . TRANSESOPHAGEAL ECHOCARDIOGRAPHY N/A 02/14/2013  Procedure: TRANSESOPHAGEAL ECHOCARDIOGRAPHY; Surgeon: Heloise Ochoa, MD; Location: [facility]; Service: Cardiothoracic; Laterality: N/A; performed by anesthesiologist  . UPPER GASTROINTESTINAL ENDOSCOPY   Past Family History: Family History  Problem Relation Age of Onset  . No Known Problems Mother  . COPD Father  . High blood pressure (Hypertension) Maternal Grandmother  . Stroke Maternal Grandfather  . Prostate cancer Maternal Grandfather  . Goiter Maternal Aunt  . Thyroid disease Maternal Aunt  . Esophageal cancer Maternal Aunt  . Colon cancer Maternal Uncle  . Gout Paternal Aunt  . Colon cancer Paternal Aunt  . Kidney failure Paternal Aunt  . Cancer Paternal Uncle  . Ovarian cancer Paternal Grandmother  . Diabetes type II Maternal Aunt  . Leukemia Maternal Aunt   Medications: Current Outpatient Prescriptions Ordered in Epic  Medication Sig Dispense Refill  . ALPRAZolam (XANAX) 0.5 MG tablet TAKE 1/2 TO 1 TABLET BY MOUTH EVERY 8 TO 12 HOURS AS NEEDED (NOT FOR DAILY USE) 30 tablet 0  . aspirin 81  MG EC tablet Take 81 mg by mouth daily.  Marland Kitchen atorvastatin (LIPITOR) 40 MG tablet TAKE 1 TABLET (40 MG TOTAL) BY MOUTH NIGHTLY. 30 tablet 6  . buPROPion (WELLBUTRIN SR) 150 MG SR tablet TAKE 1 TABLET (150 MG TOTAL) BY MOUTH 2 (TWO) TIMES  DAILY. 60 tablet 5  . clopidogrel (PLAVIX) 75 mg tablet TAKE 1 TABLET (75 MG TOTAL) BY MOUTH ONCE DAILY. 30 tablet 2  . cyanocobalamin (VITAMIN B12) 1000 MCG tablet Take 1,000 mcg by mouth once daily.  . diphenhydramine-acetaminophen (TYLENOL PM) 25-500 mg per tablet Take by mouth. Take 1 tablet by mouth at bedtime as needed.  . ezetimibe (ZETIA) 10 mg tablet Take by mouth. Take 1 tablet (10 mg total) by mouth daily.  . fluticasone-salmeterol (ADVAIR HFA) 115-21 mcg/actuation inhaler Inhale 2 inhalations into the lungs every 12 (twelve) hours. 1 Inhaler 6  . FUROsemide (LASIX) 20 MG tablet Take 20 mg by mouth. Takes M,W,F  . gabapentin (NEURONTIN) 300 MG capsule Take by mouth. Take 300 mg by mouth daily.  Marland Kitchen HYDROcodone-acetaminophen (NORCO) 5-325 mg tablet Take 1 tablet by mouth every 8 (eight) hours as needed for Pain. 15 tablet 0  . ibuprofen (ADVIL,MOTRIN) 200 MG tablet Take 200 mg by mouth as needed for Pain.  . metFORMIN (GLUCOPHAGE) 850 MG tablet TAKE 1 TABLET (850 MG TOTAL) BY MOUTH 2 (TWO) TIMES DAILY WITH MEALS. 60 tablet 2  . metoprolol tartrate (LOPRESSOR) 25 MG tablet TAKE 1/2 TABLET BY MOUTH TWICE A DAY 30 tablet 3  . midodrine (PROAMATINE) 10 MG tablet Take by mouth. Take 10 mg by mouth every morning.  . nitroGLYcerin (NITROSTAT) 0.4 MG SL tablet Place 1 tablet (0.4 mg total) under the tongue every 5 (five) minutes as needed for Chest pain. May take up to 3 doses. 25 tablet 1  . RABEprazole (ACIPHEX) 20 mg EC tablet TAKE 1 TABLET (20 MG TOTAL) BY MOUTH ONCE DAILY. 90 tablet 1  . ranitidine (ZANTAC) 150 MG capsule Take by mouth. Take 150 mg by mouth daily.  . tamsulosin (FLOMAX) 0.4 mg capsule Take 0.4 mg by mouth once daily.   Marland Kitchen venlafaxine (EFFEXOR-XR) 75 MG XR capsule Take 3 capsules (225 mg total) by mouth once daily. 90 capsule 2  . VENTOLIN HFA 90 mcg/actuation inhaler INHALE 2 PUFFS EVERY 6 HOURS AS NEEDED 3 Inhaler 3  . traMADol (ULTRAM) 50 mg tablet Take 1 tablet (50 mg  total) by mouth every 8 (eight) hours as needed for Pain. 15 tablet 0   No current Epic-ordered facility-administered medications on file.   Allergies: No Known Allergies   Review of Systems:  A comprehensive 14 point ROS was performed, reviewed by me today, and the pertinent orthopaedic findings are documented in the HPI.  Exam: BP 120/72  Ht 174 cm (5' 8.5")  Wt (!) 122.5 kg (270 lb)  BMI 40.46 kg/m  General/Constitutional: The patient appears to be well-nourished, well-developed, and in no acute distress. Neuro/Psych: Normal mood and affect, oriented to person, place and time. Eyes: Non-icteric. Pupils are equal, round, and reactive to light, and exhibit synchronous movement. ENT: Unremarkable. Lymphatic: No palpable adenopathy. Respiratory: Lungs clear to auscultation, Normal chest excursion, No wheezes and Non-labored breathing Cardiovascular: Regular rate and rhythm. No murmurs. and No edema, swelling or tenderness, except as noted in detailed exam. Integumentary: No impressive skin lesions present, except as noted in detailed exam. Musculoskeletal: Unremarkable, except as noted in detailed exam.  General: Well developed, well nourished 61 y.o. male  in no apparent distress. Normal affect. Normal communication. Patient answers questions appropriately. The patient has a normal gait. There is no antalgic component. There is no hip lurch.   Right Lower Extremities: Examination of the right lower extremity reveals no bony abnormality, no edema, mild effusion and no ecchymosis. There is no valgus or varus abnormality. The patient is non-tender along the lateral joint line, and is moderately tender along the medial joint line. Patient has pain at the extremes of knee extension and he is able to flex 100 with moderate discomfort. The patient has a positive rotational Mcmurray test. There is mild retropatellar discomfort. The patient has a negative patella stretch test. The patient has a  negative varus stress test and a negative valgus stress test, in looking for stability. The patient has a negative Lachman's test.  Vascular: The patient has a negative Denna Haggard' test bilaterally. The patient had a normal dorsalis pedis and posterior tibial pulse. There is normal skin warmth. There is normal capillary refill bilaterally.   Neurologic: The patient has a negative straight leg raise. The patient has normal muscle strength testing for the quadriceps, calves, ankle dorsiflexion, ankle plantarflexion, and extensor hallicus longus. The patient has sensation that is intact to light touch. The deep tendon reflexes are normal at the patella and achilles. No clonus is noted.   Imaging: X-rays obtained in the [facility] clinic are detailed in the HPI above.  MRI of the right knee was obtained on 06/27/17. This MRI demonstrates a complete radial tear to the root of the posterior horn with extensive degenerative signal seen throughout the posterior horn extending to the body of the medial meniscus. There is mucoid degeneration of the ACL PCL were some ACL, a 0.7 x 0.8 x 0.7 cm cyst just medial to the ACL attachment to the tibia is consistent with a ganglion cyst. Ligaments are intact, collaterals are intact. There is moderate degeneration along the medial compartment of the right knee, thinning and irregularity most notable at the apex of patella superior pole of the right knee and the lateral joint space appears relatively preserved.  Impression: Primary osteoarthritis of right knee [M17.11] Primary osteoarthritis of right knee (primary encounter diagnosis) Complex tear of medial meniscus of right knee as current injury, subsequent encounter  Plan:  1. Treatment options were discussed today with the patient. 2. Spoke with the patient about his right knee MRI, I informed him that he was scheduled for a knee arthroscopy I do not believe that he would have much relief. I discussed both  a total knee replacement and partially replaced with him, after discussing the risks and benefits of both, he is elected to proceed with a partial knee replacement to be performed by Dr. Joice Lofts. 3. This document will serve as a surgical history and physical for the patient. 4. The patient will follow-up per standard postop protocol. They can call the clinic they have any questions, new symptoms develop or symptoms worsen.  The procedure was discussed with the patient, as were the potential risks (including bleeding, infection, nerve and/or blood vessel injury, persistent or recurrent pain, failure of the repair, progression of arthritis, need for further surgery, blood clots, strokes, heart attacks and/or arhythmias, pneumonia, etc.) and benefits. The patient states his understanding and wishes to proceed.  This note was generated in part with voice recognition software and I apologize for any typographical errors that were not detected and corrected.  Valeria Batman, PA-C [facility]

## 2017-07-13 NOTE — Patient Instructions (Addendum)
  Your procedure is scheduled [id number] August 28 , 2018. Report to Same Day Surgery. To find out your arrival time please call [phone] between 1PM - 3PM on Monday July 18, 2017  Remember: Instructions that are not followed completely may result in serious medical risk, up to and including death, or upon the discretion of your surgeon and anesthesiologist your surgery may need to be rescheduled.    _x___ 1. Do not eat food or drink liquids after midnight. No gum chewing or hard candies.     ____ 2. No Alcohol for 24 hours before or after surgery.   ____ 3. Bring all medications with you on the day of surgery if instructed.    __x__ 4. Notify your doctor if there is any change in your medical condition     (cold, fever, infections).    _____ 5. No smoking 24 hours prior to surgery.     Do not wear jewelry, make-up, hairpins, clips or nail polish.  Do not wear lotions, powders, or perfumes.   Do not shave 48 hours prior to surgery. Men may shave face and neck.  Do not bring valuables to the hospital.    [facility] is not responsible for any belongings or valuables.               Contacts, dentures or bridgework may not be worn into surgery.  Leave your suitcase in the car. After surgery it may be brought to your room.  For patients admitted to the hospital, discharge time is determined by your treatment team.   Patients discharged the day of surgery will not be allowed to drive home.    Please read over the following fact sheets that you were given:   [facility] Preparing for Surgery  __x__ Take these medicines the morning of surgery with A SIP OF WATER:    1. buPROPion (WELLBUTRIN SR)   2. ezetimibe (ZETIA)  3. metoprolol tartrate (LOPRESSOR)  4.  ranitidine (ZANTAC)   5.  venlafaxine XR (EFFEXOR-XR)  6. midodrine (PROAMATINE)    ____ Fleet Enema (as directed)   __x__ Use CHG Soap as directed on instruction sheet  __x__ Use inhalers on the day of surgery and  bring to hospital day of surgery  __x__ Stop metformin 2 days prior to surgery    ____ Take 1/2 of usual insulin dose the night before surgery and none on the morning of surgery.   _x___ Stop Plavix 5 days prior to surgery and continue aspirin per Dr. Windell Hummingbird instructions.  __x__ Stop Anti-inflammatories such as Advil, Aleve, Ibuprofen, Motrin, Naproxen, Naprosyn, Goodies powders or aspirin  products. OK to take Tylenol or traMADol (ULTRAM) .   ____ Stop supplements until after surgery.    ____ Bring C-Pap to the hospital.

## 2017-07-14 NOTE — Pre-Procedure Instructions (Signed)
UA FAXED TO DR [facility] OFFICE

## 2017-07-19 ENCOUNTER — Inpatient Hospital Stay: Payer: PPO

## 2017-07-19 ENCOUNTER — Inpatient Hospital Stay: Payer: PPO | Admitting: Anesthesiology

## 2017-07-19 ENCOUNTER — Encounter
Admission: RE | Disposition: A | Discharge status: Home / Self Care | Payer: Self-pay | Source: Ambulatory Visit | Attending: Surgery

## 2017-07-19 ENCOUNTER — Inpatient Hospital Stay
Admission: RE | Admit: 2017-07-19 | Discharge: 2017-07-21 | DRG: 470 | Disposition: A | Discharge status: Home / Self Care | Payer: PPO | Source: Ambulatory Visit | Attending: Surgery | Admitting: Surgery

## 2017-07-19 DIAGNOSIS — G4730 Sleep apnea, unspecified: Secondary | ICD-10-CM | POA: Diagnosis not present

## 2017-07-19 DIAGNOSIS — K2270 Barrett's esophagus without dysplasia: Secondary | ICD-10-CM | POA: Diagnosis not present

## 2017-07-19 DIAGNOSIS — S83231A Complex tear of medial meniscus, current injury, right knee, initial encounter: Secondary | ICD-10-CM | POA: Diagnosis not present

## 2017-07-19 DIAGNOSIS — M62838 Other muscle spasm: Secondary | ICD-10-CM | POA: Diagnosis not present

## 2017-07-19 DIAGNOSIS — I10 Essential (primary) hypertension: Secondary | ICD-10-CM | POA: Diagnosis not present

## 2017-07-19 DIAGNOSIS — I252 Old myocardial infarction: Secondary | ICD-10-CM

## 2017-07-19 DIAGNOSIS — M1711 Unilateral primary osteoarthritis, right knee: Principal | ICD-10-CM | POA: Diagnosis present

## 2017-07-19 DIAGNOSIS — Z6838 Body mass index (BMI) 38.0-38.9, adult: Secondary | ICD-10-CM | POA: Diagnosis not present

## 2017-07-19 DIAGNOSIS — F329 Major depressive disorder, single episode, unspecified: Secondary | ICD-10-CM | POA: Diagnosis present

## 2017-07-19 DIAGNOSIS — E119 Type 2 diabetes mellitus without complications: Secondary | ICD-10-CM | POA: Diagnosis not present

## 2017-07-19 DIAGNOSIS — E785 Hyperlipidemia, unspecified: Secondary | ICD-10-CM | POA: Diagnosis not present

## 2017-07-19 DIAGNOSIS — Z96651 Presence of right artificial knee joint: Secondary | ICD-10-CM | POA: Diagnosis not present

## 2017-07-19 DIAGNOSIS — Z79899 Other long term (current) drug therapy: Secondary | ICD-10-CM | POA: Diagnosis not present

## 2017-07-19 DIAGNOSIS — M23203 Derangement of unspecified medial meniscus due to old tear or injury, right knee: Secondary | ICD-10-CM | POA: Diagnosis not present

## 2017-07-19 DIAGNOSIS — K219 Gastro-esophageal reflux disease without esophagitis: Secondary | ICD-10-CM | POA: Diagnosis present

## 2017-07-19 DIAGNOSIS — I2510 Atherosclerotic heart disease of native coronary artery without angina pectoris: Secondary | ICD-10-CM | POA: Diagnosis not present

## 2017-07-19 DIAGNOSIS — Z7984 Long term (current) use of oral hypoglycemic drugs: Secondary | ICD-10-CM

## 2017-07-19 DIAGNOSIS — F419 Anxiety disorder, unspecified: Secondary | ICD-10-CM | POA: Diagnosis not present

## 2017-07-19 DIAGNOSIS — E669 Obesity, unspecified: Secondary | ICD-10-CM | POA: Diagnosis not present

## 2017-07-19 DIAGNOSIS — Z951 Presence of aortocoronary bypass graft: Secondary | ICD-10-CM

## 2017-07-19 DIAGNOSIS — M25561 Pain in right knee: Secondary | ICD-10-CM | POA: Diagnosis present

## 2017-07-19 DIAGNOSIS — Z471 Aftercare following joint replacement surgery: Secondary | ICD-10-CM | POA: Diagnosis not present

## 2017-07-19 HISTORY — PX: PARTIAL KNEE ARTHROPLASTY: SHX2174

## 2017-07-19 LAB — ABO/RH: ABO/RH(D): A POS

## 2017-07-19 LAB — GLUCOSE, CAPILLARY
GLUCOSE-CAPILLARY: 100 mg/dL — AB (ref 65–99)
GLUCOSE-CAPILLARY: 99 mg/dL (ref 65–99)

## 2017-07-19 SURGERY — ARTHROPLASTY, KNEE, UNICOMPARTMENTAL
Anesthesia: Spinal | Site: Knee | Laterality: Right | Wound class: Clean

## 2017-07-19 MED ORDER — HYDROMORPHONE HCL 1 MG/ML IJ SOLN
1.0000 mg | INTRAMUSCULAR | Status: DC | PRN
  Administered 2017-07-20 – 2017-07-21 (×4): 1 mg via INTRAVENOUS
  Filled 2017-07-19 (×4): qty 1

## 2017-07-19 MED ORDER — SODIUM CHLORIDE 0.9 % IV SOLN
INTRAVENOUS | Status: DC | PRN
  Administered 2017-07-19: 60 mL

## 2017-07-19 MED ORDER — BUPIVACAINE LIPOSOME 1.3 % IJ SUSP
INTRAMUSCULAR | Status: AC
  Filled 2017-07-19: qty 20

## 2017-07-19 MED ORDER — MEPERIDINE HCL 50 MG/ML IJ SOLN: 6.2500 mg | INTRAMUSCULAR | Status: DC | PRN

## 2017-07-19 MED ORDER — PROPOFOL 10 MG/ML IV BOLUS
INTRAVENOUS | Status: DC | PRN
  Administered 2017-07-19: 18 mg via INTRAVENOUS

## 2017-07-19 MED ORDER — KETAMINE HCL 50 MG/ML IJ SOLN
INTRAMUSCULAR | Status: DC | PRN
  Administered 2017-07-19: 1.8 mg via INTRAMUSCULAR

## 2017-07-19 MED ORDER — FENTANYL CITRATE (PF) 100 MCG/2ML IJ SOLN: 25.0000 ug | INTRAMUSCULAR | Status: DC | PRN

## 2017-07-19 MED ORDER — KETAMINE HCL 100 MG/ML IJ SOLN
INTRAMUSCULAR | Status: DC | PRN
  Administered 2017-07-19: 6 ug/kg/min via INTRAVENOUS

## 2017-07-19 MED ORDER — ALBUTEROL SULFATE (2.5 MG/3ML) 0.083% IN NEBU: 2.5000 mg | INHALATION_SOLUTION | Freq: Four times a day (QID) | RESPIRATORY_TRACT | Status: DC | PRN

## 2017-07-19 MED ORDER — TAMSULOSIN HCL 0.4 MG PO CAPS
0.4000 mg | ORAL_CAPSULE | Freq: Every day | ORAL | Status: DC
  Administered 2017-07-20 – 2017-07-21 (×2): 0.4 mg via ORAL
  Filled 2017-07-19 (×2): qty 1

## 2017-07-19 MED ORDER — BISACODYL 10 MG RE SUPP: 10.0000 mg | Freq: Every day | RECTAL | Status: DC | PRN

## 2017-07-19 MED ORDER — NEOMYCIN-POLYMYXIN B GU 40-200000 IR SOLN
Status: AC
  Filled 2017-07-19: qty 20

## 2017-07-19 MED ORDER — TRANEXAMIC ACID 1000 MG/10ML IV SOLN
INTRAVENOUS | Status: AC | PRN
  Administered 2017-07-19: 1000 mg via TOPICAL

## 2017-07-19 MED ORDER — LIDOCAINE HCL (PF) 2 % IJ SOLN
INTRAMUSCULAR | Status: AC
  Filled 2017-07-19: qty 2

## 2017-07-19 MED ORDER — ACETAMINOPHEN 10 MG/ML IV SOLN
INTRAVENOUS | Status: AC
  Filled 2017-07-19: qty 100

## 2017-07-19 MED ORDER — MIDODRINE HCL 5 MG PO TABS
10.0000 mg | ORAL_TABLET | Freq: Every morning | ORAL | Status: DC
  Administered 2017-07-20 – 2017-07-21 (×2): 10 mg via ORAL
  Filled 2017-07-19 (×2): qty 2

## 2017-07-19 MED ORDER — FLEET ENEMA 7-19 GM/118ML RE ENEM: 1.0000 | ENEMA | Freq: Once | RECTAL | Status: DC | PRN

## 2017-07-19 MED ORDER — KETOROLAC TROMETHAMINE 15 MG/ML IJ SOLN
30.0000 mg | Freq: Once | INTRAMUSCULAR | Status: DC
  Filled 2017-07-19: qty 2

## 2017-07-19 MED ORDER — PHENYLEPHRINE HCL 10 MG/ML IJ SOLN
INTRAMUSCULAR | Status: AC
  Filled 2017-07-19: qty 1

## 2017-07-19 MED ORDER — OXYCODONE HCL 5 MG PO TABS
5.0000 mg | ORAL_TABLET | ORAL | Status: DC | PRN
  Administered 2017-07-20 – 2017-07-21 (×9): 10 mg via ORAL
  Filled 2017-07-19 (×5): qty 2
  Filled 2017-07-19: qty 1
  Filled 2017-07-19 (×3): qty 2
  Filled 2017-07-19: qty 1

## 2017-07-19 MED ORDER — PANTOPRAZOLE SODIUM 40 MG PO TBEC
40.0000 mg | DELAYED_RELEASE_TABLET | Freq: Every day | ORAL | Status: DC
  Administered 2017-07-20 – 2017-07-21 (×2): 40 mg via ORAL
  Filled 2017-07-19 (×2): qty 1

## 2017-07-19 MED ORDER — FLUDROCORTISONE ACETATE 0.1 MG PO TABS
0.1000 mg | ORAL_TABLET | ORAL | Status: DC
  Filled 2017-07-19: qty 1

## 2017-07-19 MED ORDER — TRANEXAMIC ACID 1000 MG/10ML IV SOLN
INTRAVENOUS | Status: AC
  Filled 2017-07-19: qty 10

## 2017-07-19 MED ORDER — ACETAMINOPHEN 10 MG/ML IV SOLN
INTRAVENOUS | Status: DC | PRN
  Administered 2017-07-19: 1000 mg via INTRAVENOUS

## 2017-07-19 MED ORDER — EZETIMIBE 10 MG PO TABS
10.0000 mg | ORAL_TABLET | Freq: Every day | ORAL | Status: DC
  Administered 2017-07-20 – 2017-07-21 (×2): 10 mg via ORAL
  Filled 2017-07-19 (×2): qty 1

## 2017-07-19 MED ORDER — ACETAMINOPHEN 500 MG PO TABS
1000.0000 mg | ORAL_TABLET | Freq: Four times a day (QID) | ORAL | Status: AC
  Administered 2017-07-19 – 2017-07-20 (×4): 1000 mg via ORAL
  Filled 2017-07-19 (×4): qty 2

## 2017-07-19 MED ORDER — VENLAFAXINE HCL ER 75 MG PO CP24
225.0000 mg | ORAL_CAPSULE | ORAL | Status: DC
  Administered 2017-07-20 – 2017-07-21 (×2): 225 mg via ORAL
  Filled 2017-07-19 (×4): qty 3

## 2017-07-19 MED ORDER — DIPHENHYDRAMINE HCL 12.5 MG/5ML PO ELIX: 12.5000 mg | ORAL_SOLUTION | ORAL | Status: DC | PRN

## 2017-07-19 MED ORDER — PROPOFOL 500 MG/50ML IV EMUL
INTRAVENOUS | Status: AC
  Filled 2017-07-19: qty 50

## 2017-07-19 MED ORDER — PROMETHAZINE HCL 25 MG/ML IJ SOLN: 6.2500 mg | INTRAMUSCULAR | Status: DC | PRN

## 2017-07-19 MED ORDER — METFORMIN HCL 850 MG PO TABS
850.0000 mg | ORAL_TABLET | Freq: Every day | ORAL | Status: DC
  Administered 2017-07-20 – 2017-07-21 (×2): 850 mg via ORAL
  Filled 2017-07-19 (×2): qty 1

## 2017-07-19 MED ORDER — SODIUM CHLORIDE 0.9 % IV SOLN
INTRAVENOUS | Status: DC | PRN
  Administered 2017-07-19 (×2): via INTRAVENOUS

## 2017-07-19 MED ORDER — MAGNESIUM HYDROXIDE 400 MG/5ML PO SUSP
30.0000 mL | Freq: Every day | ORAL | Status: DC | PRN
  Administered 2017-07-21: 30 mL via ORAL
  Filled 2017-07-19: qty 30

## 2017-07-19 MED ORDER — BUPIVACAINE-EPINEPHRINE (PF) 0.5% -1:200000 IJ SOLN
INTRAMUSCULAR | Status: DC | PRN
  Administered 2017-07-19: 30 mL via PERINEURAL

## 2017-07-19 MED ORDER — DOCUSATE SODIUM 100 MG PO CAPS
100.0000 mg | ORAL_CAPSULE | Freq: Two times a day (BID) | ORAL | Status: DC
  Administered 2017-07-19 – 2017-07-21 (×4): 100 mg via ORAL
  Filled 2017-07-19 (×4): qty 1

## 2017-07-19 MED ORDER — ONDANSETRON HCL 4 MG/2ML IJ SOLN: 4.0000 mg | Freq: Four times a day (QID) | INTRAMUSCULAR | Status: DC | PRN

## 2017-07-19 MED ORDER — ATORVASTATIN CALCIUM 20 MG PO TABS
40.0000 mg | ORAL_TABLET | Freq: Every day | ORAL | Status: DC
  Administered 2017-07-19 – 2017-07-20 (×2): 40 mg via ORAL
  Filled 2017-07-19 (×2): qty 2

## 2017-07-19 MED ORDER — CLOPIDOGREL BISULFATE 75 MG PO TABS
75.0000 mg | ORAL_TABLET | Freq: Every day | ORAL | Status: DC
  Administered 2017-07-20 – 2017-07-21 (×2): 75 mg via ORAL
  Filled 2017-07-19 (×2): qty 1

## 2017-07-19 MED ORDER — DEXAMETHASONE SODIUM PHOSPHATE 10 MG/ML IJ SOLN
INTRAMUSCULAR | Status: AC
  Filled 2017-07-19: qty 1

## 2017-07-19 MED ORDER — DEXTROSE 5 % IV SOLN
3.0000 g | Freq: Four times a day (QID) | INTRAVENOUS | Status: AC
  Administered 2017-07-19 – 2017-07-20 (×3): 3 g via INTRAVENOUS
  Filled 2017-07-19 (×3): qty 3000

## 2017-07-19 MED ORDER — KETOROLAC TROMETHAMINE 30 MG/ML IJ SOLN
INTRAMUSCULAR | Status: AC
  Administered 2017-07-19: 30 mg
  Filled 2017-07-19: qty 1

## 2017-07-19 MED ORDER — ALPRAZOLAM 0.25 MG PO TABS
0.2500 mg | ORAL_TABLET | Freq: Every day | ORAL | Status: DC
  Administered 2017-07-19 – 2017-07-20 (×2): 0.25 mg via ORAL
  Filled 2017-07-19 (×2): qty 1

## 2017-07-19 MED ORDER — ASPIRIN EC 81 MG PO TBEC
81.0000 mg | DELAYED_RELEASE_TABLET | Freq: Every day | ORAL | Status: DC
  Administered 2017-07-20 – 2017-07-21 (×2): 81 mg via ORAL
  Filled 2017-07-19 (×2): qty 1

## 2017-07-19 MED ORDER — ACETAMINOPHEN 325 MG PO TABS
650.0000 mg | ORAL_TABLET | Freq: Four times a day (QID) | ORAL | Status: DC | PRN
  Administered 2017-07-21: 650 mg via ORAL
  Filled 2017-07-19: qty 2

## 2017-07-19 MED ORDER — NEOMYCIN-POLYMYXIN B GU 40-200000 IR SOLN
Status: DC | PRN
  Administered 2017-07-19: 14 mL

## 2017-07-19 MED ORDER — KETAMINE HCL 50 MG/ML IJ SOLN
INTRAMUSCULAR | Status: AC
  Filled 2017-07-19: qty 10

## 2017-07-19 MED ORDER — DEXTROSE 5 % IV SOLN
3.0000 g | Freq: Once | INTRAVENOUS | Status: AC
  Administered 2017-07-19: 3 g via INTRAVENOUS
  Filled 2017-07-19: qty 3000

## 2017-07-19 MED ORDER — ALBUTEROL SULFATE HFA 108 (90 BASE) MCG/ACT IN AERS: 1.0000 | INHALATION_SPRAY | Freq: Four times a day (QID) | RESPIRATORY_TRACT | Status: DC | PRN

## 2017-07-19 MED ORDER — METOPROLOL TARTRATE 25 MG PO TABS
12.5000 mg | ORAL_TABLET | Freq: Two times a day (BID) | ORAL | Status: DC
  Administered 2017-07-19 – 2017-07-21 (×4): 12.5 mg via ORAL
  Filled 2017-07-19 (×4): qty 1

## 2017-07-19 MED ORDER — SODIUM CHLORIDE 0.9 % IJ SOLN
INTRAMUSCULAR | Status: AC
  Filled 2017-07-19: qty 50

## 2017-07-19 MED ORDER — BUPROPION HCL ER (SR) 150 MG PO TB12
150.0000 mg | ORAL_TABLET | Freq: Two times a day (BID) | ORAL | Status: DC
  Administered 2017-07-19 – 2017-07-21 (×4): 150 mg via ORAL
  Filled 2017-07-19 (×6): qty 1

## 2017-07-19 MED ORDER — POTASSIUM CHLORIDE IN NACL 20-0.9 MEQ/L-% IV SOLN
INTRAVENOUS | Status: DC
  Administered 2017-07-19 – 2017-07-20 (×2): via INTRAVENOUS
  Filled 2017-07-19 (×7): qty 1000

## 2017-07-19 MED ORDER — BUPIVACAINE-EPINEPHRINE (PF) 0.5% -1:200000 IJ SOLN
INTRAMUSCULAR | Status: AC
  Filled 2017-07-19: qty 30

## 2017-07-19 MED ORDER — SODIUM CHLORIDE 0.9 % IV SOLN: INTRAVENOUS | Status: DC

## 2017-07-19 MED ORDER — OXYCODONE HCL 5 MG PO TABS: 5.0000 mg | ORAL_TABLET | Freq: Once | ORAL | Status: DC | PRN

## 2017-07-19 MED ORDER — ACETAMINOPHEN 650 MG RE SUPP: 650.0000 mg | Freq: Four times a day (QID) | RECTAL | Status: DC | PRN

## 2017-07-19 MED ORDER — ENOXAPARIN SODIUM 40 MG/0.4ML ~~LOC~~ SOLN
40.0000 mg | SUBCUTANEOUS | Status: DC
  Administered 2017-07-20 – 2017-07-21 (×2): 40 mg via SUBCUTANEOUS
  Filled 2017-07-19 (×2): qty 0.4

## 2017-07-19 MED ORDER — GABAPENTIN 300 MG PO CAPS
300.0000 mg | ORAL_CAPSULE | Freq: Every day | ORAL | Status: DC
  Administered 2017-07-20 – 2017-07-21 (×2): 300 mg via ORAL
  Filled 2017-07-19 (×2): qty 1

## 2017-07-19 MED ORDER — PROPOFOL 500 MG/50ML IV EMUL
INTRAVENOUS | Status: DC | PRN
  Administered 2017-07-19: 60 ug/kg/min via INTRAVENOUS

## 2017-07-19 MED ORDER — METOCLOPRAMIDE HCL 5 MG/ML IJ SOLN: 5.0000 mg | Freq: Three times a day (TID) | INTRAMUSCULAR | Status: DC | PRN

## 2017-07-19 MED ORDER — FENTANYL CITRATE (PF) 100 MCG/2ML IJ SOLN
INTRAMUSCULAR | Status: DC | PRN
  Administered 2017-07-19: 50 ug via INTRAVENOUS

## 2017-07-19 MED ORDER — MIDAZOLAM HCL 2 MG/2ML IJ SOLN
INTRAMUSCULAR | Status: AC
  Filled 2017-07-19: qty 2

## 2017-07-19 MED ORDER — NITROGLYCERIN 0.4 MG SL SUBL: 0.4000 mg | SUBLINGUAL_TABLET | SUBLINGUAL | Status: DC | PRN

## 2017-07-19 MED ORDER — SODIUM CHLORIDE 0.9 % IV SOLN
INTRAVENOUS | Status: DC | PRN
  Administered 2017-07-19: 20 ug/min via INTRAVENOUS

## 2017-07-19 MED ORDER — BUPIVACAINE HCL (PF) 0.5 % IJ SOLN
INTRAMUSCULAR | Status: DC | PRN
  Administered 2017-07-19: 3 mL

## 2017-07-19 MED ORDER — KETOROLAC TROMETHAMINE 15 MG/ML IJ SOLN
15.0000 mg | Freq: Four times a day (QID) | INTRAMUSCULAR | Status: AC
  Administered 2017-07-19 – 2017-07-20 (×4): 15 mg via INTRAVENOUS
  Filled 2017-07-19 (×4): qty 1

## 2017-07-19 MED ORDER — MOMETASONE FURO-FORMOTEROL FUM 200-5 MCG/ACT IN AERO
2.0000 | INHALATION_SPRAY | Freq: Two times a day (BID) | RESPIRATORY_TRACT | Status: DC
  Administered 2017-07-19 – 2017-07-21 (×4): 2 via RESPIRATORY_TRACT
  Filled 2017-07-19: qty 8.8

## 2017-07-19 MED ORDER — METOCLOPRAMIDE HCL 10 MG PO TABS: 5.0000 mg | ORAL_TABLET | Freq: Three times a day (TID) | ORAL | Status: DC | PRN

## 2017-07-19 MED ORDER — OXYCODONE HCL 5 MG/5ML PO SOLN: 5.0000 mg | Freq: Once | ORAL | Status: DC | PRN

## 2017-07-19 MED ORDER — DEXAMETHASONE SODIUM PHOSPHATE 4 MG/ML IJ SOLN
INTRAMUSCULAR | Status: DC | PRN
  Administered 2017-07-19: 5 mg via INTRAVENOUS

## 2017-07-19 MED ORDER — ONDANSETRON HCL 4 MG PO TABS
4.0000 mg | ORAL_TABLET | Freq: Four times a day (QID) | ORAL | Status: DC | PRN
Start: 2017-07-19 — End: 2017-07-21

## 2017-07-19 MED ORDER — GLYCOPYRROLATE 0.2 MG/ML IJ SOLN
INTRAMUSCULAR | Status: DC | PRN
  Administered 2017-07-19: 0.2 mg via INTRAVENOUS

## 2017-07-19 MED ORDER — FENTANYL CITRATE (PF) 100 MCG/2ML IJ SOLN
INTRAMUSCULAR | Status: AC
  Filled 2017-07-19: qty 2

## 2017-07-19 MED ORDER — MIDAZOLAM HCL 5 MG/5ML IJ SOLN
INTRAMUSCULAR | Status: DC | PRN
  Administered 2017-07-19: 2 mg via INTRAVENOUS

## 2017-07-19 MED ORDER — GLYCOPYRROLATE 0.2 MG/ML IJ SOLN
INTRAMUSCULAR | Status: AC
  Filled 2017-07-19: qty 1

## 2017-07-19 SURGICAL SUPPLY — 58 items
BANDAGE ACE 6X5 VEL STRL LF (GAUZE/BANDAGES/DRESSINGS) ×3 IMPLANT
BANDAGE ELASTIC 6 LF NS (GAUZE/BANDAGES/DRESSINGS) ×3 IMPLANT
BONE CEMENT PALACOSE (Cement) ×3 IMPLANT
CANISTER SUCT 1200ML W/VALVE (MISCELLANEOUS) ×3 IMPLANT
CANISTER SUCT 3000ML PPV (MISCELLANEOUS) ×3 IMPLANT
CAPT KNEE PARTIAL 2 ×3 IMPLANT
CATH FOL LEG HOLDER (MISCELLANEOUS) ×3 IMPLANT
CATH TRAY METER 16FR LF (MISCELLANEOUS) ×3 IMPLANT
CEMENT BONE PALACOSE (Cement) ×1 IMPLANT
CHLORAPREP W/TINT 26ML (MISCELLANEOUS) ×6 IMPLANT
COOLER POLAR GLACIER W/PUMP (MISCELLANEOUS) ×3 IMPLANT
COVER MAYO STAND STRL (DRAPES) ×3 IMPLANT
CUFF TOURN 24 STER (MISCELLANEOUS) IMPLANT
CUFF TOURN 30 STER DUAL PORT (MISCELLANEOUS) ×3 IMPLANT
DRAPE C-ARM XRAY 36X54 (DRAPES) IMPLANT
DRSG OPSITE POSTOP 4X12 (GAUZE/BANDAGES/DRESSINGS) IMPLANT
DRSG OPSITE POSTOP 4X14 (GAUZE/BANDAGES/DRESSINGS) IMPLANT
DRSG OPSITE POSTOP 4X6 (GAUZE/BANDAGES/DRESSINGS) ×3 IMPLANT
ELECT CAUTERY BLADE 6.4 (BLADE) ×3 IMPLANT
ELECT REM PT RETURN 9FT ADLT (ELECTROSURGICAL) ×3
ELECTRODE REM PT RTRN 9FT ADLT (ELECTROSURGICAL) ×1 IMPLANT
GAUZE PETRO XEROFOAM 1X8 (MISCELLANEOUS) IMPLANT
GAUZE SPONGE 4X4 12PLY STRL (GAUZE/BANDAGES/DRESSINGS) ×3 IMPLANT
GLOVE BIO SURGEON STRL SZ7.5 (GLOVE) ×12 IMPLANT
GLOVE BIO SURGEON STRL SZ8 (GLOVE) ×12 IMPLANT
GLOVE BIOGEL PI IND STRL 8 (GLOVE) ×1 IMPLANT
GLOVE BIOGEL PI INDICATOR 8 (GLOVE) ×2
GLOVE INDICATOR 8.0 STRL GRN (GLOVE) ×3 IMPLANT
GOWN STRL REUS W/ TWL LRG LVL3 (GOWN DISPOSABLE) ×1 IMPLANT
GOWN STRL REUS W/ TWL XL LVL3 (GOWN DISPOSABLE) ×1 IMPLANT
GOWN STRL REUS W/TWL LRG LVL3 (GOWN DISPOSABLE) ×2
GOWN STRL REUS W/TWL XL LVL3 (GOWN DISPOSABLE) ×2
HOOD PEEL AWAY FLYTE STAYCOOL (MISCELLANEOUS) ×9 IMPLANT
KIT RM TURNOVER STRD PROC AR (KITS) ×3 IMPLANT
MAT BLUE FLOOR 46X72 FLO (MISCELLANEOUS) ×3 IMPLANT
NDL SAFETY 18GX1.5 (NEEDLE) ×3 IMPLANT
NEEDLE SPNL 20GX3.5 QUINCKE YW (NEEDLE) ×3 IMPLANT
NS IRRIG 1000ML POUR BTL (IV SOLUTION) ×3 IMPLANT
PACK BLADE SAW RECIP 70 3 PT (BLADE) ×3 IMPLANT
PACK TOTAL KNEE (MISCELLANEOUS) ×3 IMPLANT
PAD WRAPON POLAR KNEE (MISCELLANEOUS) ×1 IMPLANT
PULSAVAC PLUS IRRIG FAN TIP (DISPOSABLE) ×3
SOL .9 NS 3000ML IRR  AL (IV SOLUTION) ×2
SOL .9 NS 3000ML IRR UROMATIC (IV SOLUTION) ×1 IMPLANT
SPONGE XRAY 4X4 16PLY STRL (MISCELLANEOUS) ×3 IMPLANT
STAPLER SKIN PROX 35W (STAPLE) ×3 IMPLANT
STRAP SAFETY BODY (MISCELLANEOUS) ×3 IMPLANT
SUCTION FRAZIER HANDLE 10FR (MISCELLANEOUS) ×2
SUCTION TUBE FRAZIER 10FR DISP (MISCELLANEOUS) ×1 IMPLANT
SUT VIC AB 2-0 CT1 27 (SUTURE) ×8
SUT VIC AB 2-0 CT1 TAPERPNT 27 (SUTURE) ×4 IMPLANT
SYR 20CC LL (SYRINGE) ×3 IMPLANT
SYR 30ML LL (SYRINGE) ×9 IMPLANT
SYRINGE 10CC LL (SYRINGE) ×3 IMPLANT
SYSTEM VACUUM CEMENT MIXING (MISCELLANEOUS) ×3 IMPLANT
TAPE TRANSPORE STRL 2 31045 (GAUZE/BANDAGES/DRESSINGS) ×3 IMPLANT
TIP FAN IRRIG PULSAVAC PLUS (DISPOSABLE) ×1 IMPLANT
WRAPON POLAR PAD KNEE (MISCELLANEOUS) ×3

## 2017-07-19 NOTE — Transfer of Care (Signed)
Immediate Anesthesia Transfer of Care Note  Patient: [name]  Procedure(s) Performed: Procedure(s): UNICOMPARTMENTAL KNEE (Right)  Patient Location: PACU  Anesthesia Type:Spinal  Level of Consciousness: sedated  Airway & Oxygen Therapy: Patient Spontanous Breathing and Patient connected to nasal cannula oxygen  Post-op Assessment: Report given to RN and Post -op Vital signs reviewed and stable  Post vital signs: Reviewed and stable  Last Vitals:  Vitals:   07/19/17 0620 07/19/17 1005  BP: 135/78   Pulse: 76   Resp: 18   Temp: (!) 36.4 C (P) 36.4 C  SpO2: 100%     Last Pain:  Vitals:   07/19/17 0620  TempSrc: Oral  PainSc: 6       Patients Stated Pain Goal: 2 (07/19/17 5732)  Complications: No apparent anesthesia complications

## 2017-07-19 NOTE — H&P (Signed)
Paper H&P to be scanned into permanent record. H&P reviewed and patient re-examined. No changes. 

## 2017-07-19 NOTE — Progress Notes (Signed)
PT Cancellation Note  Patient Details Name: EPIFANIO DUCHATEAU MRN: [id number] DOB: 03-01-1956   Cancelled Treatment:    Reason Eval/Treat Not Completed: Other (comment).  Pt's sensation has not returned to RLE and pt unable to feel R foot.  Will hold PT until pt more appropriate for Evaluation and Treatment.  Will attempt to see on 8/29.  Thank you for this order.   Encarnacion Chu PT, DPT 07/19/2017, 3:08 PM

## 2017-07-19 NOTE — Anesthesia Post-op Follow-up Note (Signed)
Anesthesia QCDR form completed.        

## 2017-07-19 NOTE — NC FL2 (Signed)
[place] MEDICAID FL2 LEVEL OF CARE SCREENING TOOL     IDENTIFICATION  Patient Name: [name] Birthdate: 10-19-1956 Sex: male Admission Date (Current Location): 07/19/2017  [place] and [place] Number:  Chiropodist and Address:  [facility], [address] [place], [place] [address]      Provider Number: [id number]  Attending Physician Name and Address:  Christena Flake, MD  Relative Name and Phone Number:       Current Level of Care: Hospital Recommended Level of Care: Skilled Nursing Facility Prior Approval Number:    Date Approved/Denied:   PASRR Number:    Discharge Plan: SNF    Current Diagnoses: Patient Active Problem List   Diagnosis Date Noted  . Status post unicompartmental knee replacement, right 07/19/2017  . Viral URI with cough 11/10/2015  . Falls 08/21/2015  . Coronary artery disease 08/21/2015  . Sleep apnea 03/26/2015  . Chest discomfort 01/28/2015  . Shortness of breath 01/28/2015  . S/P CABG x 2 01/28/2015  . Hyperlipidemia 01/28/2015  . Type 2 diabetes mellitus with other circulatory complications (HCC) 01/28/2015  . Morbid obesity (HCC) 01/28/2015  . Orthostatic hypotension 01/28/2015    Orientation RESPIRATION BLADDER Height & Weight     Self, Time, Situation, Place  Normal External catheter Weight: 268 lb (121.6 kg) Height:  5\' 10"  (177.8 cm)  BEHAVIORAL SYMPTOMS/MOOD NEUROLOGICAL BOWEL NUTRITION STATUS      Continent Diet (Heart healthy/carb modified; thin fluids)  AMBULATORY STATUS COMMUNICATION OF NEEDS Skin   Extensive Assist Verbally Surgical wounds (Closed Incision (07/19/17) Right knee)                       Personal Care Assistance Level of Assistance  Bathing, Feeding, Dressing Bathing Assistance: Limited assistance Feeding assistance: Independent Dressing Assistance: Limited assistance     Functional Limitations Info  Sight, Hearing, Speech Sight Info: Adequate Hearing Info:  Adequate Speech Info: Adequate    SPECIAL CARE FACTORS FREQUENCY  PT (By licensed PT), OT (By licensed OT)     PT Frequency: 5x OT Frequency: 5x            Contractures Contractures Info: Not present    Additional Factors Info  Code Status, Allergies, Psychotropic Code Status Info: Full Allergies Info: No Known Allergies Psychotropic Info: See MAR         Current Medications (07/19/2017):  This is the current hospital active medication list Current Facility-Administered Medications  Medication Dose Route Frequency Provider Last Rate Last Dose  . 0.9 % NaCl with KCl 20 mEq/ L  infusion   Intravenous Continuous Poggi, Excell Seltzer, MD 100 mL/hr at 07/19/17 1246    . acetaminophen (TYLENOL) tablet 650 mg  650 mg Oral Q6H PRN Poggi, Excell Seltzer, MD       Or  . acetaminophen (TYLENOL) suppository 650 mg  650 mg Rectal Q6H PRN Poggi, Excell Seltzer, MD      . acetaminophen (TYLENOL) tablet 1,000 mg  1,000 mg Oral Q6H Poggi, John J, MD      . albuterol (PROVENTIL) (2.5 MG/3ML) 0.083% nebulizer solution 2.5 mg  2.5 mg Nebulization Q6H PRN Poggi, Excell Seltzer, MD      . ALPRAZolam Prudy Feeler) tablet 0.25 mg  0.25 mg Oral QHS Poggi, Excell Seltzer, MD      . aspirin EC tablet 81 mg  81 mg Oral Daily Poggi, Excell Seltzer, MD      . atorvastatin (LIPITOR) tablet 40  mg  40 mg Oral QHS Poggi, Excell Seltzer, MD      . bisacodyl (DULCOLAX) suppository 10 mg  10 mg Rectal Daily PRN Poggi, Excell Seltzer, MD      . buPROPion [facility] SR) 12 hr tablet 150 mg  150 mg Oral BID Poggi, Excell Seltzer, MD      . ceFAZolin (ANCEF) 3 g in dextrose 5 % 50 mL IVPB  3 g Intravenous Q6H Poggi, Excell Seltzer, MD   Stopped at 07/19/17 1438  . clopidogrel (PLAVIX) tablet 75 mg  75 mg Oral Daily Poggi, Excell Seltzer, MD      . diphenhydrAMINE (BENADRYL) 12.5 MG/5ML elixir 12.5-25 mg  12.5-25 mg Oral Q4H PRN Poggi, Excell Seltzer, MD      . docusate sodium (COLACE) capsule 100 mg  100 mg Oral BID Poggi, Excell Seltzer, MD      . Melene Muller ON 07/20/2017] enoxaparin (LOVENOX) injection 40 mg  40 mg  Subcutaneous Q24H Poggi, Excell Seltzer, MD      . Melene Muller ON 07/20/2017] ezetimibe (ZETIA) tablet 10 mg  10 mg Oral Daily Poggi, Excell Seltzer, MD      . Melene Muller ON 07/21/2017] fludrocortisone (FLORINEF) tablet 0.1 mg  0.1 mg Oral Once per day on Mon Thu Poggi, John J, MD      . gabapentin (NEURONTIN) capsule 300 mg  300 mg Oral Daily Poggi, Excell Seltzer, MD      . HYDROmorphone (DILAUDID) injection 1-2 mg  1-2 mg Intravenous Q2H PRN Poggi, Excell Seltzer, MD      . ketorolac (TORADOL) 15 MG/ML injection 15 mg  15 mg Intravenous Q6H Poggi, Excell Seltzer, MD   15 mg at 07/19/17 1246  . magnesium hydroxide (MILK OF MAGNESIA) suspension 30 mL  30 mL Oral Daily PRN Poggi, Excell Seltzer, MD      . Melene Muller ON 07/20/2017] metFORMIN (GLUCOPHAGE) tablet 850 mg  850 mg Oral Q breakfast Poggi, Excell Seltzer, MD      . metoCLOPramide (REGLAN) tablet 5-10 mg  5-10 mg Oral Q8H PRN Poggi, Excell Seltzer, MD       Or  . metoCLOPramide (REGLAN) injection 5-10 mg  5-10 mg Intravenous Q8H PRN Poggi, Excell Seltzer, MD      . metoprolol tartrate (LOPRESSOR) tablet 12.5 mg  12.5 mg Oral BID Poggi, Excell Seltzer, MD      . Melene Muller ON 07/20/2017] midodrine (PROAMATINE) tablet 10 mg  10 mg Oral q morning - 10a Poggi, Excell Seltzer, MD      . mometasone-formoterol (DULERA) 200-5 MCG/ACT inhaler 2 puff  2 puff Inhalation BID Poggi, Excell Seltzer, MD      . nitroGLYCERIN (NITROSTAT) SL tablet 0.4 mg  0.4 mg Sublingual Q5 min PRN Poggi, Excell Seltzer, MD      . ondansetron (ZOFRAN) tablet 4 mg  4 mg Oral Q6H PRN Poggi, Excell Seltzer, MD       Or  . ondansetron (ZOFRAN) injection 4 mg  4 mg Intravenous Q6H PRN Poggi, Excell Seltzer, MD      . oxyCODONE (Oxy IR/ROXICODONE) immediate release tablet 5-10 mg  5-10 mg Oral Q3H PRN Poggi, Excell Seltzer, MD      . pantoprazole (PROTONIX) EC tablet 40 mg  40 mg Oral Daily Poggi, Excell Seltzer, MD      . sodium phosphate (FLEET) 7-19 GM/118ML enema 1 enema  1 enema Rectal Once PRN Poggi, Excell Seltzer, MD      . tamsulosin (FLOMAX) capsule 0.4 mg  0.4 mg  Oral Daily Poggi, Excell Seltzer, MD      . Melene Muller ON 07/20/2017]  venlafaxine XR (EFFEXOR-XR) 24 hr capsule 225 mg  225 mg Oral BH-q7a Poggi, Excell Seltzer, MD         Discharge Medications: Please see discharge summary for a list of discharge medications.  Relevant Imaging Results:  Relevant Lab Results:   Additional Information SSN: [id number]  Dominic Pea, LCSW

## 2017-07-19 NOTE — Op Note (Signed)
07/19/2017  9:50 AM  Patient:   [name]  Pre-Op Diagnosis:   Osteoarthritis of medial compartment, right knee.  Post-Op Diagnosis:   Same  Procedure:   Right unicondylar knee arthroplasty.  Surgeon:   Maryagnes Amos, MD  Assistant:   Horris Latino, PA-C  Anesthesia:   Spinal  Findings:   As above.  Complications:   None  EBL:   10 cc  Fluids:   1500 cc crystalloid  UOP:   55 cc  TT:   85 minutes at 300 mmHg  Drains:   None  Closure:   Staples  Implants:   All-cemented Biomet Oxford system with a medium femoral component, a "D" sized tibial tray, and a 4 mm meniscal bearing insert.  Brief Clinical Note:   The patient is a 61 year old male with a history of progressively worsening medial sided right knee pain. His symptoms have progressed despite medications, activity modification, etc. His history and examination consistent with degenerative joint disease confirmed by plain radiographs. An MRI scan also demonstrated significant degenerative changes, as well as a degenerative medial meniscus tear and significant bone marrow edema, primarily involving the medial tibial plateau. The patient presents at this time for a right partial knee replacement.  Procedure:   The patient was brought into the operating room and a spinal placed by the anesthesiologist. The patient was lain in the supine position and a Foley catheter inserted. The patient was repositioned so that the non-surgical leg was placed in a flexed and abducted position in the yellow fin leg holder while the surgical extremity was placed over the Biomet leg holder. The right lower extremity was prepped with ChloraPrep solution before being draped sterilely. Preoperative antibiotics were administered. After performing a timeout to verify the appropriate surgical site, the limb was exsanguinated with an Esmarch and the tourniquet inflated to 300 mmHg. A standard anterior approach to the knee was made through an  approximately 3.5-4 inch incision. The incision was carried down through the subcutaneous tissues to expose the superficial retinaculum. This was split the length the incision and medial flap elevated sufficiently to expose the medial retinaculum. This was incised along the medial border of the patella tendon and extended proximally along the medial border of the patella, leaving a 3-4 mm cuff of tissue. The soft tissues were elevated off the anteromedial aspect of the proximal tibia. The anterior portion of the meniscus was removed after performing a subtotal excision of the infrapatellar fat pad. The anterior cruciate ligament was inspected and found to be in excellent condition. Osteophytes were removed from the inferior pole of the patella as well as from the notch using a quarter-inch osteotome. There were significant degenerative changes of both the femur and tibia on the medial side. The medial femoral condyle was sized using the small and medium sizers. It was felt that the medium guide best optimized the contour of the femur. This was left in place and the external tibial guide positioned. The coupling device was used to connect the guide to the medial femoral condylar sizer to optimize appropriate orientation. Two guide pins were inserted into the cutting block before the coupling device and sizer were removed. The appropriate tibial cut was made using the oscillating and reciprocating saws. The piece was removed in its entirety and taken to the back table where it was sized and found to be optimally replicated by a "D" sized component. The 9 mm spacer was inserted to verify that sufficient bone had  been removed.  Attention was directed to femoral side. The intramedullary canal was accessed through a 4 mm drill hole. The intramedullary guide was positioned before the guide for the femoral condylar holes was positioned. The appropriate coupling device connected this guide to the intramedullary guide  before both drill holes were created in the distal aspect of the medial femoral condyle. The devices were removed and the posterior condylar cutting block inserted. The appropriate cut was made using the reciprocating saw and this piece removed. The #0 spigot was inserted and the initial bone milling performed. A trial femoral component was inserted and both the flexion and extension gaps measured. In flexion, the gap measured 6 mm whereas in extension, it measured 4 mm. Therefore, the #2 spigot was selected and the secondary bone milling performed. Repeat sizing demonstrated symmetric flexion and extension gaps. The bone was removed from the postero-medial and postero-lateral aspects of the femoral condyle, as well as from the beneath the collar of the spigot. Bone also was removed from the anterior portion of the femur so as to minimize any potential impingement with the meniscal bearing insert. The trial components removed and several drill holes placed into the distal femoral condyle to further augment cement fixation.  Attention was redirected to the tibial side. The "D" sized tibial tray was positioned and temporarily secured using the appropriate spiked nail. The keel was created using the bi-bladed reciprocating saw and hoe. The keeled "D" sized trial tibial tray was inserted to be sure that it seated properly. At this point, a total of 20 cc of Exparel diluted out to 60 cc with normal saline and 30 cc of 0.5% Sensorcaine was injected in and around the posterior and medial capsular tissues, as well as the peri-incisional tissues to help with postoperative pain control.  The bony surfaces were prepared for cementing by irrigating them thoroughly with bacitracin saline solution using the jet lavage system before packing them with a dry Ray-Tec sponge. Meanwhile, cement was being mixed on the back table. When the cement was ready, the tibial tray was cemented in first. The excess cement was removed using a  Public house manager after impacting it into place. Next, the femoral component was impacted into place. Again the excess cement was removed using a Public house manager. The 4 mm spacer was inserted and the knee brought into near full extension while the cement hardened. Once the cement hardened, the spacer was removed and the 4 mm meniscal bearing insert was trialed. This demonstrated excellent tracking while the knee was placed through a range of motion, and showed no evidence towards subluxation or dislocation. In addition, it did not fit too tightly. Therefore, the permanent 4 mm meniscal bearing insert was snapped into position after verifying that no cement had been retained posteriorly. Again the knee was placed through a range of motion with the findings as described above.  The wound was copiously irrigated with bacitracin saline solution via the jet lavage system before the retinacular layer was reapproximated using #0 Vicryl interrupted sutures. At this point, 1 g of transexemic acid in 10 cc of normal saline was injected intra-articularly. The subcutaneous tissues were closed in two layers using 2-0 Vicryl interrupted sutures before the skin was closed using staples. A sterile occlusive dressing was applied to the knee before the patient was awakened. The patient was transferred back to his hospital bed and returned to the recovery room in satisfactory condition after tolerating the procedure well. A Polar Care device was applied  to the knee as well.

## 2017-07-19 NOTE — Anesthesia Preprocedure Evaluation (Addendum)
Anesthesia Evaluation  Patient identified by MRN, date of birth, ID band Patient awake    Reviewed: Allergy & Precautions, NPO status , Patient's Chart, lab work & pertinent test results  History of Anesthesia Complications Negative for: history of anesthetic complications  Airway Mallampati: II  TM Distance: >3 FB Neck ROM: Full    Dental  (+) Missing, Poor Dentition   Pulmonary sleep apnea and Continuous Positive Airway Pressure Ventilation , neg COPD,    breath sounds clear to auscultation- rhonchi (-) wheezing      Cardiovascular hypertension, + CAD, + Past MI, + Cardiac Stents (all prior to CABG) and + CABG (2014)   Rhythm:Regular Rate:Normal - Systolic murmurs and - Diastolic murmurs Echo 02/28/17: - Left ventricle: The cavity size was normal. Systolic function was   normal. The estimated ejection fraction was in the range of 60%   to 65%. Septal wall motion abnormality consistent with   post-operative state/conduction abnormality. Features are   consistent with a pseudonormal left ventricular filling pattern,   with concomitant abnormal relaxation and increased filling   pressure (grade 2 diastolic dysfunction). - Left atrium: The atrium was mildly dilated. - Right ventricle: Systolic function was normal. - Pulmonary arteries: Systolic pressure was within the normal   range.   Neuro/Psych PSYCHIATRIC DISORDERS Anxiety Depression negative neurological ROS     GI/Hepatic Neg liver ROS, GERD  ,  Endo/Other  diabetes, Oral Hypoglycemic Agents  Renal/GU negative Renal ROS     Musculoskeletal negative musculoskeletal ROS (+)   Abdominal (+) + obese,   Peds  Hematology negative hematology ROS (+)   Anesthesia Other Findings Past Medical History: No date: Anxiety No date: Atherosclerosis of coronary artery bypass graft with angina  pectoris (HCC) No date: Barrett's esophagus No date: Chronic constipation No  date: Colon polyp No date: Coronary artery disease No date: Depression No date: GERD (gastroesophageal reflux disease) No date: H/O esophageal spasm No date: Hyperlipidemia No date: Hypertension 2011 & 2014: MI (myocardial infarction) (HCC)     Comment:  x 2 No date: Sleep apnea No date: Type 2 diabetes mellitus (HCC)   Reproductive/Obstetrics                             Anesthesia Physical Anesthesia Plan  ASA: III  Anesthesia Plan: Spinal   Post-op Pain Management:    Induction:   PONV Risk Score and Plan: 1 and Ondansetron and Propofol infusion  Airway Management Planned: Natural Airway  Additional Equipment:   Intra-op Plan:   Post-operative Plan:   Informed Consent: I have reviewed the patients History and Physical, chart, labs and discussed the procedure including the risks, benefits and alternatives for the proposed anesthesia with the patient or authorized representative who has indicated his/her understanding and acceptance.   Dental advisory given  Plan Discussed with: Anesthesiologist and CRNA  Anesthesia Plan Comments: (Last dose of plavix 07/13/17)       Lab Results  Component Value Date   WBC 9.0 07/13/2017   HGB 15.3 07/13/2017   HCT 45.6 07/13/2017   MCV 89.0 07/13/2017   PLT 144 (L) 07/13/2017    Anesthesia Quick Evaluation

## 2017-07-19 NOTE — Anesthesia Procedure Notes (Signed)
Spinal  Patient location during procedure: OR Start time: 07/19/2017 7:37 AM End time: 07/19/2017 7:43 AM Staffing Anesthesiologist: Randa Lynn AMY Resident/CRNA: Dartanyon Frankowski Performed: resident/CRNA  Preanesthetic Checklist Completed: patient identified, site marked, surgical consent, pre-op evaluation, timeout performed, IV checked, risks and benefits discussed and monitors and equipment checked Spinal Block Patient position: sitting Prep: ChloraPrep Patient monitoring: heart rate, continuous pulse ox, blood pressure and cardiac monitor Approach: midline Location: L4-5 Injection technique: single-shot Needle Needle type: Introducer and Pencil-Tip  Needle gauge: 24 G Needle length: 9 cm Additional Notes Negative paresthesia. Negative blood return. Positive free-flowing CSF. Expiration date of kit checked and confirmed. Patient tolerated procedure well, without complications.

## 2017-07-20 ENCOUNTER — Encounter: Payer: Self-pay | Admitting: Surgery

## 2017-07-20 LAB — BASIC METABOLIC PANEL
ANION GAP: 6 (ref 5–15)
BUN: 13 mg/dL (ref 6–20)
CHLORIDE: 109 mmol/L (ref 101–111)
CO2: 22 mmol/L (ref 22–32)
Calcium: 8 mg/dL — ABNORMAL LOW (ref 8.9–10.3)
Creatinine, Ser: 1.14 mg/dL (ref 0.61–1.24)
GFR calc Af Amer: >60 mL/min (ref >60)
GFR calc non Af Amer: >60 mL/min (ref >60)
Glucose, Bld: 90 mg/dL (ref 65–99)
POTASSIUM: 4.2 mmol/L (ref 3.5–5.1)
SODIUM: 137 mmol/L (ref 135–145)

## 2017-07-20 LAB — CBC WITH DIFFERENTIAL/PLATELET
BASOS ABS: 0 10*3/uL (ref 0–0.1)
Basophils Relative: 0 %
EOS ABS: 0 10*3/uL (ref 0–0.7)
Eosinophils Relative: 0 %
HCT: 39.1 % — ABNORMAL LOW (ref 40.0–52.0)
HEMOGLOBIN: 13.4 g/dL (ref 13.0–18.0)
LYMPHS ABS: 0.6 10*3/uL — AB (ref 1.0–3.6)
LYMPHS PCT: 6 %
MCH: 30 pg (ref 26.0–34.0)
MCHC: 34.2 g/dL (ref 32.0–36.0)
MCV: 88 fL (ref 80.0–100.0)
Monocytes Absolute: 0.6 10*3/uL (ref 0.2–1.0)
Monocytes Relative: 7 %
NEUTROS PCT: 87 %
Neutro Abs: 7.8 10*3/uL — ABNORMAL HIGH (ref 1.4–6.5)
Platelets: 101 10*3/uL — ABNORMAL LOW (ref 150–440)
RBC: 4.45 MIL/uL (ref 4.40–5.90)
RDW: 14.5 % (ref 11.5–14.5)
WBC: 9 10*3/uL (ref 3.8–10.6)

## 2017-07-20 MED ORDER — CYCLOBENZAPRINE HCL 10 MG PO TABS
5.0000 mg | ORAL_TABLET | Freq: Three times a day (TID) | ORAL | Status: DC | PRN
  Administered 2017-07-20 – 2017-07-21 (×4): 5 mg via ORAL
  Filled 2017-07-20 (×4): qty 1

## 2017-07-20 MED ORDER — OXYCODONE HCL 5 MG PO TABS: 5.0000 mg | ORAL_TABLET | ORAL | 0 refills | Status: DC | PRN

## 2017-07-20 NOTE — Progress Notes (Signed)
Clinical Social Worker (CSW) received SNF consult. PT is recommending outpatient PT. RN case manager aware of above. Please reconsult if future social work needs arise. CSW signing off.   Aliahna Statzer, LCSW [phone]  

## 2017-07-20 NOTE — Evaluation (Signed)
Physical Therapy Evaluation Patient Details Name: [name] MRN: [id number] DOB: 06/12/1956 Today's Date: 07/20/2017   History of Present Illness  Pt is a 61 yo M with OA of the medial compartment of the R knee and is s/p R unicondylar knee arthroplasty.  PMH includes CABG, Barrett's esophagus, CAD, GERD, HLD, HTN, MI x 2, DM II, and per pt h/o syncope but not for the last 4+ months.     Clinical Impression  Pt presents with mild deficits in strength, transfers, gait, R knee ROM, balance, and activity tolerance.  Pt was Ind with bed mobility tasks and required SBA for transfers with good stability and with min verbal cues for sequencing.  Pt able to amb 260' with RW and CGA with step-to gait pattern slowly progressing to reciprocal gait.  Demonstrated and then practiced with pt making 180 deg R turns while avoiding twisting on R knee with pt able to demonstrate skill with good carry over.  Overall pt did very well and anticipate pt would benefit more from OPPT than HHPT upon discharge to address above deficits with pt stating would have access to reliable transportation.       Follow Up Recommendations Outpatient PT    Equipment Recommendations  None recommended by PT    Recommendations for Other Services       Precautions / Restrictions Precautions Precautions: Knee Precaution Booklet Issued: Yes (comment) Restrictions Weight Bearing Restrictions: Yes RLE Weight Bearing: Weight bearing as tolerated Other Position/Activity Restrictions: Pt able to easily perform Ind RLE SLR x 10 without extensor lag      Mobility  Bed Mobility Overal bed mobility: Independent                Transfers Overall transfer level: Needs assistance Equipment used: Rolling walker (2 wheeled) Transfers: Sit to/from Stand Sit to Stand: Supervision         General transfer comment: Pt confident and steady during sit to/from stand from EOB  Ambulation/Gait Ambulation/Gait assistance: Min  guard Ambulation Distance (Feet): 60 Feet Assistive device: Rolling walker (2 wheeled) Gait Pattern/deviations: Step-to pattern;Antalgic   Gait velocity interpretation: Below normal speed for age/gender General Gait Details: Minimally antalgic gait on the R with step-to pattern that progressed toward reciprocal pattern by end of session.  Stairs Stairs:  (Deferred)          Wheelchair Mobility    Modified Rankin (Stroke Patients Only)       Balance Overall balance assessment: Needs assistance Sitting-balance support: No upper extremity supported;Feet supported;Feet unsupported Sitting balance-Leahy Scale: Normal     Standing balance support: Bilateral upper extremity supported Standing balance-Leahy Scale: Good                               Pertinent Vitals/Pain Pain Assessment: No/denies pain    Home Living Family/patient expects to be discharged to:: Private residence Living Arrangements: Spouse/significant other Available Help at Discharge: Family;Available PRN/intermittently Type of Home: House Home Access: Stairs to enter Entrance Stairs-Rails: None Entrance Stairs-Number of Steps: 3 Home Layout: One level Home Equipment: Walker - 2 wheels;Crutches      Prior Function Level of Independence: Independent with assistive device(s)         Comments: Mod Ind with amb with use of one axillary crutch secondary to R knee pain, Ind with ADLs, no recent fall history but reports h/o syncope with falls but none within the last 4+ months  Hand Dominance   Dominant Hand: Right    Extremity/Trunk Assessment   Upper Extremity Assessment Upper Extremity Assessment: Overall WFL for tasks assessed    Lower Extremity Assessment Lower Extremity Assessment: Generalized weakness       Communication   Communication: No difficulties  Cognition Arousal/Alertness: Awake/alert Behavior During Therapy: WFL for tasks assessed/performed Overall  Cognitive Status: Within Functional Limits for tasks assessed                                        General Comments      Exercises Total Joint Exercises Ankle Circles/Pumps: AROM;Both;10 reps Quad Sets: Strengthening;Right;10 reps;15 reps Heel Slides: AROM;Both;5 reps Straight Leg Raises: AROM;Right;10 reps Long Arc Quad: AROM;Both;10 reps;15 reps Knee Flexion: AROM;Both;10 reps;15 reps Goniometric ROM: R knee AROM: flex 100 deg, ext 0 deg. Marching in Standing: AROM;Both;10 reps Other Exercises Other Exercises: HEP education/review per TKA handout   Assessment/Plan    PT Assessment Patient needs continued PT services  PT Problem List Decreased strength;Decreased range of motion;Decreased activity tolerance;Decreased balance;Decreased knowledge of use of DME       PT Treatment Interventions DME instruction;Gait training;Stair training;Functional mobility training;Neuromuscular re-education;Balance training;Therapeutic exercise;Therapeutic activities;Patient/family education    PT Goals (Current goals can be found in the Care Plan section)  Acute Rehab PT Goals Patient Stated Goal: To improve steadily over time PT Goal Formulation: With patient Time For Goal Achievement: 08/02/17 Potential to Achieve Goals: Good    Frequency BID   Barriers to discharge        Co-evaluation               AM-PAC PT "6 Clicks" Daily Activity  Outcome Measure Difficulty turning over in bed (including adjusting bedclothes, sheets and blankets)?: None Difficulty moving from lying on back to sitting on the side of the bed? : None Difficulty sitting down on and standing up from a chair with arms (e.g., wheelchair, bedside commode, etc,.)?: A Little Help needed moving to and from a bed to chair (including a wheelchair)?: A Little Help needed walking in hospital room?: A Little Help needed climbing 3-5 steps with a railing? : A Little 6 Click Score: 20    End of  Session Equipment Utilized During Treatment: Gait belt Activity Tolerance: Patient tolerated treatment well Patient left: in bed;with bed alarm set;with family/visitor present;with call bell/phone within reach Nurse Communication: Mobility status PT Visit Diagnosis: Muscle weakness (generalized) (M62.81);Other abnormalities of gait and mobility (R26.89)    Time: 1610-9604 PT Time Calculation (min) (ACUTE ONLY): 49 min   Charges:   PT Evaluation $PT Eval Low Complexity: 1 Low PT Treatments $Gait Training: 8-22 mins $Therapeutic Exercise: 8-22 mins   PT G Codes:        [name] PT, DPT 07/20/17, 11:05 AM

## 2017-07-20 NOTE — Progress Notes (Signed)
Patient has not voided so far this shift. Bladder scan shows in bladder. IVF running, encouraged to drink more PO fluids. Patient agrees. Will do another bladder scan this afternoon if patient continues to not void.   Suzan Slick, RN

## 2017-07-20 NOTE — Anesthesia Postprocedure Evaluation (Signed)
Anesthesia Post Note  Patient: [name]  Procedure(s) Performed: Procedure(s) (LRB): UNICOMPARTMENTAL KNEE (Right)  Patient location during evaluation: Nursing Unit Anesthesia Type: Spinal Level of consciousness: awake, awake and alert and oriented Pain management: satisfactory to patient Vital Signs Assessment: post-procedure vital signs reviewed and stable Respiratory status: spontaneous breathing Cardiovascular status: blood pressure returned to baseline Postop Assessment: no headache, no backache, no signs of nausea or vomiting and adequate PO intake Anesthetic complications: no     Last Vitals:  Vitals:   07/20/17 0506 07/20/17 0815  BP: 120/68 128/82  Pulse: 62 63  Resp: 19 12  Temp: 36.6 C (!) 36.4 C  SpO2: 94% 96%    Last Pain:  Vitals:   07/20/17 0815  TempSrc: Oral  PainSc:                  Karoline Caldwelleana Ravneet Spilker

## 2017-07-20 NOTE — Care Management Note (Signed)
Case Management Note  Patient Details  Name: [name] MRN: [id number] Date of Birth: 03/08/1956  Subjective/Objective:   POD # 1 right unicompartmental knee. . Met with patient at bedside to discuss discharge planning.  Patient lives at home with his spouse. PT recommending OP PT. Appointment scheduled with [facility] for Friday August 31 at 2:15 pm.. Patient agreeable to time and date. Placed on discharge orders.             Patient has a walker. Pharmacy: [facility] [phone]. Called Lovenox 40 mg # 14 no refills.   Action/Plan: Lovenox called in, No DME. OP PT appointment scheduled.   Expected Discharge Date:  07/21/17               Expected Discharge Plan:  OP Rehab  In-House Referral:     Discharge planning Services  CM Consult  Post Acute Care Choice:    Choice offered to:  Patient  DME Arranged:    DME Agency:     HH Arranged:    [place] Agency:     Status of Service:  In process, will continue to follow  If discussed at Long Length of Stay Meetings, dates discussed:    Additional Comments:  Jolly Mango, RN 07/20/2017, 1:39 PM

## 2017-07-20 NOTE — Progress Notes (Signed)
Took over pt care @1600 . Pt in room with family. Dr. Joice LoftsPoggi requested that nursing perform a dressing change this shift. Dressing change completed with honeycomb dressing only, and polar care placed.

## 2017-07-20 NOTE — Progress Notes (Signed)
Subjective: 1 Day Post-Op Procedure(s) (LRB): UNICOMPARTMENTAL KNEE (Right) Patient reports pain as moderate.   Patient is well, and has had no acute complaints or problems Plan is to go Home after hospital stay.  PT and care management to assist with discharge planning. Negative for chest pain and shortness of breath Fever: Temp of 99.6 last night. Gastrointestinal:Negative for nausea and vomiting, pt reports that he is passing gas.  Objective: Vital signs in last 24 hours: Temp:  [97.6 F (36.4 C)-99.6 F (37.6 C)] 97.9 F (36.6 C) (08/29 0506) Pulse Rate:  [62-76] 62 (08/29 0506) Resp:  [12-20] 19 (08/29 0506) BP: (112-142)/(68-85) 120/68 (08/29 0506) SpO2:  [94 %-100 %] 94 % (08/29 0506) Weight:  [121.6 kg (268 lb)] 121.6 kg (268 lb) (08/28 1117)  Intake/Output from previous day:  Intake/Output Summary (Last 24 hours) at 07/20/17 0745 Last data filed at 07/20/17 [id number]  Gross per 24 hour  Intake          3554.99 ml  Output             1240 ml  Net          2314.99 ml    Intake/Output this shift: No intake/output data recorded.  Labs:  Recent Labs  07/20/17 0456  HGB 13.4    Recent Labs  07/20/17 0456  WBC 9.0  RBC 4.45  HCT 39.1*  PLT 101*    Recent Labs  07/20/17 0456  NA 137  K 4.2  CL 109  CO2 22  BUN 13  CREATININE 1.14  GLUCOSE 90  CALCIUM 8.0*   No results for input(s): LABPT, INR in the last 72 hours.   EXAM General - Patient is Alert, Appropriate and Oriented Extremity - ABD soft Sensation intact distally Intact pulses distally Dorsiflexion/Plantar flexion intact Incision: moderate drainage No cellulitis present Dressing/Incision - blood tinged drainage Motor Function - intact, moving foot and toes well on exam.   Abdomen soft on palpation, normal BS.  Past Medical History:  Diagnosis Date  . Anxiety   . Atherosclerosis of coronary artery bypass graft with angina pectoris (HCC)   . Barrett's esophagus   . Chronic  constipation   . Colon polyp   . Coronary artery disease   . Depression   . GERD (gastroesophageal reflux disease)   . H/O esophageal spasm   . Hyperlipidemia   . Hypertension   . MI (myocardial infarction) (HCC) 2011 & 2014   x 2  . Sleep apnea   . Type 2 diabetes mellitus (HCC)     Assessment/Plan: 1 Day Post-Op Procedure(s) (LRB): UNICOMPARTMENTAL KNEE (Right) Active Problems:   Status post unicompartmental knee replacement, right  Estimated body mass index is 38.45 kg/m as calculated from the following:   Height as of this encounter: 5\' 10"  (1.778 m).   Weight as of this encounter: 121.6 kg (268 lb). Advance diet Up with therapy D/C IV fluids when tolerating po intake.  Labs reviewed, platelets 101 this AM.  CBC and BMP ordered for tomorrow morning. PT did not work with patient yesterday due to tingling in legs, improved this AM. Does report muscle spasms, will add muscle relaxer. Will see how patient does with PT today, may need additional night of inpatient stay.  DVT Prophylaxis - Lovenox, Foot Pumps, TED hose and Plavix Weight-Bearing as tolerated to right leg  J. Horris LatinoLance Raheim Beutler, PA-C [facility] 07/20/2017, 7:45 AM

## 2017-07-20 NOTE — Progress Notes (Signed)
Physical Therapy Treatment Patient Details Name: [name] MRN: [id number] DOB: 1956/06/11 Today's Date: 07/20/2017    History of Present Illness Pt is a 61 yo M with OA of the medial compartment of the R knee and is s/p R unicondylar knee arthroplasty.  PMH includes CABG, Barrett's esophagus, CAD, GERD, HLD, HTN, MI x 2, DM II, and per pt h/o syncope but not for the last 4+ months.     PT Comments    Pt presents with minor deficits in strength, transfers, R knee ROM, gait, balance, and activity tolerance but is progressing well.  Pt SBA with transfers with min verbal cues for sequencing and SBA with amb with RW 2 x 150' with improved reciprocal gait and cadence.  Pt able to ascend/descend 4 steps first with B rails and CGA and then with B axillary crutches and CGA both with min verbal cues for sequencing.  Stair training done with both pt and spouse for reinforcement of proper technique at home.  Pt steady up/down steps and both he and spouse have no concerns regarding stair negotiation upon discharge.  Pt will benefit from OPPT services upon discharge to further address above deficits.        Follow Up Recommendations  Outpatient PT     Equipment Recommendations  None recommended by PT    Recommendations for Other Services       Precautions / Restrictions Precautions Precautions: Knee Precaution Booklet Issued: Yes (comment) Restrictions Weight Bearing Restrictions: Yes RLE Weight Bearing: Weight bearing as tolerated Other Position/Activity Restrictions: Pt able to easily perform Ind RLE SLR x 10 without extensor lag    Mobility  Bed Mobility Overal bed mobility: Independent                Transfers Overall transfer level: Needs assistance Equipment used: Rolling walker (2 wheeled) Transfers: Sit to/from Stand Sit to Stand: Supervision         General transfer comment: Pt confident and steady during sit to/from stand from  EOB  Ambulation/Gait Ambulation/Gait assistance: Supervision Ambulation Distance (Feet): 150 Feet Assistive device: Rolling walker (2 wheeled) Gait Pattern/deviations: Step-through pattern   Gait velocity interpretation: Below normal speed for age/gender General Gait Details: Improved reciprocal gait and cadence during PM session with no LOB   Stairs Stairs: Yes   Stair Management: Two rails;With crutches Number of Stairs: 4 General stair comments: Ascend/descend 4 steps with B rails and then with B axillary crutches with min-mod verbal cues for sequencing, steady without LOB with both rails and crutches  Wheelchair Mobility    Modified Rankin (Stroke Patients Only)       Balance Overall balance assessment: Needs assistance Sitting-balance support: No upper extremity supported;Feet supported;Feet unsupported Sitting balance-Leahy Scale: Normal     Standing balance support: No upper extremity supported Standing balance-Leahy Scale: Good Standing balance comment: Static balance training with feet apart and together with combinations of eyes open/closed and head still/head turns with good stability throughout.                            Cognition Arousal/Alertness: Awake/alert Behavior During Therapy: WFL for tasks assessed/performed Overall Cognitive Status: Within Functional Limits for tasks assessed                                        Exercises Total Joint Exercises  Ankle Circles/Pumps: AROM;Both;10 reps Quad Sets: AROM;Right;10 reps Heel Slides: AROM;Both;5 reps Straight Leg Raises: AROM;Right;10 reps Long Arc Quad: AROM;10 reps;Other (comment);Both Knee Flexion: AROM;Both;10 reps;15 reps Goniometric ROM: R knee AROM: flex 100 deg, ext 0 deg. Marching in Standing: AROM;Both;10 reps Other Exercises Other Exercises: HEP education/review  Other Exercises: Static standing balance training without UE support    General Comments         Pertinent Vitals/Pain Pain Assessment: No/denies pain    Home Living Family/patient expects to be discharged to:: Private residence Living Arrangements: Spouse/significant other Available Help at Discharge: Family;Available PRN/intermittently Type of Home: House Home Access: Stairs to enter Entrance Stairs-Rails: None Home Layout: One level Home Equipment: Walker - 2 wheels;Crutches      Prior Function Level of Independence: Independent with assistive device(s)      Comments: Mod Ind with amb with use of one axillary crutch secondary to R knee pain, Ind with ADLs, no recent fall history but reports h/o syncope with falls but none within the last 4+ months   PT Goals (current goals can now be found in the care plan section) Acute Rehab PT Goals Patient Stated Goal: To improve steadily over time PT Goal Formulation: With patient Time For Goal Achievement: 08/02/17 Potential to Achieve Goals: Good Progress towards PT goals: Progressing toward goals    Frequency    BID      PT Plan      Co-evaluation              AM-PAC PT "6 Clicks" Daily Activity  Outcome Measure  Difficulty turning over in bed (including adjusting bedclothes, sheets and blankets)?: None Difficulty moving from lying on back to sitting on the side of the bed? : None Difficulty sitting down on and standing up from a chair with arms (e.g., wheelchair, bedside commode, etc,.)?: A Little Help needed moving to and from a bed to chair (including a wheelchair)?: A Little Help needed walking in hospital room?: A Little Help needed climbing 3-5 steps with a railing? : A Little 6 Click Score: 20    End of Session Equipment Utilized During Treatment: Gait belt Activity Tolerance: Patient tolerated treatment well Patient left: in bed;with bed alarm set;with call bell/phone within reach;with family/visitor present Nurse Communication: Mobility status PT Visit Diagnosis: Muscle weakness (generalized)  (M62.81);Other abnormalities of gait and mobility (R26.89)     Time: 1324-1350 PT Time Calculation (min) (ACUTE ONLY): 26 min  Charges:  $Gait Training: 8-22 mins $Therapeutic Exercise: 8-22 mins                    G Codes:       DElly Modena PT, DPT 07/20/17, 2:23 PM

## 2017-07-21 LAB — BASIC METABOLIC PANEL
Anion gap: 4 — ABNORMAL LOW (ref 5–15)
BUN: 14 mg/dL (ref 6–20)
CHLORIDE: 105 mmol/L (ref 101–111)
CO2: 27 mmol/L (ref 22–32)
CREATININE: 1.28 mg/dL — AB (ref 0.61–1.24)
Calcium: 8.4 mg/dL — ABNORMAL LOW (ref 8.9–10.3)
GFR calc Af Amer: >60 mL/min (ref >60)
GFR calc non Af Amer: 58 mL/min — ABNORMAL LOW (ref >60)
GLUCOSE: 89 mg/dL (ref 65–99)
Potassium: 4.5 mmol/L (ref 3.5–5.1)
Sodium: 136 mmol/L (ref 135–145)

## 2017-07-21 LAB — CBC
HEMATOCRIT: 38.4 % — AB (ref 40.0–52.0)
Hemoglobin: 13.1 g/dL (ref 13.0–18.0)
MCH: 30 pg (ref 26.0–34.0)
MCHC: 34.1 g/dL (ref 32.0–36.0)
MCV: 87.8 fL (ref 80.0–100.0)
PLATELETS: 110 10*3/uL — AB (ref 150–440)
RBC: 4.37 MIL/uL — ABNORMAL LOW (ref 4.40–5.90)
RDW: 14.9 % — AB (ref 11.5–14.5)
WBC: 8.1 10*3/uL (ref 3.8–10.6)

## 2017-07-21 MED ORDER — KETOROLAC TROMETHAMINE 30 MG/ML IJ SOLN
30.0000 mg | Freq: Once | INTRAMUSCULAR | Status: AC
  Administered 2017-07-21: 30 mg via INTRAVENOUS
  Filled 2017-07-21: qty 1

## 2017-07-21 MED ORDER — CYCLOBENZAPRINE HCL 5 MG PO TABS: 5.0000 mg | ORAL_TABLET | Freq: Three times a day (TID) | ORAL | 1 refills | Status: DC | PRN

## 2017-07-21 NOTE — Care Management Note (Signed)
Case Management Note  Patient Details  Name: [name] MRN: [id number] Date of Birth: 01/16/1956  Subjective/Objective:   Cost of Lovenox is $ 85.00. Will clarify with MD as patient may discharge home on Plavix and patient is aware. He will need to be updated prior to discharge.                  Action/Plan:   Expected Discharge Date:  07/21/17               Expected Discharge Plan:  OP Rehab  In-House Referral:     Discharge planning Services  CM Consult  Post Acute Care Choice:    Choice offered to:  Patient  DME Arranged:    DME Agency:     HH Arranged:    HH Agency:     Status of Service:  In process, will continue to follow  If discussed at Long Length of Stay Meetings, dates discussed:    Additional Comments:  Marily MemosLisa M Tucker Minter, RN 07/21/2017, 9:39 AM

## 2017-07-21 NOTE — Progress Notes (Signed)
Subjective: 2 Days Post-Op Procedure(s) (LRB): UNICOMPARTMENTAL KNEE (Right) Patient reports pain as moderate.   Patient is well, and has had no acute complaints or problems Plan is to go Home after hospital stay with outpatient PT. Negative for chest pain and shortness of breath Fever: No Gastrointestinal:Negative for nausea and vomiting, pt reports that he is passing gas and is urinating well.  Objective: Vital signs in last 24 hours: Temp:  [98 F (36.7 C)-98.2 F (36.8 C)] 98.2 F (36.8 C) (08/30 0734) Pulse Rate:  [61-74] 74 (08/30 0734) Resp:  [17-19] 19 (08/29 1936) BP: (125-126)/(69-75) 125/69 (08/30 0734) SpO2:  [93 %-98 %] 94 % (08/30 0734)  Intake/Output from previous day:  Intake/Output Summary (Last 24 hours) at 07/21/17 1034 Last data filed at 07/21/17 0800  Gross per 24 hour  Intake          1458.33 ml  Output             1570 ml  Net          -111.67 ml    Intake/Output this shift: Total I/O In: 120 [P.O.:120] Out: -   Labs:  Recent Labs  07/20/17 0456 07/21/17 0537  HGB 13.4 13.1    Recent Labs  07/20/17 0456 07/21/17 0537  WBC 9.0 8.1  RBC 4.45 4.37*  HCT 39.1* 38.4*  PLT 101* 110*    Recent Labs  07/20/17 0456 07/21/17 0537  NA 137 136  K 4.2 4.5  CL 109 105  CO2 22 27  BUN 13 14  CREATININE 1.14 1.28*  GLUCOSE 90 89  CALCIUM 8.0* 8.4*   No results for input(s): LABPT, INR in the last 72 hours.   EXAM General - Patient is Alert, Appropriate and Oriented Extremity - ABD soft Sensation intact distally Intact pulses distally Dorsiflexion/Plantar flexion intact Incision: scant drainage No cellulitis present Dressing/Incision - blood tinged drainage Motor Function - intact, moving foot and toes well on exam.   Abdomen soft on palpation, normal BS.  Past Medical History:  Diagnosis Date  . Anxiety   . Atherosclerosis of coronary artery bypass graft with angina pectoris (HCC)   . Barrett's esophagus   . Chronic  constipation   . Colon polyp   . Coronary artery disease   . Depression   . GERD (gastroesophageal reflux disease)   . H/O esophageal spasm   . Hyperlipidemia   . Hypertension   . MI (myocardial infarction) (HCC) 2011 & 2014   x 2  . Sleep apnea   . Type 2 diabetes mellitus (HCC)     Assessment/Plan: 2 Days Post-Op Procedure(s) (LRB): UNICOMPARTMENTAL KNEE (Right) Active Problems:   Status post unicompartmental knee replacement, right  Estimated body mass index is 38.45 kg/m as calculated from the following:   Height as of this encounter: 5\' 10"  (1.778 m).   Weight as of this encounter: 121.6 kg (268 lb). Advance diet Up with therapy D/C IV fluids when tolerating po intake.  Labs reviewed, platelets 110, improved this AM. Plan will be for discharge today with outpatient PT. Pt is passing gas and urinating today.  DVT Prophylaxis - Lovenox, Foot Pumps, TED hose and Plavix Weight-Bearing as tolerated to right leg  J. Horris LatinoLance Calah Gershman, PA-C [facility] 07/21/2017, 10:34 AM

## 2017-07-21 NOTE — Progress Notes (Signed)
Physical Therapy Treatment Patient Details Name: [name] MRN: [id number] DOB: 1956-03-08 Today's Date: 07/21/2017    History of Present Illness Pt is a 61 yo M with OA of the medial compartment of the R knee and is s/p R unicondylar knee arthroplasty.  PMH includes CABG, Barrett's esophagus, CAD, GERD, HLD, HTN, MI x 2, DM II, and per pt h/o syncope but not for the last 4+ months.     PT Comments    Participated in exercises as described below.  Pt was able to stand and ambulate around nursing unit this am with frequent self initiated rest breaks.  Pt reports general increase in pain today.  Pt with some increased anxiety today over mobility due to pain.  Encouragement and education provided.  He was able to increase his ambulation distance this am but stairs were deferred this am due to pt being overly "tense".  Pt stated he did stairs well yesterday and felt comfortable with them  "no problem".     Follow Up Recommendations  Outpatient PT     Equipment Recommendations  None recommended by PT    Recommendations for Other Services       Precautions / Restrictions Precautions Precautions: Knee Restrictions Weight Bearing Restrictions: Yes RLE Weight Bearing: Weight bearing as tolerated Other Position/Activity Restrictions: Pt able to easily perform Ind RLE SLR x 10 without extensor lag    Mobility  Bed Mobility Overal bed mobility: Independent                Transfers Overall transfer level: Needs assistance Equipment used: Rolling walker (2 wheeled) Transfers: Sit to/from Stand Sit to Stand: Supervision         General transfer comment: Verbal cues for hand placement reminders  Ambulation/Gait Ambulation/Gait assistance: Min guard Ambulation Distance (Feet): 200 Feet Assistive device: Rolling walker (2 wheeled) Gait Pattern/deviations: Step-through pattern   Gait velocity interpretation: Below normal speed for age/gender     Stairs             Wheelchair Mobility    Modified Rankin (Stroke Patients Only)       Balance Overall balance assessment: Needs assistance Sitting-balance support: No upper extremity supported;Feet supported;Feet unsupported Sitting balance-Leahy Scale: Normal     Standing balance support: Bilateral upper extremity supported Standing balance-Leahy Scale: Good                              Cognition Arousal/Alertness: Awake/alert Behavior During Therapy: WFL for tasks assessed/performed;Anxious Overall Cognitive Status: Within Functional Limits for tasks assessed                                        Exercises Total Joint Exercises Ankle Circles/Pumps: AROM;Both;10 reps;Supine Quad Sets: AROM;Right;10 reps;Supine Short Arc Quad: AROM;Right;10 reps;Supine Heel Slides: AROM;Supine;Right;10 reps Hip ABduction/ADduction: AROM;Supine;Right;10 reps Straight Leg Raises: AROM;Supine;Right;10 reps Long Arc Quad: AROM;Seated;Right;10 reps Knee Flexion: AROM;Right;Seated;10 reps Goniometric ROM: 0-100 degrees.  Limited by bedrails under bed. Marching in Standing: AROM;Both;10 reps    General Comments        Pertinent Vitals/Pain Pain Assessment: 0-10 Pain Score: 7  Pain Descriptors / Indicators: Sore;Discomfort Pain Intervention(s): Limited activity within patient's tolerance;Monitored during session;Premedicated before session;Ice applied    Home Living  Prior Function            PT Goals (current goals can now be found in the care plan section) Progress towards PT goals: Progressing toward goals    Frequency    BID      PT Plan Current plan remains appropriate    Co-evaluation              AM-PAC PT "6 Clicks" Daily Activity  Outcome Measure  Difficulty turning over in bed (including adjusting bedclothes, sheets and blankets)?: None Difficulty moving from lying on back to sitting on the side of the bed?  : None Difficulty sitting down on and standing up from a chair with arms (e.g., wheelchair, bedside commode, etc,.)?: A Little Help needed moving to and from a bed to chair (including a wheelchair)?: A Little Help needed walking in hospital room?: A Little Help needed climbing 3-5 steps with a railing? : A Little 6 Click Score: 20    End of Session Equipment Utilized During Treatment: Gait belt Activity Tolerance: Patient tolerated treatment well Patient left: in chair;with chair alarm set;with call bell/phone within reach;with family/visitor present         Time: 0932-1001 PT Time Calculation (min) (ACUTE ONLY): 29 min  Charges:  $Gait Training: 8-22 mins $Therapeutic Exercise: 8-22 mins                    G Codes:       Danielle DessSarah Addley Ballinger, PTA 07/21/17, 10:24 AM

## 2017-07-21 NOTE — Discharge Instructions (Signed)
Diet: As you were doing prior to hospitalization   Shower:  May shower but keep the wounds dry, use an occlusive plastic wrap, NO SOAKING IN TUB.  If the bandage gets wet, change with a clean dry gauze.  Dressing:  You may change your dressing as needed. Change the dressing with sterile gauze dressing.    Activity:  Increase activity slowly as tolerated, but follow the weight bearing instructions below.  No lifting or driving for 6 weeks.  Weight Bearing:   Weight bearing as tolerated to right lower extremity.    Blood Clot Prevention:  Return on pre-op dosage of Plavix upon return home.  To prevent constipation: you may use a stool softener such as -  Colace (over the counter) 100 mg by mouth twice a day  Drink plenty of fluids (prune juice may be helpful) and high fiber foods Miralax (over the counter) for constipation as needed.    Itching:  If you experience itching with your medications, try taking only a single pain pill, or even half a pain pill at a time.  You may take up to 10 pain pills per day, and you can also use benadryl over the counter for itching or also to help with sleep.   Precautions:  If you experience chest pain or shortness of breath - call 911 immediately for transfer to the hospital emergency department!!  If you develop a fever greater that 101 F, purulent drainage from wound, increased redness or drainage from wound, or calf pain-Call [facility]                                              Follow- Up Appointment:  [facility]

## 2017-07-21 NOTE — Discharge Summary (Signed)
Physician Discharge Summary  Patient ID: [name] MRN: [id number] DOB/AGE: 1956/10/16 61 y.o.  Admit date: 07/19/2017 Discharge date: 07/21/2017  Admission Diagnoses:  primary osteoarthritis of right knee,complex tear of medial meniscus of right knee Right knee osteoarthritis of the medial compartment.  Discharge Diagnoses: Patient Active Problem List   Diagnosis Date Noted  . Status post unicompartmental knee replacement, right 07/19/2017  . Viral URI with cough 11/10/2015  . Falls 08/21/2015  . Coronary artery disease 08/21/2015  . Sleep apnea 03/26/2015  . Chest discomfort 01/28/2015  . Shortness of breath 01/28/2015  . S/P CABG x 2 01/28/2015  . Hyperlipidemia 01/28/2015  . Type 2 diabetes mellitus with other circulatory complications (HCC) 01/28/2015  . Morbid obesity (HCC) 01/28/2015  . Orthostatic hypotension 01/28/2015  Right knee osteoarthritis of the medial compartment.  Past Medical History:  Diagnosis Date  . Anxiety   . Atherosclerosis of coronary artery bypass graft with angina pectoris (HCC)   . Barrett's esophagus   . Chronic constipation   . Colon polyp   . Coronary artery disease   . Depression   . GERD (gastroesophageal reflux disease)   . H/O esophageal spasm   . Hyperlipidemia   . Hypertension   . MI (myocardial infarction) (HCC) 2011 & 2014   x 2  . Sleep apnea   . Type 2 diabetes mellitus (HCC)      Transfusion: None.   Consultants (if any):   Discharged Condition: Improved  Hospital Course: [name] is an 61 y.o. male who was admitted 07/19/2017 with a diagnosis of right knee osteoarthritis of the medial compartment and went to the operating room on 07/19/2017 and underwent the above named procedures.    Surgeries: Procedure(s): UNICOMPARTMENTAL KNEE on 07/19/2017 Patient tolerated the surgery well. Taken to PACU where she was stabilized and then transferred to the orthopedic floor.  Started on Lovenox 40 q 24 hrs and  Plavix 75mg  daily was continued from home. Foot pumps applied bilaterally at 80 mm. Heels elevated on bed with rolled towels. No evidence of DVT. Negative Homan. Physical therapy started on day #1 for gait training and transfer. OT started day #1 for ADL and assisted devices.  Patient's IV and foley was removed on POD1.  Implants: All-cemented [employer] [place] system with a medium femoral component, a "D" sized tibial tray, and a 4 mm meniscal bearing insert.  He was given perioperative antibiotics:  Anti-infectives    Start     Dose/Rate Route Frequency Ordered Stop   07/19/17 1400  ceFAZolin (ANCEF) 3 g in dextrose 5 % 50 mL IVPB     3 g 130 mL/hr over 30 Minutes Intravenous Every 6 hours 07/19/17 1117 07/20/17 0201   07/19/17 0600  ceFAZolin (ANCEF) 3 g in dextrose 5 % 50 mL IVPB     3 g 130 mL/hr over 30 Minutes Intravenous  Once 07/19/17 0557 07/19/17 0806    .  He was given sequential compression devices, early ambulation, and Lovenox and Plavix for DVT prophylaxis.  He benefited maximally from the hospital stay and there were no complications.    Recent vital signs:  Vitals:   07/20/17 1936 07/21/17 0734  BP: 126/75 125/69  Pulse: 61 74  Resp: 19   Temp: 98 F (36.7 C) 98.2 F (36.8 C)  SpO2: 98% 94%    Recent laboratory studies:  Lab Results  Component Value Date   HGB 13.1 07/21/2017   HGB 13.4 07/20/2017   HGB 15.3  07/13/2017   Lab Results  Component Value Date   WBC 8.1 07/21/2017   PLT 110 (L) 07/21/2017   Lab Results  Component Value Date   INR 1.14 07/13/2017   Lab Results  Component Value Date   NA 136 07/21/2017   K 4.5 07/21/2017   CL 105 07/21/2017   CO2 27 07/21/2017   BUN 14 07/21/2017   CREATININE 1.28 (H) 07/21/2017   GLUCOSE 89 07/21/2017    Discharge Medications:   Allergies as of 07/21/2017   No Known Allergies     Medication List    TAKE these medications   acetaminophen 325 MG tablet Commonly known as:  TYLENOL Take 650 mg  by mouth every 6 (six) hours as needed.   albuterol 108 (90 Base) MCG/ACT inhaler Commonly known as:  PROVENTIL HFA;VENTOLIN HFA Inhale into the lungs every 6 (six) hours as needed for wheezing or shortness of breath.   ALPRAZolam 0.5 MG tablet Commonly known as:  XANAX Take 0.25 mg by mouth at bedtime. Reported on 11/10/2015   aspirin 81 MG tablet Take 81 mg by mouth daily.   atorvastatin 40 MG tablet Commonly known as:  LIPITOR Take 40 mg by mouth at bedtime.   buPROPion 150 MG 12 hr tablet Commonly known as:  WELLBUTRIN SR Take 150 mg by mouth 2 (two) times daily.   clopidogrel 75 MG tablet Commonly known as:  PLAVIX Take 75 mg by mouth daily.   cyclobenzaprine 5 MG tablet Commonly known as:  FLEXERIL Take 1 tablet (5 mg total) by mouth 3 (three) times daily as needed for muscle spasms.   ezetimibe 10 MG tablet Commonly known as:  ZETIA Take 1 tablet (10 mg total) by mouth daily.   fludrocortisone 0.1 MG tablet Commonly known as:  FLORINEF Take 0.1 mg by mouth 2 (two) times a week. TUES AND SAT NIGHT   fluticasone-salmeterol 115-21 MCG/ACT inhaler Commonly known as:  ADVAIR HFA Inhale 2 puffs into the lungs 2 (two) times daily.   gabapentin 300 MG capsule Commonly known as:  NEURONTIN Take 300 mg by mouth daily.   metFORMIN 850 MG tablet Commonly known as:  GLUCOPHAGE Take 850 mg by mouth daily with breakfast.   metoprolol tartrate 25 MG tablet Commonly known as:  LOPRESSOR Take 12.5 mg by mouth 2 (two) times daily.   midodrine 10 MG tablet Commonly known as:  PROAMATINE Take 10 mg by mouth every morning.   nitroGLYCERIN 0.4 MG SL tablet Commonly known as:  NITROSTAT Place 0.4 mg under the tongue every 5 (five) minutes as needed for chest pain.   oxyCODONE 5 MG immediate release tablet Commonly known as:  Oxy IR/ROXICODONE Take 1-2 tablets (5-10 mg total) by mouth every 4 (four) hours as needed for breakthrough pain.   RABEprazole 20 MG  tablet Commonly known as:  ACIPHEX Take 20 mg by mouth at bedtime.   ranitidine 150 MG capsule Commonly known as:  ZANTAC Take 150 mg by mouth daily.   tamsulosin 0.4 MG Caps capsule Commonly known as:  FLOMAX Take 0.4 mg by mouth daily.   traMADol 50 MG tablet Commonly known as:  ULTRAM Take 50 mg by mouth 3 (three) times daily as needed.   TYLENOL PM EXTRA STRENGTH 25-500 MG Tabs tablet Generic drug:  diphenhydramine-acetaminophen Take 1 tablet by mouth at bedtime as needed.   venlafaxine XR 75 MG 24 hr capsule Commonly known as:  EFFEXOR-XR Take 3 capsules by mouth every morning.  Durable Medical Equipment        Start     Ordered   07/19/17 1118  DME Walker rolling  Once    Question:  Patient needs a walker to treat with the following condition  Answer:  Status post unicompartmental knee replacement, right   07/19/17 1117   07/19/17 1118  DME 3 n 1  Once     07/19/17 1117   07/19/17 1118  DME Bedside commode  Once    Question:  Patient needs a bedside commode to treat with the following condition  Answer:  Status post unicompartmental knee replacement, right   07/19/17 1117       Discharge Care Instructions        Start     Ordered   07/21/17 0000  cyclobenzaprine (FLEXERIL) 5 MG tablet  3 times daily PRN    Question:  Supervising Provider  Answer:  Christena Flake   07/21/17 1036   07/20/17 0000  oxyCODONE (OXY IR/ROXICODONE) 5 MG immediate release tablet  Every 4 hours PRN    Question:  Supervising Provider  Answer:  Christena Flake   07/20/17 0753      Diagnostic Studies: Dg Chest 2 View  Result Date: 07/13/2017 CLINICAL DATA:  Preoperative evaluation for upcoming knee replacement EXAM: CHEST  2 VIEW COMPARISON:  08/25/2012 FINDINGS: Postsurgical changes are seen. Cardiac shadow is within normal limits. Lungs are well aerated bilaterally. No focal infiltrate or sizable effusion is noted. Nipple shadow is again noted over the left lung base  stable from the prior exam. IMPRESSION: No acute abnormality noted. Electronically Signed   By: Alcide Clever M.D.   On: 07/13/2017 16:25   Mr Knee Right Wo Contrast  Result Date: 06/27/2017 CLINICAL DATA:  The patient felt a pop in the right knee with immediate onset of pain 4 weeks ago. No known injury. EXAM: MRI OF THE RIGHT KNEE WITHOUT CONTRAST TECHNIQUE: Multiplanar, multisequence MR imaging of the knee was performed. No intravenous contrast was administered. COMPARISON:  None. FINDINGS: MENISCI Medial meniscus: Complete radial tear is seen through the root of the posterior horn. Extensive degenerative signal is seen in the remainder of the posterior horn and extending into the body. No displaced fragment. Lateral meniscus:  Intact. LIGAMENTS Cruciates: Mucoid degeneration of the ACL and PCL is worse in the ACL. A 0.7 x 0.8 x 0.7 cm cyst just medial to the ACL attachment to the tibia is consistent with a ganglion. The ligaments are intact. Collaterals:  Intact. CARTILAGE Patellofemoral: Thinning and irregularity are most notable at the apex of the patella in the mid and superior poles. Medial:  Moderately degenerated. Lateral:  Appears preserved. Joint:  Trace amount of joint fluid. Popliteal Fossa:  No Baker's cyst. Extensor Mechanism:  Intact. Bones: Intense marrow edema is seen in the medial tibial plateau, worst peripherally, consistent with stress change. Mild marrow edema is present in the periphery of the medial femoral condyle. Small osteophytes are identified about the knee. Other: None. IMPRESSION: Complete radial tear root of the posterior horn of the medial meniscus. Marrow edema about the medial compartment consistent with stress change is much worse in the medial tibial plateau. Osteoarthritis about the knee appears worst in the patellofemoral compartment. ACL worse than PCL mucoid degeneration without tear. Electronically Signed   By: Drusilla Kanner M.D.   On: 06/27/2017 14:59   Dg Knee  Right Port  Result Date: 07/19/2017 CLINICAL DATA:  Status post partial right knee joint  prosthesis placement. EXAM: PORTABLE RIGHT KNEE - 1-2 VIEW COMPARISON:  MRI the right knee of June 27, 2017 FINDINGS: The patient has undergone unilateral knee joint prosthesis placement in the medial compartment. Radiographic positioning of the prosthetic components appears good. There is air in fluid in the anterior aspect of the joint space. Surgical skin staples are present. IMPRESSION: No immediate complication following partial right knee joint prosthesis placement. Electronically Signed   By: David  [place] M.D.   On: 07/19/2017 10:22    Disposition: Plan will be for discharge home this afternoon.  Follow-up Information    [facility]. Go on 07/22/2017.   Why:  2:15 pm . First outpatient physical therapy appointment Contact information: [address] [facility] [place] [address] [phone]        Anson [place], PA-C Follow up in 14 day(s).   Specialty:  Physician Assistant Why:  Mindi Slicker information: [address] [place] [address] [phone]          Signed: Meriel Pica  PA-C 07/21/2017, 10:37 AM

## 2017-07-21 NOTE — Care Management Important Message (Signed)
Important Message  Patient Details  Name: [name] MRN: [id number] Date of Birth: 08/06/1956   Medicare Important Message Given:  Yes    Marily MemosLisa M Andee Chivers, RN 07/21/2017, 9:44 AM

## 2017-07-21 NOTE — Progress Notes (Signed)
Patient and wife was giving discharge instructions. Patient have no complaints or SS of distress, will continue to monitor.

## 2017-07-21 NOTE — Care Management (Signed)
Clarified with Janell QuietLance McGee. Patient will discharge home on Plavix and no lovenox. Patient updated.

## 2017-07-22 DIAGNOSIS — Z96652 Presence of left artificial knee joint: Secondary | ICD-10-CM | POA: Diagnosis not present

## 2017-07-22 DIAGNOSIS — G8929 Other chronic pain: Secondary | ICD-10-CM | POA: Diagnosis not present

## 2017-07-22 DIAGNOSIS — M25661 Stiffness of right knee, not elsewhere classified: Secondary | ICD-10-CM | POA: Diagnosis not present

## 2017-07-22 DIAGNOSIS — M25561 Pain in right knee: Secondary | ICD-10-CM | POA: Diagnosis not present

## 2017-07-22 DIAGNOSIS — M6281 Muscle weakness (generalized): Secondary | ICD-10-CM | POA: Diagnosis not present

## 2017-07-27 ENCOUNTER — Other Ambulatory Visit: Payer: Self-pay

## 2017-07-27 DIAGNOSIS — M6281 Muscle weakness (generalized): Secondary | ICD-10-CM | POA: Diagnosis not present

## 2017-07-27 DIAGNOSIS — Z96652 Presence of left artificial knee joint: Secondary | ICD-10-CM | POA: Diagnosis not present

## 2017-07-27 DIAGNOSIS — M25661 Stiffness of right knee, not elsewhere classified: Secondary | ICD-10-CM | POA: Diagnosis not present

## 2017-07-27 NOTE — Patient Outreach (Signed)
[facility] Care Management  07/27/2017  [name] 03/05/1956 [id number]     EMMI-GENERAL DISCHARGE RED ON EMMI ALERT Day # 4 Date: 07/26/17 Red Alert Reason: " Lost interest in things? Yes   Sad/hopeless/anxious/empty? Yes"   Outreach attempt #  1 to patient. No answer. RN CM left HIPAA compliant voicemail message along with contact info.       Plan: RN CM will make outreach attempt to patient within one business day if no return call from patient.    Antionette Fairyoshanda Markisha Meding, RN,BSN,CCM [facility] Care Management Telephonic Care Management Coordinator Direct Phone: [phone] Toll Free: [phone] Fax: [phone]

## 2017-07-27 NOTE — Patient Outreach (Signed)
[facility] ([facility] Care Management  07/27/2017  [name] 01/05/1956 [id number]   [facility] Date: 07/26/17 Red Alert Reason: " Lost interest in things? Yes   Sad/hopeless/anxious/empty? Yes"   Incoming call from patient returning RN CM call. Patient states that he has not been doing so well. He voices that he is continuing to have a lot of pain which has affected his everyday activities and overall mood and affect. RN CM spoke at length with patient regarding pain mgmt. Patient admits that he is probably not taking enough pain meds as he does not want to become addicted and hooked on meds. Discussed with patient pain mgmt and importance of staying on top on pain, not going long intervals without taking meds  and even pre-medicating prior to strenuous activities. He voiced that he had not been doing those things but was open and receptive to try them. RN CM also educated patient on non-pharmacological measures to try as well. He has started outpatient PT and went earlier this week and goes again today. He voices that he discussed these issues with therapist and was told that it would take some time for him to feel better. He has f/u appts in place. He does not have PCP appt and states he was not due to see PCP until next year. Discussed with patient that is recommended that patients follow up with PCP within 2wks of hospital discharge. He will call office to advise them of recent hospitalization and make appt. He voices that he has supportive spouse who is assisting with his care needs. She is taking him back and forth to appts. Patient confirmed that he has all his other meds and understands when and how to take them. He voices no further RN CM needs or concerns at this time. Patient was appreciative of f/u call.   Plan: RN CM will notify [facility] administrative assistant of case status.   Antionette Fairyoshanda Deaunte Dente, RN,BSN,CCM [facility] Care  Management Telephonic Care Management Coordinator Direct Phone: [phone] Toll Free: [phone] Fax: [phone]

## 2017-07-29 DIAGNOSIS — Z96652 Presence of left artificial knee joint: Secondary | ICD-10-CM | POA: Diagnosis not present

## 2017-07-29 DIAGNOSIS — M6281 Muscle weakness (generalized): Secondary | ICD-10-CM | POA: Diagnosis not present

## 2017-08-03 DIAGNOSIS — M25661 Stiffness of right knee, not elsewhere classified: Secondary | ICD-10-CM | POA: Diagnosis not present

## 2017-08-03 DIAGNOSIS — G8929 Other chronic pain: Secondary | ICD-10-CM | POA: Diagnosis not present

## 2017-08-03 DIAGNOSIS — Z96651 Presence of right artificial knee joint: Secondary | ICD-10-CM | POA: Diagnosis not present

## 2017-08-03 DIAGNOSIS — M25561 Pain in right knee: Secondary | ICD-10-CM | POA: Diagnosis not present

## 2017-08-03 DIAGNOSIS — Z96652 Presence of left artificial knee joint: Secondary | ICD-10-CM | POA: Diagnosis not present

## 2017-08-03 DIAGNOSIS — M6281 Muscle weakness (generalized): Secondary | ICD-10-CM | POA: Diagnosis not present

## 2017-08-08 ENCOUNTER — Ambulatory Visit
Admission: RE | Admit: 2017-08-08 | Discharge: 2017-08-08 | Disposition: A | Discharge status: Home / Self Care | Payer: PPO | Source: Ambulatory Visit | Attending: Surgery | Admitting: Surgery

## 2017-08-08 ENCOUNTER — Other Ambulatory Visit: Payer: Self-pay | Admitting: Surgery

## 2017-08-08 DIAGNOSIS — R936 Abnormal findings on diagnostic imaging of limbs: Secondary | ICD-10-CM | POA: Insufficient documentation

## 2017-08-08 DIAGNOSIS — M1711 Unilateral primary osteoarthritis, right knee: Secondary | ICD-10-CM | POA: Diagnosis not present

## 2017-08-08 DIAGNOSIS — Z96651 Presence of right artificial knee joint: Secondary | ICD-10-CM | POA: Insufficient documentation

## 2017-08-08 DIAGNOSIS — Z9889 Other specified postprocedural states: Secondary | ICD-10-CM | POA: Insufficient documentation

## 2017-08-08 DIAGNOSIS — I824Z1 Acute embolism and thrombosis of unspecified deep veins of right distal lower extremity: Secondary | ICD-10-CM | POA: Diagnosis not present

## 2017-08-09 ENCOUNTER — Ambulatory Visit: Payer: PPO

## 2017-08-10 DIAGNOSIS — Z96652 Presence of left artificial knee joint: Secondary | ICD-10-CM | POA: Diagnosis not present

## 2017-08-12 DIAGNOSIS — Z96652 Presence of left artificial knee joint: Secondary | ICD-10-CM | POA: Diagnosis not present

## 2017-08-15 DIAGNOSIS — M25561 Pain in right knee: Secondary | ICD-10-CM | POA: Diagnosis not present

## 2017-08-15 DIAGNOSIS — M6281 Muscle weakness (generalized): Secondary | ICD-10-CM | POA: Diagnosis not present

## 2017-08-15 DIAGNOSIS — G8929 Other chronic pain: Secondary | ICD-10-CM | POA: Diagnosis not present

## 2017-08-15 DIAGNOSIS — Z96652 Presence of left artificial knee joint: Secondary | ICD-10-CM | POA: Diagnosis not present

## 2017-08-15 DIAGNOSIS — M25661 Stiffness of right knee, not elsewhere classified: Secondary | ICD-10-CM | POA: Diagnosis not present

## 2017-08-17 DIAGNOSIS — Z96652 Presence of left artificial knee joint: Secondary | ICD-10-CM | POA: Diagnosis not present

## 2017-08-19 DIAGNOSIS — Z96652 Presence of left artificial knee joint: Secondary | ICD-10-CM | POA: Diagnosis not present

## 2017-08-22 DIAGNOSIS — M25561 Pain in right knee: Secondary | ICD-10-CM | POA: Diagnosis not present

## 2017-08-22 DIAGNOSIS — M6281 Muscle weakness (generalized): Secondary | ICD-10-CM | POA: Diagnosis not present

## 2017-08-22 DIAGNOSIS — Z96652 Presence of left artificial knee joint: Secondary | ICD-10-CM | POA: Diagnosis not present

## 2017-08-22 DIAGNOSIS — G8929 Other chronic pain: Secondary | ICD-10-CM | POA: Diagnosis not present

## 2017-08-22 DIAGNOSIS — M25661 Stiffness of right knee, not elsewhere classified: Secondary | ICD-10-CM | POA: Diagnosis not present

## 2017-08-24 DIAGNOSIS — Z96652 Presence of left artificial knee joint: Secondary | ICD-10-CM | POA: Diagnosis not present

## 2017-08-24 DIAGNOSIS — M25661 Stiffness of right knee, not elsewhere classified: Secondary | ICD-10-CM | POA: Diagnosis not present

## 2017-08-24 DIAGNOSIS — M25561 Pain in right knee: Secondary | ICD-10-CM | POA: Diagnosis not present

## 2017-08-24 DIAGNOSIS — G8929 Other chronic pain: Secondary | ICD-10-CM | POA: Diagnosis not present

## 2017-08-24 DIAGNOSIS — M6281 Muscle weakness (generalized): Secondary | ICD-10-CM | POA: Diagnosis not present

## 2017-08-26 DIAGNOSIS — M25561 Pain in right knee: Secondary | ICD-10-CM | POA: Diagnosis not present

## 2017-08-26 DIAGNOSIS — G8929 Other chronic pain: Secondary | ICD-10-CM | POA: Diagnosis not present

## 2017-08-26 DIAGNOSIS — M6281 Muscle weakness (generalized): Secondary | ICD-10-CM | POA: Diagnosis not present

## 2017-08-26 DIAGNOSIS — Z96652 Presence of left artificial knee joint: Secondary | ICD-10-CM | POA: Diagnosis not present

## 2017-09-06 ENCOUNTER — Telehealth: Payer: Self-pay | Admitting: Cardiovascular Disease

## 2017-09-06 NOTE — Telephone Encounter (Signed)
Dr. Deanna Artis DDS in [place] wants patient to have dental surgery and patient needs [place] to send fax clearance to have this done   Please call patient to discuss   Scheduling pending clearance

## 2017-09-06 NOTE — Telephone Encounter (Signed)
Okay to stop Plavix 5 days prior to surgery Restart after surgery is complete

## 2017-09-06 NOTE — Telephone Encounter (Signed)
Spoke with patient and he states that he needs clearance and instructions on Plavix for upcoming dental procedure. He denies any changes since last appointment. Stress test done 01/26/2017 was normal and echo done 02/28/17 was also normal with last office visit of 01/21/17. Will route to Dr. Mariah Milling for his review.

## 2017-09-07 NOTE — Telephone Encounter (Signed)
No answer. No voicemail. 

## 2017-09-09 NOTE — Telephone Encounter (Addendum)
Spoke with patient and reviewed cardiac clearance information to hold Plavix 5 days prior to surgery and restart after surgery is complete per surgeon's recommendations. Patient requested that I send clearance over to Dr. Wilfred Lacyausey's office. Will route this over to them via fax.

## 2017-10-17 DIAGNOSIS — E119 Type 2 diabetes mellitus without complications: Secondary | ICD-10-CM | POA: Diagnosis not present

## 2017-10-17 DIAGNOSIS — I951 Orthostatic hypotension: Secondary | ICD-10-CM | POA: Diagnosis not present

## 2017-10-17 DIAGNOSIS — I10 Essential (primary) hypertension: Secondary | ICD-10-CM | POA: Diagnosis not present

## 2017-11-17 ENCOUNTER — Telehealth: Payer: Self-pay | Admitting: Cardiovascular Disease

## 2017-11-17 NOTE — Telephone Encounter (Signed)
Pt c/o Shortness Of Breath: STAT if SOB developed within the last 24 hours or pt is noticeably SOB on the phone  1. Are you currently SOB (can you hear that pt is SOB on the phone)? No   2. How long have you been experiencing SOB? 6 wks increasing in severity   3. Are you SOB when sitting or when up moving around?  Moving around ( going upstairs has to catch breath )   4. Are you currently experiencing any other symptoms? CP - not sure if related to costa chondritis dizzy when standing from seated or lying position   Scheduled 12/13/16 [place] 8 am

## 2017-11-17 NOTE — Telephone Encounter (Signed)
Spoke with the patient. He reports intermittent SOB and dizziness related to positional changes over the last 6 weeks or so. If he lays flat on his back on the bed his SOB will resolve within about 30 seconds.  He has had some occasional "staggering" when he walks- currently taking midodrine.  He discussed this with Dr. Terance HartBronstein and was told to monitor his BP. He states he is noticing no drops in his BP associated with any of his symptoms.  He also states that he has had 4 instances over the last 2 months of a "burning/ stinging" sensation to his chest. This last 1-2 minutes and when it occurs he will massage the area and it gives him some, but not total relief of symptoms. His last episode of SOB was yesterday and his last episode of chest burning was last week. He has not required NTG use. Symptoms are not similar to when he had previous stenting/ CABG. He states he has lost 80 lbs since he was here last.  I advised him I will review his symptoms with Dr. Mariah MillingGollan and call him back with further recommendations- he is agreeable. I don't see that he has had stress testing since 2014, just after his CABG.  He has follow up scheduled with Dr. Mariah MillingGollan on 12/13/17.

## 2017-11-22 HISTORY — PX: MOUTH SURGERY: SHX715

## 2017-11-22 NOTE — Telephone Encounter (Signed)
Chest pain sounds atypical as does the dizziness Will need to see him in clinic

## 2017-11-23 DIAGNOSIS — I10 Essential (primary) hypertension: Secondary | ICD-10-CM | POA: Diagnosis not present

## 2017-11-23 DIAGNOSIS — E119 Type 2 diabetes mellitus without complications: Secondary | ICD-10-CM | POA: Diagnosis not present

## 2017-11-23 DIAGNOSIS — I25708 Atherosclerosis of coronary artery bypass graft(s), unspecified, with other forms of angina pectoris: Secondary | ICD-10-CM | POA: Diagnosis not present

## 2017-11-23 NOTE — Telephone Encounter (Signed)
Left message on cell VM to contact the office.

## 2017-11-23 NOTE — Telephone Encounter (Signed)
Reviewed recommendations w/pt who verbalized understanding. He feels stable to wait until January 22. Pt just left PCP office. Dr. Terance HartBronstein feels it's an inner ear issue and has prescribed meclizine. Pt will continue to monitor and understand if sx worsen he may proceed to ED for evaluation.

## 2017-12-10 NOTE — Progress Notes (Signed)
Cardiology Office Note  Date:  12/13/2017   ID:  [name], DOB 1955/12/04, MRN [id number]  PCP:  Lee Baseman, MD   Chief Complaint  Patient presents with  . Other    Patient c/o Chest pain and SOB. Patient states he feels off balance. Meds reviewed verbally with patient.     HPI:  [name] is a 62 year-old gentleman with obesity,  coronary artery disease, stenting to his RCA and circumflex in 2010,  CABG March 2014 with vein graft to the OM, LIMA to the LAD,  Postop atrial fib hyperlipidemia,  obstructive sleep apnea with compliance of his CPAP,  chronic left-sided chest pain,  worsening shortness of breath  diabetes, esophageal spasm, bulging disc in his neck, GERD who presents for routine follow-up of his coronary artery disease  Stress test 01/26/2017 Low risk, no significant ischemia  Echo normal 02/28/2017  Total chol 133, LDL 67  Episodes with chronic orthostasis, very symptomatic Etiology of his orthostasis is still unclear  5 episodes of chest wall pain, pain with palpation Reproducible with palpation he is not taking fludrocortisone  He is taking midodrine  2 in the morning, often misses one at lunch  Weight continues to run high,  Continues to have shortness of breath episodes if he pushes himself too hard No regular walking program Denies any significant leg swelling  Blood pressure dropped from 132 down to 113 supine to sitting position heart rate 66 up to 77  Lab work reviewed with him in detail HBA1C less than 6 Total cholesterol 130s  EKG reviewed personally by myself shows no sinus rhythm with rate 66 bpm, no significant ST or T-wave changes  Other past medical history  October 31 2015 having episode of near syncope, got up from a recliner, felt very lightheaded, recovered without syncope One episode approximately 10 days prior to that, getting up from a lawnmower to a upright position, had near-syncope syncope At that time we  increased his midodrine up to 10 mg, Florinef 0.1 mg every other day  Previous orthostasis numbers/blood pressure numbers at home continue to showdrops in blood pressure with standing sometimes 80 systolic  Previous Orthostatics done in the office shows systolic pressure supine 133/86 with heart rate 69 Sitting 107/76 heart rate 75 Standing 98/63 with heart rate 77 Repeat standing 104/73 with heart rate 91  Previous episode of falling down or falling into people at a baseball game  He did complete pulmonary rehabilitation where he was exercising 3 days per week.  Denies significant shortness of breath or chest discomfort  He is on disability for his exercise intolerance Previously reported having lightheaded spells getting out of the car. Lasix dosing was decreased   lab work reviewed with him showing total cholesterol 144, LDL 80   PMH:   has a past medical history of Anxiety, Atherosclerosis of coronary artery bypass graft with angina pectoris (HCC), Barrett's esophagus, Chronic constipation, Colon polyp, Coronary artery disease, Depression, GERD (gastroesophageal reflux disease), H/O esophageal spasm, Hyperlipidemia, Hypertension, MI (myocardial infarction) (HCC) (2011 & 2014), Sleep apnea, and Type 2 diabetes mellitus (HCC).  PSH:    Past Surgical History:  Procedure Laterality Date  . abscess cellulitis resection     . ANAL FISSURE REPAIR    . CARDIAC CATHETERIZATION  2011  . COLONOSCOPY    . CORONARY ANGIOPLASTY WITH STENT PLACEMENT  2011   stent placement x 3 @ [facility]; Dr. Darrold Junker  . CORONARY ARTERY BYPASS GRAFT  2014  CABG x 2  . EXCISION MASS NECK  1985   faliculitis, incision became infected and was in hospital for 21 days  . PARTIAL KNEE ARTHROPLASTY Right 07/19/2017   Procedure: UNICOMPARTMENTAL KNEE;  Surgeon: Christena FlakePoggi, John J, MD;  Location: [facility] ORS;  Service: Orthopedics;  Laterality: Right;  . TEE WITH CARDIOVERSION      Current Outpatient Medications   Medication Sig Dispense Refill  . acetaminophen (TYLENOL) 325 MG tablet Take 650 mg by mouth every 6 (six) hours as needed.    Marland Kitchen. albuterol (PROVENTIL HFA;VENTOLIN HFA) 108 (90 BASE) MCG/ACT inhaler Inhale into the lungs every 6 (six) hours as needed for wheezing or shortness of breath.    . ALPRAZolam (XANAX) 0.5 MG tablet Take 0.25 mg by mouth at bedtime. Reported on 11/10/2015    . aspirin 81 MG tablet Take 81 mg by mouth daily.    Marland Kitchen. atorvastatin (LIPITOR) 40 MG tablet Take 40 mg by mouth at bedtime.     Marland Kitchen. buPROPion (WELLBUTRIN SR) 150 MG 12 hr tablet Take 150 mg by mouth 2 (two) times daily.    . clopidogrel (PLAVIX) 75 MG tablet Take 75 mg by mouth daily.    . cyclobenzaprine (FLEXERIL) 5 MG tablet Take 1 tablet (5 mg total) by mouth 3 (three) times daily as needed for muscle spasms. 30 tablet 1  . diphenhydramine-acetaminophen (TYLENOL PM EXTRA STRENGTH) 25-500 MG TABS tablet Take 1 tablet by mouth 2 (two) times daily.     Marland Kitchen. ezetimibe (ZETIA) 10 MG tablet Take 1 tablet (10 mg total) by mouth daily. 30 tablet 11  . fluticasone-salmeterol (ADVAIR HFA) 115-21 MCG/ACT inhaler Inhale 2 puffs into the lungs 2 (two) times daily. 1 Inhaler 6  . metFORMIN (GLUCOPHAGE) 850 MG tablet Take 850 mg by mouth daily with breakfast.     . metoprolol tartrate (LOPRESSOR) 25 MG tablet Take 12.5 mg by mouth 2 (two) times daily.    . midodrine (PROAMATINE) 10 MG tablet Take 10 mg by mouth 2 (two) times daily.     . nitroGLYCERIN (NITROSTAT) 0.4 MG SL tablet Place 0.4 mg under the tongue every 5 (five) minutes as needed for chest pain.    . RABEprazole (ACIPHEX) 20 MG tablet Take 20 mg by mouth at bedtime.     . ranitidine (ZANTAC) 150 MG capsule Take 150 mg by mouth daily.     . tamsulosin (FLOMAX) 0.4 MG CAPS capsule Take 0.4 mg by mouth daily.    Marland Kitchen. venlafaxine XR (EFFEXOR-XR) 75 MG 24 hr capsule Take 3 capsules by mouth every morning.     No current facility-administered medications for this visit.       Allergies:   Patient has no known allergies.   Social History:  The patient  reports that  has never smoked. he has never used smokeless tobacco. He reports that he does not drink alcohol or use drugs.   Family History:   family history includes Hyperlipidemia in his father and mother; Hypertension in his father and mother.    Review of Systems: Review of Systems  Constitutional: Negative.   Respiratory: Negative.   Cardiovascular: Negative.   Gastrointestinal: Negative.   Musculoskeletal: Negative.   Neurological: Negative.   Psychiatric/Behavioral: Negative.   All other systems reviewed and are negative.    PHYSICAL EXAM: VS:  BP 132/82 (BP Location: Left Arm, Patient Position: Sitting, Cuff Size: Normal)   Pulse 66   Ht 5\' 10"  (1.778 m)   Wt 234 lb (106.1  kg)   BMI 33.58 kg/m  , BMI Body mass index is 33.58 kg/m. Constitutional:  oriented to person, place, and time. No distress. Obese HENT:  Head: Normocephalic and atraumatic.  Eyes:  no discharge. No scleral icterus.  Neck: Normal range of motion. Neck supple. No JVD present.  Cardiovascular: Normal rate, regular rhythm, normal heart sounds and intact distal pulses. Exam reveals no gallop and no friction rub. No edema No murmur heard. Pulmonary/Chest: Effort normal and breath sounds normal. No stridor. No respiratory distress.  no wheezes.  no rales.  no tenderness.  Abdominal: Soft.  no distension.  no tenderness.  Musculoskeletal: Normal range of motion.  no  tenderness or deformity.  Neurological:  normal muscle tone. Coordination normal. No atrophy Skin: Skin is warm and dry. No rash noted. not diaphoretic.  Psychiatric:  normal mood and affect. behavior is normal. Thought content normal.      Recent Labs: 07/21/2017: BUN 14; Creatinine, Ser 1.28; Hemoglobin 13.1; Platelets 110; Potassium 4.5; Sodium 136    Lipid Panel Reviewed through care everywhere  Wt Readings from Last 3 Encounters:  12/13/17  234 lb (106.1 kg)  07/19/17 268 lb (121.6 kg)  07/13/17 268 lb (121.6 kg)       ASSESSMENT AND PLAN:  Hyperlipidemia, unspecified hyperlipidemia type - Plan: EKG 12-Lead Recommended he continue Zetia 10 mg daily with his Lipitor 40 mg daily Cholesterol at goal  Orthostatic hypotension - Plan: EKG 12-Lead Continue midodrine as he is taking  needs to increase fluid in the mornings as well as during the day  If he continues to have symptoms consider moving Flomax to the evening For continued symptoms we would add back the fludrocortisone  Coronary artery disease involving native coronary artery of native heart without angina pectoris - Plan: EKG 12-Lead No further workup needed recent stress test and echocardiogram normal in 2018  Chest discomfort - Plan: EKG 12-Lead Atypical chest pain on the left likely musculoskeletal Ice pack, NSAIDs as needed, light stretching chiropractic for worsening symptoms,   Shortness of breath - Plan: EKG 12-Lead No further testing needed  recommended weight loss, exercise program  S/P CABG x 2 - Plan: EKG 12-Lead Currently with no symptoms of angina. No further workup at this time. Continue current medication regimen.    Total encounter time more than 25 minutes  Greater than 50% was spent in counseling and coordination of care with the patient  Disposition:   F/U  12 months   Orders Placed This Encounter  Procedures  . EKG 12-Lead     Signed, Dossie Arbour, M.D., Ph.D. 12/13/2017  [facility] [place], [place] [phone]

## 2017-12-13 ENCOUNTER — Encounter: Payer: Self-pay | Admitting: Cardiovascular Disease

## 2017-12-13 ENCOUNTER — Ambulatory Visit: Payer: PPO | Admitting: Cardiovascular Disease

## 2017-12-13 VITALS — BP 132/82 | HR 66 | Ht 70.0 in | Wt 234.0 lb

## 2017-12-13 DIAGNOSIS — E1159 Type 2 diabetes mellitus with other circulatory complications: Secondary | ICD-10-CM | POA: Diagnosis not present

## 2017-12-13 DIAGNOSIS — Z951 Presence of aortocoronary bypass graft: Secondary | ICD-10-CM | POA: Diagnosis not present

## 2017-12-13 DIAGNOSIS — E782 Mixed hyperlipidemia: Secondary | ICD-10-CM | POA: Diagnosis not present

## 2017-12-13 DIAGNOSIS — I951 Orthostatic hypotension: Secondary | ICD-10-CM | POA: Diagnosis not present

## 2017-12-13 DIAGNOSIS — W19XXXS Unspecified fall, sequela: Secondary | ICD-10-CM | POA: Diagnosis not present

## 2017-12-13 DIAGNOSIS — E785 Hyperlipidemia, unspecified: Secondary | ICD-10-CM

## 2017-12-13 DIAGNOSIS — I2510 Atherosclerotic heart disease of native coronary artery without angina pectoris: Secondary | ICD-10-CM

## 2017-12-13 DIAGNOSIS — R0602 Shortness of breath: Secondary | ICD-10-CM

## 2017-12-13 NOTE — Patient Instructions (Signed)
Medication Instructions:   Hydrate, salt load in the morning If still having symtoms (dizzy), More flomax to PM If still having symtoms, Either florinef or stop the metoprolol  Labwork:  No new labs needed  Testing/Procedures:  No further testing at this time   Follow-Up: It was a pleasure seeing you in the office today. Please call us if you have new issues that need to be addressed before your next appt.  [phone]  Your physician wants you to follow-up in: 12 months.  You will receive a reminder letter in the mail two months in advance. If you don't receive a letter, please call our office to schedule the follow-up appointment.  If you need a refill on your cardiac medications before your next appointment, please call your pharmacy.

## 2018-01-05 ENCOUNTER — Other Ambulatory Visit: Payer: Self-pay | Admitting: Cardiovascular Disease

## 2018-01-05 NOTE — Telephone Encounter (Signed)
Please advise if ok to refill. 

## 2018-01-27 ENCOUNTER — Other Ambulatory Visit: Payer: Self-pay | Admitting: Cardiovascular Disease

## 2018-02-15 DIAGNOSIS — R000 Tachycardia, unspecified: Secondary | ICD-10-CM | POA: Diagnosis not present

## 2018-02-15 DIAGNOSIS — R0602 Shortness of breath: Secondary | ICD-10-CM | POA: Diagnosis not present

## 2018-02-15 DIAGNOSIS — I951 Orthostatic hypotension: Secondary | ICD-10-CM | POA: Diagnosis not present

## 2018-03-27 DIAGNOSIS — Z96651 Presence of right artificial knee joint: Secondary | ICD-10-CM | POA: Diagnosis not present

## 2018-03-27 DIAGNOSIS — M1712 Unilateral primary osteoarthritis, left knee: Secondary | ICD-10-CM | POA: Diagnosis not present

## 2018-03-29 DIAGNOSIS — E119 Type 2 diabetes mellitus without complications: Secondary | ICD-10-CM | POA: Diagnosis not present

## 2018-03-29 DIAGNOSIS — R0602 Shortness of breath: Secondary | ICD-10-CM | POA: Diagnosis not present

## 2018-03-29 DIAGNOSIS — I10 Essential (primary) hypertension: Secondary | ICD-10-CM | POA: Diagnosis not present

## 2018-03-29 DIAGNOSIS — G4710 Hypersomnia, unspecified: Secondary | ICD-10-CM | POA: Diagnosis not present

## 2018-03-29 DIAGNOSIS — I25708 Atherosclerosis of coronary artery bypass graft(s), unspecified, with other forms of angina pectoris: Secondary | ICD-10-CM | POA: Diagnosis not present

## 2018-04-01 DIAGNOSIS — G4733 Obstructive sleep apnea (adult) (pediatric): Secondary | ICD-10-CM | POA: Diagnosis not present

## 2018-04-04 DIAGNOSIS — B354 Tinea corporis: Secondary | ICD-10-CM | POA: Diagnosis not present

## 2018-04-29 ENCOUNTER — Other Ambulatory Visit: Payer: Self-pay | Admitting: Cardiovascular Disease

## 2018-06-12 ENCOUNTER — Other Ambulatory Visit: Payer: Self-pay | Admitting: Family Medicine

## 2018-06-12 ENCOUNTER — Ambulatory Visit
Admission: RE | Admit: 2018-06-12 | Discharge: 2018-06-12 | Disposition: A | Discharge status: Home / Self Care | Payer: PPO | Source: Ambulatory Visit | Attending: Family Medicine | Admitting: Family Medicine

## 2018-06-12 ENCOUNTER — Other Ambulatory Visit: Payer: Self-pay | Admitting: Student

## 2018-06-12 DIAGNOSIS — M79605 Pain in left leg: Secondary | ICD-10-CM | POA: Diagnosis not present

## 2018-06-12 DIAGNOSIS — R600 Localized edema: Secondary | ICD-10-CM | POA: Diagnosis not present

## 2018-06-12 DIAGNOSIS — M7989 Other specified soft tissue disorders: Secondary | ICD-10-CM | POA: Diagnosis not present

## 2018-06-22 DIAGNOSIS — M1712 Unilateral primary osteoarthritis, left knee: Secondary | ICD-10-CM | POA: Diagnosis not present

## 2018-06-22 DIAGNOSIS — M25462 Effusion, left knee: Secondary | ICD-10-CM | POA: Diagnosis not present

## 2018-07-23 ENCOUNTER — Other Ambulatory Visit: Payer: Self-pay | Admitting: Cardiovascular Disease

## 2018-09-04 DIAGNOSIS — R3915 Urgency of urination: Secondary | ICD-10-CM | POA: Diagnosis not present

## 2018-09-04 DIAGNOSIS — N451 Epididymitis: Secondary | ICD-10-CM | POA: Diagnosis not present

## 2018-09-04 DIAGNOSIS — N401 Enlarged prostate with lower urinary tract symptoms: Secondary | ICD-10-CM | POA: Diagnosis not present

## 2018-09-04 DIAGNOSIS — R3914 Feeling of incomplete bladder emptying: Secondary | ICD-10-CM | POA: Diagnosis not present

## 2018-09-04 DIAGNOSIS — Z125 Encounter for screening for malignant neoplasm of prostate: Secondary | ICD-10-CM | POA: Diagnosis not present

## 2018-09-04 DIAGNOSIS — R351 Nocturia: Secondary | ICD-10-CM | POA: Diagnosis not present

## 2018-09-22 DIAGNOSIS — M1712 Unilateral primary osteoarthritis, left knee: Secondary | ICD-10-CM | POA: Diagnosis not present

## 2018-09-22 DIAGNOSIS — Z96651 Presence of right artificial knee joint: Secondary | ICD-10-CM | POA: Diagnosis not present

## 2018-10-03 DIAGNOSIS — N401 Enlarged prostate with lower urinary tract symptoms: Secondary | ICD-10-CM | POA: Diagnosis not present

## 2018-10-04 DIAGNOSIS — Z96651 Presence of right artificial knee joint: Secondary | ICD-10-CM | POA: Diagnosis not present

## 2018-10-04 DIAGNOSIS — M1711 Unilateral primary osteoarthritis, right knee: Secondary | ICD-10-CM | POA: Diagnosis not present

## 2018-10-04 DIAGNOSIS — Z96659 Presence of unspecified artificial knee joint: Secondary | ICD-10-CM | POA: Diagnosis not present

## 2018-10-04 DIAGNOSIS — T84038A Mechanical loosening of other internal prosthetic joint, initial encounter: Secondary | ICD-10-CM | POA: Diagnosis not present

## 2018-10-11 DIAGNOSIS — R3914 Feeling of incomplete bladder emptying: Secondary | ICD-10-CM | POA: Diagnosis not present

## 2018-10-11 DIAGNOSIS — N451 Epididymitis: Secondary | ICD-10-CM | POA: Diagnosis not present

## 2018-10-11 DIAGNOSIS — N401 Enlarged prostate with lower urinary tract symptoms: Secondary | ICD-10-CM | POA: Diagnosis not present

## 2018-10-12 ENCOUNTER — Ambulatory Visit
Admission: RE | Admit: 2018-10-12 | Discharge: 2018-10-12 | Disposition: A | Discharge status: Home / Self Care | Payer: PPO | Source: Ambulatory Visit | Attending: Surgery | Admitting: Surgery

## 2018-10-12 ENCOUNTER — Encounter
Admission: RE | Admit: 2018-10-12 | Discharge: 2018-10-12 | Disposition: A | Discharge status: Home / Self Care | Payer: PPO | Source: Ambulatory Visit | Attending: Surgery | Admitting: Surgery

## 2018-10-12 ENCOUNTER — Other Ambulatory Visit: Payer: Self-pay

## 2018-10-12 DIAGNOSIS — I10 Essential (primary) hypertension: Secondary | ICD-10-CM | POA: Insufficient documentation

## 2018-10-12 DIAGNOSIS — G4730 Sleep apnea, unspecified: Secondary | ICD-10-CM | POA: Diagnosis not present

## 2018-10-12 DIAGNOSIS — R079 Chest pain, unspecified: Secondary | ICD-10-CM | POA: Diagnosis not present

## 2018-10-12 DIAGNOSIS — Z419 Encounter for procedure for purposes other than remedying health state, unspecified: Secondary | ICD-10-CM | POA: Diagnosis not present

## 2018-10-12 DIAGNOSIS — Z01818 Encounter for other preprocedural examination: Secondary | ICD-10-CM | POA: Insufficient documentation

## 2018-10-12 HISTORY — DX: Benign prostatic hyperplasia without lower urinary tract symptoms: N40.0

## 2018-10-12 LAB — BASIC METABOLIC PANEL
ANION GAP: 7 (ref 5–15)
BUN: 10 mg/dL (ref 8–23)
CHLORIDE: 105 mmol/L (ref 98–111)
CO2: 26 mmol/L (ref 22–32)
CREATININE: 0.9 mg/dL (ref 0.61–1.24)
Calcium: 8.7 mg/dL — ABNORMAL LOW (ref 8.9–10.3)
GFR calc non Af Amer: >60 mL/min (ref >60)
Glucose, Bld: 101 mg/dL — ABNORMAL HIGH (ref 70–99)
POTASSIUM: 4.1 mmol/L (ref 3.5–5.1)
Sodium: 138 mmol/L (ref 135–145)

## 2018-10-12 LAB — CBC
HCT: 41.3 % (ref 39.0–52.0)
HEMOGLOBIN: 13.5 g/dL (ref 13.0–17.0)
MCH: 27.3 pg (ref 26.0–34.0)
MCHC: 32.7 g/dL (ref 30.0–36.0)
MCV: 83.4 fL (ref 80.0–100.0)
NRBC: 0 % (ref 0.0–0.2)
Platelets: 135 10*3/uL — ABNORMAL LOW (ref 150–400)
RBC: 4.95 MIL/uL (ref 4.22–5.81)
RDW: 14.3 % (ref 11.5–15.5)
WBC: 5.1 10*3/uL (ref 4.0–10.5)

## 2018-10-12 LAB — URINALYSIS, ROUTINE W REFLEX MICROSCOPIC
BILIRUBIN URINE: NEGATIVE
Glucose, UA: NEGATIVE mg/dL
HGB URINE DIPSTICK: NEGATIVE
Ketones, ur: NEGATIVE mg/dL
LEUKOCYTES UA: NEGATIVE
NITRITE: NEGATIVE
PH: 5 (ref 5.0–8.0)
Protein, ur: NEGATIVE mg/dL
SPECIFIC GRAVITY, URINE: 1.018 (ref 1.005–1.030)

## 2018-10-12 LAB — SURGICAL PCR SCREEN
MRSA, PCR: NEGATIVE
Staphylococcus aureus: NEGATIVE

## 2018-10-12 LAB — TYPE AND SCREEN
ABO/RH(D): A POS
Antibody Screen: NEGATIVE

## 2018-10-12 NOTE — Patient Instructions (Signed)
Your procedure is scheduled on: Thurs. 10/26/18 Report to Day Surgery. To find out your arrival time please call [phone] between 1PM - 3PM on Wed 10/25/18.  Remember: Instructions that are not followed completely may result in serious medical risk,  up to and including death, or upon the discretion of your surgeon and anesthesiologist your  surgery may need to be rescheduled.     _X__ 1. Do not eat food after midnight the night before your procedure.                 No gum chewing or hard candies. You may drink clear liquids up to 2 hours                 before you are scheduled to arrive for your surgery- DO not drink clear                 liquids within 2 hours of the start of your surgery.                 Clear Liquids include:  water,  __X__2.  On the morning of surgery brush your teeth with toothpaste and water, you                may rinse your mouth with mouthwash if you wish.  Do not swallow any toothpaste of mouthwash.     _X__ 3.  No Alcohol for 24 hours before or after surgery.   ___ 4.  Do Not Smoke or use e-cigarettes For 24 Hours Prior to Your Surgery.                 Do not use any chewable tobacco products for at least 6 hours prior to                 surgery.  ____  5.  Bring all medications with you on the day of surgery if instructed.   _x__  6.  Notify your doctor if there is any change in your medical condition      (cold, fever, infections).     Do not wear jewelry, make-up, hairpins, clips or nail polish. Do not wear lotions, powders, or perfumes. You may wear deodorant. Do not shave 48 hours prior to surgery. Men may shave face and neck. Do not bring valuables to the hospital.    [facility] is not responsible for any belongings or valuables.  Contacts, dentures or bridgework may not be worn into surgery. Leave your suitcase in the car. After surgery it may be brought to your room. For patients admitted to the hospital, discharge  time is determined by your treatment team.   Patients discharged the day of surgery will not be allowed to drive home.   Please read over the following fact sheets that you were given:    __x__ Take these medicines the morning of surgery with A SIP OF WATER:    1. acetaminophen (TYLENOL) 325 MG tablet if needed  2. buPROPion (WELLBUTRIN SR) 150 MG 12 hr tablet  3. midodrine (PROAMATINE) 10 MG tablet  4.RABEprazole (ACIPHEX) 20 MG tablet  5.  6.  ____ Fleet Enema (as directed)   _x___ Use CHG Soap as directed  ___x_ Use inhalers on the day of surgery albuterol (PROVENTIL HFA;VENTOLIN HFA) 108 (90 BASE) MCG/ACT inhaler and bring it with you fluticasone-salmeterol (ADVAIR HFA) 115-21 MCG/ACT inhaler  __x__ Stop metformin 2 days prior to surgery last dose on 12/2  ____ Take 1/2 of usual insulin dose the night before surgery. No insulin the morning          of surgery.   __x__ Stop Coumadin/Plavix/aspirin as per Dr.Gollan's instructions  __x__ Stop Anti-inflammatories ibuprofen  Thurs 11/28   ____ Stop supplements until after surgery.    ____ Bring C-Pap to the hospital.

## 2018-10-12 NOTE — Pre-Procedure Instructions (Signed)
Sent message to Dr. Mariah MillingGollan about when to stop the Plavix and Aspirin and the need for cardiac optimization before surgery.

## 2018-10-14 LAB — URINE CULTURE: Culture: NO GROWTH

## 2018-10-16 ENCOUNTER — Ambulatory Visit: Payer: PPO | Admitting: Cardiovascular Disease

## 2018-10-16 ENCOUNTER — Encounter: Payer: Self-pay | Admitting: Cardiovascular Disease

## 2018-10-16 VITALS — BP 144/88 | HR 78 | Ht 68.0 in | Wt 222.0 lb

## 2018-10-16 DIAGNOSIS — I25118 Atherosclerotic heart disease of native coronary artery with other forms of angina pectoris: Secondary | ICD-10-CM

## 2018-10-16 MED ORDER — NITROGLYCERIN 0.4 MG SL SUBL: 0.4000 mg | SUBLINGUAL_TABLET | SUBLINGUAL | 3 refills | Status: DC | PRN

## 2018-10-16 NOTE — Progress Notes (Signed)
Cardiology Office Note  Date:  10/16/2018   ID:  [name], DOB 06/19/1956, MRN [id number]  PCP:  Dorothey Baseman, MD   Chief Complaint  Patient presents with  . other    12 mo  follow up. Pre/op knee. Medications reviewed verbally.     HPI:  [name] is a 62 year-old gentleman with obesity,  coronary artery disease, stenting to his RCA and circumflex in 2010,  CABG March 2014 with vein graft to the OM, LIMA to the LAD,  Postop atrial fib hyperlipidemia,  obstructive sleep apnea with compliance of his CPAP,  chronic left-sided chest pain,  worsening shortness of breath  diabetes, esophageal spasm, bulging disc in his neck, GERD Chronic orthostasis, etiology unclear who presents for routine follow-up of his coronary artery disease  Med list unclear, On midodrine unclear dosing, wife does the medications Does not know if he is taking 1 or 2 pills every morning Denies any orthostasis symptoms, otherwise feels well Previously taking midodrine 10 mg 2 pills in the morning, not taking any at lunch No chest pain or shortness of breath He does report having some nosebleeds, concerned about taking aspirin and Plavix  Scheduled for surgery Dec 5th , total knee on the right Dr. Joice Lofts  Previous testing reviewed Stress test 01/26/2017 Low risk, no significant ischemia  Echo normal 02/28/2017  Lab work reviewed Total chol 133, LDL 67  No regular exercise program, weight running high Shortness of breath with heavy exertion, chronic issue No leg swelling  On previous office visit Blood pressure dropped from 132 down to 113 supine to sitting position heart rate 66 up to 77  EKG personally reviewed by myself on todays visit Shows normal sinus rhythm rate 81 bpm no significant ST or T wave changes  Other past medical history  October 31 2015 having episode of near syncope, got up from a recliner, felt very lightheaded, recovered without syncope One episode approximately  10 days prior to that, getting up from a lawnmower to a upright position, had near-syncope syncope At that time we increased his midodrine up to 10 mg, Florinef 0.1 mg every other day  Previous orthostasis numbers/blood pressure numbers at home continue to showdrops in blood pressure with standing sometimes 80 systolic  Previous Orthostatics done in the office shows systolic pressure supine 133/86 with heart rate 69 Sitting 107/76 heart rate 75 Standing 98/63 with heart rate 77 Repeat standing 104/73 with heart rate 91  Previous episode of falling down or falling into people at a baseball game  He did complete pulmonary rehabilitation where he was exercising 3 days per week.  Denies significant shortness of breath or chest discomfort  He is on disability for his exercise intolerance Previously reported having lightheaded spells getting out of the car. Lasix dosing was decreased   lab work reviewed with him showing total cholesterol 144, LDL 80   PMH:   has a past medical history of Anxiety, Atherosclerosis of coronary artery bypass graft with angina pectoris (HCC), Barrett's esophagus, Benign enlargement of prostate, Chronic constipation, Colon polyp, Coronary artery disease, Depression, GERD (gastroesophageal reflux disease), H/O esophageal spasm, Hyperlipidemia, Hypertension, MI (myocardial infarction) (HCC) (2011 & 2014), Sleep apnea, and Type 2 diabetes mellitus (HCC).  PSH:    Past Surgical History:  Procedure Laterality Date  . abscess cellulitis resection     . ANAL FISSURE REPAIR    . CARDIAC CATHETERIZATION  2011  . COLONOSCOPY    . CORONARY ANGIOPLASTY WITH STENT  PLACEMENT  2011   stent placement x 3 @ [facility]; Dr. Darrold JunkerParaschos  . CORONARY ARTERY BYPASS GRAFT  2014   CABG x 2  . EXCISION MASS NECK  1985   faliculitis, incision became infected and was in hospital for 21 days  . MOUTH SURGERY  2019  . PARTIAL KNEE ARTHROPLASTY Right 07/19/2017   Procedure:  UNICOMPARTMENTAL KNEE;  Surgeon: Christena FlakePoggi, John J, MD;  Location: [facility] ORS;  Service: Orthopedics;  Laterality: Right;  . TEE WITH CARDIOVERSION      Current Outpatient Medications  Medication Sig Dispense Refill  . acetaminophen (TYLENOL) 325 MG tablet Take 650 mg by mouth every 6 (six) hours as needed (for pain/headaches.).     Marland Kitchen. albuterol (PROVENTIL HFA;VENTOLIN HFA) 108 (90 BASE) MCG/ACT inhaler Inhale into the lungs every 6 (six) hours as needed for wheezing or shortness of breath.    . ALPRAZolam (XANAX) 0.5 MG tablet Take 0.25 mg by mouth at bedtime.  0  . aspirin 81 MG tablet Take 81 mg by mouth at bedtime.     Marland Kitchen. atorvastatin (LIPITOR) 40 MG tablet Take 40 mg by mouth at bedtime.     Marland Kitchen. buPROPion (WELLBUTRIN SR) 150 MG 12 hr tablet Take 150 mg by mouth 2 (two) times daily.    . cetirizine (ZYRTEC) 10 MG tablet Take 10 mg by mouth at bedtime.    . diphenhydramine-acetaminophen (TYLENOL PM) 25-500 MG TABS tablet Take 1-2 tablets by mouth at bedtime as needed (for sleep.).    Marland Kitchen. ezetimibe (ZETIA) 10 MG tablet Take 1 tablet (10 mg total) by mouth daily. 30 tablet 11  . fluticasone-salmeterol (ADVAIR HFA) 115-21 MCG/ACT inhaler Inhale 2 puffs into the lungs 2 (two) times daily. 1 Inhaler 6  . ibuprofen (ADVIL,MOTRIN) 200 MG tablet Take 400 mg by mouth every 6 (six) hours as needed (for pain.).    Marland Kitchen. metFORMIN (GLUCOPHAGE) 850 MG tablet Take 850 mg by mouth daily with breakfast.     . midodrine (PROAMATINE) 10 MG tablet TAKE 1 TABLET (10 MG TOTAL) BY MOUTH 4 (FOUR) TIMES DAILY - AFTER MEALS AND AT BEDTIME. (Patient taking differently: Take 20 mg by mouth daily. ) 120 tablet 4  . nitroGLYCERIN (NITROSTAT) 0.4 MG SL tablet Place 1 tablet (0.4 mg total) under the tongue every 5 (five) minutes as needed for chest pain. 25 tablet 3  . RABEprazole (ACIPHEX) 20 MG tablet Take 20 mg by mouth at bedtime.     . tamsulosin (FLOMAX) 0.4 MG CAPS capsule Take 0.4 mg by mouth at bedtime.     Marland Kitchen. venlafaxine XR  (EFFEXOR-XR) 75 MG 24 hr capsule Take 225 mg by mouth daily.      No current facility-administered medications for this visit.      Allergies:   Oxycodone   Social History:  The patient  reports that he has never smoked. He has never used smokeless tobacco. He reports that he does not drink alcohol or use drugs.   Family History:   family history includes Hyperlipidemia in his father and mother; Hypertension in his father and mother.   Review of Systems  Constitutional: Negative.   Respiratory: Negative.   Cardiovascular: Negative.   Gastrointestinal: Negative.   Musculoskeletal: Negative.   Neurological: Negative.   Psychiatric/Behavioral: Negative.   All other systems reviewed and are negative.   PHYSICAL EXAM: VS:  BP (!) 144/88 (BP Location: Left Arm, Patient Position: Sitting, Cuff Size: Normal)   Pulse 78   Ht 5\' 8"  (  1.727 m)   Wt 222 lb (100.7 kg)   BMI 33.75 kg/m  , BMI Body mass index is 33.75 kg/m. Constitutional:  oriented to person, place, and time. No distress.  HENT:  Head: Grossly normal Eyes:  no discharge. No scleral icterus.  Neck: No JVD, no carotid bruits  Cardiovascular: Regular rate and rhythm, no murmurs appreciated Pulmonary/Chest: Clear to auscultation bilaterally, no wheezes or rails Abdominal: Soft.  no distension.  no tenderness.  Musculoskeletal: Normal range of motion Neurological:  normal muscle tone. Coordination normal. No atrophy Skin: Skin warm and dry Psychiatric: normal affect, pleasant   Recent Labs: 10/12/2018: BUN 10; Creatinine, Ser 0.90; Hemoglobin 13.5; Platelets 135; Potassium 4.1; Sodium 138    Lipid Panel Reviewed through care everywhere  Wt Readings from Last 3 Encounters:  10/16/18 222 lb (100.7 kg)  10/12/18 222 lb (100.7 kg)  12/13/17 234 lb (106.1 kg)     ASSESSMENT AND PLAN:  Coronary artery disease involving native coronary artery of native heart without angina pectoris - Plan: EKG 12-Lead  stress test  and echocardiogram normal in 2018 Denies any anginal symptoms No further testing needed prior to total knee replacement surgery in several weeks time  Hyperlipidemia, unspecified hyperlipidemia type - Plan: EKG 12-Lead Continue Lipitor Zetia Numbers at goal Recommended weight loss through low carbohydrate diet  Orthostatic hypotension - Plan: EKG 12-Lead Continue midodrine  He will call us to let us know how much midodrine he is taking in the morning, he is unclear as wife does the medications Previously was taking midodrine 20 mill grams in the morning none at lunch and on this regiment had no symptoms of orthostasis He will also check blood pressure when laying supine in preparation for surgery in December.recommended he try to avoid laying supine when on midodrine  he is not on fludrocortisone  Chest discomfort - Plan: EKG 12-Lead No further chest pain symptoms On prior office visit had  muscular skeletal issues  Shortness of breath - Plan: EKG 12-Lead No further testing needed Recommend walking program for conditioning  S/P CABG x 2 - Plan: EKG 12-Lead Plan as above, currently with no anginal symptoms, no further testing    Total encounter time more than 25 minutes  Greater than 50% was spent in counseling and coordination of care with the patient  Disposition:   F/U  12 months   Orders Placed This Encounter  Procedures  . EKG 12-Lead     Signed, Dossie Arbour, M.D., Ph.D. 10/16/2018  [facility] [place], [place] [phone]

## 2018-10-16 NOTE — Patient Instructions (Addendum)
Please call and let me know how much midodrine you bare taking in the Am  Also check blood pressure on the morning on midodrine laying in the bed, flat (may run high)   Medication Instructions:  Stop the plavix 5 days before the surgery Do not restart after surgery  If you need a refill on your cardiac medications before your next appointment, please call your pharmacy.    Lab work: No new labs needed   If you have labs (blood work) drawn today and your tests are completely normal, you will receive your results only by: Marland Kitchen. MyChart Message (if you have MyChart) OR . A paper copy in the mail If you have any lab test that is abnormal or we need to change your treatment, we will call you to review the results.   Testing/Procedures: No new testing needed   Follow-Up: At [facility], you and your health needs are our priority.  As part of our continuing mission to provide you with exceptional heart care, we have created designated Provider Care Teams.  These Care Teams include your primary Cardiologist (physician) and Advanced Practice Providers (APPs -  Physician Assistants and Nurse Practitioners) who all work together to provide you with the care you need, when you need it.  . You will need a follow up appointment in 12 months .   Please call our office 2 months in advance to schedule this appointment.    . Providers on your designated Care Team:   . Nicolasa Duckinghristopher Berge, NP . Eula Listenyan Dunn, PA-C . Marisue IvanJacquelyn Visser, PA-C  Any Other Special Instructions Will Be Listed Below (If Applicable).  For educational health videos Log in to : [email or web address] Or : [email or web address], password : [email or web address]

## 2018-10-17 ENCOUNTER — Telehealth: Payer: Self-pay | Admitting: Cardiovascular Disease

## 2018-10-17 NOTE — Telephone Encounter (Signed)
Patient calling stating Dr Mariah MillingGollan wanted patient to call in his morning BP after taking his Midodrine 10 mg   He states he takes 2 pills of that.  And he waited 2 hours to take his BP  It was 148/87  Would like a call back if we have any concerns

## 2018-10-18 NOTE — Telephone Encounter (Signed)
Reviewed recommendations with patient and he verbalized understanding with no further questions at this time.

## 2018-10-18 NOTE — Telephone Encounter (Signed)
Blood pressure good on midodrine 20 in the morning Should be fine for surgery  would take the midodrine 20 morning of surgery

## 2018-10-18 NOTE — Telephone Encounter (Signed)
Returned the call to the patient. He stated that he is currently taking Midodrine 20 mg daily. He was calling to give Dr. Mariah MillingGollan his blood pressure after lying down for an hour and a half (after taking the Midodrine). His blood pressure was 148/87.

## 2018-10-21 ENCOUNTER — Emergency Department
Admission: EM | Admit: 2018-10-21 | Discharge: 2018-10-21 | Disposition: A | Discharge status: Home / Self Care | Payer: PPO | Source: Home / Self Care

## 2018-10-21 ENCOUNTER — Emergency Department: Payer: PPO

## 2018-10-21 ENCOUNTER — Other Ambulatory Visit: Payer: Self-pay

## 2018-10-21 ENCOUNTER — Encounter: Payer: Self-pay | Admitting: Emergency Medicine

## 2018-10-21 DIAGNOSIS — M1711 Unilateral primary osteoarthritis, right knee: Secondary | ICD-10-CM | POA: Diagnosis not present

## 2018-10-21 DIAGNOSIS — M25561 Pain in right knee: Secondary | ICD-10-CM

## 2018-10-21 DIAGNOSIS — I2510 Atherosclerotic heart disease of native coronary artery without angina pectoris: Secondary | ICD-10-CM | POA: Diagnosis present

## 2018-10-21 DIAGNOSIS — Z951 Presence of aortocoronary bypass graft: Secondary | ICD-10-CM | POA: Diagnosis not present

## 2018-10-21 DIAGNOSIS — Y838 Other surgical procedures as the cause of abnormal reaction of the patient, or of later complication, without mention of misadventure at the time of the procedure: Secondary | ICD-10-CM | POA: Diagnosis present

## 2018-10-21 DIAGNOSIS — T84032A Mechanical loosening of internal right knee prosthetic joint, initial encounter: Secondary | ICD-10-CM | POA: Diagnosis present

## 2018-10-21 DIAGNOSIS — F419 Anxiety disorder, unspecified: Secondary | ICD-10-CM | POA: Diagnosis present

## 2018-10-21 DIAGNOSIS — E1159 Type 2 diabetes mellitus with other circulatory complications: Secondary | ICD-10-CM | POA: Diagnosis not present

## 2018-10-21 DIAGNOSIS — Z8719 Personal history of other diseases of the digestive system: Secondary | ICD-10-CM | POA: Diagnosis not present

## 2018-10-21 DIAGNOSIS — Z7984 Long term (current) use of oral hypoglycemic drugs: Secondary | ICD-10-CM | POA: Diagnosis not present

## 2018-10-21 DIAGNOSIS — F329 Major depressive disorder, single episode, unspecified: Secondary | ICD-10-CM | POA: Diagnosis present

## 2018-10-21 DIAGNOSIS — E785 Hyperlipidemia, unspecified: Secondary | ICD-10-CM | POA: Diagnosis present

## 2018-10-21 DIAGNOSIS — Z23 Encounter for immunization: Secondary | ICD-10-CM | POA: Diagnosis present

## 2018-10-21 DIAGNOSIS — E119 Type 2 diabetes mellitus without complications: Secondary | ICD-10-CM | POA: Diagnosis present

## 2018-10-21 DIAGNOSIS — G4730 Sleep apnea, unspecified: Secondary | ICD-10-CM | POA: Diagnosis present

## 2018-10-21 DIAGNOSIS — I25810 Atherosclerosis of coronary artery bypass graft(s) without angina pectoris: Secondary | ICD-10-CM | POA: Diagnosis present

## 2018-10-21 DIAGNOSIS — J449 Chronic obstructive pulmonary disease, unspecified: Secondary | ICD-10-CM | POA: Diagnosis present

## 2018-10-21 DIAGNOSIS — K5909 Other constipation: Secondary | ICD-10-CM | POA: Diagnosis present

## 2018-10-21 DIAGNOSIS — Z471 Aftercare following joint replacement surgery: Secondary | ICD-10-CM | POA: Diagnosis not present

## 2018-10-21 DIAGNOSIS — K219 Gastro-esophageal reflux disease without esophagitis: Secondary | ICD-10-CM | POA: Diagnosis present

## 2018-10-21 DIAGNOSIS — I252 Old myocardial infarction: Secondary | ICD-10-CM | POA: Diagnosis not present

## 2018-10-21 DIAGNOSIS — Z96651 Presence of right artificial knee joint: Secondary | ICD-10-CM | POA: Diagnosis not present

## 2018-10-21 DIAGNOSIS — I10 Essential (primary) hypertension: Secondary | ICD-10-CM | POA: Diagnosis present

## 2018-10-21 MED ORDER — HYDROCODONE-ACETAMINOPHEN 5-325 MG PO TABS
1.0000 | ORAL_TABLET | Freq: Once | ORAL | Status: AC
  Administered 2018-10-21: 1 via ORAL
  Filled 2018-10-21: qty 1

## 2018-10-21 MED ORDER — HYDROCODONE-ACETAMINOPHEN 5-325 MG PO TABS: 1.0000 | ORAL_TABLET | Freq: Three times a day (TID) | ORAL | 0 refills | Status: DC | PRN

## 2018-10-21 NOTE — ED Triage Notes (Signed)
Patient states he had partial knee replacement, states has "adhesive has failed". Patient states he is scheduled to have surgery with Dr. Joice LoftsPoggi on Thursday for full replacement of knee. States today, his right knee is locked and he is unable to extend knee.

## 2018-10-21 NOTE — ED Provider Notes (Addendum)
[facility] Emergency Department Provider Note ____________________________________________  Time seen: 1057  I have reviewed the triage vital signs and the nursing notes.  HISTORY  Chief Complaint  Knee Pain  HPI [name] is a 62 y.o. male who presents to the ED accompanied by his wife, for evaluation of right knee pain and disability.  Patient is about 18 months status post a partial knee arthroplasty.  He is a current patient of Dr. Joice Lofts, and is currently scheduled for total knee arthroplasty on Thursday.  Since August of this year the patient has had intermittent episodes of catching and locking of the right knee joint secondary to loosening of the prosthetic hardware.  He had the most recent episode just prior to arrival, while at church.  He describes he was sitting in a rolling chair, and while seated, pushed himself to the left with his planted feet.  In doing so he had an immediate palpable pop to the right knee.  Since that time he has had a fixed flexion deformity to the right knee with inability to fully extend the knee.  He presents now for further management, citing that this is usually a self-limited dysfunction of the knee joint, usually it spontaneously resolves.  Past Medical History:  Diagnosis Date  . Anxiety   . Atherosclerosis of coronary artery bypass graft with angina pectoris (HCC)   . Barrett's esophagus   . Benign enlargement of prostate   . Chronic constipation   . Colon polyp   . Coronary artery disease   . Depression   . GERD (gastroesophageal reflux disease)   . H/O esophageal spasm   . Hyperlipidemia   . Hypertension   . MI (myocardial infarction) (HCC) 2011 & 2014   x 2  . Sleep apnea     after weight loss no need for CPAP  . Type 2 diabetes mellitus [facility])     Patient Active Problem List   Diagnosis Date Noted  . Status post unicompartmental knee replacement, right 07/19/2017  . Viral URI with cough 11/10/2015  .  Falls 08/21/2015  . Coronary artery disease 08/21/2015  . Sleep apnea 03/26/2015  . Chest discomfort 01/28/2015  . Shortness of breath 01/28/2015  . S/P CABG x 2 01/28/2015  . Hyperlipidemia 01/28/2015  . Type 2 diabetes mellitus with other circulatory complications (HCC) 01/28/2015  . Morbid obesity (HCC) 01/28/2015  . Orthostatic hypotension 01/28/2015    Past Surgical History:  Procedure Laterality Date  . abscess cellulitis resection     . ANAL FISSURE REPAIR    . CARDIAC CATHETERIZATION  2011  . COLONOSCOPY    . CORONARY ANGIOPLASTY WITH STENT PLACEMENT  2011   stent placement x 3 @ [facility]; Dr. Darrold Junker  . CORONARY ARTERY BYPASS GRAFT  2014   CABG x 2  . EXCISION MASS NECK  1985   faliculitis, incision became infected and was in hospital for 21 days  . MOUTH SURGERY  2019  . PARTIAL KNEE ARTHROPLASTY Right 07/19/2017   Procedure: UNICOMPARTMENTAL KNEE;  Surgeon: Christena Flake, MD;  Location: [facility] ORS;  Service: Orthopedics;  Laterality: Right;  . TEE WITH CARDIOVERSION      Prior to Admission medications   Medication Sig Start Date End Date Taking? Authorizing Provider  acetaminophen (TYLENOL) 325 MG tablet Take 650 mg by mouth every 6 (six) hours as needed (for pain/headaches.).     [provider]  albuterol (PROVENTIL HFA;VENTOLIN HFA) 108 (90 BASE) MCG/ACT inhaler Inhale  into the lungs every 6 (six) hours as needed for wheezing or shortness of breath.    [provider]  ALPRAZolam Prudy Feeler) 0.5 MG tablet Take 0.25 mg by mouth at bedtime. 07/12/18   [provider]  aspirin 81 MG tablet Take 81 mg by mouth at bedtime.     [provider]  atorvastatin (LIPITOR) 40 MG tablet Take 40 mg by mouth at bedtime.     [provider]  buPROPion (WELLBUTRIN SR) 150 MG 12 hr tablet Take 150 mg by mouth 2 (two) times daily.    [provider]  cetirizine (ZYRTEC) 10 MG tablet Take 10 mg by mouth at bedtime.    [provider]  diphenhydramine-acetaminophen (TYLENOL PM) 25-500 MG TABS tablet Take 1-2 tablets by mouth at bedtime as needed (for sleep.).    [provider]  ezetimibe (ZETIA) 10 MG tablet Take 1 tablet (10 mg total) by mouth daily. 01/21/17   Antonieta Iba, MD  fluticasone-salmeterol (ADVAIR HFA) 696-29 MCG/ACT inhaler Inhale 2 puffs into the lungs 2 (two) times daily. 05/12/16   Antonieta Iba, MD  HYDROcodone-acetaminophen (NORCO) 5-325 MG tablet Take 1 tablet by mouth 3 (three) times daily as needed for up to 3 days. 10/21/18 10/24/18  Asaiah Hunnicutt, Charlesetta Ivory, PA-C  ibuprofen (ADVIL,MOTRIN) 200 MG tablet Take 400 mg by mouth every 6 (six) hours as needed (for pain.).    [provider]  metFORMIN (GLUCOPHAGE) 850 MG tablet Take 850 mg by mouth daily with breakfast.     [provider]  midodrine (PROAMATINE) 10 MG tablet TAKE 1 TABLET (10 MG TOTAL) BY MOUTH 4 (FOUR) TIMES DAILY - AFTER MEALS AND AT BEDTIME. Patient taking differently: Take 20 mg by mouth daily.  05/01/18   Antonieta Iba, MD  nitroGLYCERIN (NITROSTAT) 0.4 MG SL tablet Place 1 tablet (0.4 mg total) under the tongue every 5 (five) minutes as needed for chest pain. 10/16/18   Antonieta Iba, MD  RABEprazole (ACIPHEX) 20 MG tablet Take 20 mg by mouth at bedtime.     [provider]  tamsulosin (FLOMAX) 0.4 MG CAPS capsule Take 0.4 mg by mouth at bedtime.     [provider]  venlafaxine XR (EFFEXOR-XR) 75 MG 24 hr capsule Take 225 mg by mouth daily.  05/23/17   [provider]    Allergies Oxycodone  Family History  Problem Relation Age of Onset  . Hypertension Mother   . Hyperlipidemia Mother   . Hypertension Father   . Hyperlipidemia Father     Social History Social History   Tobacco Use  . Smoking status: Never Smoker  . Smokeless tobacco: Never Used  Substance Use Topics  . Alcohol use: No  . Drug use: No    Review of Systems  Constitutional: Negative for  fever. Cardiovascular: Negative for chest pain. Respiratory: Negative for shortness of breath. Gastrointestinal: Negative for abdominal pain, vomiting and diarrhea. Genitourinary: Negative for dysuria. Musculoskeletal: Negative for back pain. Right knee pain & disability as above Skin: Negative for rash. Neurological: Negative for headaches, focal weakness or numbness. ____________________________________________  PHYSICAL EXAM:  VITAL SIGNS: ED Triage Vitals [10/21/18 0939]  Enc Vitals Group     BP (!) 151/90     Pulse Rate 86     Resp (!) 118     Temp 97.6 F (36.4 C)     Temp Source Oral     SpO2 100 %  Weight 222 lb 0.1 oz (100.7 kg)     Height 5\' 8"  (1.727 m)     Head Circumference      Peak Flow      Pain Score 0     Pain Loc      Pain Edu?      Excl. in GC?     Constitutional: Alert and oriented. Well appearing and in no distress. Head: Normocephalic and atraumatic. Eyes: Conjunctivae are normal. Normal extraocular movements Cardiovascular: Normal rate, regular rhythm. Normal distal pulses. Distal edema noted R>L LE Respiratory: Normal respiratory effort. No wheezes/rales/rhonchi. Musculoskeletal: Right knee with bony changes consistent with DJD. Knee is in a fixed flexion position at 25-30 degrees. nontender with normal range of motion in all extremities.  Neurologic:  Normal speech and language. No gross focal neurologic deficits are appreciated. Skin:  Skin is warm, dry and intact. No rash noted. ____________________________________________  PROCEDURES  Procedures Norco 5-325 mg PO Knee immobilizer ____________________________________________  INITIAL IMPRESSION / ASSESSMENT AND PLAN / ED COURSE  ----------------------------------------- 10:31 AM on 10/21/2018 -----------------------------------------  Spoke with Dr. Joice LoftsPoggi via phone, he is made with the patient's case, and suggest at this time patient be placed in a knee immobilizer with  nonweightbearing or weightbearing as tolerated.  He is also suggesting that the patient and family call his office on Monday morning with anticipation that he may be to take patient to surgery on Monday afternoon.  Patient will be n.p.o. after midnight on Sunday evening.  A prescription for #9 hydrocodone is provided for moderate pain relief in the interim.  Patient is advised to avoid any NSAIDs in the interim and will start any preop medicines as previously prescribed, if surgery is confirmed by Monday.  Patient his wife verbalized understanding of discharge instructions.  I reviewed the patient's prescription history over the last 12 months in the multi-state controlled substances database(s) that includes [place], [place], [place], [place], [place], [place], [place], [place], [place], [place], [place], and [place].  Results were notable for no current/recent prescriptions ____________________________________________  FINAL CLINICAL IMPRESSION(S) / ED DIAGNOSES  Final diagnoses:  Acute pain of right knee      Karmen StabsMenshew, Charlesetta IvoryJenise V Bacon, PA-C 10/21/18 [address]1133    Darinda [place], Charlesetta IvoryJenise V Bacon, PA-C 10/21/18 1134    Minna AntisPaduchowski, Kevin, MD 10/21/18 1456

## 2018-10-21 NOTE — Discharge Instructions (Signed)
You are to follow-up with Dr. Joice LoftsPoggi by phone for further instructions.  Plan to be NPO after midnight on Sunday, December 1 Call the office on Monday, December 2 to confirm surgery time Take the pain medicine as needed, avoid all NSAIDs (antiinflammatory meds)

## 2018-10-21 NOTE — ED Notes (Signed)
See triage note  States he has a partial knee replacement which has come dislocated in past  States this am he slid his chair to the side and felt a pop to knee  Now unable to straighten his knee

## 2018-10-23 ENCOUNTER — Inpatient Hospital Stay: Payer: PPO | Admitting: Anesthesiology

## 2018-10-23 ENCOUNTER — Inpatient Hospital Stay: Payer: PPO

## 2018-10-23 ENCOUNTER — Encounter: Payer: Self-pay | Admitting: *Deleted

## 2018-10-23 ENCOUNTER — Inpatient Hospital Stay
Admission: RE | Admit: 2018-10-23 | Discharge: 2018-10-25 | DRG: 467 | Disposition: A | Discharge status: Home / Self Care | Payer: PPO | Source: Ambulatory Visit | Attending: Surgery | Admitting: Surgery

## 2018-10-23 ENCOUNTER — Encounter
Admission: RE | Disposition: A | Discharge status: Home / Self Care | Payer: Self-pay | Source: Ambulatory Visit | Attending: Surgery

## 2018-10-23 ENCOUNTER — Other Ambulatory Visit: Payer: Self-pay

## 2018-10-23 DIAGNOSIS — E119 Type 2 diabetes mellitus without complications: Secondary | ICD-10-CM | POA: Diagnosis present

## 2018-10-23 DIAGNOSIS — I25810 Atherosclerosis of coronary artery bypass graft(s) without angina pectoris: Secondary | ICD-10-CM | POA: Diagnosis present

## 2018-10-23 DIAGNOSIS — Z951 Presence of aortocoronary bypass graft: Secondary | ICD-10-CM

## 2018-10-23 DIAGNOSIS — F419 Anxiety disorder, unspecified: Secondary | ICD-10-CM | POA: Diagnosis present

## 2018-10-23 DIAGNOSIS — Z23 Encounter for immunization: Secondary | ICD-10-CM

## 2018-10-23 DIAGNOSIS — Z8719 Personal history of other diseases of the digestive system: Secondary | ICD-10-CM

## 2018-10-23 DIAGNOSIS — K219 Gastro-esophageal reflux disease without esophagitis: Secondary | ICD-10-CM | POA: Diagnosis present

## 2018-10-23 DIAGNOSIS — Y838 Other surgical procedures as the cause of abnormal reaction of the patient, or of later complication, without mention of misadventure at the time of the procedure: Secondary | ICD-10-CM | POA: Diagnosis present

## 2018-10-23 DIAGNOSIS — Z96651 Presence of right artificial knee joint: Secondary | ICD-10-CM

## 2018-10-23 DIAGNOSIS — I252 Old myocardial infarction: Secondary | ICD-10-CM | POA: Diagnosis not present

## 2018-10-23 DIAGNOSIS — T84032A Mechanical loosening of internal right knee prosthetic joint, initial encounter: Secondary | ICD-10-CM | POA: Diagnosis present

## 2018-10-23 DIAGNOSIS — J449 Chronic obstructive pulmonary disease, unspecified: Secondary | ICD-10-CM | POA: Diagnosis present

## 2018-10-23 DIAGNOSIS — F329 Major depressive disorder, single episode, unspecified: Secondary | ICD-10-CM | POA: Diagnosis present

## 2018-10-23 DIAGNOSIS — I10 Essential (primary) hypertension: Secondary | ICD-10-CM | POA: Diagnosis present

## 2018-10-23 DIAGNOSIS — E785 Hyperlipidemia, unspecified: Secondary | ICD-10-CM | POA: Diagnosis present

## 2018-10-23 DIAGNOSIS — Z7984 Long term (current) use of oral hypoglycemic drugs: Secondary | ICD-10-CM | POA: Diagnosis not present

## 2018-10-23 DIAGNOSIS — G4730 Sleep apnea, unspecified: Secondary | ICD-10-CM | POA: Diagnosis present

## 2018-10-23 DIAGNOSIS — I2510 Atherosclerotic heart disease of native coronary artery without angina pectoris: Secondary | ICD-10-CM | POA: Diagnosis present

## 2018-10-23 DIAGNOSIS — K5909 Other constipation: Secondary | ICD-10-CM | POA: Diagnosis present

## 2018-10-23 HISTORY — PX: TOTAL KNEE ARTHROPLASTY: SHX125

## 2018-10-23 LAB — GLUCOSE, CAPILLARY
Glucose-Capillary: 90 mg/dL (ref 70–99)
Glucose-Capillary: 106 mg/dL — ABNORMAL HIGH (ref 70–99)
Glucose-Capillary: 182 mg/dL — ABNORMAL HIGH (ref 70–99)

## 2018-10-23 SURGERY — ARTHROPLASTY, KNEE, TOTAL
Anesthesia: General | Site: Knee | Laterality: Right

## 2018-10-23 MED ORDER — ONDANSETRON HCL 4 MG/2ML IJ SOLN: 4.0000 mg | Freq: Once | INTRAMUSCULAR | Status: DC | PRN

## 2018-10-23 MED ORDER — PROPOFOL 500 MG/50ML IV EMUL
INTRAVENOUS | Status: AC
  Filled 2018-10-23: qty 50

## 2018-10-23 MED ORDER — CEFAZOLIN SODIUM-DEXTROSE 2-4 GM/100ML-% IV SOLN
2.0000 g | Freq: Four times a day (QID) | INTRAVENOUS | Status: AC
  Administered 2018-10-23 – 2018-10-24 (×3): 2 g via INTRAVENOUS
  Filled 2018-10-23 (×4): qty 100

## 2018-10-23 MED ORDER — ACETAMINOPHEN 500 MG PO TABS: 500.0000 mg | ORAL_TABLET | Freq: Every evening | ORAL | Status: DC | PRN

## 2018-10-23 MED ORDER — HYDROCODONE-ACETAMINOPHEN 7.5-325 MG PO TABS
1.0000 | ORAL_TABLET | ORAL | Status: DC | PRN
  Administered 2018-10-23 – 2018-10-25 (×4): 1 via ORAL
  Filled 2018-10-23 (×4): qty 1

## 2018-10-23 MED ORDER — BUPIVACAINE HCL (PF) 0.5 % IJ SOLN
INTRAMUSCULAR | Status: AC
  Filled 2018-10-23: qty 30

## 2018-10-23 MED ORDER — DIPHENHYDRAMINE-APAP (SLEEP) 25-500 MG PO TABS: 1.0000 | ORAL_TABLET | Freq: Every evening | ORAL | Status: DC | PRN

## 2018-10-23 MED ORDER — METOCLOPRAMIDE HCL 10 MG PO TABS: 5.0000 mg | ORAL_TABLET | Freq: Three times a day (TID) | ORAL | Status: DC | PRN

## 2018-10-23 MED ORDER — LABETALOL HCL 5 MG/ML IV SOLN
INTRAVENOUS | Status: AC
  Filled 2018-10-23: qty 4

## 2018-10-23 MED ORDER — NITROGLYCERIN 0.4 MG SL SUBL: 0.4000 mg | SUBLINGUAL_TABLET | SUBLINGUAL | Status: DC | PRN

## 2018-10-23 MED ORDER — EPHEDRINE SULFATE 50 MG/ML IJ SOLN
INTRAMUSCULAR | Status: DC | PRN
  Administered 2018-10-23 (×2): 10 mg via INTRAVENOUS

## 2018-10-23 MED ORDER — ACETAMINOPHEN 10 MG/ML IV SOLN
INTRAVENOUS | Status: DC | PRN
  Administered 2018-10-23: 1000 mg via INTRAVENOUS

## 2018-10-23 MED ORDER — ONDANSETRON HCL 4 MG/2ML IJ SOLN
INTRAMUSCULAR | Status: AC
  Filled 2018-10-23: qty 2

## 2018-10-23 MED ORDER — HYDROMORPHONE HCL 1 MG/ML IJ SOLN
INTRAMUSCULAR | Status: DC | PRN
  Administered 2018-10-23: 0.5 mg via INTRAVENOUS
  Administered 2018-10-23: .2 mg via INTRAVENOUS

## 2018-10-23 MED ORDER — METOCLOPRAMIDE HCL 5 MG/ML IJ SOLN: 5.0000 mg | Freq: Three times a day (TID) | INTRAMUSCULAR | Status: DC | PRN

## 2018-10-23 MED ORDER — FAMOTIDINE 20 MG PO TABS
ORAL_TABLET | ORAL | Status: AC
  Administered 2018-10-23: 20 mg via ORAL
  Filled 2018-10-23: qty 1

## 2018-10-23 MED ORDER — LABETALOL HCL 5 MG/ML IV SOLN
INTRAVENOUS | Status: DC | PRN
  Administered 2018-10-23: 5 mg via INTRAVENOUS

## 2018-10-23 MED ORDER — ROCURONIUM BROMIDE 50 MG/5ML IV SOLN
INTRAVENOUS | Status: AC
  Filled 2018-10-23: qty 1

## 2018-10-23 MED ORDER — SUGAMMADEX SODIUM 200 MG/2ML IV SOLN
INTRAVENOUS | Status: DC | PRN
  Administered 2018-10-23: 200 mg via INTRAVENOUS

## 2018-10-23 MED ORDER — ENOXAPARIN SODIUM 40 MG/0.4ML ~~LOC~~ SOLN
40.0000 mg | SUBCUTANEOUS | Status: DC
  Administered 2018-10-24 – 2018-10-25 (×2): 40 mg via SUBCUTANEOUS
  Filled 2018-10-23 (×2): qty 0.4

## 2018-10-23 MED ORDER — BUPIVACAINE-EPINEPHRINE (PF) 0.5% -1:200000 IJ SOLN
INTRAMUSCULAR | Status: DC | PRN
  Administered 2018-10-23: 30 mL via PERINEURAL

## 2018-10-23 MED ORDER — DEXAMETHASONE SODIUM PHOSPHATE 10 MG/ML IJ SOLN
INTRAMUSCULAR | Status: AC
  Filled 2018-10-23: qty 1

## 2018-10-23 MED ORDER — ALPRAZOLAM 0.25 MG PO TABS
0.2500 mg | ORAL_TABLET | Freq: Every day | ORAL | Status: DC
  Administered 2018-10-23 – 2018-10-24 (×2): 0.25 mg via ORAL
  Filled 2018-10-23 (×2): qty 1

## 2018-10-23 MED ORDER — TRANEXAMIC ACID 1000 MG/10ML IV SOLN
INTRAVENOUS | Status: AC
  Filled 2018-10-23: qty 10

## 2018-10-23 MED ORDER — DOCUSATE SODIUM 100 MG PO CAPS
100.0000 mg | ORAL_CAPSULE | Freq: Two times a day (BID) | ORAL | Status: DC
  Administered 2018-10-23 – 2018-10-25 (×4): 100 mg via ORAL
  Filled 2018-10-23 (×4): qty 1

## 2018-10-23 MED ORDER — SODIUM CHLORIDE 0.9 % IV SOLN
INTRAVENOUS | Status: DC | PRN
  Administered 2018-10-23: 60 mL

## 2018-10-23 MED ORDER — DIPHENHYDRAMINE HCL 25 MG PO CAPS: 25.0000 mg | ORAL_CAPSULE | Freq: Every evening | ORAL | Status: DC | PRN

## 2018-10-23 MED ORDER — KETOROLAC TROMETHAMINE 15 MG/ML IJ SOLN
15.0000 mg | Freq: Four times a day (QID) | INTRAMUSCULAR | Status: AC
  Administered 2018-10-23 – 2018-10-24 (×3): 15 mg via INTRAVENOUS
  Filled 2018-10-23 (×3): qty 1

## 2018-10-23 MED ORDER — MIDAZOLAM HCL 5 MG/5ML IJ SOLN
INTRAMUSCULAR | Status: DC | PRN
  Administered 2018-10-23: 2 mg via INTRAVENOUS

## 2018-10-23 MED ORDER — LIDOCAINE HCL (PF) 2 % IJ SOLN
INTRAMUSCULAR | Status: AC
  Filled 2018-10-23: qty 10

## 2018-10-23 MED ORDER — ONDANSETRON HCL 4 MG PO TABS: 4.0000 mg | ORAL_TABLET | Freq: Four times a day (QID) | ORAL | Status: DC | PRN

## 2018-10-23 MED ORDER — FENTANYL CITRATE (PF) 100 MCG/2ML IJ SOLN
25.0000 ug | INTRAMUSCULAR | Status: DC | PRN
  Administered 2018-10-23 (×3): 50 ug via INTRAVENOUS

## 2018-10-23 MED ORDER — MIDAZOLAM HCL 2 MG/2ML IJ SOLN
INTRAMUSCULAR | Status: AC
  Filled 2018-10-23: qty 2

## 2018-10-23 MED ORDER — MAGNESIUM HYDROXIDE 400 MG/5ML PO SUSP
30.0000 mL | Freq: Every day | ORAL | Status: DC | PRN
  Administered 2018-10-24: 30 mL via ORAL
  Filled 2018-10-23: qty 30

## 2018-10-23 MED ORDER — FENTANYL CITRATE (PF) 100 MCG/2ML IJ SOLN
INTRAMUSCULAR | Status: AC
  Filled 2018-10-23: qty 2

## 2018-10-23 MED ORDER — PANTOPRAZOLE SODIUM 40 MG PO TBEC
40.0000 mg | DELAYED_RELEASE_TABLET | Freq: Every day | ORAL | Status: DC
  Administered 2018-10-24 – 2018-10-25 (×2): 40 mg via ORAL
  Filled 2018-10-23 (×2): qty 1

## 2018-10-23 MED ORDER — FLEET ENEMA 7-19 GM/118ML RE ENEM: 1.0000 | ENEMA | Freq: Once | RECTAL | Status: DC | PRN

## 2018-10-23 MED ORDER — BUPIVACAINE LIPOSOME 1.3 % IJ SUSP
INTRAMUSCULAR | Status: AC
  Filled 2018-10-23: qty 20

## 2018-10-23 MED ORDER — FAMOTIDINE 20 MG PO TABS
20.0000 mg | ORAL_TABLET | Freq: Once | ORAL | Status: AC
  Administered 2018-10-23: 20 mg via ORAL

## 2018-10-23 MED ORDER — KETOROLAC TROMETHAMINE 30 MG/ML IJ SOLN
INTRAMUSCULAR | Status: AC
  Filled 2018-10-23: qty 1

## 2018-10-23 MED ORDER — CEFAZOLIN SODIUM-DEXTROSE 2-4 GM/100ML-% IV SOLN
2.0000 g | Freq: Once | INTRAVENOUS | Status: AC
  Administered 2018-10-23: 2 g via INTRAVENOUS

## 2018-10-23 MED ORDER — DIPHENHYDRAMINE HCL 12.5 MG/5ML PO ELIX: 12.5000 mg | ORAL_SOLUTION | ORAL | Status: DC | PRN

## 2018-10-23 MED ORDER — SODIUM CHLORIDE 0.9 % IV SOLN
INTRAVENOUS | Status: DC
  Administered 2018-10-23 – 2018-10-24 (×3): via INTRAVENOUS

## 2018-10-23 MED ORDER — CEFAZOLIN SODIUM-DEXTROSE 2-4 GM/100ML-% IV SOLN
INTRAVENOUS | Status: AC
  Filled 2018-10-23: qty 100

## 2018-10-23 MED ORDER — SEVOFLURANE IN SOLN
RESPIRATORY_TRACT | Status: AC
  Filled 2018-10-23: qty 250

## 2018-10-23 MED ORDER — ALBUTEROL SULFATE (2.5 MG/3ML) 0.083% IN NEBU: 2.5000 mg | INHALATION_SOLUTION | Freq: Four times a day (QID) | RESPIRATORY_TRACT | Status: DC | PRN

## 2018-10-23 MED ORDER — SUCCINYLCHOLINE CHLORIDE 20 MG/ML IJ SOLN
INTRAMUSCULAR | Status: AC
  Filled 2018-10-23: qty 1

## 2018-10-23 MED ORDER — TRAMADOL HCL 50 MG PO TABS
50.0000 mg | ORAL_TABLET | Freq: Four times a day (QID) | ORAL | Status: DC
  Administered 2018-10-23 – 2018-10-25 (×7): 50 mg via ORAL
  Filled 2018-10-23 (×7): qty 1

## 2018-10-23 MED ORDER — PHENYLEPHRINE HCL 10 MG/ML IJ SOLN
INTRAMUSCULAR | Status: AC
  Filled 2018-10-23: qty 1

## 2018-10-23 MED ORDER — MORPHINE SULFATE (PF) 2 MG/ML IV SOLN: 0.5000 mg | INTRAVENOUS | Status: DC | PRN

## 2018-10-23 MED ORDER — SUGAMMADEX SODIUM 200 MG/2ML IV SOLN
INTRAVENOUS | Status: AC
  Filled 2018-10-23: qty 2

## 2018-10-23 MED ORDER — BUPROPION HCL ER (SR) 150 MG PO TB12
150.0000 mg | ORAL_TABLET | Freq: Two times a day (BID) | ORAL | Status: DC
  Administered 2018-10-23 – 2018-10-25 (×4): 150 mg via ORAL
  Filled 2018-10-23 (×5): qty 1

## 2018-10-23 MED ORDER — EPHEDRINE SULFATE 50 MG/ML IJ SOLN
INTRAMUSCULAR | Status: AC
  Filled 2018-10-23: qty 1

## 2018-10-23 MED ORDER — MOMETASONE FURO-FORMOTEROL FUM 200-5 MCG/ACT IN AERO
2.0000 | INHALATION_SPRAY | Freq: Two times a day (BID) | RESPIRATORY_TRACT | Status: DC
  Administered 2018-10-23 – 2018-10-24 (×2): 2 via RESPIRATORY_TRACT
  Filled 2018-10-23 (×2): qty 8.8

## 2018-10-23 MED ORDER — VENLAFAXINE HCL ER 75 MG PO CP24
225.0000 mg | ORAL_CAPSULE | Freq: Every day | ORAL | Status: DC
  Administered 2018-10-24 – 2018-10-25 (×2): 225 mg via ORAL
  Filled 2018-10-23 (×3): qty 1

## 2018-10-23 MED ORDER — SODIUM CHLORIDE 0.9 % IV SOLN
INTRAVENOUS | Status: DC
  Administered 2018-10-23 (×2): via INTRAVENOUS

## 2018-10-23 MED ORDER — EPINEPHRINE PF 1 MG/ML IJ SOLN
INTRAMUSCULAR | Status: AC
  Filled 2018-10-23: qty 1

## 2018-10-23 MED ORDER — PROPOFOL 10 MG/ML IV BOLUS
INTRAVENOUS | Status: DC | PRN
  Administered 2018-10-23: 180 mg via INTRAVENOUS

## 2018-10-23 MED ORDER — BISACODYL 10 MG RE SUPP: 10.0000 mg | Freq: Every day | RECTAL | Status: DC | PRN

## 2018-10-23 MED ORDER — KETOROLAC TROMETHAMINE 30 MG/ML IJ SOLN
INTRAMUSCULAR | Status: DC | PRN
  Administered 2018-10-23: 30 mg via INTRAVENOUS

## 2018-10-23 MED ORDER — DEXAMETHASONE SODIUM PHOSPHATE 10 MG/ML IJ SOLN
INTRAMUSCULAR | Status: DC | PRN
  Administered 2018-10-23: 5 mg via INTRAVENOUS

## 2018-10-23 MED ORDER — ASPIRIN EC 81 MG PO TBEC
81.0000 mg | DELAYED_RELEASE_TABLET | Freq: Every day | ORAL | Status: DC
  Administered 2018-10-24: 81 mg via ORAL
  Filled 2018-10-23: qty 1

## 2018-10-23 MED ORDER — ONDANSETRON HCL 4 MG/2ML IJ SOLN
INTRAMUSCULAR | Status: DC | PRN
  Administered 2018-10-23: 4 mg via INTRAVENOUS

## 2018-10-23 MED ORDER — ATORVASTATIN CALCIUM 20 MG PO TABS
40.0000 mg | ORAL_TABLET | Freq: Every day | ORAL | Status: DC
  Administered 2018-10-23 – 2018-10-24 (×2): 40 mg via ORAL
  Filled 2018-10-23 (×2): qty 2

## 2018-10-23 MED ORDER — SODIUM CHLORIDE (PF) 0.9 % IJ SOLN
INTRAMUSCULAR | Status: AC
  Filled 2018-10-23: qty 50

## 2018-10-23 MED ORDER — ACETAMINOPHEN 10 MG/ML IV SOLN
INTRAVENOUS | Status: AC
  Filled 2018-10-23: qty 100

## 2018-10-23 MED ORDER — LIDOCAINE HCL (CARDIAC) PF 100 MG/5ML IV SOSY
PREFILLED_SYRINGE | INTRAVENOUS | Status: DC | PRN
  Administered 2018-10-23: 60 mg via INTRAVENOUS

## 2018-10-23 MED ORDER — NEOMYCIN-POLYMYXIN B GU 40-200000 IR SOLN
Status: AC
  Filled 2018-10-23: qty 20

## 2018-10-23 MED ORDER — NEOMYCIN-POLYMYXIN B GU 40-200000 IR SOLN
Status: DC | PRN
  Administered 2018-10-23: 2 mL

## 2018-10-23 MED ORDER — METFORMIN HCL 850 MG PO TABS
850.0000 mg | ORAL_TABLET | Freq: Every day | ORAL | Status: DC
  Administered 2018-10-24: 850 mg via ORAL
  Filled 2018-10-23 (×2): qty 1

## 2018-10-23 MED ORDER — FENTANYL CITRATE (PF) 100 MCG/2ML IJ SOLN
INTRAMUSCULAR | Status: DC | PRN
  Administered 2018-10-23 (×4): 50 ug via INTRAVENOUS

## 2018-10-23 MED ORDER — KETOROLAC TROMETHAMINE 30 MG/ML IJ SOLN: 30.0000 mg | Freq: Once | INTRAMUSCULAR | Status: DC

## 2018-10-23 MED ORDER — SUCCINYLCHOLINE CHLORIDE 20 MG/ML IJ SOLN
INTRAMUSCULAR | Status: DC | PRN
  Administered 2018-10-23: 120 mg via INTRAVENOUS

## 2018-10-23 MED ORDER — ONDANSETRON HCL 4 MG/2ML IJ SOLN: 4.0000 mg | Freq: Four times a day (QID) | INTRAMUSCULAR | Status: DC | PRN

## 2018-10-23 MED ORDER — FENTANYL CITRATE (PF) 100 MCG/2ML IJ SOLN
INTRAMUSCULAR | Status: AC
  Administered 2018-10-23: 50 ug via INTRAVENOUS
  Filled 2018-10-23: qty 2

## 2018-10-23 MED ORDER — ROCURONIUM BROMIDE 100 MG/10ML IV SOLN
INTRAVENOUS | Status: DC | PRN
  Administered 2018-10-23: 10 mg via INTRAVENOUS
  Administered 2018-10-23: 25 mg via INTRAVENOUS
  Administered 2018-10-23: 5 mg via INTRAVENOUS

## 2018-10-23 MED ORDER — INFLUENZA VAC SPLIT QUAD 0.5 ML IM SUSY
0.5000 mL | PREFILLED_SYRINGE | INTRAMUSCULAR | Status: AC
  Administered 2018-10-25: 0.5 mL via INTRAMUSCULAR
  Filled 2018-10-23: qty 0.5

## 2018-10-23 MED ORDER — HYDROMORPHONE HCL 1 MG/ML IJ SOLN
INTRAMUSCULAR | Status: AC
  Filled 2018-10-23: qty 1

## 2018-10-23 MED ORDER — TRANEXAMIC ACID 1000 MG/10ML IV SOLN
INTRAVENOUS | Status: DC | PRN
  Administered 2018-10-23: 1000 mg via INTRAVENOUS

## 2018-10-23 MED ORDER — LORATADINE 10 MG PO TABS
10.0000 mg | ORAL_TABLET | Freq: Every day | ORAL | Status: DC
  Administered 2018-10-24 – 2018-10-25 (×2): 10 mg via ORAL
  Filled 2018-10-23 (×2): qty 1

## 2018-10-23 MED ORDER — ACETAMINOPHEN 325 MG PO TABS: 325.0000 mg | ORAL_TABLET | Freq: Four times a day (QID) | ORAL | Status: DC | PRN

## 2018-10-23 MED ORDER — ACETAMINOPHEN 500 MG PO TABS
500.0000 mg | ORAL_TABLET | Freq: Four times a day (QID) | ORAL | Status: AC
  Administered 2018-10-23 – 2018-10-24 (×3): 500 mg via ORAL
  Filled 2018-10-23 (×5): qty 1

## 2018-10-23 MED ORDER — MIDODRINE HCL 5 MG PO TABS
20.0000 mg | ORAL_TABLET | Freq: Every day | ORAL | Status: DC
  Filled 2018-10-23: qty 4

## 2018-10-23 MED ORDER — EZETIMIBE 10 MG PO TABS
10.0000 mg | ORAL_TABLET | Freq: Every day | ORAL | Status: DC
  Administered 2018-10-24 – 2018-10-25 (×2): 10 mg via ORAL
  Filled 2018-10-23 (×3): qty 1

## 2018-10-23 MED ORDER — TAMSULOSIN HCL 0.4 MG PO CAPS
0.4000 mg | ORAL_CAPSULE | Freq: Every day | ORAL | Status: DC
  Administered 2018-10-23 – 2018-10-24 (×2): 0.4 mg via ORAL
  Filled 2018-10-23 (×2): qty 1

## 2018-10-23 SURGICAL SUPPLY — 60 items
BANDAGE ELASTIC 6 LF NS (GAUZE/BANDAGES/DRESSINGS) ×3 IMPLANT
BLADE SAW SAG 25X90X1.19 (BLADE) ×3 IMPLANT
BLADE SURG SZ20 CARB STEEL (BLADE) ×3 IMPLANT
CANISTER SUCT 1200ML W/VALVE (MISCELLANEOUS) ×3 IMPLANT
CANISTER SUCT 3000ML PPV (MISCELLANEOUS) ×3 IMPLANT
CEMENT BONE R 1X40 (Cement) ×6 IMPLANT
CEMENT VACUUM MIXING SYSTEM (MISCELLANEOUS) ×3 IMPLANT
CHLORAPREP W/TINT 26ML (MISCELLANEOUS) ×3 IMPLANT
COOLER POLAR GLACIER W/PUMP (MISCELLANEOUS) ×3 IMPLANT
COVER MAYO STAND STRL (DRAPES) ×3 IMPLANT
COVER WAND RF STERILE (DRAPES) ×3 IMPLANT
CUFF TOURN 24 STER (MISCELLANEOUS) IMPLANT
CUFF TOURN 30 STER DUAL PORT (MISCELLANEOUS) ×3 IMPLANT
DRAPE IMP U-DRAPE 54X76 (DRAPES) ×3 IMPLANT
DRAPE INCISE IOBAN 66X45 STRL (DRAPES) ×3 IMPLANT
DRAPE SHEET LG 3/4 BI-LAMINATE (DRAPES) ×3 IMPLANT
DRSG OPSITE POSTOP 4X10 (GAUZE/BANDAGES/DRESSINGS) ×3 IMPLANT
DRSG OPSITE POSTOP 4X8 (GAUZE/BANDAGES/DRESSINGS) IMPLANT
ELECT CAUTERY BLADE 6.4 (BLADE) ×3 IMPLANT
ELECT REM PT RETURN 9FT ADLT (ELECTROSURGICAL) ×3
ELECTRODE REM PT RTRN 9FT ADLT (ELECTROSURGICAL) ×1 IMPLANT
FEMORAL COMPONENT 70MM RIGHT (Joint) ×3 IMPLANT
GLOVE BIO SURGEON STRL SZ7.5 (GLOVE) ×12 IMPLANT
GLOVE BIO SURGEON STRL SZ8 (GLOVE) ×18 IMPLANT
GLOVE BIOGEL PI IND STRL 8 (GLOVE) ×1 IMPLANT
GLOVE BIOGEL PI INDICATOR 8 (GLOVE) ×2
GLOVE INDICATOR 8.0 STRL GRN (GLOVE) ×3 IMPLANT
GOWN STRL REUS W/ TWL LRG LVL3 (GOWN DISPOSABLE) ×1 IMPLANT
GOWN STRL REUS W/ TWL XL LVL3 (GOWN DISPOSABLE) ×1 IMPLANT
GOWN STRL REUS W/TWL LRG LVL3 (GOWN DISPOSABLE) ×2
GOWN STRL REUS W/TWL XL LVL3 (GOWN DISPOSABLE) ×2
HOLDER FOLEY CATH W/STRAP (MISCELLANEOUS) IMPLANT
HOOD PEEL AWAY FLYTE STAYCOOL (MISCELLANEOUS) ×9 IMPLANT
IMMBOLIZER KNEE 19 BLUE UNIV (SOFTGOODS) ×3 IMPLANT
KIT TURNOVER KIT A (KITS) ×3 IMPLANT
KNEE AS TIBIAL BEARING 18M 75M (Orthopedic Implant) ×3 IMPLANT
NDL SAFETY ECLIPSE 18X1.5 (NEEDLE) ×2 IMPLANT
NEEDLE HYPO 18GX1.5 SHARP (NEEDLE) ×4
NEEDLE SPNL 20GX3.5 QUINCKE YW (NEEDLE) ×3 IMPLANT
NS IRRIG 1000ML POUR BTL (IV SOLUTION) ×3 IMPLANT
PACK TOTAL KNEE (MISCELLANEOUS) ×3 IMPLANT
PAD WRAPON POLAR KNEE (MISCELLANEOUS) ×1 IMPLANT
PATELLA STD 34X8.5 (Orthopedic Implant) ×3 IMPLANT
PLATE KNEE TIBIAL 75MM FIXED (Plate) ×3 IMPLANT
PULSAVAC PLUS IRRIG FAN TIP (DISPOSABLE) ×3
SOL .9 NS 3000ML IRR  AL (IV SOLUTION) ×2
SOL .9 NS 3000ML IRR UROMATIC (IV SOLUTION) ×1 IMPLANT
STAPLER SKIN PROX 35W (STAPLE) ×3 IMPLANT
SUCTION FRAZIER HANDLE 10FR (MISCELLANEOUS) ×2
SUCTION TUBE FRAZIER 10FR DISP (MISCELLANEOUS) ×1 IMPLANT
SUT VIC AB 0 CT1 36 (SUTURE) ×9 IMPLANT
SUT VIC AB 2-0 CT1 27 (SUTURE) ×6
SUT VIC AB 2-0 CT1 TAPERPNT 27 (SUTURE) ×3 IMPLANT
SWAB DUAL CULTURE TRANS RED ST (MISCELLANEOUS) ×3 IMPLANT
SYR 10ML LL (SYRINGE) ×3 IMPLANT
SYR 20CC LL (SYRINGE) ×3 IMPLANT
SYR 30ML LL (SYRINGE) ×9 IMPLANT
TIP FAN IRRIG PULSAVAC PLUS (DISPOSABLE) ×1 IMPLANT
TRAY FOLEY MTR SLVR 16FR STAT (SET/KITS/TRAYS/PACK) IMPLANT
WRAPON POLAR PAD KNEE (MISCELLANEOUS) ×3

## 2018-10-23 NOTE — Transfer of Care (Signed)
Immediate Anesthesia Transfer of Care Note  Patient: [name]  Procedure(s) Performed: CONVERSION OF PARTIAL TO TOTAL KNEE ARTHROPLASTY (Right Knee)  Patient Location: PACU  Anesthesia Type:General  Level of Consciousness: sedated  Airway & Oxygen Therapy: Patient Spontanous Breathing and Patient connected to face mask oxygen  Post-op Assessment: Report given to RN and Post -op Vital signs reviewed and stable  Post vital signs: Reviewed and stable  Last Vitals:  Vitals Value Taken Time  BP 91/62 10/23/2018  4:37 PM  Temp    Pulse 67 10/23/2018  4:46 PM  Resp 12 10/23/2018  4:46 PM  SpO2 99 % 10/23/2018  4:46 PM  Vitals shown include unvalidated device data.  Last Pain:  Vitals:   10/23/18 1003  TempSrc: Temporal  PainSc: 0-No pain         Complications: No apparent anesthesia complications

## 2018-10-23 NOTE — Anesthesia Post-op Follow-up Note (Signed)
Anesthesia QCDR form completed.        

## 2018-10-23 NOTE — Op Note (Signed)
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  Surgeon:   Maryagnes Amos, MD  Assistant:   Horris Latino, PA-C   Anesthesia:   GET  Findings:   As above  Complications:   None  EBL:   5 cc  Fluids:   1700 cc crystalloid  UOP:   None  TT:   120 minutes at 300 mmHg  Drains:   None  Closure:   Staples  Implants:   As above  Brief Clinical Note:   The patient is a 62 year old male who is now 15 months status post a right partial knee replacement for degenerative joint disease of the medial compartment of his right knee. Initially, the patient did well following his surgery. Over the past 4 to 6 months, he has noted increasing medial sided right knee pain. Follow-up radiographs demonstrate the presence of subsidence of the medial tibial tray. Preoperative lab work was negative for any evidence of infection. The patient's history and examination are consistent with aseptic loosening of the tibial component following his partial knee replacement. The patient presents at this time for a revision right total knee arthroplasty.  Procedure:   The patient was brought into the operating room and lain in the supine position. The patient underwent general endotracheal intubation and anesthesia before the right lower extremity was prepped with ChloraPrep solution and draped sterilely. Preoperative antibiotics were administered. After verifying the proper laterality with a surgical timeout, the limb was exsanguinated with an Esmarch and the tourniquet inflated to 300 mmHg. A standard anterior approach to the knee was made through an approximately 7 inch  incision incorporating his prior anterior incision. The incision was carried down through the subcutaneous tissues to expose superficial retinaculum. This was split the length of the incision and the medial flap elevated sufficiently to expose the medial retinaculum. The medial retinaculum was incised, leaving a 3-4 mm cuff of tissue on the patella. This was extended distally along the medial border of the patellar tendon and proximally through the medial third of the quadriceps tendon. A subtotal fat pad excision was performed before the soft tissues were elevated off the anteromedial and anterolateral aspects of the proximal tibia to the level of the collateral ligaments. The meniscal bearing was removed before the femoral component was removed using quarter-inch osteotomes. The tibial component also was removed using a quarter-inch osteotome. Any retained cement was removed as well. A culture was obtained prior to removal of the components. The anterior portion of the lateral meniscus was removed, as was the anterior cruciate ligament. With the knee flexed to 90, the external tibial guide was positioned and the appropriate proximal tibial cut made, removing just enough bone medially to freshen this surface. The cut surface was measured and found to be opting replicated by a 75 mm tibial tray.  Attention was directed to the distal femur. The intramedullary canal was accessed through a 3/8" drill hole. The intramedullary guide was inserted and positioned in order to obtain a neutral flexion gap. The intercondylar block was positioned with care taken to avoid notching the anterior cortex of the  femur. The appropriate cut was made. Next, the distal cutting block was placed at 5 of valgus alignment. Using the 9 mm slot, the distal cut was made. The distal femur was measured and found to be optimally replicated by the 70 mm component. The 70 mm 4-in-1 cutting block was positioned and first the posterior, then the  posterior chamfer, the anterior chamfer, and finally the anterior cuts were made. At this point, the posterior portions medial and lateral menisci were removed. A trial reduction was performed using the appropriate femoral and tibial components with the 14 mm and 16 mm inserts. The 16mm insert demonstrated good stability to varus and valgus stressing both in flexion and extension while permitting full extension. Patella tracking was assessed and found to be excellent. Therefore, the tibial guide position was marked on the proximal tibia. The patella thickness was measured and found to be 23 mm. Therefore, the appropriate cut was made. The patellar surface was measured and found to be optimally replicated by the 34 mm component. The three peg holes were drilled in place before the trial button was inserted. Patella tracking was assessed and found to be excellent, passing the "no thumb test". The lug holes were drilled into the distal femur before the trial component was removed, leaving only the tibial tray. The keel was then created using the appropriate tower, reamer, and punch.  The bony surfaces were prepared for cementing by irrigating them thoroughly with bacitracin saline solution via the jet lavage system. A bone plug was fashioned from some of the bone that had been removed previously and used to plug the distal femoral canal. In addition, 20 cc of Exparel diluted out to 60 cc with normal saline and 30 cc of 0.5% Sensorcaine were injected into the postero-medial and postero-lateral aspects of the knee, the medial and lateral gutter regions, and the peri-incisional tissues to help with postoperative analgesia. Meanwhile, the cement was being mixed on the back table. When it was ready, the tibial tray was cemented in first. The excess cement was removed using Personal assistant. Next, the femoral component was impacted into place. Again, the excess cement was removed using Personal assistant. The 16 mm trial  insert was positioned and the knee brought into extension while the cement hardened. Finally, the patella was cemented into place and secured using the patellar clamp. Again, the excess cement was removed using Personal assistant. Once the cement had hardened, the knee was placed through a range of motion. The knee was stable to varus and valgus stressing but appeared slightly loose in mid flexion and also extended into about 5 degrees of hyperextension. Therefore, it was elected to trial an 18 mm anterior stabilized insert. This demonstrated excellent stability to varus and valgus stressing both in flexion and extension, and demonstrated excellent mid range stability. Therefore, the trial insert was removed and, after verifying that no cement had been retained posteriorly, the permanent 18 mm anterior stabilized E-polyethylene insert was positioned and secured using the appropriate key locking mechanism. Again the knee was placed through a range of motion with the findings as described above.  The wound was copiously irrigated with bacitracin saline solution using the jet lavage system before the quadriceps tendon and retinacular layer were reapproximated using #0 Vicryl interrupted sutures. The superficial retinacular layer also was closed using a running #0 Vicryl suture. A total of 10 cc of transexemic acid (TXA) was injected intra-articularly before the subcutaneous tissues were closed in several layers using 2-0 Vicryl interrupted  sutures. The skin was closed using staples. A sterile honeycomb dressing was applied to the skin before the leg was wrapped with an Ace wrap to accommodate the Polar Care device. The patient was then awakened and returned to the recovery room in satisfactory condition after tolerating the procedure well.

## 2018-10-23 NOTE — Anesthesia Postprocedure Evaluation (Signed)
Anesthesia Post Note  Patient: [name]  Procedure(s) Performed: CONVERSION OF PARTIAL TO TOTAL KNEE ARTHROPLASTY (Right Knee)  Patient location during evaluation: PACU Anesthesia Type: General Level of consciousness: awake and alert Pain management: pain level controlled Vital Signs Assessment: post-procedure vital signs reviewed and stable Respiratory status: spontaneous breathing, nonlabored ventilation, respiratory function stable and patient connected to nasal cannula oxygen Cardiovascular status: blood pressure returned to baseline and stable Postop Assessment: no apparent nausea or vomiting Anesthetic complications: no     Last Vitals:  Vitals:   10/23/18 1752 10/23/18 1916  BP: 118/85 (!) 138/97  Pulse: 73 70  Resp: 20 19  Temp:  (!) 36.4 C  SpO2: 98% 100%    Last Pain:  Vitals:   10/23/18 1916  TempSrc: Oral  PainSc:                  Cleda MccreedyJoseph K Olney Monier

## 2018-10-23 NOTE — H&P (Signed)
Paper H&P to be scanned into permanent record. H&P reviewed and patient re-examined. No changes. 

## 2018-10-23 NOTE — Anesthesia Procedure Notes (Signed)
Procedure Name: Intubation Date/Time: 10/23/2018 1:54 PM Performed by: Dionne Bucy, CRNA Pre-anesthesia Checklist: Patient identified, Patient being monitored, Timeout performed, Emergency Drugs available and Suction available Patient Re-evaluated:Patient Re-evaluated prior to induction Oxygen Delivery Method: Circle system utilized Preoxygenation: Pre-oxygenation with 100% oxygen Induction Type: IV induction Ventilation: Mask ventilation without difficulty Laryngoscope Size: Mac and 4 Grade View: Grade I Tube type: Oral Tube size: 7.0 mm Number of attempts: 1 Airway Equipment and Method: Stylet Placement Confirmation: ETT inserted through vocal cords under direct vision,  positive ETCO2 and breath sounds checked- equal and bilateral Secured at: 21 cm Tube secured with: Tape Dental Injury: Teeth and Oropharynx as per pre-operative assessment

## 2018-10-23 NOTE — Anesthesia Preprocedure Evaluation (Addendum)
Anesthesia Evaluation  Patient identified by MRN, date of birth, ID band Patient awake    Reviewed: Allergy & Precautions, NPO status , Patient's Chart, lab work & pertinent test results  History of Anesthesia Complications Negative for: history of anesthetic complications  Airway Mallampati: III       Dental   Pulmonary sleep apnea , COPD,           Cardiovascular hypertension, Pt. on medications + CAD and + Past MI  (-) dysrhythmias (-) Valvular Problems/Murmurs     Neuro/Psych neg Seizures Anxiety Depression    GI/Hepatic Neg liver ROS, GERD  Medicated and Controlled,  Endo/Other  diabetes, Type 2, Oral Hypoglycemic Agents  Renal/GU negative Renal ROS     Musculoskeletal   Abdominal   Peds  Hematology   Anesthesia Other Findings   Reproductive/Obstetrics                            Anesthesia Physical Anesthesia Plan  ASA: III  Anesthesia Plan: General   Post-op Pain Management:    Induction: Intravenous  PONV Risk Score and Plan: 1 and Dexamethasone and Ondansetron  Airway Management Planned: Oral ETT  Additional Equipment:   Intra-op Plan:   Post-operative Plan:   Informed Consent: I have reviewed the patients History and Physical, chart, labs and discussed the procedure including the risks, benefits and alternatives for the proposed anesthesia with the patient or authorized representative who has indicated his/her understanding and acceptance.     Plan Discussed with:   Anesthesia Plan Comments: (Pt off plavix only 4 days unable to do spinal. Will proceed with GETA, Pt and surgeon aware and desire to proceed.)        Anesthesia Quick Evaluation

## 2018-10-24 ENCOUNTER — Encounter: Payer: Self-pay | Admitting: Surgery

## 2018-10-24 LAB — BASIC METABOLIC PANEL
Anion gap: 7 (ref 5–15)
BUN: 12 mg/dL (ref 8–23)
CO2: 24 mmol/L (ref 22–32)
Calcium: 7.9 mg/dL — ABNORMAL LOW (ref 8.9–10.3)
Chloride: 106 mmol/L (ref 98–111)
Creatinine, Ser: 1.01 mg/dL (ref 0.61–1.24)
GFR calc non Af Amer: >60 mL/min (ref >60)
Glucose, Bld: 125 mg/dL — ABNORMAL HIGH (ref 70–99)
Potassium: 4.3 mmol/L (ref 3.5–5.1)
Sodium: 137 mmol/L (ref 135–145)

## 2018-10-24 LAB — CBC WITH DIFFERENTIAL/PLATELET
Abs Immature Granulocytes: 0.04 10*3/uL (ref 0.00–0.07)
Basophils Absolute: 0 10*3/uL (ref 0.0–0.1)
Basophils Relative: 0 %
Eosinophils Absolute: 0 10*3/uL (ref 0.0–0.5)
Eosinophils Relative: 0 %
HEMATOCRIT: 37.8 % — AB (ref 39.0–52.0)
Hemoglobin: 12.2 g/dL — ABNORMAL LOW (ref 13.0–17.0)
Immature Granulocytes: 1 %
Lymphocytes Relative: 4 %
Lymphs Abs: 0.4 10*3/uL — ABNORMAL LOW (ref 0.7–4.0)
MCH: 26.9 pg (ref 26.0–34.0)
MCHC: 32.3 g/dL (ref 30.0–36.0)
MCV: 83.4 fL (ref 80.0–100.0)
Monocytes Absolute: 0.5 10*3/uL (ref 0.1–1.0)
Monocytes Relative: 6 %
Neutro Abs: 7.1 10*3/uL (ref 1.7–7.7)
Neutrophils Relative %: 89 %
Platelets: 125 10*3/uL — ABNORMAL LOW (ref 150–400)
RBC: 4.53 MIL/uL (ref 4.22–5.81)
RDW: 13.7 % (ref 11.5–15.5)
WBC: 8.1 10*3/uL (ref 4.0–10.5)
nRBC: 0 % (ref 0.0–0.2)

## 2018-10-24 LAB — GLUCOSE, CAPILLARY
GLUCOSE-CAPILLARY: 77 mg/dL (ref 70–99)
GLUCOSE-CAPILLARY: 62 mg/dL — AB (ref 70–99)
Glucose-Capillary: 97 mg/dL (ref 70–99)
Glucose-Capillary: 77 mg/dL (ref 70–99)

## 2018-10-24 NOTE — Progress Notes (Signed)
Hypoglycemic Event  CBG: 62  Treatment: 15 GM carbohydrate snack  Symptoms: None  Follow-up CBG: Time:1803 CBG Result: 77  Possible Reasons for Event: Unknown   Lowella DellHudson, Darielys Giglia G

## 2018-10-24 NOTE — Progress Notes (Signed)
  Subjective: 1 Day Post-Op Procedure(s) (LRB): CONVERSION OF PARTIAL TO TOTAL KNEE ARTHROPLASTY (Right) Patient reports pain as mild.   Patient is well, and has had no acute complaints or problems PT and care management to assist with discharge planning. Negative for chest pain and shortness of breath Fever: no Gastrointestinal:Negative for nausea and vomiting  Objective: Vital signs in last 24 hours: Temp:  [96.8 F (36 C)-97.8 F (36.6 C)] 97.4 F (36.3 C) (12/03 0335) Pulse Rate:  [58-93] 71 (12/03 0335) Resp:  [8-20] 19 (12/03 0335) BP: (91-151)/(60-97) 136/81 (12/03 0335) SpO2:  [90 %-100 %] 99 % (12/03 0335) Weight:  [100.7 kg] 100.7 kg (12/02 1003)  Intake/Output from previous day:  Intake/Output Summary (Last 24 hours) at 10/24/2018 0738 Last data filed at 10/24/2018 0700 Gross per 24 hour  Intake 2653.79 ml  Output 705 ml  Net 1948.79 ml    Intake/Output this shift: No intake/output data recorded.  Labs: Recent Labs    10/24/18 0329  HGB 12.2*   Recent Labs    10/24/18 0329  WBC 8.1  RBC 4.53  HCT 37.8*  PLT 125*   Recent Labs    10/24/18 0329  NA 137  K 4.3  CL 106  CO2 24  BUN 12  CREATININE 1.01  GLUCOSE 125*  CALCIUM 7.9*   No results for input(s): LABPT, INR in the last 72 hours.   EXAM General - Patient is Alert, Appropriate and Oriented Extremity - ABD soft Neurovascular intact Sensation intact distally Intact pulses distally Dorsiflexion/Plantar flexion intact Incision: scant drainage No cellulitis present Dressing/Incision - blood tinged drainage Motor Function - intact, moving foot and toes well on exam.  Abdomen soft with normal BS this AM.  Past Medical History:  Diagnosis Date  . Anxiety   . Atherosclerosis of coronary artery bypass graft with angina pectoris (HCC)   . Barrett's esophagus   . Benign enlargement of prostate   . Chronic constipation   . Colon polyp   . Coronary artery disease   . Depression   .  GERD (gastroesophageal reflux disease)   . H/O esophageal spasm   . Hyperlipidemia   . Hypertension   . MI (myocardial infarction) (HCC) 2011 & 2014   x 2  . Sleep apnea     after weight loss no need for CPAP  . Type 2 diabetes mellitus (HCC)     Assessment/Plan: 1 Day Post-Op Procedure(s) (LRB): CONVERSION OF PARTIAL TO TOTAL KNEE ARTHROPLASTY (Right) Active Problems:   Status post total knee replacement using cement, right  Estimated body mass index is 33.76 kg/m as calculated from the following:   Height as of this encounter: 5\' 8"  (1.727 m).   Weight as of this encounter: 100.7 kg. Advance diet Up with therapy D/C IV fluids when tolerating po intake.  Labs reviewed this AM. Up with therapy today. Currently passing gas, will work on BM. Possible d/c home tomorrow. Currently on Lovenox will transition back to home dose of Plavix.  DVT Prophylaxis - Lovenox, Foot Pumps and TED hose Weight-Bearing as tolerated to right leg  J. Horris LatinoLance Melanny Wire, PA-C [facility] 10/24/2018, 7:38 AM

## 2018-10-24 NOTE — Progress Notes (Signed)
Physical Therapy Treatment Patient Details Name: [name] MRN: [id number] DOB: [birth year] Today's Date: 10/24/2018    History of Present Illness 62 y/o male who had partial R knee replacement last year, now here with revision to total knee replacement 12/2.    PT Comments    Pt doing exceptionally well with all aspects of POD1 PT including ability to easily negotiate up/down steps, circumambulate the nurses' station (with consistent speed, cadence, minimal UE use), good confidence with mobility and ROM/strength exceeding typical POD1 expectations.   Pt very eager to work hard with PT and showed great attitude and effort.  Pt should be ready to d/c tomorrow per medical status.    Follow Up Recommendations  Home health PT     Equipment Recommendations  None recommended by PT    Recommendations for Other Services       Precautions / Restrictions Precautions Precautions: Knee;Fall Restrictions Weight Bearing Restrictions: Yes RLE Weight Bearing: Weight bearing as tolerated    Mobility  Bed Mobility Overal bed mobility: Independent             General bed mobility comments: Pt easily gets LEs back into bed from sitting  Transfers Overall transfer level: Independent Equipment used: Rolling walker (2 wheeled)             General transfer comment: Pt actually able to rise to standing and return to sitting w/o needing AD.    Ambulation/Gait Ambulation/Gait assistance: Modified independent (Device/Increase time) Gait Distance (Feet): 250 Feet Assistive device: Rolling walker (2 wheeled)       General Gait Details: Pt was able to maintain consistent and confident cadence with minimal UE use on the walker and no hesitation with WBing or proper mechanics.  Overall did well with no fatigue, increased pain or safety issues.    Stairs Stairs: Yes Stairs assistance: Independent Stair Management: One rail Right;Sideways Number of Stairs: 4 General stair  comments: Pt easily negotiated up/down 4 steps with single rail and only very minimal cuing   Wheelchair Mobility    Modified Rankin (Stroke Patients Only)       Balance Overall balance assessment: Modified Independent                                          Cognition Arousal/Alertness: Awake/alert Behavior During Therapy: WFL for tasks assessed/performed Overall Cognitive Status: Within Functional Limits for tasks assessed                                        Exercises Total Joint Exercises Ankle Circles/Pumps: AROM;10 reps Quad Sets: Strengthening;10 reps Short Arc Quad: Strengthening;10 reps Heel Slides: Strengthening;10 reps Hip ABduction/ADduction: Strengthening;10 reps Straight Leg Raises: Strengthening;10 reps Knee Flexion: PROM;5 reps Goniometric ROM: 0-95    General Comments        Pertinent Vitals/Pain Pain Assessment: 0-10 Pain Score: 2     Home Living Family/patient expects to be discharged to:: Private residence Living Arrangements: Spouse/significant other Available Help at Discharge: Family Type of Home: House Home Access: Stairs to enter     Home Equipment: Environmental consultant - 2 wheels      Prior Function Level of Independence: Independent      Comments: Pt able to manage, but has had issues with R knee recently  PT Goals (current goals can now be found in the care plan section) Acute Rehab PT Goals Patient Stated Goal: Go home and get back to normal activity PT Goal Formulation: With patient Time For Goal Achievement: 11/07/18 Potential to Achieve Goals: Good Progress towards PT goals: Progressing toward goals    Frequency    BID      PT Plan Current plan remains appropriate    Co-evaluation              AM-PAC PT "6 Clicks" Mobility   Outcome Measure  Help needed turning from your back to your side while in a flat bed without using bedrails?: None Help needed moving from lying on your  back to sitting on the side of a flat bed without using bedrails?: None Help needed moving to and from a bed to a chair (including a wheelchair)?: None Help needed standing up from a chair using your arms (e.g., wheelchair or bedside chair)?: None Help needed to walk in hospital room?: None Help needed climbing 3-5 steps with a railing? : None 6 Click Score: 24    End of Session Equipment Utilized During Treatment: Gait belt Activity Tolerance: Patient tolerated treatment well Patient left: with call bell/phone within reach;with family/visitor present;with bed alarm set Nurse Communication: Mobility status PT Visit Diagnosis: Muscle weakness (generalized) (M62.81);Difficulty in walking, not elsewhere classified (R26.2)     Time: [id number] PT Time Calculation (min) (ACUTE ONLY): 31 min  Charges:  $Gait Training: 8-22 mins $Therapeutic Exercise: 8-22 mins                     Malachi ProGalen R Amadu Schlageter, DPT 10/24/2018, 1:33 PM

## 2018-10-24 NOTE — Progress Notes (Signed)
Clinical Social Worker (CSW) received SNF consult. PT is recommending home health. RN case manager aware of above. Please reconsult if future social work needs arise. CSW signing off.   Merlin Golden, LCSW [phone] 

## 2018-10-24 NOTE — [place] FL2 (Signed)
[place] MEDICAID FL2 LEVEL OF CARE SCREENING TOOL     IDENTIFICATION  Patient Name: [name] Birthdate: 08/05/1956 Sex: male Admission Date (Current Location): 10/23/2018  [place]   Facility and Address:  [facility], [address], [place], [place] [address]      Provider Number: [id number]  Attending Physician Name and Address:  Christena FlakePoggi, John J, MD  Relative Name and Phone Number:       Current Level of Care: Hospital Recommended Level of Care: Skilled Nursing Facility Prior Approval Number:    Date Approved/Denied:   PASRR Number: ([id number] [id number])  Discharge Plan: SNF    Current Diagnoses: Patient Active Problem List   Diagnosis Date Noted  . Status post total knee replacement using cement, right 10/23/2018  . Status post unicompartmental knee replacement, right 07/19/2017  . Viral URI with cough 11/10/2015  . Falls 08/21/2015  . Coronary artery disease 08/21/2015  . Sleep apnea 03/26/2015  . Chest discomfort 01/28/2015  . Shortness of breath 01/28/2015  . S/P CABG x 2 01/28/2015  . Hyperlipidemia 01/28/2015  . Type 2 diabetes mellitus with other circulatory complications (HCC) 01/28/2015  . Morbid obesity (HCC) 01/28/2015  . Orthostatic hypotension 01/28/2015    Orientation RESPIRATION BLADDER Height & Weight     Self, Time, Situation, Place  O2(2 Liters Oxygen. ) Continent Weight: 222 lb 0.1 oz (100.7 kg) Height:  5\' 8"  (172.7 cm)  BEHAVIORAL SYMPTOMS/MOOD NEUROLOGICAL BOWEL NUTRITION STATUS      Continent Diet(Diet: Carb Modified. )  AMBULATORY STATUS COMMUNICATION OF NEEDS Skin   Extensive Assist Verbally Surgical wounds(Incision: Right Leg. )                       Personal Care Assistance Level of Assistance  Bathing, Feeding, Dressing Bathing Assistance: Limited assistance Feeding assistance: Independent Dressing Assistance: Limited assistance     Functional Limitations Info   Sight, Hearing, Speech Sight Info: Adequate Hearing Info: Adequate Speech Info: Adequate    SPECIAL CARE FACTORS FREQUENCY  PT (By licensed PT), OT (By licensed OT)     PT Frequency: (5) OT Frequency: (5)            Contractures      Additional Factors Info  Code Status, Allergies Code Status Info: (Full Code. ) Allergies Info: (Oxycodone)           Current Medications (10/24/2018):  This is the current hospital active medication list Current Facility-Administered Medications  Medication Dose Route Frequency Provider Last Rate Last Dose  . 0.9 %  sodium chloride infusion   Intravenous Continuous Poggi, Excell SeltzerJohn J, MD 75 mL/hr at 10/24/18 [phone]    . acetaminophen (TYLENOL) tablet 325-650 mg  325-650 mg Oral Q6H PRN Poggi, Excell SeltzerJohn J, MD      . acetaminophen (TYLENOL) tablet 500 mg  500 mg Oral Q6H Poggi, Excell SeltzerJohn J, MD   500 mg at 10/24/18 [id number]  . diphenhydrAMINE (BENADRYL) capsule 25 mg  25 mg Oral QHS PRN Poggi, Excell SeltzerJohn J, MD       And  . acetaminophen (TYLENOL) tablet 500 mg  500 mg Oral QHS PRN Poggi, Excell SeltzerJohn J, MD      . albuterol (PROVENTIL) (2.5 MG/3ML) 0.083% nebulizer solution 2.5 mg  2.5 mg Inhalation Q6H PRN Poggi, Excell SeltzerJohn J, MD      . ALPRAZolam Prudy Feeler(XANAX) tablet 0.25 mg  0.25 mg Oral QHS Poggi, Excell SeltzerJohn J, MD   0.25 mg at  10/23/18 2134  . aspirin EC tablet 81 mg  81 mg Oral QHS Poggi, Excell Seltzer, MD      . atorvastatin (LIPITOR) tablet 40 mg  40 mg Oral QHS Poggi, Excell Seltzer, MD   40 mg at 10/23/18 2134  . bisacodyl (DULCOLAX) suppository 10 mg  10 mg Rectal Daily PRN Poggi, Excell Seltzer, MD      . buPROPion [facility] SR) 12 hr tablet 150 mg  150 mg Oral BID Christena Flake, MD   150 mg at 10/23/18 2134  . ceFAZolin (ANCEF) IVPB 2g/100 mL premix  2 g Intravenous Q6H Poggi, Excell Seltzer, MD   Stopped at 10/24/18 0209  . diphenhydrAMINE (BENADRYL) 12.5 MG/5ML elixir 12.5-25 mg  12.5-25 mg Oral Q4H PRN Poggi, Excell Seltzer, MD      . docusate sodium (COLACE) capsule 100 mg  100 mg Oral BID Christena Flake, MD   100 mg  at 10/23/18 2134  . enoxaparin (LOVENOX) injection 40 mg  40 mg Subcutaneous Q24H Poggi, Excell Seltzer, MD      . ezetimibe (ZETIA) tablet 10 mg  10 mg Oral Daily Poggi, Excell Seltzer, MD      . HYDROcodone-acetaminophen (NORCO) 7.5-325 MG per tablet 1-2 tablet  1-2 tablet Oral Q4H PRN Poggi, Excell Seltzer, MD   1 tablet at 10/23/18 1944  . Influenza vac split quadrivalent PF (FLUARIX) injection 0.5 mL  0.5 mL Intramuscular Tomorrow-1000 Poggi, Excell Seltzer, MD      . ketorolac (TORADOL) 15 MG/ML injection 15 mg  15 mg Intravenous Q6H Poggi, Excell Seltzer, MD   15 mg at 10/24/18 9147  . loratadine (CLARITIN) tablet 10 mg  10 mg Oral Daily Poggi, Excell Seltzer, MD      . magnesium hydroxide (MILK OF MAGNESIA) suspension 30 mL  30 mL Oral Daily PRN Poggi, Excell Seltzer, MD      . metFORMIN (GLUCOPHAGE) tablet 850 mg  850 mg Oral Q breakfast Poggi, Excell Seltzer, MD      . metoCLOPramide (REGLAN) tablet 5-10 mg  5-10 mg Oral Q8H PRN Poggi, Excell Seltzer, MD       Or  . metoCLOPramide (REGLAN) injection 5-10 mg  5-10 mg Intravenous Q8H PRN Poggi, Excell Seltzer, MD      . midodrine (PROAMATINE) tablet 20 mg  20 mg Oral Daily Poggi, Excell Seltzer, MD      . mometasone-formoterol [facility]) 200-5 MCG/ACT inhaler 2 puff  2 puff Inhalation BID Poggi, Excell Seltzer, MD   2 puff at 10/23/18 2134  . morphine 2 MG/ML injection 0.5-1 mg  0.5-1 mg Intravenous Q2H PRN Poggi, Excell Seltzer, MD      . nitroGLYCERIN (NITROSTAT) SL tablet 0.4 mg  0.4 mg Sublingual Q5 min PRN Poggi, Excell Seltzer, MD      . ondansetron (ZOFRAN) tablet 4 mg  4 mg Oral Q6H PRN Poggi, Excell Seltzer, MD       Or  . ondansetron (ZOFRAN) injection 4 mg  4 mg Intravenous Q6H PRN Poggi, Excell Seltzer, MD      . pantoprazole (PROTONIX) EC tablet 40 mg  40 mg Oral Daily Poggi, Excell Seltzer, MD      . sodium phosphate (FLEET) 7-19 GM/118ML enema 1 enema  1 enema Rectal Once PRN Poggi, Excell Seltzer, MD      . tamsulosin (FLOMAX) capsule 0.4 mg  0.4 mg Oral QHS Poggi, Excell Seltzer, MD   0.4 mg at 10/23/18 2134  . traMADol (ULTRAM) tablet 50 mg  50 mg Oral Q6H Poggi, Excell Seltzer, MD   50 mg at 10/24/18 6295  . venlafaxine XR (EFFEXOR-XR) 24 hr capsule 225 mg  225 mg Oral Daily Poggi, Excell Seltzer, MD         Discharge Medications: Please see discharge summary for a list of discharge medications.  Relevant Imaging Results:  Relevant Lab Results:   Additional Information (SSN: [id number])  Roylene Heaton, Darleen Crocker, LCSW

## 2018-10-24 NOTE — Care Management Note (Addendum)
Case Management Note  Patient Details  Name: [name] MRN: [id number] Date of Birth: Mar 29, 1956  Subjective/Objective:                   RNCM met with patient to discuss discharge planning. He hopes to go to outpatient PT at [place] [facility] at discharge. He is scheduled with them on 11/06/18 at 1600 however this may be too long to wait. I have left message at Dr. Nicholaus Bloom office to help with sooner appointment; message also sent to surgeon.  He plans to return to home with his wife. He has a front-wheeled walker. Per [place] PA, patient will need Lovenox 36m injection daily for 14 day no-refills at discharge. [facility] uses [facility] [id number] [facility]. Action/Plan: [facility]ox [facility] to [facility].  Surgeon replied to outpatient PT appointment- he will have his office arrange for patient.   Expected Discharge Date:                  Expected Discharge Plan:     In-House Referral:     Discharge planning Services  CM Consult  Post Acute Care Choice:    Choice offered to:  Patient  DME Arranged:    DME Agency:     HH [place]     Status of Service:  In process, will continue to follow  If discussed at Long Length of Stay Meetings, dates discussed:    Additional Comments: Lovenox cost $85.73.  [facility]   AMarshell Garfinkel RN 10/24/2018, 1:06 PM

## 2018-10-24 NOTE — Evaluation (Signed)
Physical Therapy Evaluation Patient Details Name: [name] MRN: [id number] DOB: 11/23/1955 Today's Date: 10/24/2018   History of Present Illness  62 y/o male who had partial R knee replacement last year, now here with revision to total knee replacement 12/2.  Clinical Impression  Pt was able to participate well with PT exam and showed good overall tolerance.  Because of issues with the partial he is a little hesitant to really push himself, but ultimately he was able to participate well with ~10 minutes of exercises apart from the exam, ambulated confidently with RW ~75 ft, showed good post-op strength and ROM and overall did well.      Follow Up Recommendations Home health PT    Equipment Recommendations  None recommended by PT    Recommendations for Other Services       Precautions / Restrictions Precautions Precautions: Knee;Fall Restrictions RLE Weight Bearing: Weight bearing as tolerated      Mobility  Bed Mobility Overal bed mobility: Modified Independent             General bed mobility comments: Pt able to get himself to sitting EOB w/o assist  Transfers Overall transfer level: Modified independent Equipment used: Rolling walker (2 wheeled)             General transfer comment: Needed light cuing and assist to insure appropriate set up/sequencing, but able to rise and return to sitting w/o phyiscal assist  Ambulation/Gait Ambulation/Gait assistance: Modified independent (Device/Increase time) Gait Distance (Feet): 75 Feet Assistive device: Rolling walker (2 wheeled)       General Gait Details: Pt with good confidence with WBing on the R and overall did well.  He showed good safety and effort and overall did well.  After brief warm up period he was able to maintain consistent walker motion without favoring R LE excessively.   Stairs            Wheelchair Mobility    Modified Rankin (Stroke Patients Only)       Balance Overall  balance assessment: Modified Independent                                           Pertinent Vitals/Pain Pain Assessment: 0-10 Pain Score: 5     Home Living Family/patient expects to be discharged to:: Private residence Living Arrangements: Spouse/significant other Available Help at Discharge: Family Type of Home: House Home Access: Stairs to enter   Secretary/administratorntrance Stairs-Number of Steps: 3   Home Equipment: Environmental consultantWalker - 2 wheels      Prior Function Level of Independence: Independent         Comments: Pt able to manage, but has had issues with R knee recently     Hand Dominance        Extremity/Trunk Assessment   Upper Extremity Assessment Upper Extremity Assessment: Overall WFL for tasks assessed    Lower Extremity Assessment Lower Extremity Assessment: Overall WFL for tasks assessed(Pt with expected R LE post-op weakness, but [facility])       Communication   Communication: No difficulties  Cognition Arousal/Alertness: Awake/alert Behavior During Therapy: WFL for tasks assessed/performed Overall Cognitive Status: Within Functional Limits for tasks assessed  General Comments      Exercises Total Joint Exercises Ankle Circles/Pumps: AROM;10 reps Quad Sets: Strengthening;10 reps Short Arc Quad: Strengthening;10 reps Heel Slides: Strengthening;10 reps Hip ABduction/ADduction: Strengthening;10 reps Straight Leg Raises: AROM;10 reps Knee Flexion: PROM;5 reps Goniometric ROM: 0-95   Assessment/Plan    PT Assessment Patient needs continued PT services  PT Problem List Decreased strength;Decreased range of motion;Decreased activity tolerance;Decreased balance;Decreased mobility;Decreased knowledge of use of DME;Decreased safety awareness;Pain;Decreased coordination       PT Treatment Interventions DME instruction;Gait training;Stair training;Functional mobility training;Therapeutic  activities;Neuromuscular re-education;Therapeutic exercise;Balance training;Patient/family education    PT Goals (Current goals can be found in the Care Plan section)  Acute Rehab PT Goals Patient Stated Goal: Go home and get back to normal activity PT Goal Formulation: With patient Time For Goal Achievement: 11/07/18 Potential to Achieve Goals: Good    Frequency BID   Barriers to discharge        Co-evaluation               AM-PAC PT "6 Clicks" Mobility  Outcome Measure Help needed turning from your back to your side while in a flat bed without using bedrails?: None Help needed moving from lying on your back to sitting on the side of a flat bed without using bedrails?: None Help needed moving to and from a bed to a chair (including a wheelchair)?: None Help needed standing up from a chair using your arms (e.g., wheelchair or bedside chair)?: A Little Help needed to walk in hospital room?: A Little Help needed climbing 3-5 steps with a railing? : A Little 6 Click Score: 21    End of Session Equipment Utilized During Treatment: Gait belt Activity Tolerance: Patient tolerated treatment well Patient left: with chair alarm set;with call bell/phone within reach;with family/visitor present Nurse Communication: Mobility status PT Visit Diagnosis: Muscle weakness (generalized) (M62.81);Difficulty in walking, not elsewhere classified (R26.2)    Time: 1610-9604 PT Time Calculation (min) (ACUTE ONLY): 29 min   Charges:   PT Evaluation $PT Eval Low Complexity: 1 Low PT Treatments $Therapeutic Exercise: 8-22 mins        Malachi Pro, DPT 10/24/2018, 10:23 AM

## 2018-10-25 LAB — GLUCOSE, CAPILLARY
Glucose-Capillary: 80 mg/dL (ref 70–99)
Glucose-Capillary: 58 mg/dL — ABNORMAL LOW (ref 70–99)
Glucose-Capillary: 87 mg/dL (ref 70–99)
Glucose-Capillary: 92 mg/dL (ref 70–99)

## 2018-10-25 LAB — BASIC METABOLIC PANEL
Anion gap: 4 — ABNORMAL LOW (ref 5–15)
BUN: 13 mg/dL (ref 8–23)
CALCIUM: 8 mg/dL — AB (ref 8.9–10.3)
CO2: 27 mmol/L (ref 22–32)
Chloride: 105 mmol/L (ref 98–111)
Creatinine, Ser: 0.95 mg/dL (ref 0.61–1.24)
GFR calc Af Amer: >60 mL/min (ref >60)
Glucose, Bld: 90 mg/dL (ref 70–99)
Potassium: 4.3 mmol/L (ref 3.5–5.1)
Sodium: 136 mmol/L (ref 135–145)

## 2018-10-25 LAB — CBC
HCT: 36.4 % — ABNORMAL LOW (ref 39.0–52.0)
Hemoglobin: 11.8 g/dL — ABNORMAL LOW (ref 13.0–17.0)
MCH: 27.5 pg (ref 26.0–34.0)
MCHC: 32.4 g/dL (ref 30.0–36.0)
MCV: 84.8 fL (ref 80.0–100.0)
PLATELETS: 132 10*3/uL — AB (ref 150–400)
RBC: 4.29 MIL/uL (ref 4.22–5.81)
RDW: 13.9 % (ref 11.5–15.5)
WBC: 6.8 10*3/uL (ref 4.0–10.5)
nRBC: 0 % (ref 0.0–0.2)

## 2018-10-25 MED ORDER — ENOXAPARIN SODIUM 40 MG/0.4ML ~~LOC~~ SOLN: 40.0000 mg | SUBCUTANEOUS | 0 refills | Status: DC

## 2018-10-25 MED ORDER — HYDROCODONE-ACETAMINOPHEN 7.5-325 MG PO TABS: 1.0000 | ORAL_TABLET | ORAL | 0 refills | Status: DC | PRN

## 2018-10-25 MED ORDER — TRAMADOL HCL 50 MG PO TABS: 50.0000 mg | ORAL_TABLET | Freq: Four times a day (QID) | ORAL | 0 refills | Status: DC

## 2018-10-25 NOTE — Care Management (Signed)
Patient discharging to home today with outpatient PT appointment to start tomorrow morning.  No other RNCM needs.

## 2018-10-25 NOTE — Progress Notes (Signed)
Physical Therapy Treatment Patient Details Name: [name] MRN: [id number] DOB: [birth year] Today's Date: 10/25/2018    History of Present Illness 62 y/o male who had partial R knee replacement last year, now here with revision to total knee replacement 12/2.    PT Comments    Pt presents with minor deficits in strength, transfers, gait, R knee ROM, and activity tolerance but continues to progress very well towards goals.  Pt tolerated the below therex well and was motivated to participate throughout the session.  Pt was steady during amb with a RW with very good cadence and even bilateral step length.  Stair negotiation education provided with demonstration of crutches and a RW backwards.  After practicing ascending and descending steps pt reported preferring the crutches which he owns at home.  Pt was steady ascending and descending steps with the crutches with good eccentric and concentric control and only min verbal cues for sequencing.  Pt will benefit from HHPT services upon discharge to safely address above deficits for decreased caregiver assistance and eventual return to PLOF.     Follow Up Recommendations  Home health PT     Equipment Recommendations  None recommended by PT    Recommendations for Other Services       Precautions / Restrictions Precautions Precautions: Knee;Fall Restrictions Weight Bearing Restrictions: Yes RLE Weight Bearing: Weight bearing as tolerated    Mobility  Bed Mobility               General bed mobility comments: NT, pt in recliner  Transfers Overall transfer level: Needs assistance Equipment used: Rolling walker (2 wheeled) Transfers: Sit to/from Stand Sit to Stand: Supervision         General transfer comment: Min verbal cues for proper sequencing to prevent painful R knee flex during stand to sit with pt demonstrating good control and stability  Ambulation/Gait Ambulation/Gait assistance: Modified independent  (Device/Increase time) Gait Distance (Feet): 200 Feet Assistive device: Rolling walker (2 wheeled) Gait Pattern/deviations: Step-through pattern     General Gait Details: Consistent and confident cadence with minimal UE use on the walker and no hesitation with WBing and with even step length left/right.   Stairs Stairs: Yes Stairs assistance: Min guard Stair Management: With crutches Number of Stairs: 4 General stair comments: Demonstration education and then Min verbal cues with sequencing up and down 4 steps with crutches with pt steady with good eccentric and concentric control.     Wheelchair Mobility    Modified Rankin (Stroke Patients Only)       Balance Overall balance assessment: No apparent balance deficits (not formally assessed)                                          Cognition Arousal/Alertness: Awake/alert Behavior During Therapy: WFL for tasks assessed/performed Overall Cognitive Status: Within Functional Limits for tasks assessed                                        Exercises Total Joint Exercises Ankle Circles/Pumps: AROM;10 reps;Both Quad Sets: Strengthening;10 reps;Right;5 reps Hip ABduction/ADduction: Strengthening;10 reps Straight Leg Raises: Strengthening;10 reps Long Arc Quad: AROM;Right;10 reps;15 reps Knee Flexion: AROM;Right;10 reps;15 reps Goniometric ROM: R knee AROM 6-90 deg Other Exercises Other Exercises: Positioning education to encourage R knee ext  ROM Other Exercises: HEP education and review per handout    General Comments        Pertinent Vitals/Pain Pain Assessment: 0-10 Pain Score: 6  Pain Location: R knee Pain Descriptors / Indicators: Aching;Sore Pain Intervention(s): Monitored during session;Premedicated before session    Home Living                      Prior Function            PT Goals (current goals can now be found in the care plan section) Progress towards PT  goals: Progressing toward goals    Frequency    BID      PT Plan Current plan remains appropriate    Co-evaluation              AM-PAC PT "6 Clicks" Mobility   Outcome Measure                   End of Session Equipment Utilized During Treatment: Gait belt Activity Tolerance: Patient tolerated treatment well Patient left: in chair;with call bell/phone within reach;with chair alarm set;with SCD's reapplied;Other (comment)(Polar care to R knee) Nurse Communication: Mobility status PT Visit Diagnosis: Muscle weakness (generalized) (M62.81);Difficulty in walking, not elsewhere classified (R26.2)     Time: [id number] PT Time Calculation (min) (ACUTE ONLY): 41 min  Charges:  $Gait Training: 23-37 mins $Therapeutic Exercise: 8-22 mins                     D. Scott Warrene Kapfer PT, DPT 10/25/18, 12:32 PM

## 2018-10-25 NOTE — Discharge Summary (Addendum)
Physician Discharge Summary  Patient ID: KEENON LEITZEL MRN: [id number] DOB/AGE: April 27, 1956 62 y.o.  Admit date: 10/23/2018 Discharge date: 10/25/2018  Admission Diagnoses:  status post right partial knee replacement.loosening of knee joint prosthesis  Discharge Diagnoses: Patient Active Problem List   Diagnosis Date Noted  . Status post total knee replacement using cement, right 10/23/2018  . Status post unicompartmental knee replacement, right 07/19/2017  . Viral URI with cough 11/10/2015  . Falls 08/21/2015  . Coronary artery disease 08/21/2015  . Sleep apnea 03/26/2015  . Chest discomfort 01/28/2015  . Shortness of breath 01/28/2015  . S/P CABG x 2 01/28/2015  . Hyperlipidemia 01/28/2015  . Type 2 diabetes mellitus with other circulatory complications (HCC) 01/28/2015  . Morbid obesity (HCC) 01/28/2015  . Orthostatic hypotension 01/28/2015    Past Medical History:  Diagnosis Date  . Anxiety   . Atherosclerosis of coronary artery bypass graft with angina pectoris (HCC)   . Barrett's esophagus   . Benign enlargement of prostate   . Chronic constipation   . Colon polyp   . Coronary artery disease   . Depression   . GERD (gastroesophageal reflux disease)   . H/O esophageal spasm   . Hyperlipidemia   . Hypertension   . MI (myocardial infarction) (HCC) 2011 & 2014   x 2  . Sleep apnea     after weight loss no need for CPAP  . Type 2 diabetes mellitus (HCC)    Transfusion: None.   Consultants (if any):   Discharged Condition: Improved  Hospital Course: YAAKOV SAINDON is an 62 y.o. male who was admitted 10/23/2018 with a diagnosis of aseptic loosening of tibial component status post prior UKA of the right knee and went to the operating room on 10/23/2018 and underwent the above named procedures.    Surgeries: Procedure(s): CONVERSION OF PARTIAL TO TOTAL KNEE ARTHROPLASTY on 10/23/2018 Patient tolerated the surgery well. Taken to PACU where she was stabilized and  then transferred to the orthopedic floor.  Started on Lovenox 40mg  q 24 hrs and his home Plavix order was held. Foot pumps applied bilaterally at 80 mm. Heels elevated on bed with rolled towels. No evidence of DVT. Negative Homan. Physical therapy started on day #1 for gait training and transfer. OT started day #1 for ADL and assisted devices.  Patient's IV was removed on POD2.  Implants: Revision right TKA using all-cemented [employer] Vanguard system with a 70 mm PCR femur, a 75 mm tibial tray with an 18 mm anterior stabilized E-poly insert, and a 34 x 8.5 mm all-poly 3-pegged domed patella.  He was given perioperative antibiotics:  Anti-infectives (From admission, onward)   Start     Dose/Rate Route Frequency Ordered Stop   10/23/18 2000  ceFAZolin (ANCEF) IVPB 2g/100 mL premix     2 g 200 mL/hr over 30 Minutes Intravenous Every 6 hours 10/23/18 1814 10/24/18 1227   10/23/18 1000  ceFAZolin (ANCEF) IVPB 2g/100 mL premix     2 g 200 mL/hr over 30 Minutes Intravenous  Once 10/23/18 0947 10/23/18 1404   10/23/18 0938  ceFAZolin (ANCEF) 2-4 GM/100ML-% IVPB    Note to Pharmacy:  Fulton Mole: cabinet override      10/23/18 0938 10/23/18 1354    .  He was given sequential compression devices, early ambulation, and Lovenox for DVT prophylaxis.  He benefited maximally from the hospital stay and there were no complications.    Recent vital signs:  Vitals:   10/24/18  2336 10/25/18 0810  BP: 136/73 134/80  Pulse: 95 86  Resp: 15 18  Temp: 98.4 F (36.9 C) 98.1 F (36.7 C)  SpO2: 97% 99%    Recent laboratory studies:  Lab Results  Component Value Date   HGB 11.8 (L) 10/25/2018   HGB 12.2 (L) 10/24/2018   HGB 13.5 10/12/2018   Lab Results  Component Value Date   WBC 6.8 10/25/2018   PLT 132 (L) 10/25/2018   Lab Results  Component Value Date   INR 1.14 07/13/2017   Lab Results  Component Value Date   NA 136 10/25/2018   K 4.3 10/25/2018   CL 105 10/25/2018   CO2  27 10/25/2018   BUN 13 10/25/2018   CREATININE 0.95 10/25/2018   GLUCOSE 90 10/25/2018    Discharge Medications:   Allergies as of 10/25/2018      Reactions   Oxycodone Other (See Comments)   Hallucinations      Medication List    STOP taking these medications   aspirin 81 MG tablet   clopidogrel 75 MG tablet Commonly known as:  PLAVIX   HYDROcodone-acetaminophen 5-325 MG tablet Commonly known as:  NORCO/VICODIN Replaced by:  HYDROcodone-acetaminophen 7.5-325 MG tablet     TAKE these medications   acetaminophen 325 MG tablet Commonly known as:  TYLENOL Take 650 mg by mouth every 6 (six) hours as needed (for pain/headaches.).   albuterol 108 (90 Base) MCG/ACT inhaler Commonly known as:  PROVENTIL HFA;VENTOLIN HFA Inhale into the lungs every 6 (six) hours as needed for wheezing or shortness of breath.   ALPRAZolam 0.5 MG tablet Commonly known as:  XANAX Take 0.25 mg by mouth at bedtime.   atorvastatin 40 MG tablet Commonly known as:  LIPITOR Take 40 mg by mouth at bedtime.   buPROPion 150 MG 12 hr tablet Commonly known as:  WELLBUTRIN SR Take 150 mg by mouth 2 (two) times daily.   cetirizine 10 MG tablet Commonly known as:  ZYRTEC Take 10 mg by mouth at bedtime.   diphenhydramine-acetaminophen 25-500 MG Tabs tablet Commonly known as:  TYLENOL PM Take 1-2 tablets by mouth at bedtime as needed (for sleep.).   enoxaparin 40 MG/0.4ML injection Commonly known as:  LOVENOX Inject 0.4 mLs (40 mg total) into the skin daily.   ezetimibe 10 MG tablet Commonly known as:  ZETIA Take 1 tablet (10 mg total) by mouth daily.   fluticasone-salmeterol 115-21 MCG/ACT inhaler Commonly known as:  ADVAIR HFA Inhale 2 puffs into the lungs 2 (two) times daily.   HYDROcodone-acetaminophen 7.5-325 MG tablet Commonly known as:  NORCO Take 1-2 tablets by mouth every 4 (four) hours as needed for severe pain (pain score 7-10). Replaces:  HYDROcodone-acetaminophen 5-325 MG  tablet   ibuprofen 200 MG tablet Commonly known as:  ADVIL,MOTRIN Take 400 mg by mouth every 6 (six) hours as needed (for pain.).   metFORMIN 850 MG tablet Commonly known as:  GLUCOPHAGE Take 850 mg by mouth daily with breakfast.   midodrine 10 MG tablet Commonly known as:  PROAMATINE TAKE 1 TABLET (10 MG TOTAL) BY MOUTH 4 (FOUR) TIMES DAILY - AFTER MEALS AND AT BEDTIME. What changed:    how much to take  when to take this   nitroGLYCERIN 0.4 MG SL tablet Commonly known as:  NITROSTAT Place 1 tablet (0.4 mg total) under the tongue every 5 (five) minutes as needed for chest pain.   RABEprazole 20 MG tablet Commonly known as:  ACIPHEX Take 20  mg by mouth at bedtime.   tamsulosin 0.4 MG Caps capsule Commonly known as:  FLOMAX Take 0.4 mg by mouth at bedtime.   traMADol 50 MG tablet Commonly known as:  ULTRAM Take 1 tablet (50 mg total) by mouth every 6 (six) hours.   venlafaxine XR 75 MG 24 hr capsule Commonly known as:  EFFEXOR-XR Take 225 mg by mouth daily.       Diagnostic Studies: Dg Chest 2 View  Result Date: 10/13/2018 CLINICAL DATA:  Preoperative study prior to knee surgery. EXAM: CHEST - 2 VIEW COMPARISON:  July 13, 2017 FINDINGS: The heart size and mediastinal contours are within normal limits. Both lungs are clear. The visualized skeletal structures are unremarkable. IMPRESSION: No active cardiopulmonary disease. Electronically Signed   By: Gerome Sam III M.D   On: 10/13/2018 02:27   Dg Knee Right Port  Result Date: 10/23/2018 CLINICAL DATA:  Status post RIGHT total knee replacement EXAM: PORTABLE RIGHT KNEE - 1-2 VIEW COMPARISON:  None. FINDINGS: RIGHT knee arthroplasty hardware appears appropriately positioned. Osseous alignment is anatomic. Expected postsurgical changes within the surrounding soft tissues. IMPRESSION: RIGHT knee arthroplasty hardware appears appropriately positioned. No evidence of surgical complicating feature. Electronically Signed    By: Bary Richard M.D.   On: 10/23/2018 17:01   Disposition: Plan will be for discharge home later today.  Follow-up Information    Anson Oregon, PA-C Follow up in 14 day(s).   Specialty:  Physician Assistant Why:  Mindi Slicker information: [address] [place] [address] [phone]          Signed: Meriel Pica PA-C 10/25/2018, 8:13 AM

## 2018-10-25 NOTE — Discharge Instructions (Signed)

## 2018-10-25 NOTE — Progress Notes (Signed)
  Subjective: 2 Days Post-Op Procedure(s) (LRB): CONVERSION OF PARTIAL TO TOTAL KNEE ARTHROPLASTY (Right) Patient reports pain as mild.   Patient is well, and has had no acute complaints or problems Plan will be for discharge home with outpatient PT. Negative for chest pain and shortness of breath Fever: no Gastrointestinal:Negative for nausea and vomiting  Objective: Vital signs in last 24 hours: Temp:  [97.7 F (36.5 C)-98.4 F (36.9 C)] 98.4 F (36.9 C) (12/03 2336) Pulse Rate:  [75-95] 95 (12/03 2336) Resp:  [15-18] 15 (12/03 2336) BP: (136-144)/(73-86) 136/73 (12/03 2336) SpO2:  [97 %-99 %] 97 % (12/03 2336)  Intake/Output from previous day:  Intake/Output Summary (Last 24 hours) at 10/25/2018 0810 Last data filed at 10/25/2018 0700 Gross per 24 hour  Intake 1271.82 ml  Output 775 ml  Net 496.82 ml    Intake/Output this shift: No intake/output data recorded.  Labs: Recent Labs    10/24/18 0329 10/25/18 0417  HGB 12.2* 11.8*   Recent Labs    10/24/18 0329 10/25/18 0417  WBC 8.1 6.8  RBC 4.53 4.29  HCT 37.8* 36.4*  PLT 125* 132*   Recent Labs    10/24/18 0329 10/25/18 0417  NA 137 136  K 4.3 4.3  CL 106 105  CO2 24 27  BUN 12 13  CREATININE 1.01 0.95  GLUCOSE 125* 90  CALCIUM 7.9* 8.0*   No results for input(s): LABPT, INR in the last 72 hours.   EXAM General - Patient is Alert, Appropriate and Oriented Extremity - ABD soft Neurovascular intact Sensation intact distally Intact pulses distally Dorsiflexion/Plantar flexion intact Incision: scant drainage No cellulitis present Dressing/Incision - blood tinged drainage Motor Function - intact, moving foot and toes well on exam.  Abdomen soft with normal BS this AM.  Past Medical History:  Diagnosis Date  . Anxiety   . Atherosclerosis of coronary artery bypass graft with angina pectoris (HCC)   . Barrett's esophagus   . Benign enlargement of prostate   . Chronic constipation   . Colon  polyp   . Coronary artery disease   . Depression   . GERD (gastroesophageal reflux disease)   . H/O esophageal spasm   . Hyperlipidemia   . Hypertension   . MI (myocardial infarction) (HCC) 2011 & 2014   x 2  . Sleep apnea     after weight loss no need for CPAP  . Type 2 diabetes mellitus (HCC)     Assessment/Plan: 2 Days Post-Op Procedure(s) (LRB): CONVERSION OF PARTIAL TO TOTAL KNEE ARTHROPLASTY (Right) Active Problems:   Status post total knee replacement using cement, right  Estimated body mass index is 33.76 kg/m as calculated from the following:   Height as of this encounter: 5\' 8"  (1.727 m).   Weight as of this encounter: 100.7 kg. Advance diet Up with therapy D/C IV fluids when tolerating po intake.  Labs reviewed this AM. Walked 250 feet yesterday with PT. Currently passing gas, will work on BM. Plan on d/c home today. Will discharge on Lovenox for DVT prevention.  DVT Prophylaxis - Lovenox, Foot Pumps and TED hose Weight-Bearing as tolerated to right leg  J. Horris LatinoLance Livy Ross, PA-C [facility] 10/25/2018, 8:10 AM

## 2018-10-25 NOTE — Progress Notes (Signed)
Pt ready for discharge home today per MD. Patient assessment unchanged from this morning. Pt met PT goals. Reviewed discharge instructions and prescriptions with pt and his wife; all questions answered and pt verbalized understanding. PIV removed, VSS. Pt assisted to car via NT.  Ethelda Chick

## 2018-10-25 NOTE — Progress Notes (Signed)
Hypoglycemic Event  CBG: 58  Treatment: 15 GM carbohydrate snack  Symptoms: None  Follow-up CBG: Time: 848 CBG Result: 87  Possible Reasons for Event: Other: Pt states he has lost over 100 pounds and may need to stop taking his metforming. Pt states he will discuss with his PCP at next appt. in January  Comments/MD notified: Micah NoelLance PA notified, held metformin    [place], [name]

## 2018-10-26 DIAGNOSIS — G8929 Other chronic pain: Secondary | ICD-10-CM | POA: Diagnosis not present

## 2018-10-26 DIAGNOSIS — M25661 Stiffness of right knee, not elsewhere classified: Secondary | ICD-10-CM | POA: Diagnosis not present

## 2018-10-26 DIAGNOSIS — M6281 Muscle weakness (generalized): Secondary | ICD-10-CM | POA: Diagnosis not present

## 2018-10-26 DIAGNOSIS — Z96651 Presence of right artificial knee joint: Secondary | ICD-10-CM | POA: Diagnosis not present

## 2018-10-26 DIAGNOSIS — M25561 Pain in right knee: Secondary | ICD-10-CM | POA: Diagnosis not present

## 2018-10-27 ENCOUNTER — Other Ambulatory Visit: Payer: Self-pay

## 2018-10-27 NOTE — Patient Outreach (Signed)
[facility] Customer service manager [facility]) Care Management  [facility] Pharmacy   10/27/2018  KEO SCHIRMER November 10, 1956 [id number]   Successful outreach to Mr. Hitsman.  HIPAA identifiers verified.  Reason for referral: 30 day post discharge medication review  Current insurance:HTA  PMHx:  Type 2 diabetes, orthostatic hypotension,coronary artery disease, s/p CABG x 2 and hyperlipidemia.  HPI:  Mr. Holte reports his is feeling well after his recent partial knee replacement.  He reports that he is ambulating, but trying to  "do too much."  He states that he checks his CBGs every morning with a range of 90-120mg /dL.  Recent HgA1c not in EMR.  Objective: Lab Results  Component Value Date   CREATININE 0.95 10/25/2018   CREATININE 1.01 10/24/2018   CREATININE 0.90 10/12/2018    Lab Results  Component Value Date   HGBA1C  11/18/2009    5.7 (NOTE) The ADA recommends the following therapeutic goal for glycemic control related to Hgb A1c measurement: Goal of therapy: <6.5 Hgb A1c  Reference: American Diabetes Association: Clinical Practice Recommendations 2010, Diabetes Care, 2010, 33: (Suppl  1).    Lipid Panel     Component Value Date/Time   CHOL  11/19/2009 0705    114        ATP III CLASSIFICATION:  <200     mg/dL   Desirable  409-811  mg/dL   Borderline High  >=914    mg/dL   High          TRIG 75 11/19/2009 0705   HDL 29 (L) 11/19/2009 0705   CHOLHDL 3.9 11/19/2009 0705   VLDL 15 11/19/2009 0705   LDLCALC  11/19/2009 0705    70        Total Cholesterol/HDL:CHD Risk Coronary Heart Disease Risk Table                     Men   Women  1/2 Average Risk   3.4   3.3  Average Risk       5.0   4.4  2 X Average Risk   9.6   7.1  3 X Average Risk  23.4   11.0        Use the calculated Patient Ratio above and the CHD Risk Table to determine the patient's CHD Risk.        ATP III CLASSIFICATION (LDL):  <100     mg/dL   Optimal  782-956  mg/dL   Near or Above                     Optimal  130-159  mg/dL   Borderline  213-086  mg/dL   High  >578     mg/dL   Very High    BP Readings from Last 3 Encounters:  10/25/18 (!) 144/90  10/21/18 (!) 151/90  10/16/18 (!) 144/88    Allergies  Allergen Reactions  . Oxycodone Other (See Comments)    Hallucinations    Medications Reviewed Today    Reviewed by Hurley Cisco, [facility] (Pharmacist) on 10/27/18 at 1355  Med List Status: <None>  Medication Order Taking? Sig Documenting Provider Last Dose Status Informant  acetaminophen (TYLENOL) 325 MG tablet [id number] Yes Take 650 mg by mouth every 6 (six) hours as needed (for pain/headaches.).  [provider] Taking Active Self  albuterol (PROVENTIL HFA;VENTOLIN HFA) 108 (90 BASE) MCG/ACT inhaler [id number] Yes Inhale into the lungs every 6 (six) hours as needed for wheezing or  shortness of breath. [provider] Taking Active Self  ALPRAZolam (XANAX) 0.5 MG tablet [id number] Yes Take 0.25 mg by mouth at bedtime. [provider] Taking Active Self  atorvastatin (LIPITOR) 40 MG tablet [id number] Yes Take 40 mg by mouth at bedtime.  [provider] Taking Active Self  buPROPion (WELLBUTRIN SR) 150 MG 12 hr tablet [id number] Yes Take 150 mg by mouth 2 (two) times daily. [provider] Taking Active Self  cetirizine (ZYRTEC) 10 MG tablet [id number] Yes Take 10 mg by mouth at bedtime. [provider] Taking Active   diphenhydramine-acetaminophen (TYLENOL PM) 25-500 MG TABS tablet [id number] Yes Take 1-2 tablets by mouth at bedtime as needed (for sleep.). [provider] Taking Active Self  enoxaparin (LOVENOX) 40 MG/0.4ML injection [id number] Yes Inject 0.4 mLs (40 mg total) into the skin daily. Anson Oregon, PA-C Taking Active   ezetimibe (ZETIA) 10 MG tablet [id number] Yes Take 1 tablet (10 mg total) by mouth daily. Antonieta Iba, MD Taking Active Self           Med Note Haywood Pao   Fri Oct 06, 2018 10:33  AM)    fluticasone-salmeterol (ADVAIR [facility]) 387-56 MCG/ACT inhaler [id number] Yes Inhale 2 puffs into the lungs 2 (two) times daily. Antonieta Iba, MD Taking Active Self  HYDROcodone-acetaminophen [facility]) 7.5-325 MG tablet [id number] Yes Take 1-2 tablets by mouth every 4 (four) hours as needed for severe pain (pain score 7-10). Anson Oregon, PA-C Taking Active   ibuprofen (ADVIL,MOTRIN) 200 MG tablet [id number] Yes Take 400 mg by mouth every 6 (six) hours as needed (for pain.). [provider] Taking Active Self  metFORMIN (GLUCOPHAGE) 850 MG tablet [id number] Yes Take 850 mg by mouth daily with breakfast.  [provider] Taking Active Self  midodrine (PROAMATINE) 10 MG tablet [id number] Yes TAKE 1 TABLET (10 MG TOTAL) BY MOUTH 4 (FOUR) TIMES DAILY - AFTER MEALS AND AT BEDTIME.  Patient taking differently:  Take 20 mg by mouth daily.    Antonieta Iba, MD Taking Active Self  nitroGLYCERIN (NITROSTAT) 0.4 MG SL tablet [id number] Yes Place 1 tablet (0.4 mg total) under the tongue every 5 (five) minutes as needed for chest pain. Antonieta Iba, MD  Active   RABEprazole (ACIPHEX) 20 MG tablet [id number] Yes Take 20 mg by mouth at bedtime.  [provider] Taking Active Self  tamsulosin (FLOMAX) 0.4 MG CAPS capsule [id number] Yes Take 0.4 mg by mouth at bedtime.  [provider] Taking Active Self  traMADol (ULTRAM) 50 MG tablet [id number] Yes Take 1 tablet (50 mg total) by mouth every 6 (six) hours. Anson Oregon, PA-C Taking Active   venlafaxine XR (EFFEXOR-XR) 75 MG 24 hr capsule [id number] Yes Take 225 mg by mouth daily.  [provider] Taking Active Self          ASSESSMENT: Date Discharged from Hospital: 10/25/18 Date Medication Reconciliation Performed: 10/27/2018   Medications Discontinued at Discharge:   Aspirin  Clopidogrel  Hydrocodone/APAP 5/325  New Medications at Discharge:   Hydrocodone/APAP  7.5/325  Enoxaparin  tramadol  Patient was recently discharged from hospital and all medications have been reviewed.  Drugs sorted by system:  Neurologic/Psychologic:alprazolam, bupropion, venlafaxine  Cardiovascular:atorvastatin, ezetimibe, midodrine,nitroglycerin  Pulmonary/Allergy:albuterol MDI, cetirizine, fluticasone/salmetol  Gastrointestinal: rabeprazole,   Endocrine: metformin  Pain: acetaminophen, hydrocodone/APAP, ibuprofen, tramadol  Genitourinary: tamsulosin  Vitamins/Minerals/Supplements:  Miscellaneous: diphenhydramine/APAP, enoxapairn  Medication Review Findings:  . Serotonergic effects of tramadol and venlafaxine may  additive and increase the risk of serotonin syndrome.   PLAN: -Instructed patient to take new medications as prescribed and discontinue old medications as prescribed   Route note to PCP, Dr. Terance HartBronstein.  Berlin HunJennifer Mariann Palo, PharmD Clinical Pharmacist [facility] [phone]

## 2018-10-29 LAB — AEROBIC/ANAEROBIC CULTURE (SURGICAL/DEEP WOUND)

## 2018-10-29 LAB — AEROBIC/ANAEROBIC CULTURE W GRAM STAIN (SURGICAL/DEEP WOUND): Culture: NO GROWTH

## 2018-10-30 DIAGNOSIS — M25661 Stiffness of right knee, not elsewhere classified: Secondary | ICD-10-CM | POA: Diagnosis not present

## 2018-10-30 DIAGNOSIS — Z96651 Presence of right artificial knee joint: Secondary | ICD-10-CM | POA: Diagnosis not present

## 2018-10-30 DIAGNOSIS — M25561 Pain in right knee: Secondary | ICD-10-CM | POA: Diagnosis not present

## 2018-10-30 DIAGNOSIS — G8929 Other chronic pain: Secondary | ICD-10-CM | POA: Diagnosis not present

## 2018-10-30 DIAGNOSIS — M6281 Muscle weakness (generalized): Secondary | ICD-10-CM | POA: Diagnosis not present

## 2018-10-31 ENCOUNTER — Other Ambulatory Visit: Payer: Self-pay

## 2018-10-31 NOTE — Telephone Encounter (Signed)
This encounter was created in error - please disregard.

## 2018-10-31 NOTE — Patient Outreach (Signed)
[facility] Care Management  10/31/2018  [name] 08/10/1956 [id number]      EMMI-General discharge [name] ON EMMI ALERT Day #4 Date: 10/31/18 [name] Alert Reason: "Lost interest in things? Yes, Sad/hopeless/anxious/empty? Yes"   Outreach attempt # 1. Spoke with patient. Reviewed and addressed [name] alert. Patient reports he was having a "bad" day yesterday during his PT. He states, "I feel like I am letting everybody down". Reports this is due to thinking he is not progressing as fast as he would like. Patient notified the PT yesterday during visit and she made adjustments to his exercise plan. He reports having some slight shortness of breath with exertion yesterday during therapy for which he used his inhalers with improvement, he denies having chest pain. He will return to PT tomorrow and states he has a better frame of mind today. Patient states he has come to realize this recovery may take longer but he is willing to participate in PT and follow his doctor's recommendations. RN CM provided active listening and validated his feelings of frustration. RN CM counseled patient on the importance of keeping all PT appointments and to follow his HEP as directed and as tolerated. RN CM encouraged patient to notify his doctor if he feels he is not making progress with his therapy and or if he has increased feelings of sadness and or depression. RN CM instructed patient to report any symptoms of distress during his PT visit, such as chest pain, nausea, worsening shortness of breath not relieved with use of his inhalers. Patient verbalizes understanding and states he must end the call to answer another line. Patient is agreeable to a call back in 1-2 weeks.      Plan: RN CM will follow up with patient in about a week to reassess for CM needs.    Delsa SaleAngel Sabir Charters, RN,CCM [facility] Care Management Care Management Coordinator Direct Phone: [phone] Toll Free: [phone] Fax:  [phone]

## 2018-11-01 ENCOUNTER — Other Ambulatory Visit: Payer: Self-pay

## 2018-11-01 ENCOUNTER — Emergency Department: Payer: PPO

## 2018-11-01 ENCOUNTER — Emergency Department
Admission: EM | Admit: 2018-11-01 | Discharge: 2018-11-01 | Disposition: A | Discharge status: Home / Self Care | Payer: PPO | Attending: Emergency Medicine | Admitting: Emergency Medicine

## 2018-11-01 DIAGNOSIS — R443 Hallucinations, unspecified: Secondary | ICD-10-CM | POA: Diagnosis not present

## 2018-11-01 DIAGNOSIS — I10 Essential (primary) hypertension: Secondary | ICD-10-CM | POA: Insufficient documentation

## 2018-11-01 DIAGNOSIS — Z79899 Other long term (current) drug therapy: Secondary | ICD-10-CM | POA: Insufficient documentation

## 2018-11-01 DIAGNOSIS — R0602 Shortness of breath: Secondary | ICD-10-CM | POA: Diagnosis present

## 2018-11-01 DIAGNOSIS — E119 Type 2 diabetes mellitus without complications: Secondary | ICD-10-CM | POA: Diagnosis not present

## 2018-11-01 DIAGNOSIS — R000 Tachycardia, unspecified: Secondary | ICD-10-CM | POA: Diagnosis not present

## 2018-11-01 DIAGNOSIS — R079 Chest pain, unspecified: Secondary | ICD-10-CM | POA: Diagnosis not present

## 2018-11-01 DIAGNOSIS — E860 Dehydration: Secondary | ICD-10-CM | POA: Diagnosis not present

## 2018-11-01 LAB — CBC WITH DIFFERENTIAL/PLATELET
Abs Immature Granulocytes: 0.15 10*3/uL — ABNORMAL HIGH (ref 0.00–0.07)
BASOS ABS: 0.1 10*3/uL (ref 0.0–0.1)
Basophils Relative: 1 %
Eosinophils Absolute: 0.2 10*3/uL (ref 0.0–0.5)
Eosinophils Relative: 2 %
HCT: 38.9 % — ABNORMAL LOW (ref 39.0–52.0)
Hemoglobin: 12.8 g/dL — ABNORMAL LOW (ref 13.0–17.0)
Immature Granulocytes: 2 %
Lymphocytes Relative: 9 %
Lymphs Abs: 0.9 10*3/uL (ref 0.7–4.0)
MCH: 26.9 pg (ref 26.0–34.0)
MCHC: 32.9 g/dL (ref 30.0–36.0)
MCV: 81.9 fL (ref 80.0–100.0)
Monocytes Absolute: 0.7 10*3/uL (ref 0.1–1.0)
Monocytes Relative: 7 %
Neutro Abs: 8.3 10*3/uL — ABNORMAL HIGH (ref 1.7–7.7)
Neutrophils Relative %: 79 %
PLATELETS: 222 10*3/uL (ref 150–400)
RBC: 4.75 MIL/uL (ref 4.22–5.81)
RDW: 14.4 % (ref 11.5–15.5)
WBC: 10.2 10*3/uL (ref 4.0–10.5)
nRBC: 0 % (ref 0.0–0.2)

## 2018-11-01 LAB — URINALYSIS, COMPLETE (UACMP) WITH MICROSCOPIC
BACTERIA UA: NONE SEEN
Bilirubin Urine: NEGATIVE
Glucose, UA: NEGATIVE mg/dL
HGB URINE DIPSTICK: NEGATIVE
Ketones, ur: NEGATIVE mg/dL
Leukocytes, UA: NEGATIVE
Nitrite: NEGATIVE
Protein, ur: NEGATIVE mg/dL
SPECIFIC GRAVITY, URINE: 1.011 (ref 1.005–1.030)
Squamous Epithelial / HPF: NONE SEEN (ref 0–5)
pH: 6 (ref 5.0–8.0)

## 2018-11-01 LAB — COMPREHENSIVE METABOLIC PANEL
ALT: 26 U/L (ref 0–44)
AST: 44 U/L — ABNORMAL HIGH (ref 15–41)
Albumin: 4 g/dL (ref 3.5–5.0)
Alkaline Phosphatase: 101 U/L (ref 38–126)
Anion gap: 16 — ABNORMAL HIGH (ref 5–15)
BUN: 12 mg/dL (ref 8–23)
CO2: 18 mmol/L — ABNORMAL LOW (ref 22–32)
Calcium: 9.5 mg/dL (ref 8.9–10.3)
Chloride: 102 mmol/L (ref 98–111)
Creatinine, Ser: 1.27 mg/dL — ABNORMAL HIGH (ref 0.61–1.24)
GFR calc Af Amer: >60 mL/min (ref >60)
GFR, EST NON AFRICAN AMERICAN: 59 mL/min — AB (ref >60)
Glucose, Bld: 94 mg/dL (ref 70–99)
Potassium: 3.9 mmol/L (ref 3.5–5.1)
Sodium: 136 mmol/L (ref 135–145)
Total Bilirubin: 3.8 mg/dL — ABNORMAL HIGH (ref 0.3–1.2)
Total Protein: 7 g/dL (ref 6.5–8.1)

## 2018-11-01 LAB — PROTIME-INR
INR: 1.16
Prothrombin Time: 14.7 seconds (ref 11.4–15.2)

## 2018-11-01 LAB — CG4 I-STAT (LACTIC ACID)
Lactic Acid, Venous: 6.02 mmol/L (ref 0.5–1.9)
Lactic Acid, Venous: 0.7 mmol/L (ref 0.5–1.9)

## 2018-11-01 MED ORDER — TRAMADOL HCL 50 MG PO TABS: 50.0000 mg | ORAL_TABLET | Freq: Four times a day (QID) | ORAL | 0 refills | Status: DC | PRN

## 2018-11-01 MED ORDER — SODIUM CHLORIDE 0.9 % IV BOLUS
1000.0000 mL | Freq: Once | INTRAVENOUS | Status: AC
  Administered 2018-11-01: 1000 mL via INTRAVENOUS

## 2018-11-01 NOTE — ED Notes (Signed)
ISTAT lactic acid preformed. Critical result of 6.02. Sam,RN made aware in triage. Md Paduchowski made aware and Lorrie,RN of pt made aware.

## 2018-11-01 NOTE — ED Notes (Signed)
ths [phone]

## 2018-11-01 NOTE — ED Triage Notes (Addendum)
[facility], left sided chest pain and hallucinations.  Pt states he started seeming lots of people in his yard that weren't there.  Pt has knee surgery December 2nd. Pt appears SOB and lips are dry.  Pt states he broke out into a sweat last night and earlier today. Pt also states ringing in his ears constantly.

## 2018-11-01 NOTE — ED Notes (Signed)
Pt able to urinate, pt voided around per ed tech, melanie. ED MD states no longer need to complete bladder scan at this time

## 2018-11-01 NOTE — ED Provider Notes (Signed)
[facility] Emergency Department Provider Note  Time seen: 3:32 PM  I have reviewed the triage vital signs and the nursing notes.   HISTORY  Chief Complaint Shortness of Breath and Chest Pain    HPI [name] is a 62 y.o. male with a past medical history of anxiety, CAD, gastric reflux, hypertension, hyperlipidemia, MI, diabetes, presents to the emergency department after hallucinations.  According to the patient he had a knee replacement 8 days ago, has been using hydrocodone at home for the past 4 days he has been hearing things in the house, and today saw several people on his front lawn.  Patient realizes that they are not real.  Wife states many years ago patient had similar hallucinations while taking oxycodone after another surgery.  Patient is currently taking hydrocodone following the surgery.  Patient states he has been lying in bed much more frequently since the surgery due to knee discomfort.  States today he developed mild chest pain and took a nitroglycerin with some improvement in the chest pain.  Denies any pleuritic pain.  Denies any shortness of breath.  Patient does state decreased appetite with decreased hydration.  Dark urine.  But no dysuria.  Denies any vomiting or diarrhea.  No abdominal pain.   Past Medical History:  Diagnosis Date  . Anxiety   . Atherosclerosis of coronary artery bypass graft with angina pectoris (HCC)   . Barrett's esophagus   . Benign enlargement of prostate   . Chronic constipation   . Colon polyp   . Coronary artery disease   . Depression   . GERD (gastroesophageal reflux disease)   . H/O esophageal spasm   . Hyperlipidemia   . Hypertension   . MI (myocardial infarction) (HCC) 2011 & 2014   x 2  . Sleep apnea     after weight loss no need for CPAP  . Type 2 diabetes mellitus [facility](HCC)     Patient Active Problem List   Diagnosis Date Noted  . Status post total knee replacement using cement, right  10/23/2018  . Status post unicompartmental knee replacement, right 07/19/2017  . Viral URI with cough 11/10/2015  . Falls 08/21/2015  . Coronary artery disease 08/21/2015  . Sleep apnea 03/26/2015  . Chest discomfort 01/28/2015  . Shortness of breath 01/28/2015  . S/P CABG x 2 01/28/2015  . Hyperlipidemia 01/28/2015  . Type 2 diabetes mellitus with other circulatory complications (HCC) 01/28/2015  . Morbid obesity (HCC) 01/28/2015  . Orthostatic hypotension 01/28/2015    Past Surgical History:  Procedure Laterality Date  . abscess cellulitis resection     . ANAL FISSURE REPAIR    . CARDIAC CATHETERIZATION  2011  . COLONOSCOPY    . CORONARY ANGIOPLASTY WITH STENT PLACEMENT  2011   stent placement x 3 @ [facility]; Dr. Darrold JunkerParaschos  . CORONARY ARTERY BYPASS GRAFT  2014   CABG x 2  . EXCISION MASS NECK  1985   faliculitis, incision became infected and was in hospital for 21 days  . MOUTH SURGERY  2019  . PARTIAL KNEE ARTHROPLASTY Right 07/19/2017   Procedure: UNICOMPARTMENTAL KNEE;  Surgeon: Christena FlakePoggi, John J, MD;  Location: [facility] ORS;  Service: Orthopedics;  Laterality: Right;  . TEE WITH CARDIOVERSION    . TOTAL KNEE ARTHROPLASTY Right 10/23/2018   Procedure: CONVERSION OF PARTIAL TO TOTAL KNEE ARTHROPLASTY;  Surgeon: Christena FlakePoggi, John J, MD;  Location: [facility] ORS;  Service: Orthopedics;  Laterality: Right;    Prior to  Admission medications   Medication Sig Start Date End Date Taking? Authorizing Provider  acetaminophen (TYLENOL) 325 MG tablet Take 650 mg by mouth every 6 (six) hours as needed (for pain/headaches.).     [provider]  albuterol (PROVENTIL HFA;VENTOLIN HFA) 108 (90 BASE) MCG/ACT inhaler Inhale into the lungs every 6 (six) hours as needed for wheezing or shortness of breath.    [provider]  ALPRAZolam Prudy Feeler) 0.5 MG tablet Take 0.25 mg by mouth at bedtime. 07/12/18   [provider]  atorvastatin (LIPITOR) 40 MG tablet Take 40 mg by mouth at bedtime.      [provider]  buPROPion (WELLBUTRIN SR) 150 MG 12 hr tablet Take 150 mg by mouth 2 (two) times daily.    [provider]  cetirizine (ZYRTEC) 10 MG tablet Take 10 mg by mouth at bedtime.    [provider]  diphenhydramine-acetaminophen (TYLENOL PM) 25-500 MG TABS tablet Take 1-2 tablets by mouth at bedtime as needed (for sleep.).    [provider]  enoxaparin (LOVENOX) 40 MG/0.4ML injection Inject 0.4 mLs (40 mg total) into the skin daily. 10/25/18   Anson Oregon, PA-C  ezetimibe (ZETIA) 10 MG tablet Take 1 tablet (10 mg total) by mouth daily. 01/21/17   Antonieta Iba, MD  fluticasone-salmeterol (ADVAIR HFA) 960-45 MCG/ACT inhaler Inhale 2 puffs into the lungs 2 (two) times daily. 05/12/16   Antonieta Iba, MD  HYDROcodone-acetaminophen (NORCO) 7.5-325 MG tablet Take 1-2 tablets by mouth every 4 (four) hours as needed for severe pain (pain score 7-10). 10/25/18   Anson Oregon, PA-C  ibuprofen (ADVIL,MOTRIN) 200 MG tablet Take 400 mg by mouth every 6 (six) hours as needed (for pain.).    [provider]  metFORMIN (GLUCOPHAGE) 850 MG tablet Take 850 mg by mouth daily with breakfast.     [provider]  midodrine (PROAMATINE) 10 MG tablet TAKE 1 TABLET (10 MG TOTAL) BY MOUTH 4 (FOUR) TIMES DAILY - AFTER MEALS AND AT BEDTIME. Patient taking differently: Take 20 mg by mouth daily.  05/01/18   Antonieta Iba, MD  nitroGLYCERIN (NITROSTAT) 0.4 MG SL tablet Place 1 tablet (0.4 mg total) under the tongue every 5 (five) minutes as needed for chest pain. 10/16/18   Antonieta Iba, MD  RABEprazole (ACIPHEX) 20 MG tablet Take 20 mg by mouth at bedtime.     [provider]  tamsulosin (FLOMAX) 0.4 MG CAPS capsule Take 0.4 mg by mouth at bedtime.     [provider]  traMADol (ULTRAM) 50 MG tablet Take 1 tablet (50 mg total) by mouth every 6 (six) hours. 10/25/18   Anson Oregon, PA-C  venlafaxine XR  (EFFEXOR-XR) 75 MG 24 hr capsule Take 225 mg by mouth daily.  05/23/17   [provider]    Allergies  Allergen Reactions  . Oxycodone Other (See Comments)    Hallucinations    Family History  Problem Relation Age of Onset  . Hypertension Mother   . Hyperlipidemia Mother   . Hypertension Father   . Hyperlipidemia Father     Social History Social History   Tobacco Use  . Smoking status: Never Smoker  . Smokeless tobacco: Never Used  Substance Use Topics  . Alcohol use: No  . Drug use: No    Review of Systems Constitutional: Negative for fever. Cardiovascular: Mild chest pain Respiratory: Negative for shortness of breath. Gastrointestinal: Negative for abdominal pain, vomiting and diarrhea. Genitourinary:  Negative for dysuria/hematuria.  Positive for dark urine. Musculoskeletal: Recent right knee replacement, no significant calf swelling or tenderness. Skin: Negative for skin complaints  Neurological: Negative for headache All other ROS negative  ____________________________________________   PHYSICAL EXAM:  VITAL SIGNS: ED Triage Vitals  Enc Vitals Group     BP 11/01/18 1501 97/67     Pulse Rate 11/01/18 1501 (!) 102     Resp 11/01/18 1501 18     Temp --      Temp src --      SpO2 11/01/18 1501 100 %     Weight 11/01/18 1502 215 lb (97.5 kg)     Height 11/01/18 1502 5\' 8"  (1.727 m)     Head Circumference --      Peak Flow --      Pain Score 11/01/18 1502 10     Pain Loc --      Pain Edu? --      Excl. in GC? --    Constitutional: Alert and oriented. Well appearing and in no distress. Eyes: Normal exam ENT   Head: Normocephalic and atraumatic.   Mouth/Throat: Dry mucous membranes Cardiovascular: Normal rate, regular rhythm.  Respiratory: Normal respiratory effort without tachypnea nor retractions. Breath sounds are clear  Gastrointestinal: Soft and nontender. No distention.   Musculoskeletal: Patient has a well-appearing incision to the  right knee covered with a honeycomb and clear dressing, no signs of dehiscence, largely nontender.  No signs of hematoma or abscess.  No significant tenderness or swelling. Neurologic:  Normal speech and language. No gross focal neurologic deficits  Skin:  Skin is warm.  Well-appearing incision. Psychiatric: Mood and affect are normal.   ____________________________________________    EKG  EKG viewed and interpreted by myself shows a normal sinus rhythm at 100 bpm with a narrow QRS, normal axis, normal intervals, no concerning ST changes.  ____________________________________________    RADIOLOGY  Chest x-ray is negative  ____________________________________________   INITIAL IMPRESSION / ASSESSMENT AND PLAN / ED COURSE  Pertinent labs & imaging results that were available during my care of the patient were reviewed by me and considered in my medical decision making (see chart for details).  Patient presents to the emergency department 8 days after right knee replacement with hallucinations.  Patient does appear very dehydrated, very dry mucous membranes.  Patient had a i-STAT lactic acid in triage of 6.0.  We will check labs and cultures as a precaution.  Awaiting chemistry to evaluate renal function.  We will begin IV hydration.  Patient's labs are largely within normal limits besides his lactate being elevated and an anion gap being elevated consistent with dehydration.  This would go along with the patient's dry mucous membranes.  No signs of infection, well-appearing incision, afebrile with otherwise reassuring vitals.  Heart rate is come down with IV fluids.  Repeat lactic acid after IV fluids is 0.7.  Overall patient appears very well I believe the patient would be safe for discharge home.  I discussed with the patient a trial of tramadol for pain control if needed instead of hydrocodone given his recent hallucinations.  Wife states the history of similar hallucinations after  oxycodone.  Likely opioid induced.  I discussed a trial of Tylenol only and tramadol only if needed for severe pain they are agreeable to this plan of care.  They will follow-up with his primary care doctor tomorrow.  I discussed return precautions for any further hallucinations, for any fever, they are  agreeable.  ____________________________________________   FINAL CLINICAL IMPRESSION(S) / ED DIAGNOSES  Hallucinations Dehydration   Minna Antis, MD 11/01/18 2035

## 2018-11-01 NOTE — ED Notes (Signed)
Pt attempted to stand beside bed to use urinal, Pt became very dizzy when standing and had to immediately sit down on the bed. Pt unable to provide sample at this time. Pt given urinal and shorts removed to use urinal when pt needs to urinate.

## 2018-11-01 NOTE — ED Notes (Signed)
One set of cultures sent to lab and rainbow

## 2018-11-01 NOTE — ED Notes (Signed)
MD at bedside. 

## 2018-11-01 NOTE — ED Notes (Signed)
Patient transported to X-ray 

## 2018-11-01 NOTE — Discharge Instructions (Addendum)
Please drink plenty of fluids.  Follow-up with your doctor tomorrow.  Return to the emergency department for any fever, any further hallucinations, or any symptom personally concerning to yourself.

## 2018-11-02 ENCOUNTER — Other Ambulatory Visit: Payer: Self-pay

## 2018-11-02 ENCOUNTER — Emergency Department: Payer: PPO

## 2018-11-02 ENCOUNTER — Observation Stay
Admission: EM | Admit: 2018-11-02 | Discharge: 2018-11-07 | Disposition: A | Discharge status: Home Health | Payer: PPO | Attending: Internal Medicine | Admitting: Internal Medicine

## 2018-11-02 ENCOUNTER — Encounter: Payer: Self-pay | Admitting: Emergency Medicine

## 2018-11-02 DIAGNOSIS — Z7984 Long term (current) use of oral hypoglycemic drugs: Secondary | ICD-10-CM | POA: Diagnosis not present

## 2018-11-02 DIAGNOSIS — E669 Obesity, unspecified: Secondary | ICD-10-CM | POA: Insufficient documentation

## 2018-11-02 DIAGNOSIS — R069 Unspecified abnormalities of breathing: Secondary | ICD-10-CM | POA: Diagnosis not present

## 2018-11-02 DIAGNOSIS — Z7982 Long term (current) use of aspirin: Secondary | ICD-10-CM | POA: Diagnosis not present

## 2018-11-02 DIAGNOSIS — F418 Other specified anxiety disorders: Secondary | ICD-10-CM | POA: Insufficient documentation

## 2018-11-02 DIAGNOSIS — I252 Old myocardial infarction: Secondary | ICD-10-CM | POA: Diagnosis not present

## 2018-11-02 DIAGNOSIS — R0602 Shortness of breath: Secondary | ICD-10-CM | POA: Diagnosis not present

## 2018-11-02 DIAGNOSIS — N400 Enlarged prostate without lower urinary tract symptoms: Secondary | ICD-10-CM | POA: Diagnosis not present

## 2018-11-02 DIAGNOSIS — E042 Nontoxic multinodular goiter: Secondary | ICD-10-CM | POA: Insufficient documentation

## 2018-11-02 DIAGNOSIS — I2510 Atherosclerotic heart disease of native coronary artery without angina pectoris: Secondary | ICD-10-CM | POA: Insufficient documentation

## 2018-11-02 DIAGNOSIS — Z6831 Body mass index (BMI) 31.0-31.9, adult: Secondary | ICD-10-CM | POA: Diagnosis not present

## 2018-11-02 DIAGNOSIS — E876 Hypokalemia: Secondary | ICD-10-CM | POA: Diagnosis not present

## 2018-11-02 DIAGNOSIS — E161 Other hypoglycemia: Secondary | ICD-10-CM | POA: Diagnosis not present

## 2018-11-02 DIAGNOSIS — Z79899 Other long term (current) drug therapy: Secondary | ICD-10-CM | POA: Insufficient documentation

## 2018-11-02 DIAGNOSIS — G92 Toxic encephalopathy: Secondary | ICD-10-CM | POA: Insufficient documentation

## 2018-11-02 DIAGNOSIS — R443 Hallucinations, unspecified: Secondary | ICD-10-CM

## 2018-11-02 DIAGNOSIS — G9340 Encephalopathy, unspecified: Secondary | ICD-10-CM | POA: Diagnosis not present

## 2018-11-02 DIAGNOSIS — G4730 Sleep apnea, unspecified: Secondary | ICD-10-CM | POA: Diagnosis not present

## 2018-11-02 DIAGNOSIS — E162 Hypoglycemia, unspecified: Secondary | ICD-10-CM | POA: Diagnosis not present

## 2018-11-02 DIAGNOSIS — Z96651 Presence of right artificial knee joint: Secondary | ICD-10-CM | POA: Diagnosis not present

## 2018-11-02 DIAGNOSIS — R410 Disorientation, unspecified: Principal | ICD-10-CM

## 2018-11-02 DIAGNOSIS — Z66 Do not resuscitate: Secondary | ICD-10-CM | POA: Diagnosis not present

## 2018-11-02 DIAGNOSIS — E785 Hyperlipidemia, unspecified: Secondary | ICD-10-CM | POA: Diagnosis not present

## 2018-11-02 DIAGNOSIS — Z885 Allergy status to narcotic agent status: Secondary | ICD-10-CM | POA: Diagnosis not present

## 2018-11-02 DIAGNOSIS — I10 Essential (primary) hypertension: Secondary | ICD-10-CM | POA: Diagnosis not present

## 2018-11-02 DIAGNOSIS — Z951 Presence of aortocoronary bypass graft: Secondary | ICD-10-CM | POA: Diagnosis not present

## 2018-11-02 DIAGNOSIS — R0789 Other chest pain: Secondary | ICD-10-CM | POA: Diagnosis not present

## 2018-11-02 DIAGNOSIS — R079 Chest pain, unspecified: Secondary | ICD-10-CM

## 2018-11-02 DIAGNOSIS — E119 Type 2 diabetes mellitus without complications: Secondary | ICD-10-CM | POA: Diagnosis not present

## 2018-11-02 LAB — URINALYSIS, COMPLETE (UACMP) WITH MICROSCOPIC
Bacteria, UA: NONE SEEN
Bilirubin Urine: NEGATIVE
Glucose, UA: NEGATIVE mg/dL
Ketones, ur: 20 mg/dL — AB
Leukocytes, UA: NEGATIVE
Nitrite: NEGATIVE
Protein, ur: NEGATIVE mg/dL
Specific Gravity, Urine: 1.039 — ABNORMAL HIGH (ref 1.005–1.030)
Squamous Epithelial / HPF: NONE SEEN (ref 0–5)
pH: 6 (ref 5.0–8.0)

## 2018-11-02 LAB — COMPREHENSIVE METABOLIC PANEL
ALBUMIN: 3.5 g/dL (ref 3.5–5.0)
ALT: 22 U/L (ref 0–44)
AST: 39 U/L (ref 15–41)
Alkaline Phosphatase: 91 U/L (ref 38–126)
Anion gap: 13 (ref 5–15)
BUN: 10 mg/dL (ref 8–23)
CO2: 16 mmol/L — ABNORMAL LOW (ref 22–32)
Calcium: 8.5 mg/dL — ABNORMAL LOW (ref 8.9–10.3)
Chloride: 107 mmol/L (ref 98–111)
Creatinine, Ser: 1.05 mg/dL (ref 0.61–1.24)
GFR calc non Af Amer: >60 mL/min (ref >60)
Glucose, Bld: 61 mg/dL — ABNORMAL LOW (ref 70–99)
Potassium: 3.2 mmol/L — ABNORMAL LOW (ref 3.5–5.1)
Sodium: 136 mmol/L (ref 135–145)
Total Bilirubin: 3.7 mg/dL — ABNORMAL HIGH (ref 0.3–1.2)
Total Protein: 6 g/dL — ABNORMAL LOW (ref 6.5–8.1)

## 2018-11-02 LAB — CBC WITH DIFFERENTIAL/PLATELET
Abs Immature Granulocytes: 0.11 10*3/uL — ABNORMAL HIGH (ref 0.00–0.07)
Basophils Absolute: 0.1 10*3/uL (ref 0.0–0.1)
Basophils Relative: 1 %
Eosinophils Absolute: 0.2 10*3/uL (ref 0.0–0.5)
Eosinophils Relative: 2 %
HCT: 35.2 % — ABNORMAL LOW (ref 39.0–52.0)
Hemoglobin: 11.3 g/dL — ABNORMAL LOW (ref 13.0–17.0)
Immature Granulocytes: 1 %
Lymphocytes Relative: 9 %
Lymphs Abs: 0.8 10*3/uL (ref 0.7–4.0)
MCH: 27.4 pg (ref 26.0–34.0)
MCHC: 32.1 g/dL (ref 30.0–36.0)
MCV: 85.4 fL (ref 80.0–100.0)
Monocytes Absolute: 0.7 10*3/uL (ref 0.1–1.0)
Monocytes Relative: 8 %
Neutro Abs: 7.1 10*3/uL (ref 1.7–7.7)
Neutrophils Relative %: 79 %
Platelets: 161 10*3/uL (ref 150–400)
RBC: 4.12 MIL/uL — ABNORMAL LOW (ref 4.22–5.81)
RDW: 14.5 % (ref 11.5–15.5)
WBC: 8.9 10*3/uL (ref 4.0–10.5)
nRBC: 0 % (ref 0.0–0.2)

## 2018-11-02 LAB — PROTIME-INR
INR: 1.23
Prothrombin Time: 15.4 seconds — ABNORMAL HIGH (ref 11.4–15.2)

## 2018-11-02 LAB — TROPONIN I
Troponin I: <0.03 ng/mL (ref <0.03)
Troponin I: <0.03 ng/mL (ref <0.03)
Troponin I: <0.03 ng/mL (ref <0.03)

## 2018-11-02 LAB — HEMOGLOBIN A1C
Hgb A1c MFr Bld: 4.8 % (ref 4.8–5.6)
Mean Plasma Glucose: 91.06 mg/dL

## 2018-11-02 LAB — GLUCOSE, CAPILLARY
Glucose-Capillary: 83 mg/dL (ref 70–99)
Glucose-Capillary: 111 mg/dL — ABNORMAL HIGH (ref 70–99)
Glucose-Capillary: 67 mg/dL — ABNORMAL LOW (ref 70–99)
Glucose-Capillary: 99 mg/dL (ref 70–99)

## 2018-11-02 MED ORDER — SODIUM CHLORIDE 0.9 % IV SOLN
INTRAVENOUS | Status: DC | PRN
  Administered 2018-11-02: 20 mL via INTRAVENOUS

## 2018-11-02 MED ORDER — KETOROLAC TROMETHAMINE 15 MG/ML IJ SOLN
15.0000 mg | Freq: Four times a day (QID) | INTRAMUSCULAR | Status: AC | PRN
  Filled 2018-11-02: qty 1

## 2018-11-02 MED ORDER — MIDODRINE HCL 5 MG PO TABS
10.0000 mg | ORAL_TABLET | Freq: Three times a day (TID) | ORAL | Status: DC
  Administered 2018-11-02 – 2018-11-07 (×14): 10 mg via ORAL
  Filled 2018-11-02 (×17): qty 2

## 2018-11-02 MED ORDER — ENOXAPARIN SODIUM 40 MG/0.4ML ~~LOC~~ SOLN
40.0000 mg | SUBCUTANEOUS | Status: DC
  Administered 2018-11-03 – 2018-11-07 (×5): 40 mg via SUBCUTANEOUS
  Filled 2018-11-02 (×6): qty 0.4

## 2018-11-02 MED ORDER — ALBUTEROL SULFATE (2.5 MG/3ML) 0.083% IN NEBU: 2.5000 mg | INHALATION_SOLUTION | Freq: Four times a day (QID) | RESPIRATORY_TRACT | Status: DC | PRN

## 2018-11-02 MED ORDER — ACETAMINOPHEN 325 MG PO TABS
650.0000 mg | ORAL_TABLET | Freq: Once | ORAL | Status: AC
  Administered 2018-11-02: 650 mg via ORAL
  Filled 2018-11-02: qty 2

## 2018-11-02 MED ORDER — IOHEXOL 350 MG/ML SOLN
75.0000 mL | Freq: Once | INTRAVENOUS | Status: AC | PRN
  Administered 2018-11-02: 75 mL via INTRAVENOUS

## 2018-11-02 MED ORDER — VENLAFAXINE HCL ER 150 MG PO CP24
150.0000 mg | ORAL_CAPSULE | Freq: Every day | ORAL | Status: DC
  Administered 2018-11-03 – 2018-11-04 (×2): 150 mg via ORAL
  Filled 2018-11-02: qty 2
  Filled 2018-11-02 (×2): qty 1
  Filled 2018-11-02: qty 2

## 2018-11-02 MED ORDER — POTASSIUM CHLORIDE CRYS ER 20 MEQ PO TBCR
40.0000 meq | EXTENDED_RELEASE_TABLET | Freq: Once | ORAL | Status: AC
  Administered 2018-11-02: 16:00:00 40 meq via ORAL
  Filled 2018-11-02: qty 2

## 2018-11-02 MED ORDER — NITROGLYCERIN 0.4 MG SL SUBL
0.4000 mg | SUBLINGUAL_TABLET | SUBLINGUAL | Status: DC | PRN
  Administered 2018-11-03: 0.4 mg via SUBLINGUAL
  Filled 2018-11-02: qty 1

## 2018-11-02 MED ORDER — MOMETASONE FURO-FORMOTEROL FUM 200-5 MCG/ACT IN AERO
2.0000 | INHALATION_SPRAY | Freq: Two times a day (BID) | RESPIRATORY_TRACT | Status: DC
  Administered 2018-11-02 – 2018-11-07 (×10): 2 via RESPIRATORY_TRACT
  Filled 2018-11-02: qty 8.8

## 2018-11-02 MED ORDER — TAMSULOSIN HCL 0.4 MG PO CAPS
0.4000 mg | ORAL_CAPSULE | Freq: Every day | ORAL | Status: DC
  Administered 2018-11-02 – 2018-11-06 (×5): 0.4 mg via ORAL
  Filled 2018-11-02 (×5): qty 1

## 2018-11-02 MED ORDER — ASPIRIN EC 81 MG PO TBEC
81.0000 mg | DELAYED_RELEASE_TABLET | Freq: Every day | ORAL | Status: DC
  Administered 2018-11-03 – 2018-11-07 (×5): 81 mg via ORAL
  Filled 2018-11-02 (×5): qty 1

## 2018-11-02 MED ORDER — ACETAMINOPHEN 325 MG PO TABS
650.0000 mg | ORAL_TABLET | Freq: Four times a day (QID) | ORAL | Status: DC | PRN
  Administered 2018-11-04 – 2018-11-07 (×4): 650 mg via ORAL
  Filled 2018-11-02 (×5): qty 2

## 2018-11-02 MED ORDER — ONDANSETRON HCL 4 MG PO TABS: 4.0000 mg | ORAL_TABLET | Freq: Four times a day (QID) | ORAL | Status: DC | PRN

## 2018-11-02 MED ORDER — INSULIN ASPART 100 UNIT/ML ~~LOC~~ SOLN: 0.0000 [IU] | Freq: Three times a day (TID) | SUBCUTANEOUS | Status: DC

## 2018-11-02 MED ORDER — MAGNESIUM SULFATE 2 GM/50ML IV SOLN
2.0000 g | Freq: Once | INTRAVENOUS | Status: AC
  Administered 2018-11-02: 2 g via INTRAVENOUS
  Filled 2018-11-02: qty 50

## 2018-11-02 MED ORDER — EZETIMIBE 10 MG PO TABS
10.0000 mg | ORAL_TABLET | Freq: Every day | ORAL | Status: DC
  Administered 2018-11-03 – 2018-11-07 (×5): 10 mg via ORAL
  Filled 2018-11-02 (×5): qty 1

## 2018-11-02 MED ORDER — PANTOPRAZOLE SODIUM 40 MG PO TBEC
40.0000 mg | DELAYED_RELEASE_TABLET | Freq: Every day | ORAL | Status: DC
  Administered 2018-11-02 – 2018-11-06 (×5): 40 mg via ORAL
  Filled 2018-11-02 (×5): qty 1

## 2018-11-02 MED ORDER — BUPROPION HCL ER (SR) 150 MG PO TB12
150.0000 mg | ORAL_TABLET | Freq: Two times a day (BID) | ORAL | Status: DC
  Administered 2018-11-02 – 2018-11-04 (×4): 150 mg via ORAL
  Filled 2018-11-02 (×5): qty 1

## 2018-11-02 MED ORDER — ATORVASTATIN CALCIUM 20 MG PO TABS
40.0000 mg | ORAL_TABLET | Freq: Every day | ORAL | Status: DC
  Administered 2018-11-02 – 2018-11-06 (×5): 40 mg via ORAL
  Filled 2018-11-02 (×5): qty 2

## 2018-11-02 MED ORDER — ONDANSETRON HCL 4 MG/2ML IJ SOLN: 4.0000 mg | Freq: Four times a day (QID) | INTRAMUSCULAR | Status: DC | PRN

## 2018-11-02 MED ORDER — INSULIN ASPART 100 UNIT/ML ~~LOC~~ SOLN: 0.0000 [IU] | Freq: Every day | SUBCUTANEOUS | Status: DC

## 2018-11-02 MED ORDER — LORATADINE 10 MG PO TABS
10.0000 mg | ORAL_TABLET | Freq: Every day | ORAL | Status: DC
  Administered 2018-11-02 – 2018-11-06 (×5): 10 mg via ORAL
  Filled 2018-11-02 (×5): qty 1

## 2018-11-02 NOTE — ED Notes (Signed)
Pt unable to void at this time, urine cup given and made aware of needed specimen.

## 2018-11-02 NOTE — ED Provider Notes (Addendum)
[facility] Emergency Department Provider Note  ____________________________________________   I have reviewed the triage vital signs and the nursing notes. Where available I have reviewed prior notes and, if possible and indicated, outside hospital notes.    HISTORY  Chief Complaint Chest Pain    HPI [name] is a 62 y.o. male with a history of CAD, with CABG, in the past, constipation, esophageal spasm hyperlipidemia anxiety, sleep apnea, MI, who had recent surgery on his right knee presented yesterday complaining of confusion from his pain medications and chest pain.  He was found to be dehydrated, he had reassuring blood work, he had IV fluids and he felt better.  Patient states that he has been having off and on but mostly on left-sided chest pain.  Hurts when he pulls up in the better changes position, is a tenderness in his chest wall.  He states that he does feel little bit short of breath.  He denies any fever or chills.  The pain does not seem similar to his heart attack pain.  Some sharp nonradiating discomfort that he can exactly specify in his left chest region.  He has had this pain for the last 4 days he states.  Is really they are mostly when he changes position.  He was moving around and felt that again get worse today.  No leg swelling. No fever no chills.  He is now taking tramadol and states he does not feel confused compared to what he was like on oxycodone  Past Medical History:  Diagnosis Date  . Anxiety   . Atherosclerosis of coronary artery bypass graft with angina pectoris (HCC)   . Barrett's esophagus   . Benign enlargement of prostate   . Chronic constipation   . Colon polyp   . Coronary artery disease   . Depression   . GERD (gastroesophageal reflux disease)   . H/O esophageal spasm   . Hyperlipidemia   . Hypertension   . MI (myocardial infarction) (HCC) 2011 & 2014   x 2  . Sleep apnea     after weight loss no need for  CPAP  . Type 2 diabetes mellitus [facility])     Patient Active Problem List   Diagnosis Date Noted  . Status post total knee replacement using cement, right 10/23/2018  . Status post unicompartmental knee replacement, right 07/19/2017  . Viral URI with cough 11/10/2015  . Falls 08/21/2015  . Coronary artery disease 08/21/2015  . Sleep apnea 03/26/2015  . Chest discomfort 01/28/2015  . Shortness of breath 01/28/2015  . S/P CABG x 2 01/28/2015  . Hyperlipidemia 01/28/2015  . Type 2 diabetes mellitus with other circulatory complications (HCC) 01/28/2015  . Morbid obesity (HCC) 01/28/2015  . Orthostatic hypotension 01/28/2015    Past Surgical History:  Procedure Laterality Date  . abscess cellulitis resection     . ANAL FISSURE REPAIR    . CARDIAC CATHETERIZATION  2011  . COLONOSCOPY    . CORONARY ANGIOPLASTY WITH STENT PLACEMENT  2011   stent placement x 3 @ [facility]; Dr. Darrold Junker  . CORONARY ARTERY BYPASS GRAFT  2014   CABG x 2  . EXCISION MASS NECK  1985   faliculitis, incision became infected and was in hospital for 21 days  . MOUTH SURGERY  2019  . PARTIAL KNEE ARTHROPLASTY Right 07/19/2017   Procedure: UNICOMPARTMENTAL KNEE;  Surgeon: Christena Flake, MD;  Location: [facility] ORS;  Service: Orthopedics;  Laterality: Right;  . TEE  WITH CARDIOVERSION    . TOTAL KNEE ARTHROPLASTY Right 10/23/2018   Procedure: CONVERSION OF PARTIAL TO TOTAL KNEE ARTHROPLASTY;  Surgeon: Christena Flake, MD;  Location: [facility] ORS;  Service: Orthopedics;  Laterality: Right;    Prior to Admission medications   Medication Sig Start Date End Date Taking? Authorizing Provider  acetaminophen (TYLENOL) 325 MG tablet Take 650 mg by mouth every 6 (six) hours as needed (for pain/headaches.).     [provider]  albuterol (PROVENTIL HFA;VENTOLIN HFA) 108 (90 BASE) MCG/ACT inhaler Inhale into the lungs every 6 (six) hours as needed for wheezing or shortness of breath.    [provider]  aspirin EC 81 MG  tablet Take 81 mg by mouth daily.    [provider]  atorvastatin (LIPITOR) 40 MG tablet Take 40 mg by mouth at bedtime.     [provider]  buPROPion (WELLBUTRIN SR) 150 MG 12 hr tablet Take 150 mg by mouth 2 (two) times daily.    [provider]  cetirizine (ZYRTEC) 10 MG tablet Take 10 mg by mouth at bedtime.    [provider]  diphenhydrAMINE (BENADRYL) 50 MG capsule Take 50 mg by mouth at bedtime as needed for sleep.    [provider]  enoxaparin (LOVENOX) 40 MG/0.4ML injection Inject 0.4 mLs (40 mg total) into the skin daily. 10/25/18   Anson Oregon, PA-C  ezetimibe (ZETIA) 10 MG tablet Take 1 tablet (10 mg total) by mouth daily. 01/21/17   Antonieta Iba, MD  fluticasone-salmeterol (ADVAIR HFA) 161-09 MCG/ACT inhaler Inhale 2 puffs into the lungs 2 (two) times daily. 05/12/16   Antonieta Iba, MD  HYDROcodone-acetaminophen (NORCO) 7.5-325 MG tablet Take 1-2 tablets by mouth every 4 (four) hours as needed for severe pain (pain score 7-10). 10/25/18   Anson Oregon, PA-C  ibuprofen (ADVIL,MOTRIN) 200 MG tablet Take 400 mg by mouth every 6 (six) hours as needed (for pain.).    [provider]  metFORMIN (GLUCOPHAGE) 850 MG tablet Take 850 mg by mouth daily with breakfast.     [provider]  midodrine (PROAMATINE) 10 MG tablet TAKE 1 TABLET (10 MG TOTAL) BY MOUTH 4 (FOUR) TIMES DAILY - AFTER MEALS AND AT BEDTIME. Patient taking differently: Take 20 mg by mouth daily.  05/01/18   Antonieta Iba, MD  nitroGLYCERIN (NITROSTAT) 0.4 MG SL tablet Place 1 tablet (0.4 mg total) under the tongue every 5 (five) minutes as needed for chest pain. 10/16/18   Antonieta Iba, MD  RABEprazole (ACIPHEX) 20 MG tablet Take 20 mg by mouth at bedtime.     [provider]  tamsulosin (FLOMAX) 0.4 MG CAPS capsule Take 0.4 mg by mouth at bedtime.     [provider]  traMADol (ULTRAM) 50 MG tablet Take 1 tablet (50  mg total) by mouth every 6 (six) hours as needed. 11/01/18   Minna Antis, MD  venlafaxine XR (EFFEXOR-XR) 75 MG 24 hr capsule Take 150 mg by mouth daily.  05/23/17   [provider]    Allergies Oxycodone  Family History  Problem Relation Age of Onset  . Hypertension Mother   . Hyperlipidemia Mother   . Hypertension Father   . Hyperlipidemia Father     Social History Social History   Tobacco Use  . Smoking status: Never Smoker  . Smokeless tobacco: Never Used  Substance Use Topics  . Alcohol use: No  . Drug use: No  Review of Systems Constitutional: No fever/chills Eyes: No visual changes. ENT: No sore throat. No stiff neck no neck pain Cardiovascular: + chest pain. Respiratory: Denies shortness of breath. Gastrointestinal:   no vomiting.  No diarrhea.  No constipation. Genitourinary: Negative for dysuria. Musculoskeletal: Negative lower extremity swelling Skin: Negative for rash. Neurological: Negative for severe headaches, focal weakness or numbness.   ____________________________________________   PHYSICAL EXAM:  VITAL SIGNS: ED Triage Vitals  Enc Vitals Group     BP 11/02/18 0916 130/68     Pulse Rate 11/02/18 0916 77     Resp 11/02/18 0916 20     Temp 11/02/18 0916 97.8 F (36.6 C)     Temp Source 11/02/18 0916 Oral     SpO2 11/02/18 0916 100 %     Weight 11/02/18 0919 210 lb (95.3 kg)     Height 11/02/18 0919 5\' 8"  (1.727 m)     Head Circumference --      Peak Flow --      Pain Score 11/02/18 0919 7     Pain Loc --      Pain Edu? --      Excl. in GC? --     Constitutional: Alert and oriented. Well appearing and in no acute distress.  He is anxious Eyes: Conjunctivae are normal Head: Atraumatic HEENT: No congestion/rhinnorhea. Mucous membranes are moist.  Oropharynx non-erythematous Neck:   Nontender with no meningismus, no masses, no stridor Chest: Tender palpation left chest wall when I touch this area patient states "ouch  right there that is the pain" and pulls back.  There is no lesions there is no crepitus there is no flail chest noted.  Is just at the insertion of the left pectoralis muscle.  No rib fracture noted. Cardiovascular: Normal rate, regular rhythm. Grossly normal heart sounds.  Good peripheral circulation. Respiratory: Normal respiratory effort.  No retractions. Lungs CTAB. Abdominal: Soft and nontender. No distention. No guarding no rebound Back:  There is no focal tenderness or step off.  there is no midline tenderness there are no lesions noted. there is no CVA tenderness Musculoskeletal: No lower extremity tenderness, no upper extremity tenderness. No joint effusions, no DVT signs strong distal pulses no edema Neurologic:  Normal speech and language. No gross focal neurologic deficits are appreciated.  Skin:  Skin is warm, dry and intact. No rash noted. Psychiatric: Mood and affect are somewhat anxious. Speech and behavior are normal.  ____________________________________________   LABS (all labs ordered are listed, but only abnormal results are displayed)  Labs Reviewed  CBC WITH DIFFERENTIAL/PLATELET - Abnormal; Notable for the following components:      Result Value   RBC 4.12 (*)    Hemoglobin 11.3 (*)    HCT 35.2 (*)    Abs Immature Granulocytes 0.11 (*)    All other components within normal limits  COMPREHENSIVE METABOLIC PANEL  TROPONIN I  PROTIME-INR  URINALYSIS, COMPLETE (UACMP) WITH MICROSCOPIC    Pertinent labs  results that were available during my care of the patient were reviewed by me and considered in my medical decision making (see chart for details). ____________________________________________  EKG  I personally interpreted any EKGs ordered by me or triage Atrial fibrillation is indicated by machine but I believe this to be sinus rhythm, rate 70 bpm with no acute ST elevation or depression ____________________________________________  RADIOLOGY  Pertinent  labs & imaging results that were available during my care of the patient were reviewed by me and considered  in my medical decision making (see chart for details). If possible, patient and/or family made aware of any abnormal findings.  Dg Chest 2 View  Result Date: 11/01/2018 CLINICAL DATA:  Chest pain EXAM: CHEST - 2 VIEW COMPARISON:  10/12/2018 FINDINGS: Post sternotomy changes. Low lung volume without acute airspace disease. Streaky atelectasis left base. Normal heart size. No pneumothorax. High-riding colon in the right upper quadrant. Stable mild lower thoracic compression. IMPRESSION: No active cardiopulmonary disease. Electronically Signed   By: Jasmine PangKim  Fujinaga M.D.   On: 11/01/2018 16:20   ____________________________________________    PROCEDURES  Procedure(s) performed: None  Procedures  Critical Care performed: None  ____________________________________________   INITIAL IMPRESSION / ASSESSMENT AND PLAN / ED COURSE  Pertinent labs & imaging results that were available during my care of the patient were reviewed by me and considered in my medical decision making (see chart for details).  Patient here for chest pain which is very reproducible.  He is using a walker which is unusual for him and has to pull himself on the bed without arm.  However, he also had recent surgery and this is his second presentation in 2 days.  I think is at this time in his best interest to have a CT scan, he is breathing somewhat quickly but his lungs are clear.  We will see if he has a blood clot, sometimes these can have a reproductive component going we will also send cardiac enzymes given his history of ACS, even though his EKG does not demonstrate any definitive ischemia despite 4 days of symptoms, and we will reassess  ----------------------------------------- 12:48 PM on 11/02/2018 -----------------------------------------  CT is negative, no evidence of infection, patient and family made  aware of findings, patient and family call me back into the room because they thought that he was seeing things again, he remains alert and oriented to me however they state that he seemed to be talking to his dog.  Patient was on oxycodone, they took him off of that, he does have a history of hallucinating with oxycodone, however he is still taking tramadol he took his last tramadol this morning.  It is certainly possible and even likely that he is having these events because of the medication as he has had them before.  However, this is his second visit in 2 days to the emergency room with complaints of chest pain which I think are musculoskeletal but also complaints of a recurrent hallucination pathology that is been going on now for some time despite a change in his medication.  this is most likely secondary to tramadol and if we take him off tramadol most likely will stop however if I send the patient home my concern is that the family will come in for the third time for this complaint.  I am always concerned about hallucinations in a patient this age.  I feel that taking him off these medications and observing him in the emergency department/hospital might not be a bad plan.  I talked to the hospitalist and they agree.    ____________________________________________   FINAL CLINICAL IMPRESSION(S) / ED DIAGNOSES  Final diagnoses:  None      This chart was dictated using voice recognition software.  Despite best efforts to proofread,  errors can occur which can change meaning.      Jeanmarie PlantMcShane, Nyanna Heideman A, MD 11/02/18 [id number]    Jeanmarie PlantMcShane, Kasumi Ditullio A, MD 11/02/18 1250

## 2018-11-02 NOTE — Progress Notes (Signed)
Patient ID: [name], male   DOB: 05/28/1956, 62 y.o.   MRN: [id number]  ACP note  Patient and wife at the bedside  Diagnosis:Hallucinations with acute encephalopathy, recent right knee replacement, hypokalemia, chest pain, thyroid nodules, type 2 diabetes, hypertension, hyperlipidemia, anxiety depression, BPH on Flomax, obesity  CODE STATUS discussed and patient wishes to be a full code  Plan.  Observe overnight.  Stop pain medications and monitor mental status and hallucinations.  Time spent on ACP discussion 17 minutes Dr. Alford Highlandichard Austin Herd

## 2018-11-02 NOTE — ED Triage Notes (Signed)
Pt to ED via EMS from home with c/o CP that started this am. Per EMS pt was hyperventilating upon arrival, hx of anxiety. PT was given 324mg  of asprin and 1 nitro in route, VSS, glucose 80.

## 2018-11-02 NOTE — Evaluation (Signed)
Physical Therapy Evaluation Patient Details Name: [name] MRN: [id number] DOB: [birth year] Today's Date: 11/02/2018   History of Present Illness  presented to ER secondary to AMS, chest pain/SOB, hallucinations; admitted with acute encephalopathy, likely due to pain medication.  Of note, patient with recent R TKR (12/2); has been home with outpatient PT services  Clinical Impression  Upon evaluation, patient alert and oriented; follows commands and demonstrates good effort/insight.  Denies hallucinations or persistent confusion at this time.  R LE grossly at least 4-/5 with R knee ROM approximately 4-90 degrees.  Able to complete bed mobility with mod indep; sit/stand and static standing balance with RW, min assist.  Significant drop in BP with transition to upright (see vitals flowsheet); unable to maintain standing for duration appropriate for full BP assessment (requiring return to seated position due to symptoms-SOB, dizziness).  Additional gait/mobility efforts deferred as result; will continue to assess/progress as medically appropriate.  RN informed/aware of findings. Would benefit from skilled PT to address above deficits and promote optimal return to PLOF; recommend discharge home with outpatient PT follow up as appropriate.    Follow Up Recommendations Outpatient PT(resume outpatient PT previously attending)    Equipment Recommendations       Recommendations for Other Services       Precautions / Restrictions Precautions Precautions: Fall Restrictions Weight Bearing Restrictions: Yes RLE Weight Bearing: Weight bearing as tolerated      Mobility  Bed Mobility Overal bed mobility: Modified Independent                Transfers Overall transfer level: Needs assistance Equipment used: Rolling walker (2 wheeled) Transfers: Sit to/from Stand Sit to Stand: Min assist;Min guard         General transfer comment: mildly tremulous with standing; increased anxiety  with movement transition. Unable to tolerate >10-20 seconds due to symptomatic orthostasis (see vitals flowsheet)  Ambulation/Gait             General Gait Details: deferred due to symptomatic orthostasis  Stairs            Wheelchair Mobility    Modified Rankin (Stroke Patients Only)       Balance Overall balance assessment: Needs assistance Sitting-balance support: No upper extremity supported;Feet supported Sitting balance-Leahy Scale: Good     Standing balance support: Bilateral upper extremity supported Standing balance-Leahy Scale: Fair                               Pertinent Vitals/Pain Pain Assessment: Faces Faces Pain Scale: Hurts little more Pain Location: R knee Pain Descriptors / Indicators: Aching;Grimacing;Guarding Pain Intervention(s): Limited activity within patient's tolerance;Monitored during session;Premedicated before session;Repositioned    Home Living Family/patient expects to be discharged to:: Private residence Living Arrangements: Spouse/significant other Available Help at Discharge: Family Type of Home: House Home Access: Stairs to enter Entrance Stairs-Rails: None Entrance Stairs-Number of Steps: 3 Home Layout: One level Home Equipment: Environmental consultant - 2 wheels      Prior Function Level of Independence: Independent         Comments: Indep without assist device for ADLs, household and community mobilization; recent use of RW since R TKR.  Was actively paticipating with outpatient PT services.     Hand Dominance        Extremity/Trunk Assessment   Upper Extremity Assessment Upper Extremity Assessment: Overall WFL for tasks assessed    Lower Extremity Assessment Lower Extremity Assessment: (  R knee grossly at least 4-/5, limited by pain; good quad activation and control, completing SLR without lag/dificulty)       Communication   Communication: No difficulties  Cognition Arousal/Alertness:  Awake/alert Behavior During Therapy: WFL for tasks assessed/performed Overall Cognitive Status: Within Functional Limits for tasks assessed                                        General Comments      Exercises Total Joint Exercises Goniometric ROM: R knee approx 4-90 degrees Other Exercises Other Exercises: Supine R LE therex, 1x10: ankle pumps, SAQs, heel slides, SLR Other Exercises: Orthostatic assessment attempt x2 (Sit/stand with RW, min assist each time). Unable to maintain standing for full duration to allow formal standing BP assesment due to symptoms.   Assessment/Plan    PT Assessment Patient needs continued PT services  PT Problem List Decreased strength;Decreased range of motion;Decreased activity tolerance;Decreased balance;Decreased mobility;Decreased knowledge of use of DME;Decreased safety awareness;Pain;Decreased coordination       PT Treatment Interventions DME instruction;Gait training;Stair training;Functional mobility training;Therapeutic activities;Neuromuscular re-education;Therapeutic exercise;Balance training;Patient/family education    PT Goals (Current goals can be found in the Care Plan section)  Acute Rehab PT Goals Patient Stated Goal: return home PT Goal Formulation: With patient/family Time For Goal Achievement: 11/16/18 Potential to Achieve Goals: Good    Frequency 7X/week   Barriers to discharge        Co-evaluation               AM-PAC PT "6 Clicks" Mobility  Outcome Measure Help needed turning from your back to your side while in a flat bed without using bedrails?: None Help needed moving from lying on your back to sitting on the side of a flat bed without using bedrails?: None Help needed moving to and from a bed to a chair (including a wheelchair)?: A Little Help needed standing up from a chair using your arms (e.g., wheelchair or bedside chair)?: A Lot Help needed to walk in hospital room?: Total Help needed  climbing 3-5 steps with a railing? : Total 6 Click Score: 15    End of Session Equipment Utilized During Treatment: Gait belt Activity Tolerance: Patient tolerated treatment well;Treatment limited secondary to medical complications (Comment)(symptomatic orthostasis) Patient left: in bed;with bed alarm set;with family/visitor present Nurse Communication: Mobility status(response to upright) PT Visit Diagnosis: Muscle weakness (generalized) (M62.81);Difficulty in walking, not elsewhere classified (R26.2)    Time: [id number] PT Time Calculation (min) (ACUTE ONLY): 40 min   Charges:   PT Evaluation $PT Eval Low Complexity: 1 Low PT Treatments $Therapeutic Exercise: 8-22 mins $Therapeutic Activity: 8-22 mins        [place]area Ohagan H. Manson PasseyBrown, PT, DPT, NCS 11/02/18, 10:39 PM [phone]

## 2018-11-02 NOTE — ED Notes (Signed)
Orange juice given at this time. 

## 2018-11-02 NOTE — H&P (Signed)
[facility] [place] [facility]   PATIENT NAME: [name]    MR#:  [id number]  DATE OF BIRTH:  September 06, 1956  DATE OF ADMISSION:  11/02/2018  PRIMARY CARE PHYSICIAN: Dorothey Baseman, MD   REQUESTING/REFERRING PHYSICIAN: Dr Ileana Roup  CHIEF COMPLAINT:   Chief Complaint  Patient presents with  . Chest Pain    HISTORY OF PRESENT ILLNESS:  [name]  is a 62 y.o. male coming back to the ER with hallucinations.  The patient had a right total knee replacement about a week and a half ago.  They were prescribing hydrocodone for him and he states he has been having hallucinations.  A few examples include seeing an Army of people dressed in Christmas uniforms, speaking to people that are not not there.  Trying to talk to the dog when the dog was not there, hearing radio and Bell's.  He is also having some chest discomfort pinpoint on his left lower chest.  Worse with movement.  He did associated with shortness of breath of this morning.  When he is working with the physical therapist yesterday he did get short of breath and was hallucinating so they brought him to the emergency room.  The patient is answering all questions appropriately currently.  Hospitalist services were contacted for evaluation.  PAST MEDICAL HISTORY:   Past Medical History:  Diagnosis Date  . Anxiety   . Atherosclerosis of coronary artery bypass graft with angina pectoris (HCC)   . Barrett's esophagus   . Benign enlargement of prostate   . Chronic constipation   . Colon polyp   . Coronary artery disease   . Depression   . GERD (gastroesophageal reflux disease)   . H/O esophageal spasm   . Hyperlipidemia   . Hypertension   . MI (myocardial infarction) (HCC) 2011 & 2014   x 2  . Sleep apnea     after weight loss no need for CPAP  . Type 2 diabetes mellitus (HCC)     PAST SURGICAL HISTORY:   Past Surgical History:  Procedure Laterality Date  . abscess cellulitis  resection     . ANAL FISSURE REPAIR    . CARDIAC CATHETERIZATION  2011  . COLONOSCOPY    . CORONARY ANGIOPLASTY WITH STENT PLACEMENT  2011   stent placement x 3 @ [facility]; Dr. Darrold Junker  . CORONARY ARTERY BYPASS GRAFT  2014   CABG x 2  . EXCISION MASS NECK  1985   faliculitis, incision became infected and was in hospital for 21 days  . MOUTH SURGERY  2019  . PARTIAL KNEE ARTHROPLASTY Right 07/19/2017   Procedure: UNICOMPARTMENTAL KNEE;  Surgeon: Christena Flake, MD;  Location: [facility] ORS;  Service: Orthopedics;  Laterality: Right;  . TEE WITH CARDIOVERSION    . TOTAL KNEE ARTHROPLASTY Right 10/23/2018   Procedure: CONVERSION OF PARTIAL TO TOTAL KNEE ARTHROPLASTY;  Surgeon: Christena Flake, MD;  Location: [facility] ORS;  Service: Orthopedics;  Laterality: Right;    SOCIAL HISTORY:   Social History   Tobacco Use  . Smoking status: Never Smoker  . Smokeless tobacco: Never Used  Substance Use Topics  . Alcohol use: No    FAMILY HISTORY:   Family History  Problem Relation Age of Onset  . Hypertension Mother   . Hyperlipidemia Mother   . Hypertension Father   . Hyperlipidemia Father     DRUG ALLERGIES:   Allergies  Allergen Reactions  . Oxycodone Other (See Comments)  Hallucinations    REVIEW OF SYSTEMS:  CONSTITUTIONAL: Patient states he has had some low-grade temperature and some sweats.  Some weight loss.  Positive for fatigue.  EYES: No blurred or double vision.  Wears glasses. EARS, NOSE, AND THROAT: No tinnitus or ear pain. No sore throat RESPIRATORY: No cough.positive for shortness of breath.  No wheezing or hemoptysis.  CARDIOVASCULAR: Positive for chest pain.  No orthopnea, edema.  GASTROINTESTINAL: No nausea, vomiting, diarrhea or abdominal pain. No blood in bowel movements.  Some constipation gENITOURINARY: Occasional dysuria.  No hematuria.  ENDOCRINE: No polyuria, nocturia,  HEMATOLOGY: No anemia, easy bruising or bleeding SKIN: No rash or lesion. MUSCULOSKELETAL:  Right knee pain NEUROLOGIC: No tingling, numbness, weakness.  PSYCHIATRY: History of anxiety and depression.   MEDICATIONS AT HOME:   Prior to Admission medications   Medication Sig Start Date End Date Taking? Authorizing Provider  albuterol (PROVENTIL HFA;VENTOLIN HFA) 108 (90 BASE) MCG/ACT inhaler Inhale into the lungs every 6 (six) hours as needed for wheezing or shortness of breath.   Yes [provider]  atorvastatin (LIPITOR) 40 MG tablet Take 40 mg by mouth at bedtime.    Yes [provider]  buPROPion (WELLBUTRIN SR) 150 MG 12 hr tablet Take 150 mg by mouth 2 (two) times daily.   Yes [provider]  cetirizine (ZYRTEC) 10 MG tablet Take 10 mg by mouth at bedtime.   Yes [provider]  enoxaparin (LOVENOX) 40 MG/0.4ML injection Inject 0.4 mLs (40 mg total) into the skin daily. 10/25/18  Yes Anson OregonMcGhee, James Lance, PA-C  ezetimibe (ZETIA) 10 MG tablet Take 1 tablet (10 mg total) by mouth daily. 01/21/17  Yes Antonieta IbaGollan, Timothy J, MD  fluticasone-salmeterol (ADVAIR HFA) 161-09115-21 MCG/ACT inhaler Inhale 2 puffs into the lungs 2 (two) times daily. 05/12/16  Yes Gollan, Tollie Pizzaimothy J, MD  metFORMIN (GLUCOPHAGE) 850 MG tablet Take 850 mg by mouth daily with breakfast.    Yes [provider]  midodrine (PROAMATINE) 10 MG tablet TAKE 1 TABLET (10 MG TOTAL) BY MOUTH 4 (FOUR) TIMES DAILY - AFTER MEALS AND AT BEDTIME. Patient taking differently: Take 20 mg by mouth daily.  05/01/18  Yes Gollan, Tollie Pizzaimothy J, MD  nitroGLYCERIN (NITROSTAT) 0.4 MG SL tablet Place 1 tablet (0.4 mg total) under the tongue every 5 (five) minutes as needed for chest pain. 10/16/18  Yes Gollan, Tollie Pizzaimothy J, MD  RABEprazole (ACIPHEX) 20 MG tablet Take 20 mg by mouth at bedtime.    Yes [provider]  tamsulosin (FLOMAX) 0.4 MG CAPS capsule Take 0.4 mg by mouth at bedtime.    Yes [provider]  venlafaxine XR (EFFEXOR-XR) 75 MG 24 hr capsule Take 150 mg by mouth daily.  05/23/17  Yes  [provider]  acetaminophen (TYLENOL) 325 MG tablet Take 650 mg by mouth every 6 (six) hours as needed (for pain/headaches.).     [provider]  aspirin EC 81 MG tablet Take 81 mg by mouth daily.    [provider]  diphenhydrAMINE (BENADRYL) 50 MG capsule Take 50 mg by mouth at bedtime as needed for sleep.    [provider]      VITAL SIGNS:  Blood pressure 122/74, pulse 70, temperature 97.8 F (36.6 C), temperature source Oral, resp. rate (!) 22, height 5\' 8"  (1.727 m), weight 95.3 kg, SpO2 99 %.  PHYSICAL EXAMINATION:  GENERAL:  62 y.o.-year-old patient lying in the bed with no acute distress.  EYES: Pupils equal, round, reactive  to light and accommodation. No scleral icterus. Extraocular muscles intact.  HEENT: Head atraumatic, normocephalic. Oropharynx and nasopharynx clear.  NECK:  Supple, no jugular venous distention. No thyroid enlargement, no tenderness.  LUNGS: Normal breath sounds bilaterally, no wheezing, rales,rhonchi or crepitation. No use of accessory muscles of respiration.  CARDIOVASCULAR: S1, S2 normal. No murmurs, rubs, or gallops.  ABDOMEN: Soft, nontender, nondistended. Bowel sounds present. No organomegaly or mass.  EXTREMITIES: No pedal edema, cyanosis, or clubbing.  NEUROLOGIC: Cranial nerves II through XII are intact. Muscle strength 5/5 in all extremities. Sensation intact. Gait not checked.  PSYCHIATRIC: The patient is alert and oriented x 3.  SKIN: No rash, lesion, or ulcer.  Right knee surgical site no signs of infection.  LABORATORY PANEL:   CBC Recent Labs  Lab 11/02/18 0922  WBC 8.9  HGB 11.3*  HCT 35.2*  PLT 161   ------------------------------------------------------------------------------------------------------------------  Chemistries  Recent Labs  Lab 11/02/18 0922  NA 136  K 3.2*  CL 107  CO2 16*  GLUCOSE 61*  BUN 10  CREATININE 1.05  CALCIUM 8.5*  AST 39  ALT 22  ALKPHOS 91  BILITOT  3.7*   ------------------------------------------------------------------------------------------------------------------  Cardiac Enzymes Recent Labs  Lab 11/02/18 1217  TROPONINI <0.03   ------------------------------------------------------------------------------------------------------------------  RADIOLOGY:  Dg Chest 2 View  Result Date: 11/01/2018 CLINICAL DATA:  Chest pain EXAM: CHEST - 2 VIEW COMPARISON:  10/12/2018 FINDINGS: Post sternotomy changes. Low lung volume without acute airspace disease. Streaky atelectasis left base. Normal heart size. No pneumothorax. High-riding colon in the right upper quadrant. Stable mild lower thoracic compression. IMPRESSION: No active cardiopulmonary disease. Electronically Signed   By: Jasmine Pang M.D.   On: 11/01/2018 16:20   Ct Angio Chest Pe W And/or Wo Contrast  Result Date: 11/02/2018 CLINICAL DATA:  Postop day 10 RIGHT total knee arthroplasty, presenting with acute onset of chest pain and shortness of breath. Prior CABG. EXAM: CT ANGIOGRAPHY CHEST WITH CONTRAST TECHNIQUE: Multidetector CT imaging of the chest was performed using the standard protocol during bolus administration of intravenous contrast. Multiplanar CT image reconstructions and MIPs were obtained to evaluate the vascular anatomy. CONTRAST:  75mL OMNIPAQUE IOHEXOL 350 MG/ML IV. COMPARISON:  None. FINDINGS: Cardiovascular: Contrast opacification of the pulmonary arteries is very good. No filling defects within either main pulmonary artery or their segmental branches in either lung to suggest pulmonary embolism. Prior CABG. Heart size upper normal. No pericardial effusion. Mild aortic valvular calcification. Mild to moderate atherosclerosis involving the thoracic and proximal abdominal aorta without evidence of aneurysm. Mediastinum/Nodes: Calcified lymph nodes involving the superior mediastinum ANTERIOR to the trachea. No pathologically enlarged mediastinal, hilar or axillary  lymph nodes. No mediastinal masses. Normal-appearing esophagus. Calcified subcentimeter nodules involving the isthmus and the RIGHT LOWER pole of the thyroid gland. Lungs/Pleura: Pulmonary parenchyma clear without localized airspace consolidation, interstitial disease, or parenchymal nodules or masses. Central airways patent without significant bronchial wall thickening. No pleural effusions. No pleural plaques or masses. Upper Abdomen: Minimal atrophy involving the body and tail of the pancreas. Remaining visualized upper abdomen unremarkable for the early arterial phase of enhancement. Musculoskeletal: Mild degenerative disc disease and spondylosis involving the mid and LOWER thoracic spine. Mild compression deformity of the UPPER endplate of T12, likely not acute. No acute findings. Review of the MIP images confirms the above findings. IMPRESSION: 1. No evidence of pulmonary embolism. 2. No acute cardiopulmonary disease. 3. Mild aortic valvular calcification. 4. Calcified subcentimeter nodules involving the isthmus and RIGHT LOWER pole of  the thyroid gland and adjacent calcified SUPERIOR mediastinal lymph nodes. Given the adjacent lymph nodes, thyroid ultrasound is recommended in further evaluation. This follows ACR consensus guidelines: Managing Incidental Thyroid Nodules Detected on Imaging: White Paper of the ACR Incidental Thyroid Findings Committee. J Am Coll Radiol 2015; 12:143-150. Aortic Atherosclerosis (ICD10-I70.0). Electronically Signed   By: Hulan Saas M.D.   On: 11/02/2018 10:33    EKG:   To me EKG looks normal sinus rhythm 78 bpm  IMPRESSION AND PLAN:   1.  Hallucinations with acute encephalopathy.  Likely secondary to pain medications.  Stop pain medications.  Tylenol for pain.  Severe pain can do Toradol. 2.  Recent right knee replacement.  Change pain control to Tylenol and PRN Toradol for right now. 3.  Hypokalemia replace magnesium and potassium 4.  Chest pain.  2 sets of  cardiac enzymes negative we will get a third set of cardiac enzymes.  Likely costochondritis.  Patient also has anxiety could be panic attack.  CT scan of the chest negative for pulmonary embolism 5.  Thyroid nodules and some superior mediastinal lymph nodes.  Check thyroid function tomorrow morning.  Will need outpatient ultrasound of the thyroid. 6.  Type 2 diabetes mellitus put on sliding scale.  Hold Glucophage after CAT scan 7.  Hypertension continue usual medications 8.  Hyperlipidemia unspecified on Lipitor 9.  Anxiety depression continue psychiatric medications 10.  BPH on Flomax 11.  Obesity with a BMI of 31.93.  Weight loss needed    All the records are reviewed and case discussed with ED provider. Management plans discussed with the patient, family and they are in agreement.  CODE STATUS: DNR  TOTAL TIME TAKING CARE OF THIS PATIENT: 50 minutes, including ACP time.    Alford Highland M.D on 11/02/2018 at 1:27 PM  Between 7am to 6pm - Pager - [phone]  After 6pm call admission pager [phone]  [facility]  [phone]  CC: Primary care physician; Dorothey Baseman, MD

## 2018-11-03 DIAGNOSIS — R443 Hallucinations, unspecified: Secondary | ICD-10-CM | POA: Diagnosis not present

## 2018-11-03 DIAGNOSIS — G9340 Encephalopathy, unspecified: Secondary | ICD-10-CM | POA: Diagnosis not present

## 2018-11-03 DIAGNOSIS — R079 Chest pain, unspecified: Secondary | ICD-10-CM | POA: Diagnosis not present

## 2018-11-03 DIAGNOSIS — R410 Disorientation, unspecified: Secondary | ICD-10-CM

## 2018-11-03 DIAGNOSIS — E876 Hypokalemia: Secondary | ICD-10-CM | POA: Diagnosis not present

## 2018-11-03 LAB — TSH: TSH: 1.981 u[IU]/mL (ref 0.350–4.500)

## 2018-11-03 LAB — GLUCOSE, CAPILLARY
GLUCOSE-CAPILLARY: 70 mg/dL (ref 70–99)
Glucose-Capillary: 71 mg/dL (ref 70–99)
Glucose-Capillary: 64 mg/dL — ABNORMAL LOW (ref 70–99)
Glucose-Capillary: 74 mg/dL (ref 70–99)
Glucose-Capillary: 69 mg/dL — ABNORMAL LOW (ref 70–99)
Glucose-Capillary: 80 mg/dL (ref 70–99)

## 2018-11-03 LAB — BASIC METABOLIC PANEL
Anion gap: 8 (ref 5–15)
BUN: 9 mg/dL (ref 8–23)
CO2: 22 mmol/L (ref 22–32)
Calcium: 8.3 mg/dL — ABNORMAL LOW (ref 8.9–10.3)
Chloride: 108 mmol/L (ref 98–111)
Creatinine, Ser: 1.04 mg/dL (ref 0.61–1.24)
GFR calc Af Amer: >60 mL/min (ref >60)
GFR calc non Af Amer: >60 mL/min (ref >60)
GLUCOSE: 84 mg/dL (ref 70–99)
Potassium: 3.9 mmol/L (ref 3.5–5.1)
Sodium: 138 mmol/L (ref 135–145)

## 2018-11-03 LAB — URINE CULTURE

## 2018-11-03 LAB — CBC
HCT: 32.8 % — ABNORMAL LOW (ref 39.0–52.0)
Hemoglobin: 10.3 g/dL — ABNORMAL LOW (ref 13.0–17.0)
MCH: 26.6 pg (ref 26.0–34.0)
MCHC: 31.4 g/dL (ref 30.0–36.0)
MCV: 84.8 fL (ref 80.0–100.0)
Platelets: 144 10*3/uL — ABNORMAL LOW (ref 150–400)
RBC: 3.87 MIL/uL — ABNORMAL LOW (ref 4.22–5.81)
RDW: 15 % (ref 11.5–15.5)
WBC: 6.8 10*3/uL (ref 4.0–10.5)
nRBC: 0 % (ref 0.0–0.2)

## 2018-11-03 LAB — HIV ANTIBODY (ROUTINE TESTING W REFLEX): HIV Screen 4th Generation wRfx: NONREACTIVE

## 2018-11-03 LAB — TROPONIN I
Troponin I: <0.03 ng/mL (ref <0.03)
Troponin I: <0.03 ng/mL (ref <0.03)
Troponin I: <0.03 ng/mL (ref <0.03)

## 2018-11-03 LAB — T4, FREE: Free T4: 0.9 ng/dL (ref 0.82–1.77)

## 2018-11-03 MED ORDER — LORAZEPAM 2 MG/ML IJ SOLN
1.0000 mg | Freq: Once | INTRAMUSCULAR | Status: AC
  Administered 2018-11-03: 13:00:00 1 mg via INTRAVENOUS
  Filled 2018-11-03: qty 1

## 2018-11-03 MED ORDER — OLANZAPINE 10 MG PO TBDP
10.0000 mg | ORAL_TABLET | Freq: Every day | ORAL | Status: DC
  Administered 2018-11-03: 10 mg via ORAL
  Filled 2018-11-03 (×3): qty 1

## 2018-11-03 MED ORDER — RISPERIDONE 1 MG PO TBDP
0.5000 mg | ORAL_TABLET | Freq: Four times a day (QID) | ORAL | Status: DC | PRN
  Administered 2018-11-04: 01:00:00 0.5 mg via ORAL
  Filled 2018-11-03 (×2): qty 0.5

## 2018-11-03 MED ORDER — ALPRAZOLAM 0.5 MG PO TABS
0.5000 mg | ORAL_TABLET | Freq: Three times a day (TID) | ORAL | Status: DC | PRN
  Administered 2018-11-03: 0.5 mg via ORAL
  Filled 2018-11-03: qty 1

## 2018-11-03 NOTE — Progress Notes (Signed)
Subjective:   S/P right total knee arthroplasty. Patient reports pain as mild.   Patient is well, and has had no acute complaints or problems Plan is to go Home after hospital stay. Negative for chest pain and shortness of breath Fever: no Gastrointestinal:Negative for nausea and vomiting Patient admitted for shortness of breath and hallucinations while taking pain medication. Reports some hallucinations last night but none during the day yesterday, I do not believe that the patient ingested any narcotics yesterday.  Objective: Vital signs in last 24 hours: Temp:  [97.8 F (36.6 C)-98.5 F (36.9 C)] 98.4 F (36.9 C) (12/13 0442) Pulse Rate:  [70-77] 70 (12/13 0442) Resp:  [17-22] 20 (12/13 0442) BP: (114-161)/(62-86) 135/76 (12/13 0442) SpO2:  [98 %-100 %] 98 % (12/13 0442) Weight:  [95.3 kg] 95.3 kg (12/12 0919)  Intake/Output from previous day:  Intake/Output Summary (Last 24 hours) at 11/03/2018 0823 Last data filed at 11/03/2018 0441 Gross per 24 hour  Intake 298.23 ml  Output 650 ml  Net -351.77 ml    Intake/Output this shift: No intake/output data recorded.  Labs: Recent Labs    11/01/18 1513 11/02/18 0922 11/03/18 0333  HGB 12.8* 11.3* 10.3*   Recent Labs    11/02/18 0922 11/03/18 0333  WBC 8.9 6.8  RBC 4.12* 3.87*  HCT 35.2* 32.8*  PLT 161 144*   Recent Labs    11/02/18 0922 11/03/18 0333  NA 136 138  K 3.2* 3.9  CL 107 108  CO2 16* 22  BUN 10 9  CREATININE 1.05 1.04  GLUCOSE 61* 84  CALCIUM 8.5* 8.3*   Recent Labs    11/01/18 1513 11/02/18 0922  INR 1.16 1.23     EXAM General - Patient is Alert, Appropriate and Oriented Extremity - ABD soft Neurovascular intact Sensation intact distally Intact pulses distally Dorsiflexion/Plantar flexion intact Incision: dressing C/D/I No cellulitis present Dressing/Incision - clean, dry, no drainage Motor Function - intact, moving foot and toes well on exam.  Abdomen soft with normal bowel  sounds this AM.  Past Medical History:  Diagnosis Date  . Anxiety   . Atherosclerosis of coronary artery bypass graft with angina pectoris (HCC)   . Barrett's esophagus   . Benign enlargement of prostate   . Chronic constipation   . Colon polyp   . Coronary artery disease   . Depression   . GERD (gastroesophageal reflux disease)   . H/O esophageal spasm   . Hyperlipidemia   . Hypertension   . MI (myocardial infarction) (HCC) 2011 & 2014   x 2  . Sleep apnea     after weight loss no need for CPAP  . Type 2 diabetes mellitus (HCC)     Assessment/Plan:   S/P right total knee arthroplasty Active Problems:   Acute encephalopathy  Estimated body mass index is 31.93 kg/m as calculated from the following:   Height as of this encounter: 5\' 8"  (1.727 m).   Weight as of this encounter: 95.3 kg.  Pt reports hallucinations last night prior to going to bed, did not occur during the day yesterday. CT scan of chest negative for acute PE. Patient is concerned that some of his symptoms could be anxiety related.  Patient reports that in the past he did take a low dose xanax, would recommend possible resumption of medication to see if this improves symptoms. Would continue to avoid any narcotic pain medication at this time. No signs of infection present to the right knee at today's  visit.  DVT Prophylaxis - Lovenox and TED hose Weight-Bearing as tolerated to right leg  J. Horris Latino, PA-C [facility] 11/03/2018, 8:23 AM

## 2018-11-03 NOTE — Consult Note (Signed)
[facility]   Reason for Consult: Consult for this 62 year old man in the hospital with chest pain but now with delirium as well. Referring Physician: Fonnie BirkenheadWhiting Patient Identification: [name] MRN:  [id number] Principal Diagnosis: Acute delirium Diagnosis:  Principal Problem:   Acute delirium Active Problems:   Acute encephalopathy   Total Time spent with patient: 1 hour  Subjective:   [name] is a 62 y.o. male patient admitted with "this is a church and these were the things that we owe to God".  HPI: Patient seen chart reviewed.  Fortunately patient's wife was in the room as well.  [age] year old man who recently had a knee replacement.  Developed some hallucinations possibly related to pain medication.  Back into the hospital complaining of chest pain although it does not seem its likely to be cardiac related.  Now having delirium.  Has been having spells of seeing things during the day.  During the interview this evening the patient was wide awake and having intermittent confusion.  He at first could not tell me where we were at all identifying this is a church and going off on a hyper religious tangent.  However when I pointed out the nurse he was able to identify her job correctly and sort of figure out that he was in the hospital.  Soon afterwards however he again went off on a hyper religious tangent.  Affect seemed to be upbeat actually and almost manicky.  Not agitated however not aggressive.  Wife tells me that he has been confused like this all day and it gets worse at night.  Has spells where he still appears to be seeing things in the room.  She says ever since he got his Xanax this evening things have been worse.  Social history: Wife is present in the room.  Good family support  Medical history: History of coronary artery disease prostate enlargement recent knee replacement  Substance abuse history: None  Past Psychiatric History: No  psychiatric notes in the chart.  Patient does appear to be on antidepressant medication.  No history of hospitalization or suicide attempts.  There reportedly were previous hallucinations on oxycodone and he may be having them on the hydrocodone as well.  Risk to Self:   Risk to Others:   Prior Inpatient Therapy:   Prior Outpatient Therapy:    Past Medical History:  Past Medical History:  Diagnosis Date  . Anxiety   . Atherosclerosis of coronary artery bypass graft with angina pectoris (HCC)   . Barrett's esophagus   . Benign enlargement of prostate   . Chronic constipation   . Colon polyp   . Coronary artery disease   . Depression   . GERD (gastroesophageal reflux disease)   . H/O esophageal spasm   . Hyperlipidemia   . Hypertension   . MI (myocardial infarction) (HCC) 2011 & 2014   x 2  . Sleep apnea     after weight loss no need for CPAP  . Type 2 diabetes mellitus (HCC)     Past Surgical History:  Procedure Laterality Date  . abscess cellulitis resection     . ANAL FISSURE REPAIR    . CARDIAC CATHETERIZATION  2011  . COLONOSCOPY    . CORONARY ANGIOPLASTY WITH STENT PLACEMENT  2011   stent placement x 3 @ [facility]; Dr. Darrold JunkerParaschos  . CORONARY ARTERY BYPASS GRAFT  2014   CABG x 2  . EXCISION MASS NECK  1985  faliculitis, incision became infected and was in hospital for 21 days  . MOUTH SURGERY  2019  . PARTIAL KNEE ARTHROPLASTY Right 07/19/2017   Procedure: UNICOMPARTMENTAL KNEE;  Surgeon: Christena Flake, MD;  Location: [facility] ORS;  Service: Orthopedics;  Laterality: Right;  . TEE WITH CARDIOVERSION    . TOTAL KNEE ARTHROPLASTY Right 10/23/2018   Procedure: CONVERSION OF PARTIAL TO TOTAL KNEE ARTHROPLASTY;  Surgeon: Christena Flake, MD;  Location: [facility] ORS;  Service: Orthopedics;  Laterality: Right;   Family History:  Family History  Problem Relation Age of Onset  . Hypertension Mother   . Hyperlipidemia Mother   . Hypertension Father   . Hyperlipidemia Father    Family  Psychiatric  History: None Social History:  Social History   Substance and Sexual Activity  Alcohol Use No     Social History   Substance and Sexual Activity  Drug Use No    Social History   Socioeconomic History  . Marital status: Married    Spouse name: Not on file  . Number of children: Not on file  . Years of education: Not on file  . Highest education level: Not on file  Occupational History  . Not on file  Social Needs  . Financial resource strain: Not on file  . Food insecurity:    Worry: Not on file    Inability: Not on file  . Transportation needs:    Medical: Not on file    Non-medical: Not on file  Tobacco Use  . Smoking status: Never Smoker  . Smokeless tobacco: Never Used  Substance and Sexual Activity  . Alcohol use: No  . Drug use: No  . Sexual activity: Not Currently  Lifestyle  . Physical activity:    Days per week: Not on file    Minutes per session: Not on file  . Stress: Not on file  Relationships  . Social connections:    Talks on phone: Not on file    Gets together: Not on file    Attends religious service: Not on file    Active member of club or organization: Not on file    Attends meetings of clubs or organizations: Not on file    Relationship status: Not on file  Other Topics Concern  . Not on file  Social History Narrative  . Not on file   Additional Social History:    Allergies:   Allergies  Allergen Reactions  . Oxycodone Other (See Comments)    Hallucinations    Labs:  Results for orders placed or performed during the hospital encounter of 11/02/18 (from the past 48 hour(s))  Comprehensive metabolic panel     Status: Abnormal   Collection Time: 11/02/18  9:22 AM  Result Value Ref Range   Sodium 136 135 - 145 mmol/L   Potassium 3.2 (L) 3.5 - 5.1 mmol/L   Chloride 107 98 - 111 mmol/L   CO2 16 (L) 22 - 32 mmol/L   Glucose, Bld 61 (L) 70 - 99 mg/dL   BUN 10 8 - 23 mg/dL   Creatinine, Ser 1.61 0.61 - 1.24 mg/dL    Calcium 8.5 (L) 8.9 - 10.3 mg/dL   Total Protein 6.0 (L) 6.5 - 8.1 g/dL   Albumin 3.5 3.5 - 5.0 g/dL   AST 39 15 - 41 U/L   ALT 22 0 - 44 U/L   Alkaline Phosphatase 91 38 - 126 U/L   Total Bilirubin 3.7 (H) 0.3 - 1.2 mg/dL  GFR calc non Af Amer >60 >60 mL/min   GFR calc Af Amer >60 >60 mL/min   Anion gap 13 5 - 15    Comment: Performed at [facility], [address] [place], [place] [address]  CBC with Differential     Status: Abnormal   Collection Time: 11/02/18  9:22 AM  Result Value Ref Range   WBC 8.9 4.0 - 10.5 K/uL   RBC 4.12 (L) 4.22 - 5.81 MIL/uL   Hemoglobin 11.3 (L) 13.0 - 17.0 g/dL   HCT 21.3 (L) 08.6 - 57.8 %   MCV 85.4 80.0 - 100.0 fL   MCH 27.4 26.0 - 34.0 pg   MCHC 32.1 30.0 - 36.0 g/dL   RDW 46.9 62.9 - 52.8 %   Platelets 161 150 - 400 K/uL   nRBC 0.0 0.0 - 0.2 %   Neutrophils Relative % 79 %   Neutro Abs 7.1 1.7 - 7.7 K/uL   Lymphocytes Relative 9 %   Lymphs Abs 0.8 0.7 - 4.0 K/uL   Monocytes Relative 8 %   Monocytes Absolute 0.7 0.1 - 1.0 K/uL   Eosinophils Relative 2 %   Eosinophils Absolute 0.2 0.0 - 0.5 K/uL   Basophils Relative 1 %   Basophils Absolute 0.1 0.0 - 0.1 K/uL   Immature Granulocytes 1 %   Abs Immature Granulocytes 0.11 (H) 0.00 - 0.07 K/uL    Comment: Performed at [facility], [address] [place], [place] [address]  Troponin I - Once     Status: None   Collection Time: 11/02/18  9:22 AM  Result Value Ref Range   Troponin I <0.03 <0.03 ng/mL    Comment: Performed at [facility], [address] [place], [place] [address]  Protime-INR     Status: Abnormal   Collection Time: 11/02/18  9:22 AM  Result Value Ref Range   Prothrombin Time 15.4 (H) 11.4 - 15.2 seconds   INR 1.23     Comment: Performed at [facility], [address] [place], [place] [address]  Urinalysis, Complete w Microscopic     Status: Abnormal   Collection Time: 11/02/18 11:23 AM  Result Value Ref Range   Color, Urine  YELLOW (A) YELLOW   APPearance CLEAR (A) CLEAR   Specific Gravity, Urine 1.039 (H) 1.005 - 1.030   pH 6.0 5.0 - 8.0   Glucose, UA NEGATIVE NEGATIVE mg/dL   Hgb urine dipstick SMALL (A) NEGATIVE   Bilirubin Urine NEGATIVE NEGATIVE   Ketones, ur 20 (A) NEGATIVE mg/dL   Protein, ur NEGATIVE NEGATIVE mg/dL   Nitrite NEGATIVE NEGATIVE   Leukocytes, UA NEGATIVE NEGATIVE   RBC / HPF 6-10 0 - 5 RBC/hpf   WBC, UA 0-5 0 - 5 WBC/hpf   Bacteria, UA NONE SEEN NONE SEEN   Squamous Epithelial / LPF NONE SEEN 0 - 5   Mucus PRESENT    Hyaline Casts, UA PRESENT     Comment: Performed at [facility], [address] [place], [place] [address]  Glucose, capillary     Status: None   Collection Time: 11/02/18 11:24 AM  Result Value Ref Range   Glucose-Capillary 83 70 - 99 mg/dL  Troponin I - Once     Status: None   Collection Time: 11/02/18 12:17 PM  Result Value Ref Range   Troponin I <0.03 <0.03 ng/mL    Comment: Performed at [facility], [address] [place], [place] [address]  Troponin I -  Once     Status: None   Collection Time: 11/02/18  3:27 PM  Result Value Ref Range   Troponin I <0.03 <0.03 ng/mL    Comment: Performed at [facility], [address] [place], [place] [address]  HIV antibody (Routine Testing)     Status: None   Collection Time: 11/02/18  3:27 PM  Result Value Ref Range   HIV Screen 4th Generation wRfx Non Reactive Non Reactive    Comment: (NOTE) Performed At: [facility] [address] [place], [place] [id number] Jolene Schimke MD IO:[id number]   Hemoglobin A1c     Status: None   Collection Time: 11/02/18  3:27 PM  Result Value Ref Range   Hgb A1c MFr Bld 4.8 4.8 - 5.6 %    Comment: (NOTE) Pre diabetes:          5.7%-6.4% Diabetes:              >6.4% Glycemic control for   <7.0% adults with diabetes    Mean Plasma Glucose 91.06 mg/dL    Comment: Performed at [facility], [address] [place], [place]  [address]  Glucose, capillary     Status: Abnormal   Collection Time: 11/02/18  4:34 PM  Result Value Ref Range   Glucose-Capillary 67 (L) 70 - 99 mg/dL  Glucose, capillary     Status: Abnormal   Collection Time: 11/02/18  5:09 PM  Result Value Ref Range   Glucose-Capillary 111 (H) 70 - 99 mg/dL  Glucose, capillary     Status: None   Collection Time: 11/02/18  8:45 PM  Result Value Ref Range   Glucose-Capillary 99 70 - 99 mg/dL  Basic metabolic panel     Status: Abnormal   Collection Time: 11/03/18  3:33 AM  Result Value Ref Range   Sodium 138 135 - 145 mmol/L   Potassium 3.9 3.5 - 5.1 mmol/L   Chloride 108 98 - 111 mmol/L   CO2 22 22 - 32 mmol/L   Glucose, Bld 84 70 - 99 mg/dL   BUN 9 8 - 23 mg/dL   Creatinine, Ser 0.27 0.61 - 1.24 mg/dL   Calcium 8.3 (L) 8.9 - 10.3 mg/dL   GFR calc non Af Amer >60 >60 mL/min   GFR calc Af Amer >60 >60 mL/min   Anion gap 8 5 - 15    Comment: Performed at [facility], [address] [place], [place] [address]  CBC     Status: Abnormal   Collection Time: 11/03/18  3:33 AM  Result Value Ref Range   WBC 6.8 4.0 - 10.5 K/uL   RBC 3.87 (L) 4.22 - 5.81 MIL/uL   Hemoglobin 10.3 (L) 13.0 - 17.0 g/dL   HCT 44.0 (L) 34.7 - 42.5 %   MCV 84.8 80.0 - 100.0 fL   MCH 26.6 26.0 - 34.0 pg   MCHC 31.4 30.0 - 36.0 g/dL   RDW 95.6 38.7 - 56.4 %   Platelets 144 (L) 150 - 400 K/uL   nRBC 0.0 0.0 - 0.2 %    Comment: Performed at [facility], [address] [place], [place] [address]  TSH     Status: None   Collection Time: 11/03/18  3:33 AM  Result Value Ref Range   TSH 1.981 0.350 - 4.500 uIU/mL    Comment: Performed by a 3rd Generation assay with a functional sensitivity of <=0.01 uIU/mL. Performed at [facility], [address]  [place], [place] [address]   T4, free     Status: None   Collection Time: 11/03/18  3:33 AM  Result Value Ref Range   Free T4 0.90 0.82 - 1.77 ng/dL    Comment: (NOTE) Biotin ingestion may  interfere with free T4 tests. If the results are inconsistent with the TSH level, previous test results, or the clinical presentation, then consider biotin interference. If needed, order repeat testing after stopping biotin. Performed at [facility], [address] [place], [place] [address]   Glucose, capillary     Status: None   Collection Time: 11/03/18  7:34 AM  Result Value Ref Range   Glucose-Capillary 71 70 - 99 mg/dL  Troponin I - STAT Now Then Q4H     Status: None   Collection Time: 11/03/18 11:30 AM  Result Value Ref Range   Troponin I <0.03 <0.03 ng/mL    Comment: Performed at [facility], [address] [place], [place] [address]  Glucose, capillary     Status: Abnormal   Collection Time: 11/03/18 11:42 AM  Result Value Ref Range   Glucose-Capillary 64 (L) 70 - 99 mg/dL  Glucose, capillary     Status: None   Collection Time: 11/03/18 11:56 AM  Result Value Ref Range   Glucose-Capillary 74 70 - 99 mg/dL  Troponin I - STAT Now Then Q4H     Status: None   Collection Time: 11/03/18  2:37 PM  Result Value Ref Range   Troponin I <0.03 <0.03 ng/mL    Comment: Performed at [facility], [address] [place], [place] [address]  Glucose, capillary     Status: None   Collection Time: 11/03/18  4:55 PM  Result Value Ref Range   Glucose-Capillary 70 70 - 99 mg/dL  Troponin I - STAT Now Then Q4H     Status: None   Collection Time: 11/03/18  6:32 PM  Result Value Ref Range   Troponin I <0.03 <0.03 ng/mL    Comment: Performed at [facility], [address] [place], [place] [address]    Current Facility-Administered Medications  Medication Dose Route Frequency Provider Last Rate Last Dose  . 0.9 %  sodium chloride infusion   Intravenous PRN Alford Highland, MD   Stopped at 11/02/18 1815  . acetaminophen (TYLENOL) tablet 650 mg  650 mg Oral Q6H PRN Wieting, Richard, MD      . albuterol (PROVENTIL) (2.5 MG/3ML) 0.083% nebulizer  solution 2.5 mg  2.5 mg Inhalation Q6H PRN Wieting, Richard, MD      . aspirin EC tablet 81 mg  81 mg Oral Daily Alford Highland, MD   81 mg at 11/03/18 0942  . atorvastatin (LIPITOR) tablet 40 mg  40 mg Oral QHS Alford Highland, MD   40 mg at 11/02/18 2128  . buPROPion (WELLBUTRIN SR) 12 hr tablet 150 mg  150 mg Oral BID Alford Highland, MD   150 mg at 11/03/18 0942  . enoxaparin (LOVENOX) injection 40 mg  40 mg Subcutaneous Q24H Alford Highland, MD   40 mg at 11/03/18 0942  . ezetimibe (ZETIA) tablet 10 mg  10 mg Oral Daily Alford Highland, MD   10 mg at 11/03/18 0942  . ketorolac (TORADOL) 15 MG/ML injection 15 mg  15 mg Intravenous Q6H PRN Wieting, Richard, MD      . loratadine (CLARITIN) tablet 10 mg  10 mg Oral QHS Alford Highland, MD   10 mg at 11/02/18 2129  . midodrine (  PROAMATINE) tablet 10 mg  10 mg Oral TID PC Alford Highland, MD   10 mg at 11/03/18 1703  . mometasone-formoterol (DULERA) 200-5 MCG/ACT inhaler 2 puff  2 puff Inhalation BID Alford Highland, MD   2 puff at 11/03/18 [phone]  . nitroGLYCERIN (NITROSTAT) SL tablet 0.4 mg  0.4 mg Sublingual Q5 min PRN Alford Highland, MD   0.4 mg at 11/03/18 1017  . OLANZapine zydis (ZYPREXA) disintegrating tablet 10 mg  10 mg Oral QHS Simrit Gohlke T, MD      . ondansetron (ZOFRAN) tablet 4 mg  4 mg Oral Q6H PRN Wieting, Richard, MD       Or  . ondansetron (ZOFRAN) injection 4 mg  4 mg Intravenous Q6H PRN Wieting, Richard, MD      . pantoprazole (PROTONIX) EC tablet 40 mg  40 mg Oral QHS Alford Highland, MD   40 mg at 11/02/18 2128  . risperiDONE (RISPERDAL M-TABS) disintegrating tablet 0.5 mg  0.5 mg Oral Q6H PRN Jeshurun Oaxaca T, MD      . tamsulosin (FLOMAX) capsule 0.4 mg  0.4 mg Oral QHS Alford Highland, MD   0.4 mg at 11/02/18 2129  . venlafaxine XR (EFFEXOR-XR) 24 hr capsule 150 mg  150 mg Oral Daily Alford Highland, MD   150 mg at 11/03/18 6213    Musculoskeletal: Strength & Muscle Tone: within normal limits Gait &  Station: unable to stand Patient leans: N/A  Psychiatric Specialty Exam: Physical Exam  Nursing note and vitals reviewed. Constitutional: He appears well-developed and well-nourished.  HENT:  Head: Normocephalic and atraumatic.  Eyes: Pupils are equal, round, and reactive to light. Conjunctivae are normal.  Neck: Normal range of motion.  Cardiovascular: Regular rhythm and normal heart sounds.  Respiratory: Effort normal. No respiratory distress.  GI: Soft.  Musculoskeletal: Normal range of motion.       Legs:  Neurological: He is alert.  Skin: Skin is warm and dry.  Psychiatric: His affect is labile. His speech is tangential. He is not agitated. Thought content is not paranoid. Cognition and memory are impaired. He expresses impulsivity. He expresses no homicidal and no suicidal ideation.    Review of Systems  Constitutional: Negative.   HENT: Negative.   Eyes: Negative.   Respiratory: Negative.   Cardiovascular: Positive for chest pain.  Gastrointestinal: Negative.   Musculoskeletal: Negative.   Skin: Negative.   Neurological: Negative.   Psychiatric/Behavioral: Negative for depression, hallucinations, memory loss, substance abuse and suicidal ideas. The patient has insomnia. The patient is not nervous/anxious.     Blood pressure (!) 146/87, pulse 74, temperature 97.8 F (36.6 C), temperature source Oral, resp. rate 18, height 5\' 8"  (1.727 m), weight 95.3 kg, SpO2 98 %.Body mass index is 31.93 kg/m.  General Appearance: Casual  Eye Contact:  Fair  Speech:  Pressured  Volume:  Increased  Mood:  Euphoric  Affect:  Congruent  Thought Process:  Disorganized  Orientation:  Negative  Thought Content:  Illogical and Hallucinations: Visual  Suicidal Thoughts:  No  Homicidal Thoughts:  No  Memory:  Immediate;   Fair Recent;   Poor Remote;   Poor  Judgement:  Impaired  Insight:  Shallow  Psychomotor Activity:  Increased  Concentration:  Concentration: Poor  Recall:  Fair    Fund of Knowledge:  Good  Language:  Good  Akathisia:  No  Handed:  Right  AIMS (if indicated):     Assets:  Desire for Improvement Housing Social Support  ADL's:  Impaired  Cognition:  Impaired,  Mild  Sleep:        Treatment Plan Summary: Daily contact with patient to assess and evaluate symptoms and progress in treatment, Medication management and Plan Patient having delirium probably multifactorial in part from pain medication and from hospitalization.  Benzodiazepines probably not a good choice for managing delirium and seemed to worsen things this evening.  I am discontinuing the Xanax and giving him a 10 mg dose of olanzapine tonight for delirium and sleep.  I will also add low dose 0.5 mg Risperdal that can be given every 6 hours as needed for agitation and confusion.  I will follow-up with the patient over the weekend.  Disposition: No evidence of imminent risk to self or others at present.   Patient does not meet criteria for psychiatric inpatient admission. Supportive therapy provided about ongoing stressors.  Mordecai Rasmussen, MD 11/03/2018 8:56 PM

## 2018-11-03 NOTE — Progress Notes (Addendum)
Physical Therapy Treatment Patient Details Name: [name] MRN: [id number] DOB: 1956/08/15 Today's Date: 11/03/2018    History of Present Illness presented to ER secondary to AMS, chest pain/SOB, hallucinations; admitted with acute encephalopathy, likely due to pain medication.  Of note, patient with recent R TKR (12/2); has been home with outpatient PT services    PT Comments    Wife in attendance.  Stated pt continues with confusion and hallucinations.  Pt anxious at times during discussion and reports in detail a fall at home where his feet got tangled in the blankets and he fell directly on his knee and he thinks he injured it.  Wife disputed his story and stated it did not happen.  BP's taken in supine 131/81 P 79 and again in sitting 124/78 P 92 as orthostatic BP's limited gait yesterday.  Once sitting, overall anxiety increased and he wanted to lay back down but with encouragement and calming he was able to remain sitting.  He agreed to attempt standing.  Stood with mod a x 2 and very unsteady balance.  Pt picking walker up several inches off the floor and unable to place walker back on floor.  Pt asking for staff to let go of him despite worsening balance and safety.  Pt instructed to lay back down.  No reports of dizziness today but overall poor mobility, safety and judgement.  Wife reported and confirmed with nurse tech +2 assist to commode earlier with difficulty.    Due to continued decline in mobility since eval yesterday, will adjust discharge disposition to SNF as pt is +2 Mod/max assist for standing at this time.  Pt is unsafe to attempt mobility without +2 assist and gait belt.     Follow Up Recommendations  SNF     Equipment Recommendations  None recommended by PT    Recommendations for Other Services       Precautions / Restrictions Precautions Precautions: Fall Restrictions Weight Bearing Restrictions: Yes RLE Weight Bearing: Weight bearing as tolerated     Mobility  Bed Mobility Overal bed mobility: Needs Assistance Bed Mobility: Supine to Sit     Supine to sit: Min guard Sit to supine: Min assist   General bed mobility comments: poor motor planning and atypical movement patterns.  Transfers Overall transfer level: Needs assistance Equipment used: Rolling walker (2 wheeled) Transfers: Sit to/from Stand Sit to Stand: Mod assist;+2 physical assistance         General transfer comment: very tremulous today with increased anxiety.  Picking walker up and unable to put it on the ground.  Ambulation/Gait             General Gait Details: unsafe and deferred for pt and staff safety.   Stairs             Wheelchair Mobility    Modified Rankin (Stroke Patients Only)       Balance Overall balance assessment: Needs assistance Sitting-balance support: No upper extremity supported;Feet supported Sitting balance-Leahy Scale: Fair     Standing balance support: Bilateral upper extremity supported Standing balance-Leahy Scale: Poor                              Cognition Arousal/Alertness: Awake/alert Behavior During Therapy: Impulsive;Anxious Overall Cognitive Status: Impaired/Different from baseline Area of Impairment: Attention;Memory;Safety/judgement;Awareness  Safety/Judgement: Decreased awareness of safety     General Comments: Confusion and halucinations reported by wife in room.  Reporting a fall at home which wife stated is not acurate.      Exercises Other Exercises Other Exercises: Supine R LE therex, 1x10: ankle pumps, SAQs, heel slides, SLR    General Comments        Pertinent Vitals/Pain Pain Assessment: Faces Faces Pain Scale: Hurts little more Pain Location: R knee with AAROM Pain Descriptors / Indicators: Aching;Grimacing;Guarding Pain Intervention(s): Limited activity within patient's tolerance;Monitored during session    Home Living                       Prior Function            PT Goals (current goals can now be found in the care plan section) Progress towards PT goals: Not progressing toward goals - comment    Frequency    7X/week      PT Plan Discharge plan needs to be updated    Co-evaluation              AM-PAC PT "6 Clicks" Mobility   Outcome Measure  Help needed turning from your back to your side while in a flat bed without using bedrails?: A Little Help needed moving from lying on your back to sitting on the side of a flat bed without using bedrails?: A Little Help needed moving to and from a bed to a chair (including a wheelchair)?: A Lot Help needed standing up from a chair using your arms (e.g., wheelchair or bedside chair)?: A Lot Help needed to walk in hospital room?: Total Help needed climbing 3-5 steps with a railing? : Total 6 Click Score: 12    End of Session Equipment Utilized During Treatment: Gait belt Activity Tolerance: Other (comment) Patient left: in bed;with bed alarm set;with family/visitor present Nurse Communication: Mobility status       Time: [id number] PT Time Calculation (min) (ACUTE ONLY): 17 min  Charges:  $Therapeutic Activity: 8-22 mins                     Danielle DessSarah Carah Barrientes, PTA 11/03/18, 3:02 PM

## 2018-11-03 NOTE — Progress Notes (Signed)
[employer] [facility] [place] at [facility]   PATIENT NAME: [name]    MR#:  [id number]  DATE OF BIRTH:  Feb 28, 1956  SUBJECTIVE:  CHIEF COMPLAINT:   Chief Complaint  Patient presents with  . Chest Pain   Recent knee surgery. Came with chest pain and severe anxiety. Had a panic attack in morning, which subsided after getting ativan injection.  REVIEW OF SYSTEMS:  CONSTITUTIONAL: No fever, fatigue or weakness.  EYES: No blurred or double vision.  EARS, NOSE, AND THROAT: No tinnitus or ear pain.  RESPIRATORY: No cough, shortness of breath, wheezing or hemoptysis.  CARDIOVASCULAR: No chest pain, orthopnea, edema.  GASTROINTESTINAL: No nausea, vomiting, diarrhea or abdominal pain.  GENITOURINARY: No dysuria, hematuria.  ENDOCRINE: No polyuria, nocturia,  HEMATOLOGY: No anemia, easy bruising or bleeding SKIN: No rash or lesion. MUSCULOSKELETAL: No joint pain or arthritis.   NEUROLOGIC: No tingling, numbness, weakness.  PSYCHIATRY: Have anxiety and hallucinations.   ROS  DRUG ALLERGIES:   Allergies  Allergen Reactions  . Oxycodone Other (See Comments)    Hallucinations    VITALS:  Blood pressure (!) 146/87, pulse 74, temperature 97.8 F (36.6 C), temperature source Oral, resp. rate 18, height 5\' 8"  (1.727 m), weight 95.3 kg, SpO2 98 %.  PHYSICAL EXAMINATION:  GENERAL:  62 y.o.-year-old patient lying in the bed with no acute distress.  EYES: Pupils equal, round, reactive to light and accommodation. No scleral icterus. Extraocular muscles intact.  HEENT: Head atraumatic, normocephalic. Oropharynx and nasopharynx clear.  NECK:  Supple, no jugular venous distention. No thyroid enlargement, no tenderness.  LUNGS: Normal breath sounds bilaterally, no wheezing, rales,rhonchi or crepitation. No use of accessory muscles of respiration.  CARDIOVASCULAR: S1, S2 normal. No murmurs, rubs, or gallops.  ABDOMEN: Soft, nontender, nondistended. Bowel sounds present. No  organomegaly or mass.  EXTREMITIES: No pedal edema, cyanosis, or clubbing. Right knee surgery and stiches present. NEUROLOGIC: Cranial nerves II through XII are intact. Muscle strength 5/5 in all extremities. Sensation intact. Gait not checked.  PSYCHIATRIC: The patient is alert and oriented x 3.  SKIN: No obvious rash, lesion, or ulcer.   Physical Exam LABORATORY PANEL:   CBC Recent Labs  Lab 11/03/18 0333  WBC 6.8  HGB 10.3*  HCT 32.8*  PLT 144*   ------------------------------------------------------------------------------------------------------------------  Chemistries  Recent Labs  Lab 11/02/18 0922 11/03/18 0333  NA 136 138  K 3.2* 3.9  CL 107 108  CO2 16* 22  GLUCOSE 61* 84  BUN 10 9  CREATININE 1.05 1.04  CALCIUM 8.5* 8.3*  AST 39  --   ALT 22  --   ALKPHOS 91  --   BILITOT 3.7*  --    ------------------------------------------------------------------------------------------------------------------  Cardiac Enzymes Recent Labs  Lab 11/03/18 1437 11/03/18 1832  TROPONINI <0.03 <0.03   ------------------------------------------------------------------------------------------------------------------  RADIOLOGY:  Ct Angio Chest Pe W And/or Wo Contrast  Result Date: 11/02/2018 CLINICAL DATA:  Postop day 10 RIGHT total knee arthroplasty, presenting with acute onset of chest pain and shortness of breath. Prior CABG. EXAM: CT ANGIOGRAPHY CHEST WITH CONTRAST TECHNIQUE: Multidetector CT imaging of the chest was performed using the standard protocol during bolus administration of intravenous contrast. Multiplanar CT image reconstructions and MIPs were obtained to evaluate the vascular anatomy. CONTRAST:  75mL OMNIPAQUE IOHEXOL 350 MG/ML IV. COMPARISON:  None. FINDINGS: Cardiovascular: Contrast opacification of the pulmonary arteries is very good. No filling defects within either main pulmonary artery or their segmental branches in either lung to suggest pulmonary  embolism. Prior CABG. Heart size upper normal. No pericardial effusion. Mild aortic valvular calcification. Mild to moderate atherosclerosis involving the thoracic and proximal abdominal aorta without evidence of aneurysm. Mediastinum/Nodes: Calcified lymph nodes involving the superior mediastinum ANTERIOR to the trachea. No pathologically enlarged mediastinal, hilar or axillary lymph nodes. No mediastinal masses. Normal-appearing esophagus. Calcified subcentimeter nodules involving the isthmus and the RIGHT LOWER pole of the thyroid gland. Lungs/Pleura: Pulmonary parenchyma clear without localized airspace consolidation, interstitial disease, or parenchymal nodules or masses. Central airways patent without significant bronchial wall thickening. No pleural effusions. No pleural plaques or masses. Upper Abdomen: Minimal atrophy involving the body and tail of the pancreas. Remaining visualized upper abdomen unremarkable for the early arterial phase of enhancement. Musculoskeletal: Mild degenerative disc disease and spondylosis involving the mid and LOWER thoracic spine. Mild compression deformity of the UPPER endplate of T12, likely not acute. No acute findings. Review of the MIP images confirms the above findings. IMPRESSION: 1. No evidence of pulmonary embolism. 2. No acute cardiopulmonary disease. 3. Mild aortic valvular calcification. 4. Calcified subcentimeter nodules involving the isthmus and RIGHT LOWER pole of the thyroid gland and adjacent calcified SUPERIOR mediastinal lymph nodes. Given the adjacent lymph nodes, thyroid ultrasound is recommended in further evaluation. This follows ACR consensus guidelines: Managing Incidental Thyroid Nodules Detected on Imaging: White Paper of the ACR Incidental Thyroid Findings Committee. J Am Coll Radiol 2015; 12:143-150. Aortic Atherosclerosis (ICD10-I70.0). Electronically Signed   By: Hulan Saashomas  Lawrence M.D.   On: 11/02/2018 10:33    ASSESSMENT AND PLAN:   Principal  Problem:   Acute delirium Active Problems:   Acute encephalopathy  1.  Hallucinations with acute encephalopathy.  Likely secondary to pain medications.  Stop pain medications.  Tylenol for pain.  Severe pain can do Toradol. 2.  Recent right knee replacement.  Change pain control to Tylenol and PRN Toradol for right now. 3.  Hypokalemia replace magnesium and potassium 4.  Chest pain.  cardiac enzymes negative , CT chest negative, EKG negative, also has anxiety could be panic attack. Called psych consult. 5.  Thyroid nodules and some superior mediastinal lymph nodes.  Check thyroid function - within range.  Will need outpatient ultrasound of the thyroid. 6.  Type 2 diabetes mellitus put on sliding scale.  Hold Glucophage after CAT scan 7.  Hypertension continue usual medications 8.  Hyperlipidemia unspecified on Lipitor 9.  Anxiety depression continue psychiatric medications- call psych consult. 10.  BPH on Flomax 11.  Obesity with a BMI of 31.93.  Weight loss needed   All the records are reviewed and case discussed with Care Management/Social Workerr. Management plans discussed with the patient, family and they are in agreement.  CODE STATUS: DNR  TOTAL TIME TAKING CARE OF THIS PATIENT: 35 minutes.     POSSIBLE D/C IN 1-2 DAYS, DEPENDING ON CLINICAL CONDITION.   Altamese DillingVaibhavkumar Kayley Zeiders M.D on 11/03/2018   Between 7am to 6pm - Pager - [phone]  After 6pm go to [email or web address] - password [email or web address]  [facility]  Office  [phone]  CC: Primary care physician; Dorothey BasemanBronstein, David, MD  Note: This dictation was prepared with Dragon dictation along with smaller phrase technology. Any transcriptional errors that result from this process are unintentional.

## 2018-11-03 NOTE — Care Management Note (Signed)
Case Management Note  Patient Details  Name: Benedetto Coonsddie H Yutzy MRN: [id number] Date of Birth: 04/05/1956  Subjective/Objective:  Admitted to [facility] under observation status with the diagnosis of acute encephalopathy. Lives with wife, Alvino ChapelJo [phone]). Next appointment with Dr. Terance HartBronstein is scheduled for 11/23/18. [facility], Currently receiving outpatient [facility] at [facility]. Post Total Knee replacement of 10/23/18. No skilled facility. No home oxygen. Rolling walker, bedside commode, and crutches in the home. No recent falls. Decreased falls.  States he still is having issues with his right knee.                  Action/Plan: Will continue to follow for transition of care needs.   Expected Discharge Date:                  Expected Discharge Plan:     In-House Referral:   yes  Discharge planning Services   yes  Post Acute Care Choice:    Choice offered to:     DME Arranged:    DME Agency:     HH Arranged:    HH Agency:     Status of Service:     If discussed at [employer] Length of Stay Meetings, dates discussed:    Additional Comments:  Gwenette GreetBrenda S Daesia Zylka, RN MSN CCM Care Management [phone] 11/03/2018, 11:10 AM

## 2018-11-03 NOTE — Progress Notes (Addendum)
At 1017 pt c/o chest pain 2 out of 10, Pt stated " feels like somebody sitting on my chest". Pt extremely anxious, crying and breathing fast. This writer assisted pt to calm down and to breath deep and slow.  At 1017, NT reported pt verbalized that as anxiety goes down chest pain goes up. VSS. Pt rated chest pain 8 out of 10, feels heaviness. Pt is very anxious.  Nitroglycerin SL given, chest pain down to 7. BP 105/67, HR 94 NSR.  2nd nitro not given. Dr Elisabeth PigeonVachhani notified.  EKG and troponin ordered.  MD verbalized that he will come to see pt.   Chest Pain 5 out of 10 per pt. Anxiety improving.

## 2018-11-03 NOTE — Plan of Care (Signed)
Ativan IV push given for anxiety once, xanax given once with improvement.  Pt and spouse reported that pt has hallucinations intermittently.  Dr Elisabeth PigeonVachhani is aware, psych consult pending.

## 2018-11-03 NOTE — Care Management Obs Status (Signed)
MEDICARE OBSERVATION STATUS NOTIFICATION   Patient Details  Name: [name] MRN: [id number] Date of Birth: 05/15/1956   Medicare Observation Status Notification Given:  Yes    Gwenette GreetBrenda S Nikolai Wilczak, RN 11/03/2018, 11:00 AM

## 2018-11-04 ENCOUNTER — Observation Stay: Payer: PPO

## 2018-11-04 DIAGNOSIS — R079 Chest pain, unspecified: Secondary | ICD-10-CM | POA: Diagnosis not present

## 2018-11-04 DIAGNOSIS — R410 Disorientation, unspecified: Secondary | ICD-10-CM | POA: Diagnosis not present

## 2018-11-04 DIAGNOSIS — R4182 Altered mental status, unspecified: Secondary | ICD-10-CM | POA: Diagnosis not present

## 2018-11-04 DIAGNOSIS — E876 Hypokalemia: Secondary | ICD-10-CM | POA: Diagnosis not present

## 2018-11-04 DIAGNOSIS — G9340 Encephalopathy, unspecified: Secondary | ICD-10-CM | POA: Diagnosis not present

## 2018-11-04 DIAGNOSIS — R443 Hallucinations, unspecified: Secondary | ICD-10-CM | POA: Diagnosis not present

## 2018-11-04 LAB — GLUCOSE, CAPILLARY
GLUCOSE-CAPILLARY: 79 mg/dL (ref 70–99)
Glucose-Capillary: 83 mg/dL (ref 70–99)
Glucose-Capillary: 98 mg/dL (ref 70–99)
Glucose-Capillary: 82 mg/dL (ref 70–99)

## 2018-11-04 MED ORDER — VENLAFAXINE HCL ER 75 MG PO CP24
75.0000 mg | ORAL_CAPSULE | Freq: Every day | ORAL | Status: DC
  Administered 2018-11-05 – 2018-11-07 (×3): 75 mg via ORAL
  Filled 2018-11-04 (×2): qty 1

## 2018-11-04 MED ORDER — RISPERIDONE 1 MG PO TBDP
0.5000 mg | ORAL_TABLET | Freq: Two times a day (BID) | ORAL | Status: DC
  Administered 2018-11-04 – 2018-11-06 (×5): 0.5 mg via ORAL
  Filled 2018-11-04 (×6): qty 0.5

## 2018-11-04 MED ORDER — OLANZAPINE 5 MG PO TBDP
15.0000 mg | ORAL_TABLET | Freq: Every day | ORAL | Status: DC
  Administered 2018-11-04 – 2018-11-06 (×3): 15 mg via ORAL
  Filled 2018-11-04 (×4): qty 1

## 2018-11-04 NOTE — Progress Notes (Signed)
PT Cancellation Note  Patient Details Name: [name] MRN: [id number] DOB: 01/01/1956   Cancelled Treatment:    Reason Eval/Treat Not Completed: Other (comment)   Chart reviewed.  Discussed with wife.  Pt continues with confusion, hallucinations and did not sleep at all last night.  During discussion with wife, pt with erratic movements in bed and while not agitated, seemed unsettled.  She reports he has been bending his knee while moving in bed.  He was able to sit to long sitting in bed and scoot up without assist.  Mobility was deferred at this time for pt and staff safety.  Exercises also deferred as pt has difficulty following directions and is not a quality session at this time.  Will continue as appropriate.   Danielle DessSarah Edsel Shives 11/04/2018, 8:56 AM

## 2018-11-04 NOTE — Progress Notes (Signed)
[employer] - [place] at [facility]   PATIENT NAME: [name]    MR#:  [id number]  DATE OF BIRTH:  01/09/1956  SUBJECTIVE:  CHIEF COMPLAINT:   Chief Complaint  Patient presents with  . Chest Pain   Recent knee surgery. Less confused today.  Wife and mother at bedside Patient is alert and oriented x3.  Chest pain is chronic since his CABG  REVIEW OF SYSTEMS:  CONSTITUTIONAL: No fever, fatigue or weakness.  EYES: No blurred or double vision.  EARS, NOSE, AND THROAT: No tinnitus or ear pain.  RESPIRATORY: No cough, shortness of breath, wheezing or hemoptysis.  CARDIOVASCULAR: No chest pain, orthopnea, edema.  GASTROINTESTINAL: No nausea, vomiting, diarrhea or abdominal pain.  GENITOURINARY: No dysuria, hematuria.  ENDOCRINE: No polyuria, nocturia,  HEMATOLOGY: No anemia, easy bruising or bleeding SKIN: No rash or lesion. MUSCULOSKELETAL: No joint pain or arthritis.   NEUROLOGIC: No tingling, numbness, weakness.  PSYCHIATRY: Has anxiety and hallucinations.   ROS  DRUG ALLERGIES:   Allergies  Allergen Reactions  . Oxycodone Other (See Comments)    Hallucinations    VITALS:  Blood pressure 134/79, pulse 73, temperature (!) 97.4 F (36.3 C), temperature source Oral, resp. rate 20, height 5\' 8"  (1.727 m), weight 95.3 kg, SpO2 99 %.  PHYSICAL EXAMINATION:  GENERAL:  62 y.o.-year-old patient lying in the bed with no acute distress.  EYES: Pupils equal, round, reactive to light and accommodation. No scleral icterus. Extraocular muscles intact.  HEENT: Head atraumatic, normocephalic. Oropharynx and nasopharynx clear.  NECK:  Supple, no jugular venous distention. No thyroid enlargement, no tenderness.  LUNGS: Normal breath sounds bilaterally, no wheezing, rales,rhonchi or crepitation. No use of accessory muscles of respiration.  CARDIOVASCULAR: S1, S2 normal. No murmurs, rubs, or gallops.  ABDOMEN: Soft, nontender, nondistended. Bowel sounds present. No  organomegaly or mass.  EXTREMITIES: No pedal edema, cyanosis, or clubbing. Right knee surgery and stiches present. NEUROLOGIC: Cranial nerves II through XII are intact. Muscle strength 5/5 in all extremities. Sensation intact. Gait not checked.  PSYCHIATRIC: The patient is alert and oriented x 3.  SKIN: No obvious rash, lesion, or ulcer.   Physical Exam LABORATORY PANEL:   CBC Recent Labs  Lab 11/03/18 0333  WBC 6.8  HGB 10.3*  HCT 32.8*  PLT 144*   ------------------------------------------------------------------------------------------------------------------  Chemistries  Recent Labs  Lab 11/02/18 0922 11/03/18 0333  NA 136 138  K 3.2* 3.9  CL 107 108  CO2 16* 22  GLUCOSE 61* 84  BUN 10 9  CREATININE 1.05 1.04  CALCIUM 8.5* 8.3*  AST 39  --   ALT 22  --   ALKPHOS 91  --   BILITOT 3.7*  --    ------------------------------------------------------------------------------------------------------------------  Cardiac Enzymes Recent Labs  Lab 11/03/18 1437 11/03/18 1832  TROPONINI <0.03 <0.03   ------------------------------------------------------------------------------------------------------------------  RADIOLOGY:  Ct Head Wo Contrast  Result Date: 11/04/2018 CLINICAL DATA:  62 year old male with acute encephalopathy, altered mental status and hallucinations EXAM: CT HEAD WITHOUT CONTRAST TECHNIQUE: Contiguous axial images were obtained from the base of the skull through the vertex without intravenous contrast. COMPARISON:  Prior head CT 03/14/2009 FINDINGS: Brain: No evidence of acute infarction, hemorrhage, hydrocephalus, extra-axial collection or mass lesion/mass effect. Mild atrophy and periventricular white matter hypoattenuation which is nonspecific but most consistent with chronic microvascular ischemic white matter disease. Vascular: No hyperdense vessel or unexpected calcification. Skull: Normal. Negative for fracture or focal lesion. Sinuses/Orbits:  No acute finding. Other: None. IMPRESSION: 1. No  acute intracranial abnormality. 2. Mild atrophy and chronic microvascular ischemic white matter disease is similar compared to prior. Electronically Signed   By: Malachy Moan M.D.   On: 11/04/2018 11:50    ASSESSMENT AND PLAN:   Principal Problem:   Acute delirium Active Problems:   Acute encephalopathy  1.  Hallucinations with acute encephalopathy.  Likely secondary to medications.  Stop.  Appreciate psychiatry input.  Improving. 2.  Recent right knee replacement.  Change pain control to Tylenol and PRN Toradol for right now. 3.  Hypokalemia replace magnesium and potassium 4.  Chest pain.  Cardiac enzymes and EKG negative.  Chronic pain since his CABG.  No change. 5.  Thyroid nodules and some superior mediastinal lymph nodes.  Check thyroid function - within range.  Will need outpatient ultrasound of the thyroid. 6.  Type 2 diabetes mellitus put on sliding scale.  Hold Glucophage after CAT scan 7.  Hypertension continue usual medications 8.  Hyperlipidemia unspecified on Lipitor 9.  Anxiety depression continue psychiatric medications- call psych consult. 10.  BPH on Flomax 11.  Obesity with a BMI of 31.93.  Weight loss needed   All the records are reviewed and case discussed with Care Management/Social Worker Management plans discussed with the patient, family and they are in agreement.  CODE STATUS: DNR  TOTAL TIME TAKING CARE OF THIS PATIENT: 35 minutes.   Likely discharge home tomorrow  Orie Fisherman M.D on 11/04/2018   Between 7am to 6pm - Pager - [phone]  After 6pm go to [email or web address] - password [email or web address]  [facility] [place] [facility]  Office  [phone]  CC: Primary care physician; Dorothey Baseman, MD  Note: This dictation was prepared with Dragon dictation along with smaller phrase technology. Any transcriptional errors that result from this process are unintentional.

## 2018-11-04 NOTE — Consult Note (Signed)
[name] Face-to-Face Psychiatry Consult   Reason for Consult: Consult follow-up for this patient who continues to have delirium Referring Physician: Sudini Patient Identification: [name] MRN:  [id number] Principal Diagnosis: Acute delirium Diagnosis:  Principal Problem:   Acute delirium Active Problems:   Acute encephalopathy   Total Time spent with patient: 30 minutes  Subjective:   [name] is a 62 y.o. male patient admitted with "I still see things".  HPI: Patient seen chart reviewed.  Spoke with his family who are present especially his wife.  On interview today the patient presented to me as being more put together than yesterday.  He knew he was in the hospital.  He was able to give me an explanation for why he was there although he got a little tangential with it.  He knew the correct year.  He did say that he had still had visual hallucinations during the day today.  He was not agitated and did not appear to be hyper religious like he was yesterday.  Wife tells me however that throughout the day he has still had lots of spells of confusion.  Last night apparently was very bad with lots of agitation trying to get out of bed constant hallucinations.  CT scan was done this morning and was unremarkable.  No other clear new medical problem.  Past Psychiatric History: Patient was on Wellbutrin and venlafaxine before admission.  Not clear that he has had specific psychiatric treatment in the past.  Does not appear to be having any symptoms of depression.  Prior history of confusion from narcotics.  No substance abuse.  Risk to Self:   Risk to Others:   Prior Inpatient Therapy:   Prior Outpatient Therapy:    Past Medical History:  Past Medical History:  Diagnosis Date  . Anxiety   . Atherosclerosis of coronary artery bypass graft with angina pectoris (HCC)   . Barrett's esophagus   . Benign enlargement of prostate   . Chronic constipation   . Colon polyp   . Coronary  artery disease   . Depression   . GERD (gastroesophageal reflux disease)   . H/O esophageal spasm   . Hyperlipidemia   . Hypertension   . MI (myocardial infarction) (HCC) 2011 & 2014   x 2  . Sleep apnea     after weight loss no need for CPAP  . Type 2 diabetes mellitus (HCC)     Past Surgical History:  Procedure Laterality Date  . abscess cellulitis resection     . ANAL FISSURE REPAIR    . CARDIAC CATHETERIZATION  2011  . COLONOSCOPY    . CORONARY ANGIOPLASTY WITH STENT PLACEMENT  2011   stent placement x 3 @ [facility]; Dr. Darrold Junker  . CORONARY ARTERY BYPASS GRAFT  2014   CABG x 2  . EXCISION MASS NECK  1985   faliculitis, incision became infected and was in hospital for 21 days  . MOUTH SURGERY  2019  . PARTIAL KNEE ARTHROPLASTY Right 07/19/2017   Procedure: UNICOMPARTMENTAL KNEE;  Surgeon: Christena Flake, MD;  Location: [facility] ORS;  Service: Orthopedics;  Laterality: Right;  . TEE WITH CARDIOVERSION    . TOTAL KNEE ARTHROPLASTY Right 10/23/2018   Procedure: CONVERSION OF PARTIAL TO TOTAL KNEE ARTHROPLASTY;  Surgeon: Christena Flake, MD;  Location: [facility] ORS;  Service: Orthopedics;  Laterality: Right;   Family History:  Family History  Problem Relation Age of Onset  . Hypertension Mother   . Hyperlipidemia  Mother   . Hypertension Father   . Hyperlipidemia Father    Family Psychiatric  History: See previous note Social History:  Social History   Substance and Sexual Activity  Alcohol Use No     Social History   Substance and Sexual Activity  Drug Use No    Social History   Socioeconomic History  . Marital status: Married    Spouse name: Not on file  . Number of children: Not on file  . Years of education: Not on file  . Highest education level: Not on file  Occupational History  . Not on file  Social Needs  . Financial resource strain: Not on file  . Food insecurity:    Worry: Not on file    Inability: Not on file  . Transportation needs:    Medical: Not on  file    Non-medical: Not on file  Tobacco Use  . Smoking status: Never Smoker  . Smokeless tobacco: Never Used  Substance and Sexual Activity  . Alcohol use: No  . Drug use: No  . Sexual activity: Not Currently  Lifestyle  . Physical activity:    Days per week: Not on file    Minutes per session: Not on file  . Stress: Not on file  Relationships  . Social connections:    Talks on phone: Not on file    Gets together: Not on file    Attends religious service: Not on file    Active member of club or organization: Not on file    Attends meetings of clubs or organizations: Not on file    Relationship status: Not on file  Other Topics Concern  . Not on file  Social History Narrative  . Not on file   Additional Social History:    Allergies:   Allergies  Allergen Reactions  . Oxycodone Other (See Comments)    Hallucinations    Labs:  Results for orders placed or performed during the hospital encounter of 11/02/18 (from the past 48 hour(s))  Glucose, capillary     Status: Abnormal   Collection Time: 11/02/18  4:34 PM  Result Value Ref Range   Glucose-Capillary 67 (L) 70 - 99 mg/dL  Glucose, capillary     Status: Abnormal   Collection Time: 11/02/18  5:09 PM  Result Value Ref Range   Glucose-Capillary 111 (H) 70 - 99 mg/dL  Glucose, capillary     Status: None   Collection Time: 11/02/18  8:45 PM  Result Value Ref Range   Glucose-Capillary 99 70 - 99 mg/dL  Basic metabolic panel     Status: Abnormal   Collection Time: 11/03/18  3:33 AM  Result Value Ref Range   Sodium 138 135 - 145 mmol/L   Potassium 3.9 3.5 - 5.1 mmol/L   Chloride 108 98 - 111 mmol/L   CO2 22 22 - 32 mmol/L   Glucose, Bld 84 70 - 99 mg/dL   BUN 9 8 - 23 mg/dL   Creatinine, Ser 3.08 0.61 - 1.24 mg/dL   Calcium 8.3 (L) 8.9 - 10.3 mg/dL   GFR calc non Af Amer >60 >60 mL/min   GFR calc Af Amer >60 >60 mL/min   Anion gap 8 5 - 15    Comment: Performed at [facility], [address]  [place], [place] [address]  CBC     Status: Abnormal   Collection Time: 11/03/18  3:33 AM  Result Value Ref Range   WBC 6.8  4.0 - 10.5 K/uL   RBC 3.87 (L) 4.22 - 5.81 MIL/uL   Hemoglobin 10.3 (L) 13.0 - 17.0 g/dL   HCT 82.932.8 (L) 56.239.0 - 13.052.0 %   MCV 84.8 80.0 - 100.0 fL   MCH 26.6 26.0 - 34.0 pg   MCHC 31.4 30.0 - 36.0 g/dL   RDW 86.515.0 78.411.5 - 69.615.5 %   Platelets 144 (L) 150 - 400 K/uL   nRBC 0.0 0.0 - 0.2 %    Comment: Performed at [facility], [address] [place], [place] [address]  TSH     Status: None   Collection Time: 11/03/18  3:33 AM  Result Value Ref Range   TSH 1.981 0.350 - 4.500 uIU/mL    Comment: Performed by a 3rd Generation assay with a functional sensitivity of <=0.01 uIU/mL. Performed at [facility], [address] [place], [place] [address]   T4, free     Status: None   Collection Time: 11/03/18  3:33 AM  Result Value Ref Range   Free T4 0.90 0.82 - 1.77 ng/dL    Comment: (NOTE) Biotin ingestion may interfere with free T4 tests. If the results are inconsistent with the TSH level, previous test results, or the clinical presentation, then consider biotin interference. If needed, order repeat testing after stopping biotin. Performed at [facility], [address] [place], [place] [address]   Glucose, capillary     Status: None   Collection Time: 11/03/18  7:34 AM  Result Value Ref Range   Glucose-Capillary 71 70 - 99 mg/dL  Troponin I - STAT Now Then Q4H     Status: None   Collection Time: 11/03/18 11:30 AM  Result Value Ref Range   Troponin I <0.03 <0.03 ng/mL    Comment: Performed at [facility], [address] [place], [place] [address]  Glucose, capillary     Status: Abnormal   Collection Time: 11/03/18 11:42 AM  Result Value Ref Range   Glucose-Capillary 64 (L) 70 - 99 mg/dL  Glucose, capillary     Status: None   Collection Time: 11/03/18 11:56 AM  Result Value Ref Range   Glucose-Capillary 74 70 - 99 mg/dL    Troponin I - STAT Now Then Q4H     Status: None   Collection Time: 11/03/18  2:37 PM  Result Value Ref Range   Troponin I <0.03 <0.03 ng/mL    Comment: Performed at [facility], [address] [place], [place] [address]  Glucose, capillary     Status: None   Collection Time: 11/03/18  4:55 PM  Result Value Ref Range   Glucose-Capillary 70 70 - 99 mg/dL  Troponin I - STAT Now Then Q4H     Status: None   Collection Time: 11/03/18  6:32 PM  Result Value Ref Range   Troponin I <0.03 <0.03 ng/mL    Comment: Performed at [facility], [address] [place], [place] [address]  Glucose, capillary     Status: Abnormal   Collection Time: 11/03/18 10:54 PM  Result Value Ref Range   Glucose-Capillary 69 (L) 70 - 99 mg/dL  Glucose, capillary     Status: None   Collection Time: 11/03/18 11:05 PM  Result Value Ref Range   Glucose-Capillary 80 70 - 99 mg/dL  Glucose, capillary     Status: None   Collection Time: 11/04/18  7:31 AM  Result Value Ref Range   Glucose-Capillary 79 70 - 99 mg/dL  Glucose, capillary     Status: None   Collection Time: 11/04/18 11:44 AM  Result Value Ref Range   Glucose-Capillary 83 70 - 99 mg/dL    Current Facility-Administered Medications  Medication Dose Route Frequency Provider Last Rate Last Dose  . 0.9 %  sodium chloride infusion   Intravenous PRN Alford Highland, MD   Stopped at 11/02/18 1815  . acetaminophen (TYLENOL) tablet 650 mg  650 mg Oral Q6H PRN Wieting, Richard, MD      . albuterol (PROVENTIL) (2.5 MG/3ML) 0.083% nebulizer solution 2.5 mg  2.5 mg Inhalation Q6H PRN Wieting, Richard, MD      . aspirin EC tablet 81 mg  81 mg Oral Daily Alford Highland, MD   81 mg at 11/04/18 0914  . atorvastatin (LIPITOR) tablet 40 mg  40 mg Oral QHS Alford Highland, MD   40 mg at 11/03/18 2103  . enoxaparin (LOVENOX) injection 40 mg  40 mg Subcutaneous Q24H Alford Highland, MD   40 mg at 11/04/18 0913  . ezetimibe (ZETIA) tablet 10 mg  10  mg Oral Daily Alford Highland, MD   10 mg at 11/04/18 0912  . ketorolac (TORADOL) 15 MG/ML injection 15 mg  15 mg Intravenous Q6H PRN Wieting, Richard, MD      . loratadine (CLARITIN) tablet 10 mg  10 mg Oral QHS Alford Highland, MD   10 mg at 11/03/18 2103  . midodrine (PROAMATINE) tablet 10 mg  10 mg Oral TID PC Wieting, Richard, MD   10 mg at 11/04/18 1412  . mometasone-formoterol (DULERA) 200-5 MCG/ACT inhaler 2 puff  2 puff Inhalation BID Alford Highland, MD   2 puff at 11/04/18 0914  . nitroGLYCERIN (NITROSTAT) SL tablet 0.4 mg  0.4 mg Sublingual Q5 min PRN Alford Highland, MD   0.4 mg at 11/03/18 1017  . OLANZapine zydis (ZYPREXA) disintegrating tablet 15 mg  15 mg Oral QHS Clapacs, John T, MD      . ondansetron (ZOFRAN) tablet 4 mg  4 mg Oral Q6H PRN Wieting, Richard, MD       Or  . ondansetron (ZOFRAN) injection 4 mg  4 mg Intravenous Q6H PRN Wieting, Richard, MD      . pantoprazole (PROTONIX) EC tablet 40 mg  40 mg Oral QHS Alford Highland, MD   40 mg at 11/03/18 2103  . risperiDONE (RISPERDAL M-TABS) disintegrating tablet 0.5 mg  0.5 mg Oral BID Clapacs, John T, MD      . tamsulosin (FLOMAX) capsule 0.4 mg  0.4 mg Oral QHS Alford Highland, MD   0.4 mg at 11/03/18 2103  . [START ON 11/05/2018] venlafaxine XR (EFFEXOR-XR) 24 hr capsule 75 mg  75 mg Oral Daily Clapacs, Jackquline Denmark, MD        Musculoskeletal: Strength & Muscle Tone: within normal limits Gait & Station: unable to stand Patient leans: N/A  Psychiatric Specialty Exam: Physical Exam  Nursing note and vitals reviewed. Constitutional: He appears well-developed and well-nourished.  HENT:  Head: Normocephalic and atraumatic.  Eyes: Pupils are equal, round, and reactive to light. Conjunctivae are normal.  Neck: Normal range of motion.  Cardiovascular: Regular rhythm and normal heart sounds.  Respiratory: Effort normal. No respiratory distress.  GI: Soft.  Musculoskeletal: Normal range of motion.        Legs:  Neurological: He is alert.  Skin: Skin is warm and dry.  Psychiatric: His affect is labile. His speech is tangential. He is agitated. He is not aggressive. Thought content is  not paranoid. Cognition and memory are impaired. He expresses inappropriate judgment.    Review of Systems  Constitutional: Negative.   HENT: Negative.   Eyes: Negative.   Respiratory: Negative.   Cardiovascular: Negative.   Gastrointestinal: Negative.   Musculoskeletal: Negative.   Skin: Negative.   Neurological: Negative.   Psychiatric/Behavioral: Positive for hallucinations. Negative for depression, memory loss, substance abuse and suicidal ideas. The patient is nervous/anxious and has insomnia.     Blood pressure 134/79, pulse 73, temperature (!) 97.4 F (36.3 C), temperature source Oral, resp. rate 20, height 5\' 8"  (1.727 m), weight 95.3 kg, SpO2 99 %.Body mass index is 31.93 kg/m.  General Appearance: Casual  Eye Contact:  Good  Speech:  Normal Rate  Volume:  Normal  Mood:  Euthymic  Affect:  Constricted  Thought Process:  Disorganized  Orientation:  Full (Time, Place, and Person)  Thought Content:  Logical and Rumination  Suicidal Thoughts:  No  Homicidal Thoughts:  No  Memory:  Immediate;   Fair Recent;   Fair Remote;   Fair  Judgement:  Fair  Insight:  Fair  Psychomotor Activity:  Decreased  Concentration:  Concentration: Poor  Recall:  [employer] of Knowledge:  Fair  Language:  Fair  Akathisia:  No  Handed:  Right  AIMS (if indicated):     Assets:  Desire for Improvement Housing Resilience Social Support  ADL's:  Impaired  Cognition:  Impaired,  Mild  Sleep:        Treatment Plan Summary: Daily contact with patient to assess and evaluate symptoms and progress in treatment, Medication management and Plan Patient when I saw him this afternoon seemed to be doing better but according to the wife and family there have been ongoing episodes of intermittent confusion and delirium  throughout the day and he was particularly bad last night despite getting the olanzapine.  Wife is also complaining that the patient seems to have more of a tremor and has been unable to feed himself because of it and seems to be more uncoordinated.  Patient did look a little tremulous to me.  Not likely to be related to antipsychotics since it sounds like the only one he got was the olanzapine last night.  Family very much would like to see him get the delirium under better control.  I am going to make a few changes.  Increase nighttime olanzapine to 15.  Change Risperdal to standing doses twice a day.  Discontinue the Wellbutrin which is over stimulating in itself can cause hallucinations.  Cut down the Effexor dose which also may contribute a little bit.  I will continue to follow up as needed.  Disposition: No evidence of imminent risk to self or others at present.   Patient does not meet criteria for psychiatric inpatient admission. Supportive therapy provided about ongoing stressors. Discussed crisis plan, support from social network, calling 911, coming to the Emergency Department, and calling Suicide Hotline.  Mordecai Rasmussen, MD 11/04/2018 4:06 PM

## 2018-11-05 DIAGNOSIS — R079 Chest pain, unspecified: Secondary | ICD-10-CM | POA: Diagnosis not present

## 2018-11-05 DIAGNOSIS — R443 Hallucinations, unspecified: Secondary | ICD-10-CM | POA: Diagnosis not present

## 2018-11-05 DIAGNOSIS — R410 Disorientation, unspecified: Secondary | ICD-10-CM | POA: Diagnosis not present

## 2018-11-05 DIAGNOSIS — G9340 Encephalopathy, unspecified: Secondary | ICD-10-CM | POA: Diagnosis not present

## 2018-11-05 DIAGNOSIS — E876 Hypokalemia: Secondary | ICD-10-CM | POA: Diagnosis not present

## 2018-11-05 LAB — GLUCOSE, CAPILLARY
Glucose-Capillary: 98 mg/dL (ref 70–99)
Glucose-Capillary: 118 mg/dL — ABNORMAL HIGH (ref 70–99)

## 2018-11-05 MED ORDER — OLANZAPINE 15 MG PO TBDP: 15.0000 mg | ORAL_TABLET | Freq: Every day | ORAL | 0 refills | Status: DC

## 2018-11-05 MED ORDER — RISPERIDONE 0.5 MG PO TBDP: 0.5000 mg | ORAL_TABLET | Freq: Two times a day (BID) | ORAL | 0 refills | Status: DC

## 2018-11-05 NOTE — Discharge Instructions (Signed)
Resume diet and activity as before ° ° °

## 2018-11-05 NOTE — Consult Note (Signed)
[facility]   Reason for Consult: Follow-up consult for patient having delirium in the hospital Referring Physician: Sudini Patient Identification: [name] MRN:  [id number] Principal Diagnosis: Acute delirium Diagnosis:  Principal Problem:   Acute delirium Active Problems:   Acute encephalopathy   Total Time spent with patient: 15 minutes  Subjective:   [name] is a 62 y.o. male patient admitted with "my knee".  HPI: Patient seen.  Wife seen.  Patient evidently slept well last night.  Has not been agitated since starting antipsychotics.  Delusions and confusion greatly decreased.  Wife states the patient seems to be pretty much back to himself.  No clear side effects from medicine.  Case reviewed with hospitalist.  Patient ready for discharge.  Past Psychiatric History: The previous notes  Risk to Self:   Risk to Others:   Prior Inpatient Therapy:   Prior Outpatient Therapy:    Past Medical History:  Past Medical History:  Diagnosis Date  . Anxiety   . Atherosclerosis of coronary artery bypass graft with angina pectoris (HCC)   . Barrett's esophagus   . Benign enlargement of prostate   . Chronic constipation   . Colon polyp   . Coronary artery disease   . Depression   . GERD (gastroesophageal reflux disease)   . H/O esophageal spasm   . Hyperlipidemia   . Hypertension   . MI (myocardial infarction) (HCC) 2011 & 2014   x 2  . Sleep apnea     after weight loss no need for CPAP  . Type 2 diabetes mellitus (HCC)     Past Surgical History:  Procedure Laterality Date  . abscess cellulitis resection     . ANAL FISSURE REPAIR    . CARDIAC CATHETERIZATION  2011  . COLONOSCOPY    . CORONARY ANGIOPLASTY WITH STENT PLACEMENT  2011   stent placement x 3 @ [facility]; Dr. Darrold JunkerParaschos  . CORONARY ARTERY BYPASS GRAFT  2014   CABG x 2  . EXCISION MASS NECK  1985   faliculitis, incision became infected and was in hospital for 21 days  . MOUTH  SURGERY  2019  . PARTIAL KNEE ARTHROPLASTY Right 07/19/2017   Procedure: UNICOMPARTMENTAL KNEE;  Surgeon: Christena FlakePoggi, Sharyn Brilliant J, MD;  Location: [facility] ORS;  Service: Orthopedics;  Laterality: Right;  . TEE WITH CARDIOVERSION    . TOTAL KNEE ARTHROPLASTY Right 10/23/2018   Procedure: CONVERSION OF PARTIAL TO TOTAL KNEE ARTHROPLASTY;  Surgeon: Christena FlakePoggi, Trent Theisen J, MD;  Location: [facility] ORS;  Service: Orthopedics;  Laterality: Right;   Family History:  Family History  Problem Relation Age of Onset  . Hypertension Mother   . Hyperlipidemia Mother   . Hypertension Father   . Hyperlipidemia Father    Family Psychiatric  History: The previous notes Social History:  Social History   Substance and Sexual Activity  Alcohol Use No     Social History   Substance and Sexual Activity  Drug Use No    Social History   Socioeconomic History  . Marital status: Married    Spouse name: Not on file  . Number of children: Not on file  . Years of education: Not on file  . Highest education level: Not on file  Occupational History  . Not on file  Social Needs  . Financial resource strain: Not on file  . Food insecurity:    Worry: Not on file    Inability: Not on file  . Transportation needs:  Medical: Not on file    Non-medical: Not on file  Tobacco Use  . Smoking status: Never Smoker  . Smokeless tobacco: Never Used  Substance and Sexual Activity  . Alcohol use: No  . Drug use: No  . Sexual activity: Not Currently  Lifestyle  . Physical activity:    Days per week: Not on file    Minutes per session: Not on file  . Stress: Not on file  Relationships  . Social connections:    Talks on phone: Not on file    Gets together: Not on file    Attends religious service: Not on file    Active member of club or organization: Not on file    Attends meetings of clubs or organizations: Not on file    Relationship status: Not on file  Other Topics Concern  . Not on file  Social History Narrative  . Not on  file   Additional Social History:    Allergies:   Allergies  Allergen Reactions  . Oxycodone Other (See Comments)    Hallucinations    Labs:  Results for orders placed or performed during the hospital encounter of 11/02/18 (from the past 48 hour(s))  Troponin I - STAT Now Then Q4H     Status: None   Collection Time: 11/03/18  2:37 PM  Result Value Ref Range   Troponin I <0.03 <0.03 ng/mL    Comment: Performed at [facility] [address] [place], [place] [address]  Glucose, capillary     Status: None   Collection Time: 11/03/18  4:55 PM  Result Value Ref Range   Glucose-Capillary 70 70 - 99 mg/dL  Troponin I - STAT Now Then Q4H     Status: None   Collection Time: 11/03/18  6:32 PM  Result Value Ref Range   Troponin I <0.03 <0.03 ng/mL    Comment: Performed at [facility], [address] [place], [place] [address]  Glucose, capillary     Status: Abnormal   Collection Time: 11/03/18 10:54 PM  Result Value Ref Range   Glucose-Capillary 69 (L) 70 - 99 mg/dL  Glucose, capillary     Status: None   Collection Time: 11/03/18 11:05 PM  Result Value Ref Range   Glucose-Capillary 80 70 - 99 mg/dL  Glucose, capillary     Status: None   Collection Time: 11/04/18  7:31 AM  Result Value Ref Range   Glucose-Capillary 79 70 - 99 mg/dL  Glucose, capillary     Status: None   Collection Time: 11/04/18 11:44 AM  Result Value Ref Range   Glucose-Capillary 83 70 - 99 mg/dL  Glucose, capillary     Status: None   Collection Time: 11/04/18  4:59 PM  Result Value Ref Range   Glucose-Capillary 98 70 - 99 mg/dL  Glucose, capillary     Status: None   Collection Time: 11/04/18  9:21 PM  Result Value Ref Range   Glucose-Capillary 82 70 - 99 mg/dL  Glucose, capillary     Status: None   Collection Time: 11/05/18  7:34 AM  Result Value Ref Range   Glucose-Capillary 98 70 - 99 mg/dL   Comment 1 Notify RN   Glucose, capillary     Status: Abnormal   Collection Time:  11/05/18 12:03 PM  Result Value Ref Range   Glucose-Capillary 118 (H) 70 - 99 mg/dL   Comment 1 Notify RN     Current Facility-Administered Medications  Medication Dose Route  Frequency Provider Last Rate Last Dose  . 0.9 %  sodium chloride infusion   Intravenous PRN Alford Highland, MD   Stopped at 11/02/18 1815  . acetaminophen (TYLENOL) tablet 650 mg  650 mg Oral Q6H PRN Alford Highland, MD   650 mg at 11/04/18 2126  . albuterol (PROVENTIL) (2.5 MG/3ML) 0.083% nebulizer solution 2.5 mg  2.5 mg Inhalation Q6H PRN Alford Highland, MD      . aspirin EC tablet 81 mg  81 mg Oral Daily Alford Highland, MD   81 mg at 11/05/18 0902  . atorvastatin (LIPITOR) tablet 40 mg  40 mg Oral QHS Alford Highland, MD   40 mg at 11/04/18 2125  . enoxaparin (LOVENOX) injection 40 mg  40 mg Subcutaneous Q24H Alford Highland, MD   40 mg at 11/05/18 0902  . ezetimibe (ZETIA) tablet 10 mg  10 mg Oral Daily Alford Highland, MD   10 mg at 11/05/18 0902  . ketorolac (TORADOL) 15 MG/ML injection 15 mg  15 mg Intravenous Q6H PRN Wieting, Richard, MD      . loratadine (CLARITIN) tablet 10 mg  10 mg Oral QHS Alford Highland, MD   10 mg at 11/04/18 2126  . midodrine (PROAMATINE) tablet 10 mg  10 mg Oral TID PC Alford Highland, MD   10 mg at 11/05/18 0902  . mometasone-formoterol (DULERA) 200-5 MCG/ACT inhaler 2 puff  2 puff Inhalation BID Alford Highland, MD   2 puff at 11/05/18 [phone]  . nitroGLYCERIN (NITROSTAT) SL tablet 0.4 mg  0.4 mg Sublingual Q5 min PRN Alford Highland, MD   0.4 mg at 11/03/18 1017  . OLANZapine zydis (ZYPREXA) disintegrating tablet 15 mg  15 mg Oral QHS Timmey Lamba, Jackquline Denmark, MD   15 mg at 11/04/18 2126  . ondansetron (ZOFRAN) tablet 4 mg  4 mg Oral Q6H PRN Alford Highland, MD       Or  . ondansetron (ZOFRAN) injection 4 mg  4 mg Intravenous Q6H PRN Wieting, Richard, MD      . pantoprazole (PROTONIX) EC tablet 40 mg  40 mg Oral QHS Alford Highland, MD   40 mg at 11/04/18 2126  . risperiDONE  (RISPERDAL M-TABS) disintegrating tablet 0.5 mg  0.5 mg Oral BID Maleeyah Mccaughey, Jackquline Denmark, MD   0.5 mg at 11/05/18 0903  . tamsulosin (FLOMAX) capsule 0.4 mg  0.4 mg Oral QHS Alford Highland, MD   0.4 mg at 11/04/18 2125  . venlafaxine XR (EFFEXOR-XR) 24 hr capsule 75 mg  75 mg Oral Daily Audie Stayer, Jackquline Denmark, MD   75 mg at 11/05/18 0902    Musculoskeletal: Strength & Muscle Tone: decreased Gait & Station: unsteady Patient leans: N/A  Psychiatric Specialty Exam: Physical Exam  Nursing note and vitals reviewed. Constitutional: He appears well-developed and well-nourished.  HENT:  Head: Normocephalic and atraumatic.  Eyes: Pupils are equal, round, and reactive to light. Conjunctivae are normal.  Neck: Normal range of motion.  Cardiovascular: Regular rhythm and normal heart sounds.  Respiratory: Effort normal.  GI: Soft.  Musculoskeletal: Normal range of motion.  Neurological: He is alert.  Skin: Skin is warm and dry.  Psychiatric: He has a normal mood and affect. His behavior is normal. Judgment and thought content normal.    Review of Systems  Constitutional: Negative.   HENT: Negative.   Eyes: Negative.   Respiratory: Negative.   Cardiovascular: Negative.   Gastrointestinal: Negative.   Musculoskeletal: Negative.   Skin: Negative.   Neurological: Negative.   Psychiatric/Behavioral: Negative.  Blood pressure 109/67, pulse 68, temperature 97.7 F (36.5 C), temperature source Oral, resp. rate 18, height 5\' 8"  (1.727 m), weight 95.3 kg, SpO2 98 %.Body mass index is 31.93 kg/m.  General Appearance: Fairly Groomed  Eye Contact:  Fair  Speech:  Clear and Coherent  Volume:  Decreased  Mood:  Euthymic  Affect:  Congruent  Thought Process:  Goal Directed  Orientation:  Full (Time, Place, and Person)  Thought Content:  Logical  Suicidal Thoughts:  No  Homicidal Thoughts:  No  Memory:  Immediate;   Fair Recent;   Fair Remote;   Fair  Judgement:  Fair  Insight:  Fair  Psychomotor  Activity:  Normal  Concentration:  Concentration: Fair  Recall:  [employer] of Knowledge:  Fair  Language:  Fair  Akathisia:  No  Handed:  Right  AIMS (if indicated):     Assets:  Architect Housing  ADL's:  Intact  Cognition:  Impaired,  Mild  Sleep:        Treatment Plan Summary: Daily contact with patient to assess and evaluate symptoms and progress in treatment, Medication management and Plan My suggestion would be continuing the Zyprexa and Risperdal at these low doses for 1-2 nights and then discontinuing.  If there is any concern I would discontinue the Risperdal first and then gradually discontinue the nighttime olanzapine but I would predict that once he gets home he should return to baseline and less there is a new stimulus causing delirium and should not need antipsychotics long-term.  Case reviewed with wife patient and hospitalist.  Disposition: No evidence of imminent risk to self or others at present.    Mordecai Rasmussen, MD 11/05/2018 1:11 PM

## 2018-11-05 NOTE — Progress Notes (Addendum)
Physical Therapy Treatment Patient Details Name: [name] H Corvino MRN: [id number] DOB: 01/14/1956 Today's Date: 11/05/2018    History of Present Illness presented to ER secondary to AMS, chest pain/SOB, hallucinations; admitted with acute encephalopathy, likely due to pain medication.  Of note, patient with recent R TKR (12/2); has been home with outpatient PT services    PT Comments    Pt received in bed, wife in room. Pt has no memory of yesterday, but both report pt is much improved today without any hallucination. Pt initially orthostatic with supine to sit, which improves in time, then orthostatic again sit to stand which improves with second attempt. First standing attempt with trunk shaking/jerking, non in limbs, looks similar to shivering, but pt denies. Pt/wife reports this phenomenon intermittently since TKA on 12/2. This again happens with AMB progressively worsening with distance. Motor planning in standing and AMB is extension dominant, jerky, shaking, with frequent LOB, sometimes lifting the RW from the floor completely, requires modA 3x to prevent fall to floor. Severe dyspnea AMB in hall, which resolves quickly. Pt unsafe to attempt steps, but at home has multiple at entry, without railings, previously using axillary crutches to perform which would not be wise to attempt at this time. Terminal BP quite elevated. Pt returned to bed and RN/MD notified. Presentation in motor planning is similar this date to prior session. Wife/Pt report similar phenomenon PTA but non witness by care providers other than PT, hence communicating these has been a challenge.   Position  BP  HR Other  supine 128/69 80   sitting 0 min 119/83 99   sitting 2 min 133/78 79   standing attempt 1 110/78 96  trunk shaking, increased in time  *60sec seated break     standing attempt 2 138/77 91 no shaking this time  standing s/p AMB  187/92 99 shaking     Follow Up Recommendations  SNF     Equipment  Recommendations  None recommended by PT    Recommendations for Other Services       Precautions / Restrictions Precautions Precautions: Fall Restrictions RLE Weight Bearing: Weight bearing as tolerated    Mobility  Bed Mobility Overal bed mobility: Needs Assistance Bed Mobility: Supine to Sit;Sit to Supine     Supine to sit: Supervision Sit to supine: Supervision      Transfers Overall transfer level: Needs assistance Equipment used: Rolling walker (2 wheeled) Transfers: Sit to/from Stand Sit to Stand: Min guard         General transfer comment: continues to have increased shaking with transition to standing, correlats with BP drop, second attempt normostatic without shaking.   Ambulation/Gait Ambulation/Gait assistance: Mod assist Gait Distance (Feet): 140 Feet Assistive device: Rolling walker (2 wheeled)   Gait velocity: 0.6851m/s   General Gait Details: ataxic, jerky movement, worsens with distance, gross motor planning deteroirates with exersion as does dyspnea; terminal BP 187/92. DOE resolves without; nearly wipes out 3x during turning, lifting walker of fthe ground.    Stairs Stairs: (unsafe to attempt at this time. )           Wheelchair Mobility    Modified Rankin (Stroke Patients Only)       Balance             Standing balance-Leahy Scale: Poor Standing balance comment: substantial LOB 3x, lifting walker from floor, worse during turns. s  Cognition Arousal/Alertness: Awake/alert Behavior During Therapy: WFL for tasks assessed/performed;Anxious(mildly anxious at baseline, significantly worse with DOE, but resolves ) Overall Cognitive Status: Within Functional Limits for tasks assessed                                        Exercises      General Comments        Pertinent Vitals/Pain Pain Assessment: No/denies pain    Home Living                      Prior  Function            PT Goals (current goals can now be found in the care plan section) Acute Rehab PT Goals Patient Stated Goal: return home PT Goal Formulation: With patient/family Time For Goal Achievement: 11/16/18 Potential to Achieve Goals: Good Progress towards PT goals: Not progressing toward goals - comment    Frequency    7X/week      PT Plan Current plan remains appropriate    Co-evaluation              AM-PAC PT "6 Clicks" Mobility   Outcome Measure  Help needed turning from your back to your side while in a flat bed without using bedrails?: A Little Help needed moving from lying on your back to sitting on the side of a flat bed without using bedrails?: A Little Help needed moving to and from a bed to a chair (including a wheelchair)?: A Lot Help needed standing up from a chair using your arms (e.g., wheelchair or bedside chair)?: A Lot Help needed to walk in hospital room?: Total Help needed climbing 3-5 steps with a railing? : Total 6 Click Score: 12    End of Session Equipment Utilized During Treatment: Gait belt Activity Tolerance: Other (comment) Patient left: in bed;with bed alarm set;with family/visitor present Nurse Communication: Mobility status PT Visit Diagnosis: Muscle weakness (generalized) (M62.81);Difficulty in walking, not elsewhere classified (R26.2)     Time: 1610-9604 PT Time Calculation (min) (ACUTE ONLY): 38 min  Charges:  $Therapeutic Activity: 23-37 mins                     3:33 PM, 11/05/18 Rosamaria Lints, PT, DPT Physical Therapist - [facility]  [phone] (ASCOM)     , C 11/05/2018, 3:27 PM

## 2018-11-05 NOTE — Progress Notes (Addendum)
VItals Established during PT treatment session:   Position  BP  HR Other  supine 128/69 80   sitting 0 min 119/83 99   sitting 2 min 133/78 79   standing attempt 1 110/78 96  trunk shaking, increased in time  *60sec seated break     standing attempt 2 138/77 91 no shaking this time  standing s/p AMB  187/92 99 shaking   Pt having progressive trunk shaking, linear increase with sustained activity, anxious DOE p 6370ft AMB c RW, then returned to room.   3:08 PM, 11/05/18 Rosamaria LintsAllan C Zachary Nole, PT, DPT Physical Therapist - [facility]  [phone] [facility](ASCOM)

## 2018-11-06 ENCOUNTER — Observation Stay: Payer: PPO

## 2018-11-06 DIAGNOSIS — E876 Hypokalemia: Secondary | ICD-10-CM | POA: Diagnosis not present

## 2018-11-06 DIAGNOSIS — G9340 Encephalopathy, unspecified: Secondary | ICD-10-CM | POA: Diagnosis not present

## 2018-11-06 DIAGNOSIS — R079 Chest pain, unspecified: Secondary | ICD-10-CM | POA: Diagnosis not present

## 2018-11-06 DIAGNOSIS — R443 Hallucinations, unspecified: Secondary | ICD-10-CM | POA: Diagnosis not present

## 2018-11-06 LAB — CULTURE, BLOOD (ROUTINE X 2)
Culture: NO GROWTH
Culture: NO GROWTH
Special Requests: ADEQUATE

## 2018-11-06 LAB — GLUCOSE, CAPILLARY
Glucose-Capillary: 86 mg/dL (ref 70–99)
Glucose-Capillary: 77 mg/dL (ref 70–99)
Glucose-Capillary: 98 mg/dL (ref 70–99)
Glucose-Capillary: 117 mg/dL — ABNORMAL HIGH (ref 70–99)
Glucose-Capillary: 88 mg/dL (ref 70–99)
Glucose-Capillary: 106 mg/dL — ABNORMAL HIGH (ref 70–99)

## 2018-11-06 NOTE — Progress Notes (Signed)
[employer] [facility] [place] [facility]   PATIENT NAME: [name]    MR#:  [id number]  DATE OF BIRTH:  02-13-1956  SUBJECTIVE:  CHIEF COMPLAINT:   Chief Complaint  Patient presents with  . Chest Pain   Alert and awake. Back to baseline per wife  REVIEW OF SYSTEMS:  CONSTITUTIONAL: No fever, fatigue or weakness.  EYES: No blurred or double vision.  EARS, NOSE, AND THROAT: No tinnitus or ear pain.  RESPIRATORY: No cough, shortness of breath, wheezing or hemoptysis.  CARDIOVASCULAR: No chest pain, orthopnea, edema.  GASTROINTESTINAL: No nausea, vomiting, diarrhea or abdominal pain.  GENITOURINARY: No dysuria, hematuria.  ENDOCRINE: No polyuria, nocturia,  HEMATOLOGY: No anemia, easy bruising or bleeding SKIN: No rash or lesion. MUSCULOSKELETAL: No joint pain or arthritis.   NEUROLOGIC: No tingling, numbness, weakness.  PSYCHIATRY: Has anxiety and hallucinations.   ROS  DRUG ALLERGIES:   Allergies  Allergen Reactions  . Oxycodone Other (See Comments)    Hallucinations    VITALS:  Blood pressure 140/75, pulse 73, temperature (!) 97.5 F (36.4 C), temperature source Oral, resp. rate (!) 22, height 5\' 8"  (1.727 m), weight 95.3 kg, SpO2 98 %.  PHYSICAL EXAMINATION:  GENERAL:  62 y.o.-year-old patient lying in the bed with no acute distress.  EYES: Pupils equal, round, reactive to light and accommodation. No scleral icterus. Extraocular muscles intact.  HEENT: Head atraumatic, normocephalic. Oropharynx and nasopharynx clear.  NECK:  Supple, no jugular venous distention. No thyroid enlargement, no tenderness.  LUNGS: Normal breath sounds bilaterally, no wheezing, rales,rhonchi or crepitation. No use of accessory muscles of respiration.  CARDIOVASCULAR: S1, S2 normal. No murmurs, rubs, or gallops.  ABDOMEN: Soft, nontender, nondistended. Bowel sounds present. No organomegaly or mass.  EXTREMITIES: No pedal edema, cyanosis, or clubbing. Right knee surgery and  stiches present. NEUROLOGIC: Cranial nerves II through XII are intact. Muscle strength 5/5 in all extremities. Sensation intact. Gait not checked.  PSYCHIATRIC: The patient is alert and oriented x 3.  SKIN: No obvious rash, lesion, or ulcer.   Physical Exam LABORATORY PANEL:   CBC Recent Labs  Lab 11/03/18 0333  WBC 6.8  HGB 10.3*  HCT 32.8*  PLT 144*   ------------------------------------------------------------------------------------------------------------------  Chemistries  Recent Labs  Lab 11/02/18 0922 11/03/18 0333  NA 136 138  K 3.2* 3.9  CL 107 108  CO2 16* 22  GLUCOSE 61* 84  BUN 10 9  CREATININE 1.05 1.04  CALCIUM 8.5* 8.3*  AST 39  --   ALT 22  --   ALKPHOS 91  --   BILITOT 3.7*  --    ------------------------------------------------------------------------------------------------------------------  Cardiac Enzymes Recent Labs  Lab 11/03/18 1437 11/03/18 1832  TROPONINI <0.03 <0.03   ------------------------------------------------------------------------------------------------------------------  RADIOLOGY:  No results found.  ASSESSMENT AND PLAN:   Principal Problem:   Acute delirium Active Problems:   Acute encephalopathy  1.  Hallucinations with acute encephalopathy.  Likely secondary to medications.  Now resolved .Appreciate psychiatry input. Resolved 2.  Recent right knee replacement.  Change pain control to Tylenol and PRN Toradol for right now. 3.  Hypokalemia replace magnesium and potassium 4.  Chest pain.  Cardiac enzymes and EKG negative.  Chronic pain since his CABG.  No change. 5.  Thyroid nodules and some superior mediastinal lymph nodes.  Check thyroid function - within range.  Will need outpatient ultrasound of the thyroid. 6.  Type 2 diabetes mellitus put on sliding scale.  Hold Glucophage after CAT scan 7.  Hypertension continue usual medications 8.  Hyperlipidemia unspecified on Lipitor 9.  Anxiety depression continue  psychiatric medications- call psych consult. 10.  BPH on Flomax 11.  Obesity with a BMI of 31.93.  Weight loss needed  Has gait abnormalities. Unclear if this is due to meds. Will get MRI brain   All the records are reviewed and case discussed with Care Management/Social Worker Management plans discussed with the patient, family and they are in agreement.  CODE STATUS: DNR  TOTAL TIME TAKING CARE OF THIS PATIENT: 35 minutes.   D/C to SNF  Orie FishermanSrikar R Tiera Mensinger M.D on 11/06/2018   Between 7am to 6pm - Pager - [phone]  After 6pm go to [email or web address] - password [employer] [facility]  [facility]  Office  [phone]  CC: Primary care physician; Dorothey BasemanBronstein, David, MD  Note: This dictation was prepared with Dragon dictation along with smaller phrase technology. Any transcriptional errors that result from this process are unintentional.

## 2018-11-06 NOTE — Progress Notes (Signed)
[facility] - [place] at [facility]   PATIENT NAME: [name]    MR#:  [id number]  DATE OF BIRTH:  02-18-1956  SUBJECTIVE:  CHIEF COMPLAINT:   Chief Complaint  Patient presents with  . Chest Pain   Alert and awake. Back to baseline per wife  REVIEW OF SYSTEMS:  CONSTITUTIONAL: No fever, fatigue or weakness.  EYES: No blurred or double vision.  EARS, NOSE, AND THROAT: No tinnitus or ear pain.  RESPIRATORY: No cough, shortness of breath, wheezing or hemoptysis.  CARDIOVASCULAR: No chest pain, orthopnea, edema.  GASTROINTESTINAL: No nausea, vomiting, diarrhea or abdominal pain.  GENITOURINARY: No dysuria, hematuria.  ENDOCRINE: No polyuria, nocturia,  HEMATOLOGY: No anemia, easy bruising or bleeding SKIN: No rash or lesion. MUSCULOSKELETAL: No joint pain or arthritis.   NEUROLOGIC: No tingling, numbness, weakness.  PSYCHIATRY: Has anxiety and hallucinations.   ROS  DRUG ALLERGIES:   Allergies  Allergen Reactions  . Oxycodone Other (See Comments)    Hallucinations    VITALS:  Blood pressure 140/75, pulse 73, temperature (!) 97.5 F (36.4 C), temperature source Oral, resp. rate (!) 22, height 5\' 8"  (1.727 m), weight 95.3 kg, SpO2 98 %.  PHYSICAL EXAMINATION:  GENERAL:  62 y.o.-year-old patient lying in the bed with no acute distress.  EYES: Pupils equal, round, reactive to light and accommodation. No scleral icterus. Extraocular muscles intact.  HEENT: Head atraumatic, normocephalic. Oropharynx and nasopharynx clear.  NECK:  Supple, no jugular venous distention. No thyroid enlargement, no tenderness.  LUNGS: Normal breath sounds bilaterally, no wheezing, rales,rhonchi or crepitation. No use of accessory muscles of respiration.  CARDIOVASCULAR: S1, S2 normal. No murmurs, rubs, or gallops.  ABDOMEN: Soft, nontender, nondistended. Bowel sounds present. No organomegaly or mass.  EXTREMITIES: No pedal edema, cyanosis, or clubbing. Right knee surgery and  stiches present. NEUROLOGIC: Cranial nerves II through XII are intact. Muscle strength 5/5 in all extremities. Sensation intact. Gait not checked.  PSYCHIATRIC: The patient is alert and oriented x 3.  SKIN: No obvious rash, lesion, or ulcer.   Physical Exam LABORATORY PANEL:   CBC Recent Labs  Lab 11/03/18 0333  WBC 6.8  HGB 10.3*  HCT 32.8*  PLT 144*   ------------------------------------------------------------------------------------------------------------------  Chemistries  Recent Labs  Lab 11/02/18 0922 11/03/18 0333  NA 136 138  K 3.2* 3.9  CL 107 108  CO2 16* 22  GLUCOSE 61* 84  BUN 10 9  CREATININE 1.05 1.04  CALCIUM 8.5* 8.3*  AST 39  --   ALT 22  --   ALKPHOS 91  --   BILITOT 3.7*  --    ------------------------------------------------------------------------------------------------------------------  Cardiac Enzymes Recent Labs  Lab 11/03/18 1437 11/03/18 1832  TROPONINI <0.03 <0.03   ------------------------------------------------------------------------------------------------------------------  RADIOLOGY:  No results found.  ASSESSMENT AND PLAN:   Principal Problem:   Acute delirium Active Problems:   Acute encephalopathy  1.  Hallucinations with acute encephalopathy.  Likely secondary to medications.  Now resolved .Appreciate psychiatry input. Resolved. Will stop risperidone tomorrow. 2.  Recent right knee replacement.  Change pain control to Tylenol and PRN Toradol for right now. 3.  Hypokalemia replace magnesium and potassium 4.  Chest pain.  Cardiac enzymes and EKG negative.  Chronic pain since his CABG.  No change. 5.  Thyroid nodules and some superior mediastinal lymph nodes.  Check thyroid function - within range.  Will need outpatient ultrasound of the thyroid. 6.  Type 2 diabetes mellitus put on sliding scale.  Hold Glucophage after  CAT scan 7.  Hypertension continue usual medications 8.  Hyperlipidemia unspecified on  Lipitor 9.  Anxiety depression continue psychiatric medications- call psych consult. 10.  BPH on Flomax 11.  Obesity with a BMI of 31.93.  Weight loss needed  Has gait abnormalities. Unclear if this is due to meds. MRI brain ordered and pending   All the records are reviewed and case discussed with Care Management/Social Worker Management plans discussed with the patient, family and they are in agreement.  CODE STATUS: DNR  TOTAL TIME TAKING CARE OF THIS PATIENT: 35 minutes.   D/C to SNF  Orie FishermanSrikar R Iyana Topor M.D on 11/06/2018   Between 7am to 6pm - Pager - [phone]  After 6pm go to [email or web address] - password [employer] [email or web address]  [facility]  Office  [phone]  CC: Primary care physician; Dorothey BasemanBronstein, David, MD  Note: This dictation was prepared with Dragon dictation along with smaller phrase technology. Any transcriptional errors that result from this process are unintentional.

## 2018-11-06 NOTE — Progress Notes (Signed)
Subjective:   S/P right total knee arthroplasty performed on 10/23/18 Patient reports pain as mild.   Patient is well, and has had no acute complaints or problems Plan is to go Home after hospital stay. Negative for chest pain and shortness of breath Fever: no Gastrointestinal:Negative for nausea and vomiting Patient admitted for shortness of breath and hallucinations while taking pain medication. States that recent hallucinations have stopped, on Zyprexa and Risperdal prescribed by Psychiatry.  Objective: Vital signs in last 24 hours: Temp:  [97.5 F (36.4 C)-98.6 F (37 C)] 97.5 F (36.4 C) (12/16 0413) Pulse Rate:  [71-78] 74 (12/16 0413) Resp:  [15-16] 15 (12/16 0413) BP: (124-140)/(71-82) 124/77 (12/16 0413) SpO2:  [96 %-100 %] 96 % (12/16 0413)  Intake/Output from previous day:  Intake/Output Summary (Last 24 hours) at 11/06/2018 0810 Last data filed at 11/06/2018 0300 Gross per 24 hour  Intake -  Output 1850 ml  Net -1850 ml    Intake/Output this shift: No intake/output data recorded.  Labs: No results for input(s): HGB in the last 72 hours. No results for input(s): WBC, RBC, HCT, PLT in the last 72 hours. No results for input(s): NA, K, CL, CO2, BUN, CREATININE, GLUCOSE, CALCIUM in the last 72 hours. No results for input(s): LABPT, INR in the last 72 hours.   EXAM General - Patient is Alert, Appropriate and Oriented Extremity - ABD soft Neurovascular intact Sensation intact distally Intact pulses distally Dorsiflexion/Plantar flexion intact Incision: dressing C/D/I No cellulitis present Dressing/Incision - clean, dry, no drainage Staples were removed today and benzoin with steri-strips were applied. No signs of infection, no significant knee effusion or swelling. Motor Function - intact, moving foot and toes well on exam.  Abdomen soft with normal bowel sounds this AM.  Past Medical History:  Diagnosis Date  . Anxiety   . Atherosclerosis of coronary  artery bypass graft with angina pectoris (HCC)   . Barrett's esophagus   . Benign enlargement of prostate   . Chronic constipation   . Colon polyp   . Coronary artery disease   . Depression   . GERD (gastroesophageal reflux disease)   . H/O esophageal spasm   . Hyperlipidemia   . Hypertension   . MI (myocardial infarction) (HCC) 2011 & 2014   x 2  . Sleep apnea     after weight loss no need for CPAP  . Type 2 diabetes mellitus (HCC)     Assessment/Plan:   S/P right total knee arthroplasty Principal Problem:   Acute delirium Active Problems:   Acute encephalopathy  Estimated body mass index is 31.93 kg/m as calculated from the following:   Height as of this encounter: 5\' 8"  (1.727 m).   Weight as of this encounter: 95.3 kg.  Pt reports improve hallucinations today, denies having any last week. CT scan of chest negative for acute PE.  CT scan of brain negative for any acute findings. Staples were removed today without complication.  No signs of infections to the right knee this AM. Continue to avoid any narcotic pain medication at this time. Plan for possible discharge to rehab today. Continue Lovenox for 5 additional days upon discharge. Patient should follow-up at his already scheduled appointment with Dr. Joice LoftsPoggi in 4 weeks, call for sooner appointment if any issues arise.  DVT Prophylaxis - Lovenox and TED hose Weight-Bearing as tolerated to right leg  J. Horris LatinoLance Angas Isabell, PA-C [facility] 11/06/2018, 8:10 AM

## 2018-11-06 NOTE — [place] FL2 (Signed)
[place] MEDICAID FL2 LEVEL OF CARE SCREENING TOOL     IDENTIFICATION  Patient Name: [name] Birthdate: 05/12/1956 Sex: male Admission Date (Current Location): 11/02/2018  [place]   Facility and Address:  [facility], [address], [place], [place] [address]      Provider Number: [id number]  Attending Physician Name and Address:  Milagros LollSudini, Srikar, MD  Relative Name and Phone Number:       Current Level of Care: Hospital Recommended Level of Care: Skilled Nursing Facility Prior Approval Number:    Date Approved/Denied:   PASRR Number: [id number]  Discharge Plan: SNF    Current Diagnoses: Patient Active Problem List   Diagnosis Date Noted  . Acute delirium 11/03/2018  . Acute encephalopathy 11/02/2018  . Status post total knee replacement using cement, right 10/23/2018  . Status post unicompartmental knee replacement, right 07/19/2017  . Viral URI with cough 11/10/2015  . Falls 08/21/2015  . Coronary artery disease 08/21/2015  . Sleep apnea 03/26/2015  . Chest discomfort 01/28/2015  . Shortness of breath 01/28/2015  . S/P CABG x 2 01/28/2015  . Hyperlipidemia 01/28/2015  . Type 2 diabetes mellitus with other circulatory complications (HCC) 01/28/2015  . Morbid obesity (HCC) 01/28/2015  . Orthostatic hypotension 01/28/2015    Orientation RESPIRATION BLADDER Height & Weight     Self, Place  Normal Continent Weight: 210 lb (95.3 kg) Height:  5\' 8"  (172.7 cm)  BEHAVIORAL SYMPTOMS/MOOD NEUROLOGICAL BOWEL NUTRITION STATUS  (none) (none) Continent Diet(Heart Healthy )  AMBULATORY STATUS COMMUNICATION OF NEEDS Skin   Extensive Assist Verbally Surgical wounds(Right knee incision )                       Personal Care Assistance Level of Assistance  Bathing, Feeding, Dressing Bathing Assistance: Limited assistance Feeding assistance: Independent Dressing Assistance: Limited assistance      Functional Limitations Info  Sight, Hearing, Speech Sight Info: Adequate Hearing Info: Adequate Speech Info: Adequate    SPECIAL CARE FACTORS FREQUENCY  PT (By licensed PT), OT (By licensed OT)     PT Frequency: 5 OT Frequency: 5            Contractures Contractures Info: Not present    Additional Factors Info  Code Status, Allergies Code Status Info: DNR Allergies Info: Oxycodone           Current Medications (11/06/2018):  This is the current hospital active medication list Current Facility-Administered Medications  Medication Dose Route Frequency Provider Last Rate Last Dose  . 0.9 %  sodium chloride infusion   Intravenous PRN Alford HighlandWieting, Richard, MD   Stopped at 11/02/18 1815  . acetaminophen (TYLENOL) tablet 650 mg  650 mg Oral Q6H PRN Alford HighlandWieting, Richard, MD   650 mg at 11/06/18 0013  . albuterol (PROVENTIL) (2.5 MG/3ML) 0.083% nebulizer solution 2.5 mg  2.5 mg Inhalation Q6H PRN Alford HighlandWieting, Richard, MD      . aspirin EC tablet 81 mg  81 mg Oral Daily Alford HighlandWieting, Richard, MD   81 mg at 11/06/18 0853  . atorvastatin (LIPITOR) tablet 40 mg  40 mg Oral QHS Alford HighlandWieting, Richard, MD   40 mg at 11/05/18 2251  . enoxaparin (LOVENOX) injection 40 mg  40 mg Subcutaneous Q24H Alford HighlandWieting, Richard, MD   40 mg at 11/06/18 0854  . ezetimibe (ZETIA) tablet 10 mg  10 mg Oral Daily Alford HighlandWieting, Richard, MD   10 mg at 11/06/18 0853  . ketorolac (  TORADOL) 15 MG/ML injection 15 mg  15 mg Intravenous Q6H PRN Wieting, Richard, MD      . loratadine (CLARITIN) tablet 10 mg  10 mg Oral QHS Alford Highland, MD   10 mg at 11/05/18 2251  . midodrine (PROAMATINE) tablet 10 mg  10 mg Oral TID PC Alford Highland, MD   10 mg at 11/06/18 0853  . mometasone-formoterol (DULERA) 200-5 MCG/ACT inhaler 2 puff  2 puff Inhalation BID Alford Highland, MD   2 puff at 11/06/18 0854  . nitroGLYCERIN (NITROSTAT) SL tablet 0.4 mg  0.4 mg Sublingual Q5 min PRN Alford Highland, MD   0.4 mg at 11/03/18 1017  . OLANZapine zydis  (ZYPREXA) disintegrating tablet 15 mg  15 mg Oral QHS Clapacs, John T, MD   15 mg at 11/05/18 2251  . ondansetron (ZOFRAN) tablet 4 mg  4 mg Oral Q6H PRN Alford Highland, MD       Or  . ondansetron (ZOFRAN) injection 4 mg  4 mg Intravenous Q6H PRN Wieting, Richard, MD      . pantoprazole (PROTONIX) EC tablet 40 mg  40 mg Oral QHS Alford Highland, MD   40 mg at 11/05/18 2251  . risperiDONE (RISPERDAL M-TABS) disintegrating tablet 0.5 mg  0.5 mg Oral BID Clapacs, Jackquline Denmark, MD   0.5 mg at 11/06/18 0853  . tamsulosin (FLOMAX) capsule 0.4 mg  0.4 mg Oral QHS Alford Highland, MD   0.4 mg at 11/05/18 2251  . venlafaxine XR (EFFEXOR-XR) 24 hr capsule 75 mg  75 mg Oral Daily Clapacs, Jackquline Denmark, MD   75 mg at 11/06/18 1610     Discharge Medications: Please see discharge summary for a list of discharge medications.  Relevant Imaging Results:  Relevant Lab Results:   Additional Information SSN: [id number]  Ruthe Mannan, [place]

## 2018-11-06 NOTE — Plan of Care (Signed)

## 2018-11-06 NOTE — Clinical Social Work Note (Signed)
Clinical Social Work Assessment  Patient Details  Name: [name] MRN: [id number] Date of Birth: 1956-04-06  Date of referral:  11/06/18               Reason for consult:  Facility Placement                Permission sought to share information with:  Case Manager, Customer service manager, Family Supports Permission granted to share information::  Yes, Verbal Permission Granted  Name::      SNF  Agency::   [place] [facility]   Relationship::     Contact Information:     Housing/Transportation Living arrangements for the past 2 months:  Single Family Home Source of Information:  Spouse Patient Interpreter Needed:  None Criminal Activity/Legal Involvement Pertinent to Current Situation/Hospitalization:  No - Comment as needed Significant Relationships:  Spouse Lives with:  Spouse Do you feel safe going back to the place where you live?  Yes Need for family participation in patient care:  Yes (Comment)  Care giving concerns:  Patient lives with spouse in [place] Worker assessment / plan:  CSW consulted for SNF placement. CSW met with patient and wife at bedside. Patient is very lethargic and asked that wife make decisions. CSW explained PT recommendation of SNF. Wife is accepting of recommendation and bed search. CSW explained that patient will require insurance approval. Wife understands and states that they would like to see what beds are available for patient before making a decision. CSW will begin bed search and give offers once received. CSW will follow for discharge planning.   Employment status:  Retired Nurse, adult PT Recommendations:  [place] / Referral to community resources:  [place]  Patient/Family's Response to care:  Patient and wife thanked CSW for assistance   Patient/Family's Understanding of and Emotional Response to Diagnosis, Current Treatment, and Prognosis:  Wife  states understanding of current diagnoses and treatment   Emotional Assessment Appearance:  Appears stated age Attitude/Demeanor/Rapport:  Lethargic Affect (typically observed):  Accepting Orientation:  Oriented to Self, Oriented to Place Alcohol / Substance use:  Not Applicable Psych involvement (Current and /or in the community):  No (Comment)  Discharge Needs  Concerns to be addressed:  Discharge Planning Concerns Readmission within the last 30 days:  No Current discharge risk:  Dependent with Mobility Barriers to Discharge:  Continued Medical Work up   [employer], [place] 11/06/2018, 10:59 AM

## 2018-11-06 NOTE — Progress Notes (Signed)
Physical Therapy Treatment Patient Details Name: [name] H Nading MRN: [id number] DOB: 04/16/1956 Today's Date: 11/06/2018    History of Present Illness presented to ER secondary to AMS, chest pain/SOB, hallucinations; admitted with acute encephalopathy, likely due to pain medication.  Of note, patient with recent R TKR (12/2); has been home with outpatient PT services    PT Comments    Pt agreeable to PT; reports 5/10 pain R knee. Pt progressing all mobility with Mod I for bed mobility, Min guard transfers and Min A to ambulate. Pt requires cues for improved use of RLE with transfers and able to demonstrate correction with cues. Ambulation improving, but continues to display unsteadiness and decreased fluidity with cadence. Pt received up in chair with focus on strengthening and stretching of R knee. Continue PT to progress strength and endurance as well as balance to improve quality of all functional mobility.    Follow Up Recommendations        Equipment Recommendations       Recommendations for Other Services       Precautions / Restrictions Precautions Precautions: Fall Restrictions Weight Bearing Restrictions: Yes RLE Weight Bearing: Weight bearing as tolerated    Mobility  Bed Mobility Overal bed mobility: Modified Independent Bed Mobility: Supine to Sit     Supine to sit: Modified independent (Device/Increase time)     General bed mobility comments: Mild increased time; no physical assist  Transfers Overall transfer level: Needs assistance Equipment used: Rolling walker (2 wheeled) Transfers: Sit to/from Stand Sit to Stand: Min guard         General transfer comment: cues for improved use of RLE  Ambulation/Gait Ambulation/Gait assistance: Min assist Gait Distance (Feet): 40 Feet Assistive device: Rolling walker (2 wheeled) Gait Pattern/deviations: Step-to pattern;Step-through pattern Gait velocity: decreased   General Gait Details: mildly unsteady,  uncoordinated/mildly jerky at times. Improving overall, but requires def Min A   Stairs             Wheelchair Mobility    Modified Rankin (Stroke Patients Only)       Balance Overall balance assessment: Needs assistance Sitting-balance support: No upper extremity supported;Feet supported Sitting balance-Leahy Scale: Good     Standing balance support: Bilateral upper extremity supported Standing balance-Leahy Scale: Fair                              Cognition Arousal/Alertness: Awake/alert Behavior During Therapy: WFL for tasks assessed/performed Overall Cognitive Status: Within Functional Limits for tasks assessed                                        Exercises Total Joint Exercises Quad Sets: Strengthening;Both;20 reps(also in stand with wt shifting) Long Arc Quad: AROM;Strengthening;Right;20 reps;Seated Knee Flexion: AROM;Right;10 reps;Seated(3 positions each rep with 10 sec hold each position) Goniometric ROM: 4-95    General Comments        Pertinent Vitals/Pain Pain Assessment: 0-10 Pain Score: 5  Pain Location: R knee Pain Descriptors / Indicators: Aching;Grimacing;Guarding    Home Living                      Prior Function            PT Goals (current goals can now be found in the care plan section) Progress towards PT goals: Progressing toward goals  Frequency    7X/week      PT Plan Current plan remains appropriate    Co-evaluation              AM-PAC PT "6 Clicks" Mobility   Outcome Measure  Help needed turning from your back to your side while in a flat bed without using bedrails?: None Help needed moving from lying on your back to sitting on the side of a flat bed without using bedrails?: None Help needed moving to and from a bed to a chair (including a wheelchair)?: A Little Help needed standing up from a chair using your arms (e.g., wheelchair or bedside chair)?: A Little Help  needed to walk in hospital room?: A Little Help needed climbing 3-5 steps with a railing? : A Lot 6 Click Score: 19    End of Session Equipment Utilized During Treatment: Gait belt Activity Tolerance: Patient tolerated treatment well Patient left: in chair;with call bell/phone within reach;with chair alarm set;with family/visitor present;Other (comment)(Ice applied)   PT Visit Diagnosis: Muscle weakness (generalized) (M62.81);Difficulty in walking, not elsewhere classified (R26.2)     Time: 1610-9604 PT Time Calculation (min) (ACUTE ONLY): 35 min  Charges:  $Gait Training: 8-22 mins $Therapeutic Exercise: 8-22 mins                      Scot Dock, PTA 11/06/2018, 1:09 PM

## 2018-11-07 ENCOUNTER — Ambulatory Visit: Payer: Self-pay

## 2018-11-07 ENCOUNTER — Other Ambulatory Visit: Payer: Self-pay

## 2018-11-07 DIAGNOSIS — E876 Hypokalemia: Secondary | ICD-10-CM | POA: Diagnosis not present

## 2018-11-07 DIAGNOSIS — G9340 Encephalopathy, unspecified: Secondary | ICD-10-CM | POA: Diagnosis not present

## 2018-11-07 DIAGNOSIS — R443 Hallucinations, unspecified: Secondary | ICD-10-CM | POA: Diagnosis not present

## 2018-11-07 DIAGNOSIS — R079 Chest pain, unspecified: Secondary | ICD-10-CM | POA: Diagnosis not present

## 2018-11-07 LAB — CBC
HCT: 37.1 % — ABNORMAL LOW (ref 39.0–52.0)
Hemoglobin: 11.7 g/dL — ABNORMAL LOW (ref 13.0–17.0)
MCH: 26.9 pg (ref 26.0–34.0)
MCHC: 31.5 g/dL (ref 30.0–36.0)
MCV: 85.3 fL (ref 80.0–100.0)
Platelets: 175 10*3/uL (ref 150–400)
RBC: 4.35 MIL/uL (ref 4.22–5.81)
RDW: 15.8 % — ABNORMAL HIGH (ref 11.5–15.5)
WBC: 6.1 10*3/uL (ref 4.0–10.5)
nRBC: 0 % (ref 0.0–0.2)

## 2018-11-07 LAB — CREATININE, SERUM
Creatinine, Ser: 1.12 mg/dL (ref 0.61–1.24)
GFR calc Af Amer: >60 mL/min (ref >60)
GFR calc non Af Amer: >60 mL/min (ref >60)

## 2018-11-07 LAB — GLUCOSE, CAPILLARY: Glucose-Capillary: 94 mg/dL (ref 70–99)

## 2018-11-07 MED ORDER — SENNOSIDES-DOCUSATE SODIUM 8.6-50 MG PO TABS
1.0000 | ORAL_TABLET | Freq: Two times a day (BID) | ORAL | Status: DC
  Administered 2018-11-07: 09:00:00 1 via ORAL
  Filled 2018-11-07: qty 1

## 2018-11-07 NOTE — Patient Outreach (Signed)
[facility] Care Management  11/07/2018  Benedetto Coonsddie H Lantzy 06/08/1956 [id number]    EMMI-GENERAL DISCHARGE F/U   Per review of chart in preparation to contact patient, noted patient was [facility]/18 for chest pain and is pending discharge today.    Plan: RN CM will reschedule today's f/u call. RN CM will attempt outreach to patient tomorrow to assess for CM needs.    Delsa SaleAngel Danaija Eskridge, RN,CCM [facility] Care Management Care Management Coordinator Direct Phone: [phone] Toll Free: [phone] Fax: [phone]

## 2018-11-07 NOTE — Clinical Social Work Note (Signed)
Patient was re evaluated by PT today. PT has changed recommendation to home with home health. Patient and wife would prefer to go home at this time. CSW notified [facility] and [employer] of the change. CSW also notified RNCM of need for home health. CSW signing off.   Ruthe Mannanandace Natisha Trzcinski MSW, [address] [phone]

## 2018-11-07 NOTE — Progress Notes (Signed)
Received Md order to discharge patient to home, reviewed discharge instructions, home meds and follow up appointments with  patient and patient verbalized understanding   

## 2018-11-07 NOTE — Progress Notes (Signed)
Family now wanting to provide transport home. Primary RN made aware. EMS canceled. Bo McclintockBrewer,Anvi Mangal S, RN

## 2018-11-07 NOTE — Progress Notes (Signed)
EMS called for transport. Daelon Dunivan S, RN  

## 2018-11-07 NOTE — Clinical Social Work Note (Signed)
CSW gave bed offers to patient and wife at bedside. Both agreed on [facility]. CSW notified [facility]. CSW waiting on [employer] authorization. CSW will continue to follow.   [name] MSW, [address] [phone]

## 2018-11-07 NOTE — Care Management (Addendum)
Physical therapy recommending home with home health physical therapy.  Home Health [employer] information given to Mr and [name]. Chose [facility]. Will update [name], [facility] representative. Chose [facility]. Unable to take due to insurance not being in network. [name] Will accept for [facility].  Request transportation per [place] [facility] Gwenette GreetBrenda S Maraya Gwilliam RN MSN CCM Care Management [phone]

## 2018-11-07 NOTE — Progress Notes (Signed)
Physical Therapy Treatment Patient Details Name: [name] MRN: [id number] DOB: [birth year] Today's Date: 11/07/2018    History of Present Illness presented to ER secondary to AMS, chest pain/SOB, hallucinations; admitted with acute encephalopathy, likely due to pain medication.  Of note, patient with recent R TKR (12/2); has been home with outpatient PT services    PT Comments    Pt sitting on EOB upon arrival.  Mentally cleared and wanting to go home vs rehab today.  Wife in for session.  Pt was able to stand and ambulate 49' then an additional 300' to rehab gym for stair training. Gait remains unsteady at times with walker.  Narrow BOS with verbal cues to increase and min assist at times to correct balance deficits.  Pt exhibits increased anxiety at times especially if he cannot see his wife and likes her in his line of sight at all times. When he turns to look for her during gait, he will cross his feet and requires min assist to regain and prevent fall while looking over his shoulder.  While he navigated stairs well with crutches and min guard/assist, anxiety increased at the top of the stairs causing him to have poor judgement and overall safety awareness.  Required mod verbal cues for calming and overall education for safety.  HR and O2 were monitored frequently during session.  O2 remained generally stable in high 90's and HR around 100.  During anxiety episodes, it was noted that HR increased to 130+ and O2 did decrease to 80's but would recover quickly with seated rest.    Discussed discharge plan with pt and wife.  They both feel comfortable with discharge home although wife has some hesitancy regarding assisting him on the stairs into their home.  They would like EMS transport home and she is looking into having hand rails installed as soon as possible to increase safety.  Education and encouragement given to have +1 assist at all times upon discharge and they voiced understanding  along with gait belt use.    Discussed with PT, RN and MD.    Follow Up Recommendations  Home health PT, +1 assist with mobilty     Equipment Recommendations       Recommendations for Other Services       Precautions / Restrictions Precautions Precautions: Fall Precaution Comments: anxiety Restrictions Weight Bearing Restrictions: Yes RLE Weight Bearing: Weight bearing as tolerated    Mobility  Bed Mobility Overal bed mobility: Modified Independent Bed Mobility: Supine to Sit     Supine to sit: Modified independent (Device/Increase time) Sit to supine: Supervision   General bed mobility comments: Mild increased time; no physical assist  Transfers Overall transfer level: Needs assistance Equipment used: Rolling walker (2 wheeled) Transfers: Sit to/from Stand Sit to Stand: Min guard            Ambulation/Gait Ambulation/Gait assistance: Min Chemical engineer (Feet): 300 Feet Assistive device: Rolling walker (2 wheeled) Gait Pattern/deviations: Step-through pattern;Narrow base of support Gait velocity: decreased   General Gait Details: mildly unsteady, anxiety limiting at times.     Stairs Stairs: Yes Stairs assistance: Min assist Stair Management: With crutches Number of Stairs: 4 General stair comments: Physically does well with min gaurd/assist but increased anxiety when at top of stairs with increased respirations and HR.  Calming needed and encouragement for safety.   Wheelchair Mobility    Modified Rankin (Stroke Patients Only)       Balance Overall balance assessment: Needs  assistance Sitting-balance support: No upper extremity supported;Feet supported Sitting balance-Leahy Scale: Good     Standing balance support: Bilateral upper extremity supported Standing balance-Leahy Scale: Fair Standing balance comment: occasional LOB noted but able to control/recover with min a.  Pt and wife educated on safety.                             Cognition Arousal/Alertness: Awake/alert Behavior During Therapy: WFL for tasks assessed/performed;Anxious Overall Cognitive Status: Within Functional Limits for tasks assessed                           Safety/Judgement: Decreased awareness of safety            Exercises      General Comments        Pertinent Vitals/Pain Pain Assessment: Faces Faces Pain Scale: Hurts a little bit Pain Location: R knee Pain Descriptors / Indicators: Aching;Grimacing;Guarding Pain Intervention(s): Limited activity within patient's tolerance;Monitored during session    Home Living                      Prior Function            PT Goals (current goals can now be found in the care plan section) Progress towards PT goals: Progressing toward goals    Frequency    7X/week      PT Plan Discharge plan needs to be updated    Co-evaluation              AM-PAC PT "6 Clicks" Mobility   Outcome Measure  Help needed turning from your back to your side while in a flat bed without using bedrails?: None Help needed moving from lying on your back to sitting on the side of a flat bed without using bedrails?: None Help needed moving to and from a bed to a chair (including a wheelchair)?: A Little Help needed standing up from a chair using your arms (e.g., wheelchair or bedside chair)?: A Little Help needed to walk in hospital room?: A Little Help needed climbing 3-5 steps with a railing? : A Little 6 Click Score: 20    End of Session Equipment Utilized During Treatment: Gait belt Activity Tolerance: Patient tolerated treatment well Patient left: in chair;with call bell/phone within reach;with chair alarm set;with family/visitor present Nurse Communication: Mobility status       Time: [id number] PT Time Calculation (min) (ACUTE ONLY): 44 min  Charges:  $Gait Training: 23-37 mins $Therapeutic Exercise: 8-22 mins                     Danielle DessSarah Mathews Stuhr,  PTA 11/07/18, 10:10 AM

## 2018-11-08 ENCOUNTER — Other Ambulatory Visit: Payer: Self-pay

## 2018-11-08 NOTE — Patient Outreach (Addendum)
[facility]) Care Management  11/08/2018  [name] 01/13/1956 [id number]    EMMI-General discharge FOLLOW-UP call  RED ON EMMI ALERT Day # 4 Date:10/30/18 11:26 AM Red Alert Reason: "Lost interest in things? Yes", Sad/hopeless/anxious/empty? Yes"   Outreach follow-up attempt # 1 successful. Spoke with patient and wife [name]. Patient states he was readmitted to [facility] on 11/02/18 as a result of having an adverse event to his pain medication causing him to have severe hallucinations. He was discharged home with his wife who is caring for him. Patient states his pain med was d/c and he is  prescribed to take Tylenol prn for pain. He reports his pain is minimal. Patient stopped the call and ask for a call right back "in about 5 minutes".  Call placed to patient to complete the follow-up. Spoke with wife [name] listed as an approved person to speak with, HIPAA verified. She agree's his pain is minimal. Patient is taking Tylenol as needed, but mostly when performing PT. [facility] will be providing in home PT and a PT eval is pending, however, therapy may not start until after the holidays. Wife is communicating with the agency to coordinate therapy. She has 1 home safety concern related to having 2 small steps leading away from the front of the exterior of the home. She is working on having a hand rail placed and this has been ordered. She will provide the patient's transportation to MD appts as needed. Patient is scheduled to f/u with his PCP on 11/13/18. Wife will review patient's meds during the visit to confirm the changes made while he was IP is agreeable to his PCP. She denies questions related to his meds, also denies financial concerns related to paying for his meds. Wife states his mentality and mood have improved greatly and he is alert and his thinking is clearer. She voices having no questions or concerns at this time. Provided wife with the  [facility] 24/7 nurse advice line phone number.  Plan: RN CM will make 1 final f/u with patient/wife in 1-2 weeks to confirm patient has all services in place and is progressing toward wellness.   Delsa SaleAngel Yolanda Dockendorf, RN,CCM [facility] Care Management Care Management Coordinator Direct Phone: [phone] Toll Free: [phone] Fax: [phone]

## 2018-11-11 DIAGNOSIS — K5909 Other constipation: Secondary | ICD-10-CM | POA: Diagnosis not present

## 2018-11-11 DIAGNOSIS — Z9181 History of falling: Secondary | ICD-10-CM | POA: Diagnosis not present

## 2018-11-11 DIAGNOSIS — Z951 Presence of aortocoronary bypass graft: Secondary | ICD-10-CM | POA: Diagnosis not present

## 2018-11-11 DIAGNOSIS — N400 Enlarged prostate without lower urinary tract symptoms: Secondary | ICD-10-CM | POA: Diagnosis not present

## 2018-11-11 DIAGNOSIS — Z6831 Body mass index (BMI) 31.0-31.9, adult: Secondary | ICD-10-CM | POA: Diagnosis not present

## 2018-11-11 DIAGNOSIS — I252 Old myocardial infarction: Secondary | ICD-10-CM | POA: Diagnosis not present

## 2018-11-11 DIAGNOSIS — I951 Orthostatic hypotension: Secondary | ICD-10-CM | POA: Diagnosis not present

## 2018-11-11 DIAGNOSIS — F329 Major depressive disorder, single episode, unspecified: Secondary | ICD-10-CM | POA: Diagnosis not present

## 2018-11-11 DIAGNOSIS — I10 Essential (primary) hypertension: Secondary | ICD-10-CM | POA: Diagnosis not present

## 2018-11-11 DIAGNOSIS — E785 Hyperlipidemia, unspecified: Secondary | ICD-10-CM | POA: Diagnosis not present

## 2018-11-11 DIAGNOSIS — Z7982 Long term (current) use of aspirin: Secondary | ICD-10-CM | POA: Diagnosis not present

## 2018-11-11 DIAGNOSIS — G4733 Obstructive sleep apnea (adult) (pediatric): Secondary | ICD-10-CM | POA: Diagnosis not present

## 2018-11-11 DIAGNOSIS — I2510 Atherosclerotic heart disease of native coronary artery without angina pectoris: Secondary | ICD-10-CM | POA: Diagnosis not present

## 2018-11-11 DIAGNOSIS — T84032D Mechanical loosening of internal right knee prosthetic joint, subsequent encounter: Secondary | ICD-10-CM | POA: Diagnosis not present

## 2018-11-11 DIAGNOSIS — Z86010 Personal history of colonic polyps: Secondary | ICD-10-CM | POA: Diagnosis not present

## 2018-11-11 DIAGNOSIS — E1159 Type 2 diabetes mellitus with other circulatory complications: Secondary | ICD-10-CM | POA: Diagnosis not present

## 2018-11-11 DIAGNOSIS — F419 Anxiety disorder, unspecified: Secondary | ICD-10-CM | POA: Diagnosis not present

## 2018-11-13 DIAGNOSIS — F4489 Other dissociative and conversion disorders: Secondary | ICD-10-CM | POA: Diagnosis not present

## 2018-11-20 DIAGNOSIS — Z6831 Body mass index (BMI) 31.0-31.9, adult: Secondary | ICD-10-CM | POA: Diagnosis not present

## 2018-11-20 DIAGNOSIS — N400 Enlarged prostate without lower urinary tract symptoms: Secondary | ICD-10-CM | POA: Diagnosis not present

## 2018-11-20 DIAGNOSIS — I10 Essential (primary) hypertension: Secondary | ICD-10-CM | POA: Diagnosis not present

## 2018-11-20 DIAGNOSIS — F419 Anxiety disorder, unspecified: Secondary | ICD-10-CM | POA: Diagnosis not present

## 2018-11-20 DIAGNOSIS — F329 Major depressive disorder, single episode, unspecified: Secondary | ICD-10-CM | POA: Diagnosis not present

## 2018-11-20 DIAGNOSIS — I2510 Atherosclerotic heart disease of native coronary artery without angina pectoris: Secondary | ICD-10-CM | POA: Diagnosis not present

## 2018-11-20 DIAGNOSIS — Z9181 History of falling: Secondary | ICD-10-CM | POA: Diagnosis not present

## 2018-11-20 DIAGNOSIS — G4733 Obstructive sleep apnea (adult) (pediatric): Secondary | ICD-10-CM | POA: Diagnosis not present

## 2018-11-20 DIAGNOSIS — K5909 Other constipation: Secondary | ICD-10-CM | POA: Diagnosis not present

## 2018-11-20 DIAGNOSIS — T84032D Mechanical loosening of internal right knee prosthetic joint, subsequent encounter: Secondary | ICD-10-CM | POA: Diagnosis not present

## 2018-11-20 DIAGNOSIS — I951 Orthostatic hypotension: Secondary | ICD-10-CM | POA: Diagnosis not present

## 2018-11-20 DIAGNOSIS — Z86010 Personal history of colonic polyps: Secondary | ICD-10-CM | POA: Diagnosis not present

## 2018-11-20 DIAGNOSIS — Z951 Presence of aortocoronary bypass graft: Secondary | ICD-10-CM | POA: Diagnosis not present

## 2018-11-20 DIAGNOSIS — E785 Hyperlipidemia, unspecified: Secondary | ICD-10-CM | POA: Diagnosis not present

## 2018-11-20 DIAGNOSIS — E1159 Type 2 diabetes mellitus with other circulatory complications: Secondary | ICD-10-CM | POA: Diagnosis not present

## 2018-11-20 DIAGNOSIS — Z7982 Long term (current) use of aspirin: Secondary | ICD-10-CM | POA: Diagnosis not present

## 2018-11-20 DIAGNOSIS — I252 Old myocardial infarction: Secondary | ICD-10-CM | POA: Diagnosis not present

## 2018-11-22 NOTE — Discharge Summary (Signed)
[facility] [employer] -  at [facility]   PATIENT NAME: [name]    MR#:  [id number]  DATE OF BIRTH:  July 09, 1956  DATE OF ADMISSION:  11/02/2018 ADMITTING PHYSICIAN: Alford Highland, MD  DATE OF DISCHARGE: 11/07/2018  3:00 PM  PRIMARY CARE PHYSICIAN: Dorothey Baseman, MD   ADMISSION DIAGNOSIS:  Hallucinations [R44.3] Chest pain, unspecified type [R07.9]  DISCHARGE DIAGNOSIS:  Principal Problem:   Acute delirium Active Problems:   Acute encephalopathy   SECONDARY DIAGNOSIS:   Past Medical History:  Diagnosis Date  . Anxiety   . Atherosclerosis of coronary artery bypass graft with angina pectoris (HCC)   . Barrett's esophagus   . Benign enlargement of prostate   . Chronic constipation   . Colon polyp   . Coronary artery disease   . Depression   . GERD (gastroesophageal reflux disease)   . H/O esophageal spasm   . Hyperlipidemia   . Hypertension   . MI (myocardial infarction) (HCC) 2011 & 2014   x 2  . Sleep apnea     after weight loss no need for CPAP  . Type 2 diabetes mellitus (HCC)      ADMITTING HISTORY  HISTORY OF PRESENT ILLNESS:  [name]  is a 63 y.o. male coming back to the ER with hallucinations.  The patient had a right total knee replacement about a week and a half ago.  They were prescribing hydrocodone for him and he states he has been having hallucinations.  A few examples include seeing an Army of people dressed in Christmas uniforms, speaking to people that are not not there.  Trying to talk to the dog when the dog was not there, hearing radio and Bell's.  He is also having some chest discomfort pinpoint on his left lower chest.  Worse with movement.  He did associated with shortness of breath of this morning.  When he is working with the physical therapist yesterday he did get short of breath and was hallucinating so they brought him to the emergency room.  The patient is answering all questions appropriately currently.   Hospitalist services were contacted for evaluation.  HOSPITAL COURSE:   1. Hallucinations with acute encephalopathy. secondary to medications started for pain after his knee surgery.resolved was seen by psychiatry.  Started on risperidone and Zyprexa in the hospital and with improvement in symptoms that have been stopped. 2. Recent right knee replacement. Change pain control to Tylenol and PRN Toradol for right now. 3. Hypokalemia Replaced through oral and IV 4. Chest pain.  Cardiac enzymes and EKG negative.  Chronic pain since his CABG.  No change. 5. Thyroid nodules and some superior mediastinal lymph nodes. Check thyroid function - within range. Will need outpatient ultrasound of the thyroid. 6. Type 2 diabetes mellitus put on sliding scale. Resume home meds at discharge 7. Hypertension continue usual medications 8. Hyperlipidemia unspecified on Lipitor 9. Anxiety depression continue psychiatric medications- call psych consult. 10. BPH on Flomax 11. Obesity with a BMI of 31.93. Weight loss needed  Stable for discharge  CONSULTS OBTAINED:  Treatment Team:  Clapacs, Jackquline Denmark, MD  DRUG ALLERGIES:   Allergies  Allergen Reactions  . Oxycodone Other (See Comments)    Hallucinations    DISCHARGE MEDICATIONS:   Allergies as of 11/07/2018      Reactions   Oxycodone Other (See Comments)   Hallucinations      Medication List    STOP taking these medications   buPROPion 150 MG 12  hr tablet Commonly known as:  WELLBUTRIN SR     TAKE these medications   acetaminophen 325 MG tablet Commonly known as:  TYLENOL Take 650 mg by mouth every 6 (six) hours as needed (for pain/headaches.). Notes to patient:  TAKE AS NEEDED   albuterol 108 (90 Base) MCG/ACT inhaler Commonly known as:  PROVENTIL HFA;VENTOLIN HFA Inhale into the lungs every 6 (six) hours as needed for wheezing or shortness of breath. Notes to patient:  TAKE AS NEEDED   aspirin EC 81 MG tablet Take 81  mg by mouth daily.   atorvastatin 40 MG tablet Commonly known as:  LIPITOR Take 40 mg by mouth at bedtime.   cetirizine 10 MG tablet Commonly known as:  ZYRTEC Take 10 mg by mouth at bedtime.   diphenhydrAMINE 50 MG capsule Commonly known as:  BENADRYL Take 50 mg by mouth at bedtime as needed for sleep. Notes to patient:  TAKE AS NEEDED FOR SLEEP   enoxaparin 40 MG/0.4ML injection Commonly known as:  LOVENOX Inject 0.4 mLs (40 mg total) into the skin daily.   ezetimibe 10 MG tablet Commonly known as:  ZETIA Take 1 tablet (10 mg total) by mouth daily.   fluticasone-salmeterol 115-21 MCG/ACT inhaler Commonly known as:  ADVAIR HFA Inhale 2 puffs into the lungs 2 (two) times daily. Notes to patient:  RESUME AS NORMAL   metFORMIN 850 MG tablet Commonly known as:  GLUCOPHAGE Take 850 mg by mouth daily with breakfast. Notes to patient:  RESUME AS NORMAL   midodrine 10 MG tablet Commonly known as:  PROAMATINE TAKE 1 TABLET (10 MG TOTAL) BY MOUTH 4 (FOUR) TIMES DAILY - AFTER MEALS AND AT BEDTIME. What changed:    how much to take  when to take this   nitroGLYCERIN 0.4 MG SL tablet Commonly known as:  NITROSTAT Place 1 tablet (0.4 mg total) under the tongue every 5 (five) minutes as needed for chest pain. Notes to patient:  AS NEEDED FOR CHEST PAIN   RABEprazole 20 MG tablet Commonly known as:  ACIPHEX Take 20 mg by mouth at bedtime.   tamsulosin 0.4 MG Caps capsule Commonly known as:  FLOMAX Take 0.4 mg by mouth at bedtime.   venlafaxine XR 75 MG 24 hr capsule Commonly known as:  EFFEXOR-XR Take 150 mg by mouth daily.       Today   VITAL SIGNS:  Blood pressure 131/75, pulse 78, temperature 98.1 F (36.7 C), temperature source Oral, resp. rate 20, height 5\' 8"  (1.727 m), weight 95.3 kg, SpO2 97 %.  I/O:  No intake or output data in the 24 hours ending 11/22/18 1403  PHYSICAL EXAMINATION:  Physical Exam  GENERAL:  63 y.o.-year-old patient lying in the  bed with no acute distress.  LUNGS: Normal breath sounds bilaterally, no wheezing, rales,rhonchi or crepitation. No use of accessory muscles of respiration.  CARDIOVASCULAR: S1, S2 normal. No murmurs, rubs, or gallops.  ABDOMEN: Soft, non-tender, non-distended. Bowel sounds present. No organomegaly or mass.  NEUROLOGIC: Moves all 4 extremities. PSYCHIATRIC: The patient is alert and oriented x 3.  SKIN: No obvious rash, lesion, or ulcer.   DATA REVIEW:   CBC No results for input(s): WBC, HGB, HCT, PLT in the last 168 hours.  Chemistries  No results for input(s): NA, K, CL, CO2, GLUCOSE, BUN, CREATININE, CALCIUM, MG, AST, ALT, ALKPHOS, BILITOT in the last 168 hours.  Invalid input(s): GFRCGP  Cardiac Enzymes No results for input(s): TROPONINI in the last 168 hours.  Microbiology Results  Results for orders placed or performed during the hospital encounter of 11/01/18  Culture, blood (Routine x 2)     Status: None   Collection Time: 11/01/18  3:10 PM  Result Value Ref Range Status   Specimen Description BLOOD LEFT ANTECUBITAL  Final   Special Requests   Final    BOTTLES DRAWN AEROBIC AND ANAEROBIC Blood Culture results may not be optimal due to an excessive volume of blood received in culture bottles   Culture   Final    NO GROWTH 5 DAYS Performed at [facility], [address] [place], [place] [address]    Report Status 11/06/2018 FINAL  Final  Culture, blood (Routine x 2)     Status: None   Collection Time: 11/01/18  3:13 PM  Result Value Ref Range Status   Specimen Description BLOOD LEFT ANTECUBITAL  Final   Special Requests   Final    BOTTLES DRAWN AEROBIC AND ANAEROBIC Blood Culture adequate volume   Culture   Final    NO GROWTH 5 DAYS Performed at [facility], [address] [place], [place] [address]    Report Status 11/06/2018 FINAL  Final  Urine culture     Status: Abnormal   Collection Time: 11/01/18  6:42 PM  Result Value Ref Range Status    Specimen Description   Final    URINE, RANDOM Performed at [facility], [address] [place], [place] [address]    Special Requests   Final    NONE Performed at [facility], [address] [place], [place] [address]    Culture (A)  Final    <10,000 COLONIES/mL INSIGNIFICANT GROWTH Performed at [facility], [address] [place], [place] [address]    Report Status 11/03/2018 FINAL  Final    RADIOLOGY:  No results found.  Follow up with PCP in 1 week.  Management plans discussed with the patient, family and they are in agreement.  CODE STATUS:  Code Status History    Date Active Date Inactive Code Status Order ID Comments User Context   11/02/2018 1330 11/07/2018 1801 DNR [id number]  Alford HighlandWieting, Richard, MD ED   10/23/2018 1814 10/25/2018 1703 Full Code [id number]  Poggi, Excell SeltzerJohn J, MD Inpatient   07/19/2017 1117 07/21/2017 1728 Full Code [id number]  Poggi, Excell SeltzerJohn J, MD Inpatient    Questions for Most Recent Historical Code Status (Order [id number])    Question Answer Comment   In the event of cardiac or respiratory ARREST Do not call a "code blue"    In the event of cardiac or respiratory ARREST Do not perform Intubation, CPR, defibrillation or ACLS    In the event of cardiac or respiratory ARREST Use medication by any route, position, wound care, and other measures to relive pain and suffering. May use oxygen, suction and manual treatment of airway obstruction as needed for comfort.    Comments nurse may pronounce       TOTAL TIME TAKING CARE OF THIS PATIENT ON DAY OF DISCHARGE: more than 30 minutes.   Molinda BailiffSrikar R Kamri Gotsch M.D on 11/22/2018 at 2:03 PM  Between 7am to 6pm - Pager - [phone]  After 6pm go to [email or web address] - password [email or web address]  [facility] [place] [facility]  Office  [phone]  CC: Primary care physician; Dorothey BasemanBronstein, David, MD  Note: This dictation was prepared with Dragon dictation along with smaller phrase technology. Any  transcriptional errors that result from this process are unintentional.

## 2018-11-23 ENCOUNTER — Other Ambulatory Visit: Payer: Self-pay

## 2018-11-23 NOTE — Patient Outreach (Signed)
[facility]) Care Management  11/23/2018  HORUS BELLOFATTO Dec 02, 1955 [id number]    EMMI-General discharge FOLLOW-UP call  RED ON EMMI ALERT Day # 4 Date:10/30/18 11:26 AM Red Alert Reason: "Lost interest in things? Yes", Sad/hopeless/anxious/empty? Yes"  Outreach FOLLOW-UP attempt # 1, unsuccessful. No answer and unable to leave voicemail message.   Plan: RN CM will make outreach attempt to patient within 3-4 business days. RN CM will send unsuccessful outreach letter to patient.     Delsa Sale, RN,CCM [facility] Care Management Care Management Coordinator Direct Phone: [phone] Toll Free: [phone] Fax: [phone]

## 2018-11-24 ENCOUNTER — Other Ambulatory Visit: Payer: Self-pay

## 2018-11-24 NOTE — Patient Outreach (Signed)
[facility]) Care Management  11/24/2018  [name] 11-07-1956 [id number]    EMMI-General discharge #2 FOLLOW-UP attempt  RED ON EMMI ALERT Day #4 Date:10/30/18 11:26 AM Red Alert Reason:"Lost interest in things? Yes", Sad/hopeless/anxious/empty? Yes"  Outreach FOLLOW-UP attempt # 2, unsuccessful. HIPAA compliant voicemail left with contact information.     Plan: RN CM will make final outreach attempt to patient within 3-4 business days.    Delsa Sale, RN,CCM [facility] Care Management Care Management Coordinator Direct Phone: [phone] Toll Free: [phone] Fax: [phone]

## 2018-11-27 DIAGNOSIS — M25661 Stiffness of right knee, not elsewhere classified: Secondary | ICD-10-CM | POA: Diagnosis not present

## 2018-11-27 DIAGNOSIS — Z96651 Presence of right artificial knee joint: Secondary | ICD-10-CM | POA: Diagnosis not present

## 2018-11-27 DIAGNOSIS — M6281 Muscle weakness (generalized): Secondary | ICD-10-CM | POA: Diagnosis not present

## 2018-11-27 DIAGNOSIS — G8929 Other chronic pain: Secondary | ICD-10-CM | POA: Diagnosis not present

## 2018-11-27 DIAGNOSIS — M25561 Pain in right knee: Secondary | ICD-10-CM | POA: Diagnosis not present

## 2018-11-30 ENCOUNTER — Other Ambulatory Visit: Payer: Self-pay

## 2018-11-30 NOTE — Patient Outreach (Signed)
[facility]) Care Management  11/30/2018  [name] 1956-06-20 [id number]   EMMI-General discharge #3 FOLLOW-UP attempt  RED ON EMMI ALERT Day #4 Date:10/30/18 11:26 AM Red Alert Reason:"Lost interest in things? Yes", Sad/hopeless/anxious/empty? Yes"  Outreach FOLLOW-UP attempt # 3, unsuccessful. No answer and unable to leave voicemail message. Multiple attempts to establish contact with patient without success. No response from letter mailed to patient. Case is being closed at this time.    Plan:  RN CM will close case due to being unable to reach the patient/wife after three attempts and no return call from the patient/wife has been received.      Delsa Sale, RN,CCM [facility] Care Management Care Management Coordinator Direct Phone: [phone] Toll Free: [phone] Fax: [phone]

## 2018-12-01 DIAGNOSIS — M6281 Muscle weakness (generalized): Secondary | ICD-10-CM | POA: Diagnosis not present

## 2018-12-01 DIAGNOSIS — M25561 Pain in right knee: Secondary | ICD-10-CM | POA: Diagnosis not present

## 2018-12-01 DIAGNOSIS — M25661 Stiffness of right knee, not elsewhere classified: Secondary | ICD-10-CM | POA: Diagnosis not present

## 2018-12-01 DIAGNOSIS — Z96651 Presence of right artificial knee joint: Secondary | ICD-10-CM | POA: Diagnosis not present

## 2018-12-01 DIAGNOSIS — G8929 Other chronic pain: Secondary | ICD-10-CM | POA: Diagnosis not present

## 2018-12-06 DIAGNOSIS — M25661 Stiffness of right knee, not elsewhere classified: Secondary | ICD-10-CM | POA: Diagnosis not present

## 2018-12-06 DIAGNOSIS — M6281 Muscle weakness (generalized): Secondary | ICD-10-CM | POA: Diagnosis not present

## 2018-12-06 DIAGNOSIS — G8929 Other chronic pain: Secondary | ICD-10-CM | POA: Diagnosis not present

## 2018-12-06 DIAGNOSIS — M25561 Pain in right knee: Secondary | ICD-10-CM | POA: Diagnosis not present

## 2018-12-06 DIAGNOSIS — Z96651 Presence of right artificial knee joint: Secondary | ICD-10-CM | POA: Diagnosis not present

## 2018-12-08 DIAGNOSIS — M1712 Unilateral primary osteoarthritis, left knee: Secondary | ICD-10-CM | POA: Diagnosis not present

## 2018-12-08 DIAGNOSIS — Z96651 Presence of right artificial knee joint: Secondary | ICD-10-CM | POA: Diagnosis not present

## 2018-12-22 DIAGNOSIS — Z8371 Family history of colonic polyps: Secondary | ICD-10-CM | POA: Diagnosis not present

## 2018-12-22 DIAGNOSIS — Z8719 Personal history of other diseases of the digestive system: Secondary | ICD-10-CM | POA: Diagnosis not present

## 2018-12-22 DIAGNOSIS — Z951 Presence of aortocoronary bypass graft: Secondary | ICD-10-CM | POA: Diagnosis not present

## 2018-12-22 DIAGNOSIS — I480 Paroxysmal atrial fibrillation: Secondary | ICD-10-CM | POA: Diagnosis not present

## 2018-12-22 DIAGNOSIS — I25708 Atherosclerosis of coronary artery bypass graft(s), unspecified, with other forms of angina pectoris: Secondary | ICD-10-CM | POA: Diagnosis not present

## 2018-12-22 DIAGNOSIS — I10 Essential (primary) hypertension: Secondary | ICD-10-CM | POA: Diagnosis not present

## 2019-01-03 DIAGNOSIS — I10 Essential (primary) hypertension: Secondary | ICD-10-CM | POA: Diagnosis not present

## 2019-01-03 DIAGNOSIS — E785 Hyperlipidemia, unspecified: Secondary | ICD-10-CM | POA: Diagnosis not present

## 2019-01-03 DIAGNOSIS — Z0000 Encounter for general adult medical examination without abnormal findings: Secondary | ICD-10-CM | POA: Diagnosis not present

## 2019-01-03 DIAGNOSIS — N401 Enlarged prostate with lower urinary tract symptoms: Secondary | ICD-10-CM | POA: Diagnosis not present

## 2019-01-03 DIAGNOSIS — I25708 Atherosclerosis of coronary artery bypass graft(s), unspecified, with other forms of angina pectoris: Secondary | ICD-10-CM | POA: Diagnosis not present

## 2019-01-03 DIAGNOSIS — K219 Gastro-esophageal reflux disease without esophagitis: Secondary | ICD-10-CM | POA: Diagnosis not present

## 2019-01-03 DIAGNOSIS — E119 Type 2 diabetes mellitus without complications: Secondary | ICD-10-CM | POA: Diagnosis not present

## 2019-01-26 DIAGNOSIS — Z96651 Presence of right artificial knee joint: Secondary | ICD-10-CM | POA: Diagnosis not present

## 2019-02-06 ENCOUNTER — Ambulatory Visit: Admission: RE | Admit: 2019-02-06 | Payer: PPO | Source: Home / Self Care | Admitting: Internal Medicine

## 2019-02-06 ENCOUNTER — Encounter: Admission: RE | Payer: Self-pay | Source: Home / Self Care

## 2019-02-06 SURGERY — COLONOSCOPY WITH PROPOFOL
Anesthesia: General

## 2019-03-07 DIAGNOSIS — E119 Type 2 diabetes mellitus without complications: Secondary | ICD-10-CM | POA: Diagnosis not present

## 2019-03-07 DIAGNOSIS — I10 Essential (primary) hypertension: Secondary | ICD-10-CM | POA: Diagnosis not present

## 2019-03-07 DIAGNOSIS — E785 Hyperlipidemia, unspecified: Secondary | ICD-10-CM | POA: Diagnosis not present

## 2019-03-07 DIAGNOSIS — M1712 Unilateral primary osteoarthritis, left knee: Secondary | ICD-10-CM | POA: Diagnosis not present

## 2019-03-07 DIAGNOSIS — I25708 Atherosclerosis of coronary artery bypass graft(s), unspecified, with other forms of angina pectoris: Secondary | ICD-10-CM | POA: Diagnosis not present

## 2019-04-03 ENCOUNTER — Ambulatory Visit: Admit: 2019-04-03 | Payer: PPO | Admitting: Internal Medicine

## 2019-04-03 SURGERY — COLONOSCOPY WITH PROPOFOL
Anesthesia: General

## 2019-04-24 DIAGNOSIS — E119 Type 2 diabetes mellitus without complications: Secondary | ICD-10-CM | POA: Diagnosis not present

## 2019-05-29 DIAGNOSIS — N401 Enlarged prostate with lower urinary tract symptoms: Secondary | ICD-10-CM | POA: Diagnosis not present

## 2019-05-29 DIAGNOSIS — R351 Nocturia: Secondary | ICD-10-CM | POA: Diagnosis not present

## 2019-06-04 DIAGNOSIS — R3914 Feeling of incomplete bladder emptying: Secondary | ICD-10-CM | POA: Diagnosis not present

## 2019-06-04 DIAGNOSIS — N401 Enlarged prostate with lower urinary tract symptoms: Secondary | ICD-10-CM | POA: Diagnosis not present

## 2019-06-07 DIAGNOSIS — N401 Enlarged prostate with lower urinary tract symptoms: Secondary | ICD-10-CM | POA: Diagnosis not present

## 2019-06-07 DIAGNOSIS — R351 Nocturia: Secondary | ICD-10-CM | POA: Diagnosis not present

## 2019-06-07 DIAGNOSIS — R350 Frequency of micturition: Secondary | ICD-10-CM | POA: Diagnosis not present

## 2019-06-07 DIAGNOSIS — R3914 Feeling of incomplete bladder emptying: Secondary | ICD-10-CM | POA: Diagnosis not present

## 2019-06-15 ENCOUNTER — Other Ambulatory Visit
Admission: RE | Admit: 2019-06-15 | Discharge: 2019-06-15 | Disposition: A | Discharge status: Home / Self Care | Payer: PPO | Source: Ambulatory Visit | Attending: Internal Medicine | Admitting: Internal Medicine

## 2019-06-15 ENCOUNTER — Other Ambulatory Visit: Payer: Self-pay

## 2019-06-15 DIAGNOSIS — Z1159 Encounter for screening for other viral diseases: Secondary | ICD-10-CM | POA: Diagnosis not present

## 2019-06-16 LAB — SARS CORONAVIRUS 2 (TAT 6-24 HRS): SARS Coronavirus 2: NEGATIVE

## 2019-06-20 ENCOUNTER — Ambulatory Visit: Payer: PPO | Admitting: Anesthesiology

## 2019-06-20 ENCOUNTER — Encounter: Payer: Self-pay | Admitting: Anesthesiology

## 2019-06-20 ENCOUNTER — Encounter
Admission: RE | Disposition: A | Discharge status: Home / Self Care | Payer: Self-pay | Source: Home / Self Care | Attending: Internal Medicine

## 2019-06-20 ENCOUNTER — Other Ambulatory Visit: Payer: Self-pay

## 2019-06-20 ENCOUNTER — Ambulatory Visit
Admission: RE | Admit: 2019-06-20 | Discharge: 2019-06-20 | Disposition: A | Discharge status: Home / Self Care | Payer: PPO | Attending: Internal Medicine | Admitting: Internal Medicine

## 2019-06-20 DIAGNOSIS — Z7982 Long term (current) use of aspirin: Secondary | ICD-10-CM | POA: Insufficient documentation

## 2019-06-20 DIAGNOSIS — Z951 Presence of aortocoronary bypass graft: Secondary | ICD-10-CM | POA: Insufficient documentation

## 2019-06-20 DIAGNOSIS — Z86010 Personal history of colonic polyps: Secondary | ICD-10-CM | POA: Insufficient documentation

## 2019-06-20 DIAGNOSIS — Z79899 Other long term (current) drug therapy: Secondary | ICD-10-CM | POA: Diagnosis not present

## 2019-06-20 DIAGNOSIS — F419 Anxiety disorder, unspecified: Secondary | ICD-10-CM | POA: Diagnosis not present

## 2019-06-20 DIAGNOSIS — Z1211 Encounter for screening for malignant neoplasm of colon: Secondary | ICD-10-CM | POA: Diagnosis not present

## 2019-06-20 DIAGNOSIS — I10 Essential (primary) hypertension: Secondary | ICD-10-CM | POA: Insufficient documentation

## 2019-06-20 DIAGNOSIS — K640 First degree hemorrhoids: Secondary | ICD-10-CM | POA: Diagnosis not present

## 2019-06-20 DIAGNOSIS — K210 Gastro-esophageal reflux disease with esophagitis: Secondary | ICD-10-CM | POA: Diagnosis not present

## 2019-06-20 DIAGNOSIS — Z7901 Long term (current) use of anticoagulants: Secondary | ICD-10-CM | POA: Diagnosis not present

## 2019-06-20 DIAGNOSIS — K3184 Gastroparesis: Secondary | ICD-10-CM | POA: Insufficient documentation

## 2019-06-20 DIAGNOSIS — K228 Other specified diseases of esophagus: Secondary | ICD-10-CM | POA: Diagnosis not present

## 2019-06-20 DIAGNOSIS — E785 Hyperlipidemia, unspecified: Secondary | ICD-10-CM | POA: Insufficient documentation

## 2019-06-20 DIAGNOSIS — E118 Type 2 diabetes mellitus with unspecified complications: Secondary | ICD-10-CM | POA: Insufficient documentation

## 2019-06-20 DIAGNOSIS — K648 Other hemorrhoids: Secondary | ICD-10-CM | POA: Diagnosis not present

## 2019-06-20 DIAGNOSIS — Z8719 Personal history of other diseases of the digestive system: Secondary | ICD-10-CM | POA: Diagnosis not present

## 2019-06-20 DIAGNOSIS — Z7984 Long term (current) use of oral hypoglycemic drugs: Secondary | ICD-10-CM | POA: Insufficient documentation

## 2019-06-20 DIAGNOSIS — K5909 Other constipation: Secondary | ICD-10-CM | POA: Insufficient documentation

## 2019-06-20 DIAGNOSIS — K219 Gastro-esophageal reflux disease without esophagitis: Secondary | ICD-10-CM | POA: Diagnosis not present

## 2019-06-20 DIAGNOSIS — I252 Old myocardial infarction: Secondary | ICD-10-CM | POA: Insufficient documentation

## 2019-06-20 DIAGNOSIS — F329 Major depressive disorder, single episode, unspecified: Secondary | ICD-10-CM | POA: Insufficient documentation

## 2019-06-20 DIAGNOSIS — K5730 Diverticulosis of large intestine without perforation or abscess without bleeding: Secondary | ICD-10-CM | POA: Insufficient documentation

## 2019-06-20 DIAGNOSIS — G4730 Sleep apnea, unspecified: Secondary | ICD-10-CM | POA: Diagnosis not present

## 2019-06-20 DIAGNOSIS — I25709 Atherosclerosis of coronary artery bypass graft(s), unspecified, with unspecified angina pectoris: Secondary | ICD-10-CM | POA: Diagnosis not present

## 2019-06-20 DIAGNOSIS — K5790 Diverticulosis of intestine, part unspecified, without perforation or abscess without bleeding: Secondary | ICD-10-CM | POA: Diagnosis not present

## 2019-06-20 DIAGNOSIS — K2270 Barrett's esophagus without dysplasia: Secondary | ICD-10-CM | POA: Insufficient documentation

## 2019-06-20 DIAGNOSIS — E1143 Type 2 diabetes mellitus with diabetic autonomic (poly)neuropathy: Secondary | ICD-10-CM | POA: Diagnosis not present

## 2019-06-20 DIAGNOSIS — Z8371 Family history of colonic polyps: Secondary | ICD-10-CM | POA: Diagnosis not present

## 2019-06-20 HISTORY — PX: ESOPHAGOGASTRODUODENOSCOPY (EGD) WITH PROPOFOL: SHX5813

## 2019-06-20 HISTORY — PX: COLONOSCOPY WITH PROPOFOL: SHX5780

## 2019-06-20 SURGERY — ESOPHAGOGASTRODUODENOSCOPY (EGD) WITH PROPOFOL
Anesthesia: General

## 2019-06-20 MED ORDER — LIDOCAINE HCL (CARDIAC) PF 100 MG/5ML IV SOSY
PREFILLED_SYRINGE | INTRAVENOUS | Status: DC | PRN
  Administered 2019-06-20: 100 mg via INTRAVENOUS

## 2019-06-20 MED ORDER — GLYCOPYRROLATE 0.2 MG/ML IJ SOLN
INTRAMUSCULAR | Status: AC
  Filled 2019-06-20: qty 1

## 2019-06-20 MED ORDER — PROPOFOL 10 MG/ML IV BOLUS
INTRAVENOUS | Status: DC | PRN
  Administered 2019-06-20 (×2): 20 mg via INTRAVENOUS
  Administered 2019-06-20: 60 mg via INTRAVENOUS

## 2019-06-20 MED ORDER — SODIUM CHLORIDE 0.9 % IV SOLN
INTRAVENOUS | Status: DC
  Administered 2019-06-20: 08:00:00 via INTRAVENOUS

## 2019-06-20 MED ORDER — LIDOCAINE HCL (PF) 2 % IJ SOLN
INTRAMUSCULAR | Status: AC
  Filled 2019-06-20: qty 10

## 2019-06-20 MED ORDER — PROPOFOL 10 MG/ML IV BOLUS
INTRAVENOUS | Status: AC
  Filled 2019-06-20: qty 20

## 2019-06-20 MED ORDER — PROPOFOL 500 MG/50ML IV EMUL
INTRAVENOUS | Status: DC | PRN
  Administered 2019-06-20: 100 ug/kg/min via INTRAVENOUS

## 2019-06-20 MED ORDER — PROPOFOL 500 MG/50ML IV EMUL
INTRAVENOUS | Status: AC
  Filled 2019-06-20: qty 50

## 2019-06-20 NOTE — H&P (Signed)
Outpatient short stay form Pre-procedure 06/20/2019 8:06 AM [name] [name], M.D.  Primary Physician: [name], M.D.  Reason for visit:  Hx Barrett's esophagus,  Family hx of colon polyps  History of present illness:                            Patient presents for colonoscopy for a personal hx of colon polyps. The patient denies abdominal pain, abnormal weight loss or rectal bleeding.  Patient also has a hx of Barrett's esophagus without dysplasia, last Egd being in 2011. Denies dysphagia, hemetemesis, weight loss.     Current Facility-Administered Medications:  .  0.9 %  sodium chloride infusion, , Intravenous, Continuous, Kaeleb Emond, Benay Pike, MD  Medications Prior to Admission  Medication Sig Dispense Refill Last Dose  . atorvastatin (LIPITOR) 40 MG tablet Take 40 mg by mouth at bedtime.    06/20/2019 at 0530  . cyclobenzaprine (FLEXERIL) 10 MG tablet Take 10 mg by mouth 3 (three) times daily as needed for muscle spasms.     Marland Kitchen doxepin (SINEQUAN) 10 MG capsule Take 10 mg by mouth.     Marland Kitchen ibuprofen (ADVIL) 200 MG tablet Take 200 mg by mouth every 6 (six) hours as needed.     . midodrine (PROAMATINE) 10 MG tablet TAKE 1 TABLET (10 MG TOTAL) BY MOUTH 4 (FOUR) TIMES DAILY - AFTER MEALS AND AT BEDTIME. (Patient taking differently: Take 20 mg by mouth daily. ) 120 tablet 4 06/20/2019 at 0530  . triamcinolone cream (KENALOG) 0.1 % Apply 1 application topically 2 (two) times daily.     Marland Kitchen venlafaxine XR (EFFEXOR-XR) 75 MG 24 hr capsule Take 150 mg by mouth daily.    06/20/2019 at 0530  . acetaminophen (TYLENOL) 325 MG tablet Take 650 mg by mouth every 6 (six) hours as needed (for pain/headaches.).      Marland Kitchen albuterol (PROVENTIL HFA;VENTOLIN HFA) 108 (90 BASE) MCG/ACT inhaler Inhale into the lungs every 6 (six) hours as needed for wheezing or shortness of breath.     Marland Kitchen aspirin EC 81 MG tablet Take 81 mg by mouth daily.     . cetirizine (ZYRTEC) 10 MG tablet Take 10 mg by mouth at bedtime.     .  diphenhydrAMINE (BENADRYL) 50 MG capsule Take 50 mg by mouth at bedtime as needed for sleep.     Marland Kitchen enoxaparin (LOVENOX) 40 MG/0.4ML injection Inject 0.4 mLs (40 mg total) into the skin daily. 14 Syringe 0   . ezetimibe (ZETIA) 10 MG tablet Take 1 tablet (10 mg total) by mouth daily. 30 tablet 11   . fluticasone-salmeterol (ADVAIR HFA) 115-21 MCG/ACT inhaler Inhale 2 puffs into the lungs 2 (two) times daily. 1 Inhaler 6   . metFORMIN (GLUCOPHAGE) 850 MG tablet Take 850 mg by mouth daily with breakfast.      . nitroGLYCERIN (NITROSTAT) 0.4 MG SL tablet Place 1 tablet (0.4 mg total) under the tongue every 5 (five) minutes as needed for chest pain. 25 tablet 3   . RABEprazole (ACIPHEX) 20 MG tablet Take 20 mg by mouth at bedtime.      . tamsulosin (FLOMAX) 0.4 MG CAPS capsule Take 0.4 mg by mouth at bedtime.         Allergies  Allergen Reactions  . Oxycodone Other (See Comments)    Hallucinations  . Hydrocodone     hallucinations  . Tramadol     hallucination     Past  Medical History:  Diagnosis Date  . Anxiety   . Atherosclerosis of coronary artery bypass graft with angina pectoris (HCC)   . Barrett's esophagus   . Benign enlargement of prostate   . Chronic constipation   . Colon polyp   . Coronary artery disease   . Depression   . GERD (gastroesophageal reflux disease)   . H/O esophageal spasm   . Hyperlipidemia   . Hypertension   . MI (myocardial infarction) (HCC) 2011 & 2014   x 2  . Sleep apnea     after weight loss no need for CPAP  . Type 2 diabetes mellitus (HCC)     Review of systems:  Otherwise negative.    Physical Exam  Gen: Alert, oriented. Appears stated age.  HEENT: [place]/AT. PERRLA. Lungs: CTA, no wheezes. CV: RR nl S1, S2. Abd: soft, benign, no masses. BS+ Ext: No edema. Pulses 2+    Planned procedures: Proceed with EGD and colonoscopy. The patient understands the nature of the planned procedure, indications, risks, alternatives and potential  complications including but not limited to bleeding, infection, perforation, damage to internal organs and possible oversedation/side effects from anesthesia. The patient agrees and gives consent to proceed.  Please refer to procedure notes for findings, recommendations and patient disposition/instructions.     [name] K. Norma Fredricksonoledo, M.D. Gastroenterology 06/20/2019  8:06 AM

## 2019-06-20 NOTE — Transfer of Care (Signed)
Immediate Anesthesia Transfer of Care Note  Patient: [name]  Procedure(s) Performed: ESOPHAGOGASTRODUODENOSCOPY (EGD) WITH PROPOFOL (N/A ) COLONOSCOPY WITH PROPOFOL (N/A )  Patient Location: PACU  Anesthesia Type:General  Level of Consciousness: sedated  Airway & Oxygen Therapy: Patient Spontanous Breathing and Patient connected to nasal cannula oxygen  Post-op Assessment: Report given to RN and Post -op Vital signs reviewed and unstable, Anesthesiologist notified  Post vital signs: stable  Last Vitals:  Vitals Value Taken Time  BP 119/68 06/20/19 0847  Temp 36.3 C 06/20/19 0845  Pulse 72 06/20/19 0848  Resp 21 06/20/19 0848  SpO2 97 % 06/20/19 0848  Vitals shown include unvalidated device data.  Last Pain:  Vitals:   06/20/19 0845  TempSrc: Tympanic  PainSc: Asleep         Complications: No apparent anesthesia complications

## 2019-06-20 NOTE — Anesthesia Post-op Follow-up Note (Signed)
Anesthesia QCDR form completed.        

## 2019-06-20 NOTE — Op Note (Signed)
[facility] Gastroenterology Patient Name: Silverio Decampddie Gotto Procedure Date: 06/20/2019 6:46 AM MRN: [id number] Account #: [id number] Date of Birth: 02/11/1956 Admit Type: Outpatient Age: 63 Room: [facility] ENDO ROOM 3 Gender: Male Note Status: Finalized Procedure:            Colonoscopy Indications:          Screening for colorectal malignant neoplasm Providers:            Boykin Nearingeodoro K. Rashaan Wyles MD, MD Medicines:            Propofol per Anesthesia Complications:        No immediate complications. Procedure:            Pre-Anesthesia Assessment:                       - The risks and benefits of the procedure and the                        sedation options and risks were discussed with the                        patient. All questions were answered and informed                        consent was obtained.                       - Patient identification and proposed procedure were                        verified prior to the procedure by the nurse. The                        procedure was verified in the procedure room.                       - ASA Grade Assessment: II - A patient with mild                        systemic disease.                       - ASA Grade Assessment: III - A patient with severe                        systemic disease.                       - After reviewing the risks and benefits, the patient                        was deemed in satisfactory condition to undergo the                        procedure.                       After obtaining informed consent, the colonoscope was                        passed under direct vision. Throughout the procedure,  the patient's blood pressure, pulse, and oxygen                        saturations were monitored continuously. The                        Colonoscope was introduced through the anus and                        advanced to the the cecum, identified by appendiceal   orifice and ileocecal valve. The colonoscopy was                        performed without difficulty. The patient tolerated the                        procedure well. The quality of the bowel preparation                        was good except the sigmoid colon was fair. Findings:      The perianal and digital rectal examinations were normal. Pertinent       negatives include normal sphincter tone and no palpable rectal lesions.      Multiple small and large-mouthed diverticula were found in the sigmoid       colon.      Non-bleeding internal hemorrhoids were found during retroflexion. The       hemorrhoids were Grade I (internal hemorrhoids that do not prolapse).      The exam was otherwise without abnormality. Impression:           - Diverticulosis in the sigmoid colon.                       - Non-bleeding internal hemorrhoids.                       - The examination was otherwise normal.                       - No specimens collected. Recommendation:       - Await pathology results from EGD, also performed                        today.                       - Patient has a contact number available for                        emergencies. The signs and symptoms of potential                        delayed complications were discussed with the patient.                        Return to normal activities tomorrow. Written discharge                        instructions were provided to the patient.                       - Resume previous  diet.                       - Continue present medications.                       - No repeat colonoscopy due to current age (65 years or                        older) and the absence of advanced adenomas. Procedure Code(s):    --- Professional ---                       Q7341, Colorectal cancer screening; colonoscopy on                        individual not meeting criteria for high risk Diagnosis Code(s):    --- Professional ---                        K57.30, Diverticulosis of large intestine without                        perforation or abscess without bleeding                       K64.0, First degree hemorrhoids                       Z12.11, Encounter for screening for malignant neoplasm                        of colon CPT copyright 2019 American Medical [employer]. All rights reserved. The codes documented in this report are preliminary and upon coder review may  be revised to meet current compliance requirements. Efrain Sella MD, MD 06/20/2019 8:46:38 AM This report has been signed electronically. Number of Addenda: 0 Note Initiated On: 06/20/2019 6:46 AM Scope Withdrawal Time: 0 hours 6 minutes 22 seconds  Total Procedure Duration: 0 hours 10 minutes 58 seconds  Estimated Blood Loss: Estimated blood loss: none.      [facility]

## 2019-06-20 NOTE — Anesthesia Postprocedure Evaluation (Signed)
Anesthesia Post Note  Patient: [name]  Procedure(s) Performed: ESOPHAGOGASTRODUODENOSCOPY (EGD) WITH PROPOFOL (N/A ) COLONOSCOPY WITH PROPOFOL (N/A )  Patient location during evaluation: Endoscopy Anesthesia Type: General Level of consciousness: awake and alert Pain management: pain level controlled Vital Signs Assessment: post-procedure vital signs reviewed and stable Respiratory status: spontaneous breathing, nonlabored ventilation, respiratory function stable and patient connected to nasal cannula oxygen Cardiovascular status: blood pressure returned to baseline and stable Postop Assessment: no apparent nausea or vomiting Anesthetic complications: no     Last Vitals:  Vitals:   06/20/19 0857 06/20/19 0917  BP: 115/71 121/77  Pulse:    Resp:    Temp:    SpO2:      Last Pain:  Vitals:   06/20/19 0917  TempSrc:   PainSc: 0-No pain                 Martha Clan

## 2019-06-20 NOTE — Op Note (Signed)
[facility] Gastroenterology Patient Name: [name] Procedure Date: 06/20/2019 7:31 AM MRN: [id number] Account #: [id number] Date of Birth: 04/30/1956 Admit Type: Outpatient Age: [age] [facility]OM 3 Gender: Male Note Status: Finalized Procedure:            Upper GI endoscopy Indications:          Surveillance for malignancy due to personal history of                        Barrett's esophagus Providers:            Boykin Nearingeodoro K. Treysean Petruzzi MD, MD Medicines:            Propofol per Anesthesia Complications:        No immediate complications. Procedure:            Pre-Anesthesia Assessment:                       - The risks and benefits of the procedure and the                        sedation options and risks were discussed with the                        patient. All questions were answered and informed                        consent was obtained.                       - Patient identification and proposed procedure were                        verified prior to the procedure by the nurse. The                        procedure was verified in the procedure room.                       - ASA Grade Assessment: III - A patient with severe                        systemic disease.                       - After reviewing the risks and benefits, the patient                        was deemed in satisfactory condition to undergo the                        procedure.                       After obtaining informed consent, the endoscope was                        passed under direct vision. Throughout the procedure,                        the patient's blood pressure, pulse, and oxygen  saturations were monitored continuously. The Endoscope                        was introduced through the mouth, and advanced to the                        third part of duodenum. The upper GI endoscopy was                        accomplished without difficulty. The  patient tolerated                        the procedure well. Findings:      There were esophageal mucosal changes secondary to established Barrett's       disease present in the distal esophagus. The maximum longitudinal extent       of these mucosal changes was 0.5 cm in length. Mucosa was biopsied with       a cold forceps for histology in 4 quadrants at intervals of 0.5 cm at       the gastroesophageal junction. One specimen bottle was sent to pathology.      A medium amount of food (residue) was found in the entire examined       stomach.      The examined duodenum was normal.      The exam was otherwise without abnormality. Impression:           - Esophageal mucosal changes secondary to established                        Barrett's disease. Biopsied.                       - A medium amount of food (residue) in the stomach.                       - Normal examined duodenum.                       - The examination was otherwise normal. Recommendation:       - Await pathology results.                       - Proceed with colonoscopy Procedure Code(s):    --- Professional ---                       [phone], Esophagogastroduodenoscopy, flexible, transoral;                        with biopsy, single or multiple Diagnosis Code(s):    --- Professional ---                       K22.70, Barrett's esophagus without dysplasia CPT copyright 2019 American Medical Association. All rights reserved. The codes documented in this report are preliminary and upon coder review may  be revised to meet current compliance requirements. Efrain Sella MD, MD 06/20/2019 8:30:05 AM This report has been signed electronically. Number of Addenda: 0 Note Initiated On: 06/20/2019 7:31 AM Estimated Blood Loss: Estimated blood loss: none.      [facility]

## 2019-06-20 NOTE — Anesthesia Preprocedure Evaluation (Addendum)
Anesthesia Evaluation  Patient identified by MRN, date of birth, ID band Patient awake    Reviewed: Allergy & Precautions, NPO status , Patient's Chart, lab work & pertinent test results  History of Anesthesia Complications Negative for: history of anesthetic complications  Airway Mallampati: II  TM Distance: >3 FB Neck ROM: Full    Dental  (+) Missing, Poor Dentition   Pulmonary neg shortness of breath, sleep apnea and Continuous Positive Airway Pressure Ventilation , neg COPD, neg recent URI,    Pulmonary exam normal        Cardiovascular hypertension, (-) angina+ CAD, + Past MI, + Cardiac Stents (all prior to CABG) and + CABG (2014)  Normal cardiovascular exam(-) dysrhythmias (-) Valvular Problems/Murmurs - Systolic murmurs Echo 01/21/34: - Left ventricle: The cavity size was normal. Systolic function was   normal. The estimated ejection fraction was in the range of 60%   to 65%. Septal wall motion abnormality consistent with   post-operative state/conduction abnormality. Features are   consistent with a pseudonormal left ventricular filling pattern,   with concomitant abnormal relaxation and increased filling   pressure (grade 2 diastolic dysfunction). - Left atrium: The atrium was mildly dilated. - Right ventricle: Systolic function was normal. - Pulmonary arteries: Systolic pressure was within the normal   range.   Neuro/Psych neg Seizures PSYCHIATRIC DISORDERS Anxiety Depression TIA   GI/Hepatic Neg liver ROS, GERD  ,  Endo/Other  diabetes, Oral Hypoglycemic Agents  Renal/GU negative Renal ROS     Musculoskeletal negative musculoskeletal ROS (+)   Abdominal (+) + obese,   Peds  Hematology negative hematology ROS (+)   Anesthesia Other Findings Past Medical History: No date: Anxiety No date: Atherosclerosis of coronary artery bypass graft with angina  pectoris (HCC) No date: Barrett's esophagus No date:  Chronic constipation No date: Colon polyp No date: Coronary artery disease No date: Depression No date: GERD (gastroesophageal reflux disease) No date: H/O esophageal spasm No date: Hyperlipidemia No date: Hypertension 2011 & 2014: MI (myocardial infarction) ([place])     Comment:  x 2 No date: Sleep apnea No date: Type 2 diabetes mellitus (HCC)   Reproductive/Obstetrics                            Anesthesia Physical  Anesthesia Plan  ASA: III  Anesthesia Plan: General   Post-op Pain Management:    Induction: Intravenous  PONV Risk Score and Plan: 2 and Propofol infusion and TIVA  Airway Management Planned: Natural Airway and Nasal Cannula  Additional Equipment:   Intra-op Plan:   Post-operative Plan:   Informed Consent: I have reviewed the patients History and Physical, chart, labs and discussed the procedure including the risks, benefits and alternatives for the proposed anesthesia with the patient or authorized representative who has indicated his/her understanding and acceptance.     Dental advisory given  Plan Discussed with: Anesthesiologist and CRNA  Anesthesia Plan Comments: (Last dose of plavix 07/13/17)        Lab Results  Component Value Date   WBC 6.1 11/07/2018   HGB 11.7 (L) 11/07/2018   HCT 37.1 (L) 11/07/2018   MCV 85.3 11/07/2018   PLT 175 11/07/2018    Anesthesia Quick Evaluation

## 2019-06-21 ENCOUNTER — Encounter: Payer: Self-pay | Admitting: Internal Medicine

## 2019-06-21 LAB — SURGICAL PATHOLOGY

## 2019-06-28 DIAGNOSIS — Z0000 Encounter for general adult medical examination without abnormal findings: Secondary | ICD-10-CM | POA: Diagnosis not present

## 2019-06-28 DIAGNOSIS — E119 Type 2 diabetes mellitus without complications: Secondary | ICD-10-CM | POA: Diagnosis not present

## 2019-06-28 DIAGNOSIS — R1310 Dysphagia, unspecified: Secondary | ICD-10-CM | POA: Diagnosis not present

## 2019-07-09 DIAGNOSIS — N401 Enlarged prostate with lower urinary tract symptoms: Secondary | ICD-10-CM | POA: Diagnosis not present

## 2019-07-09 DIAGNOSIS — I10 Essential (primary) hypertension: Secondary | ICD-10-CM | POA: Diagnosis not present

## 2019-07-09 DIAGNOSIS — E1159 Type 2 diabetes mellitus with other circulatory complications: Secondary | ICD-10-CM | POA: Diagnosis not present

## 2019-07-09 DIAGNOSIS — N138 Other obstructive and reflux uropathy: Secondary | ICD-10-CM | POA: Diagnosis not present

## 2019-07-09 DIAGNOSIS — I25708 Atherosclerosis of coronary artery bypass graft(s), unspecified, with other forms of angina pectoris: Secondary | ICD-10-CM | POA: Diagnosis not present

## 2019-07-09 DIAGNOSIS — K210 Gastro-esophageal reflux disease with esophagitis: Secondary | ICD-10-CM | POA: Diagnosis not present

## 2019-07-09 DIAGNOSIS — E785 Hyperlipidemia, unspecified: Secondary | ICD-10-CM | POA: Diagnosis not present

## 2019-07-29 ENCOUNTER — Other Ambulatory Visit: Payer: Self-pay | Admitting: Cardiovascular Disease

## 2019-08-07 DIAGNOSIS — G8929 Other chronic pain: Secondary | ICD-10-CM | POA: Diagnosis not present

## 2019-08-07 DIAGNOSIS — M1712 Unilateral primary osteoarthritis, left knee: Secondary | ICD-10-CM | POA: Diagnosis not present

## 2019-08-07 DIAGNOSIS — M25562 Pain in left knee: Secondary | ICD-10-CM | POA: Diagnosis not present

## 2019-08-07 DIAGNOSIS — M25462 Effusion, left knee: Secondary | ICD-10-CM | POA: Diagnosis not present

## 2019-08-20 DIAGNOSIS — R0602 Shortness of breath: Secondary | ICD-10-CM | POA: Diagnosis not present

## 2019-08-20 DIAGNOSIS — E785 Hyperlipidemia, unspecified: Secondary | ICD-10-CM | POA: Diagnosis not present

## 2019-08-20 DIAGNOSIS — N138 Other obstructive and reflux uropathy: Secondary | ICD-10-CM | POA: Diagnosis not present

## 2019-08-20 DIAGNOSIS — Z79899 Other long term (current) drug therapy: Secondary | ICD-10-CM | POA: Diagnosis not present

## 2019-08-20 DIAGNOSIS — E1129 Type 2 diabetes mellitus with other diabetic kidney complication: Secondary | ICD-10-CM | POA: Diagnosis not present

## 2019-08-20 DIAGNOSIS — R002 Palpitations: Secondary | ICD-10-CM | POA: Diagnosis not present

## 2019-08-20 DIAGNOSIS — R079 Chest pain, unspecified: Secondary | ICD-10-CM | POA: Diagnosis not present

## 2019-08-20 DIAGNOSIS — I110 Hypertensive heart disease with heart failure: Secondary | ICD-10-CM | POA: Diagnosis not present

## 2019-08-20 DIAGNOSIS — K219 Gastro-esophageal reflux disease without esophagitis: Secondary | ICD-10-CM | POA: Diagnosis not present

## 2019-08-20 DIAGNOSIS — F419 Anxiety disorder, unspecified: Secondary | ICD-10-CM | POA: Diagnosis not present

## 2019-08-20 DIAGNOSIS — N401 Enlarged prostate with lower urinary tract symptoms: Secondary | ICD-10-CM | POA: Diagnosis not present

## 2019-08-20 DIAGNOSIS — E1122 Type 2 diabetes mellitus with diabetic chronic kidney disease: Secondary | ICD-10-CM | POA: Diagnosis not present

## 2019-08-20 DIAGNOSIS — R1310 Dysphagia, unspecified: Secondary | ICD-10-CM | POA: Diagnosis not present

## 2019-08-20 DIAGNOSIS — I5032 Chronic diastolic (congestive) heart failure: Secondary | ICD-10-CM | POA: Diagnosis not present

## 2019-08-20 DIAGNOSIS — R0789 Other chest pain: Secondary | ICD-10-CM | POA: Diagnosis not present

## 2019-08-20 DIAGNOSIS — N183 Chronic kidney disease, stage 3 (moderate): Secondary | ICD-10-CM | POA: Diagnosis not present

## 2019-08-20 DIAGNOSIS — I951 Orthostatic hypotension: Secondary | ICD-10-CM | POA: Diagnosis not present

## 2019-08-20 DIAGNOSIS — K224 Dyskinesia of esophagus: Secondary | ICD-10-CM | POA: Diagnosis not present

## 2019-08-20 DIAGNOSIS — R600 Localized edema: Secondary | ICD-10-CM | POA: Diagnosis not present

## 2019-08-20 DIAGNOSIS — E876 Hypokalemia: Secondary | ICD-10-CM | POA: Diagnosis not present

## 2019-08-20 DIAGNOSIS — Z03818 Encounter for observation for suspected exposure to other biological agents ruled out: Secondary | ICD-10-CM | POA: Diagnosis not present

## 2019-08-20 DIAGNOSIS — G4733 Obstructive sleep apnea (adult) (pediatric): Secondary | ICD-10-CM | POA: Diagnosis not present

## 2019-08-20 DIAGNOSIS — I25700 Atherosclerosis of coronary artery bypass graft(s), unspecified, with unstable angina pectoris: Secondary | ICD-10-CM | POA: Diagnosis not present

## 2019-08-20 DIAGNOSIS — M7989 Other specified soft tissue disorders: Secondary | ICD-10-CM | POA: Diagnosis not present

## 2019-08-21 DIAGNOSIS — I2510 Atherosclerotic heart disease of native coronary artery without angina pectoris: Secondary | ICD-10-CM | POA: Diagnosis not present

## 2019-08-21 DIAGNOSIS — R9431 Abnormal electrocardiogram [ECG] [EKG]: Secondary | ICD-10-CM | POA: Diagnosis not present

## 2019-08-21 DIAGNOSIS — R079 Chest pain, unspecified: Secondary | ICD-10-CM | POA: Diagnosis not present

## 2019-08-24 ENCOUNTER — Other Ambulatory Visit: Payer: Self-pay | Admitting: Cardiovascular Disease

## 2019-08-24 DIAGNOSIS — M1712 Unilateral primary osteoarthritis, left knee: Secondary | ICD-10-CM | POA: Diagnosis not present

## 2019-08-27 DIAGNOSIS — Z09 Encounter for follow-up examination after completed treatment for conditions other than malignant neoplasm: Secondary | ICD-10-CM | POA: Diagnosis not present

## 2019-08-31 DIAGNOSIS — M1712 Unilateral primary osteoarthritis, left knee: Secondary | ICD-10-CM | POA: Diagnosis not present

## 2019-09-07 DIAGNOSIS — M1712 Unilateral primary osteoarthritis, left knee: Secondary | ICD-10-CM | POA: Diagnosis not present

## 2019-11-08 ENCOUNTER — Other Ambulatory Visit: Payer: Self-pay | Admitting: Surgery

## 2019-11-13 ENCOUNTER — Inpatient Hospital Stay: Admission: RE | Admit: 2019-11-13 | Payer: PPO | Source: Ambulatory Visit

## 2019-11-19 ENCOUNTER — Other Ambulatory Visit: Payer: PPO

## 2019-11-22 ENCOUNTER — Ambulatory Visit: Admit: 2019-11-22 | Payer: PPO | Admitting: Surgery

## 2019-11-22 SURGERY — ARTHROPLASTY, KNEE, TOTAL
Anesthesia: Choice | Site: Knee | Laterality: Left

## 2019-12-09 ENCOUNTER — Other Ambulatory Visit: Payer: Self-pay | Admitting: Cardiovascular Disease

## 2019-12-10 ENCOUNTER — Other Ambulatory Visit: Payer: Self-pay

## 2019-12-11 NOTE — Progress Notes (Signed)
Office Visit    Patient Name: [name] Date of Encounter: 12/11/2019  Primary Care Provider:  Juluis Pitch, MD Primary Cardiologist:  Ida Rogue, MD Electrophysiologist:  None   Chief Complaint    [name] is a 64 y.o. male with a hx of CAD s/p stent to RCA and Cx 2010 and CABG 2014 with SVG to OM, LIMA to LAD, postoperative atrial fibrillation, HLD, OSA on CPAP, chronic L sided chest pain, DM2, GERD, orthostasis presents today for follow up of CAD   Past Medical History    Past Medical History:  Diagnosis Date  . Anxiety   . Atherosclerosis of coronary artery bypass graft with angina pectoris ([place])   . Barrett's esophagus   . Benign enlargement of prostate   . Chronic constipation   . Colon polyp   . Coronary artery disease   . Depression   . GERD (gastroesophageal reflux disease)   . H/O esophageal spasm   . Hyperlipidemia   . Hypertension   . MI (myocardial infarction) ([place]) 2011 & 2014   x 2  . Sleep apnea     after weight loss no need for CPAP  . Type 2 diabetes mellitus ([place])    Past Surgical History:  Procedure Laterality Date  . abscess cellulitis resection     . ANAL FISSURE REPAIR    . CARDIAC CATHETERIZATION  2011  . COLONOSCOPY    . COLONOSCOPY WITH PROPOFOL N/A 06/20/2019   Procedure: COLONOSCOPY WITH PROPOFOL;  Surgeon: Toledo, Benay Pike, MD;  Location: [facility];  Service: Gastroenterology;  Laterality: N/A;  . CORONARY ANGIOPLASTY WITH STENT PLACEMENT  2011   stent placement x 3 @ [facility]; Dr. Saralyn Pilar  . CORONARY ARTERY BYPASS GRAFT  2014   CABG x 2  . ESOPHAGOGASTRODUODENOSCOPY (EGD) WITH PROPOFOL N/A 06/20/2019   Procedure: ESOPHAGOGASTRODUODENOSCOPY (EGD) WITH PROPOFOL;  Surgeon: Toledo, Benay Pike, MD;  Location: [facility];  Service: Gastroenterology;  Laterality: N/A;  . EXCISION MASS NECK  9935   faliculitis, incision became infected and was in hospital for 21 days  . MOUTH SURGERY  2019  . PARTIAL KNEE  ARTHROPLASTY Right 07/19/2017   Procedure: UNICOMPARTMENTAL KNEE;  Surgeon: Corky Mull, MD;  Location: [facility];  Service: Orthopedics;  Laterality: Right;  . TEE WITH CARDIOVERSION    . TOTAL KNEE ARTHROPLASTY Right 10/23/2018   Procedure: CONVERSION OF PARTIAL TO TOTAL KNEE ARTHROPLASTY;  Surgeon: Corky Mull, MD;  Location: [facility];  Service: Orthopedics;  Laterality: Right;    Allergies  Allergies  Allergen Reactions  . Oxycodone Other (See Comments)    Hallucinations  . Hydrocodone     hallucinations  . Tramadol     hallucination    History of Present Illness    [name] is a 64 y.o. male with a hx of hx of CAD s/p stent to RCA and Cx 2010 and CABG 2014 with SVG to OM, LIMA to LAD, postoperative atrial fibrillation, HLD, OSA on CPAP, chronic L sided chest pain, DM2, GERD, orthostasis. He was last seen 10/16/2018 by Dr. Rockey Situ.  Previous stress test 01/26/17 which was low risk with no significant ischemia. He had a normal echocardiogram 02/28/17. His orthostasis has been managed on midodrine.   Since that time underwent R total knee replacement, had some complications related to adverse reaction to medications and had to be readmitted. Anticipates knee replacement to his L knee upcoming. This has been rescheduled multiple times and limits  his physical activity.  Episode in 08/20/19 of chest pain. Was going camping with his wife.  Tells me that after sitting up there a palpable camper he began to have some chest discomfort, rapid heart rates, and was very anxious.  He spent the night at [facility].  Troponin normal x3.  Bilateral lower extremity ultrasound was negative for DVT.  Chest x-ray with no acute cardiopulmonary process.  He had a nuclear stress test with no evidence of my cardial ischemia or myocardial scar, normal LV wall motion, LVEF 61%, normal study.  He was started on as needed Ativan.  He has been very affected recently by the coronavirus pandemic and stress.  His  mother is beginning to show signs of dementia and he is helping to care for her.  He recently lost her dog and has been grieving this.  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  We discussed the COVID-19 vaccine and he tells me he plans to get it.  He was provided the website to join the wait list so that he can obtain the vaccine.  He reports no chest pain, pressure, tightness since the ED visit to [facility].  Reports no shortness of breath or dyspnea on exertion.  He anticipates having his left knee replaced and tells me he is somewhat frustrated that the surgery has been delayed multiple times due to the pandemic and is hopeful to get it done soon.  He is hopeful this will help him improve his mobility.  Primary complaint today is insomnia.  Tells me he has trouble falling asleep.  We discussed that his anxiety is likely contributory in the recent stress.  Encouraged him to discuss this with his PCP.  Denies lightheadedness, dizziness, near syncope.  Reports no lower extremity edema, orthopnea, PND.  Tells me he does try to follow a low-salt heart healthy diet.  EKGs/Labs/Other Studies Reviewed:   The following studies were reviewed today: Lexiscan Myoview 08/21/2019 Perfusion findings: Homogeneous tracer uptake throughout rest and stress imaging  TID ratio: 1.18  Left ventricular wall motion: There is normal contractility of all wall segments.  Left ventricular ejection fraction: 61%.  Right ventricular wall motion: n/a   IMPRESSION Normal rest/Regadenoson myocardial perfusion SPECT study demonstrating no evidence of stress-induced myocardial ischemia or myocardial scar. Left ventricular wall motion is normal. This is a normal study.  EKG:  EKG is ordered today.  The ekg ordered today demonstrates sinus rhythm 83 bpm with poor R wave progression  Recent Labs: No results found  for requested labs within last 8760 hours.  Recent Lipid Panel    Component Value Date/Time   CHOL  11/19/2009 0705    114        ATP III CLASSIFICATION:  <200     mg/dL   Desirable  381-017  mg/dL   Borderline High  >=510    mg/dL   High          TRIG 75 11/19/2009 0705   HDL 29 (L) 11/19/2009 0705   CHOLHDL 3.9 11/19/2009 0705   VLDL 15 11/19/2009 0705   LDLCALC  11/19/2009 0705    70        Total Cholesterol/HDL:CHD Risk Coronary Heart Disease Risk Table                     Men  Women  1/2 Average Risk   3.4   3.3  Average Risk       5.0   4.4  2 X Average Risk   9.6   7.1  3 X Average Risk  23.4   11.0        Use the calculated Patient Ratio above and the CHD Risk Table to determine the patient's CHD Risk.        ATP III CLASSIFICATION (LDL):  <100     mg/dL   Optimal  160-109  mg/dL   Near or Above                    Optimal  130-159  mg/dL   Borderline  323-557  mg/dL   High  >322     mg/dL   Very High    Home Medications   No outpatient medications have been marked as taking for the 12/12/19 encounter (Appointment) with Alver Sorrow, NP.      Review of Systems      Review of Systems  Constitution: Negative for chills, fever and malaise/fatigue.  Cardiovascular: Negative for chest pain, dyspnea on exertion, leg swelling, near-syncope, orthopnea, palpitations and syncope.  Respiratory: Negative for cough, shortness of breath and wheezing.   Musculoskeletal: Positive for joint pain (knee pain).  Gastrointestinal: Negative for nausea and vomiting.  Neurological: Negative for dizziness, light-headedness and weakness.  Psychiatric/Behavioral: The patient is nervous/anxious.    All other systems reviewed and are otherwise negative except as noted above.  Physical Exam    VS:  There were no vitals taken for this visit. , BMI There is no height or weight on file to calculate BMI. GEN: Well nourished, overweight, well developed, in no acute  distress. HEENT: normal. Neck: Supple, no JVD, carotid bruits, or masses. Cardiac: RRR, no murmurs, rubs, or gallops. No clubbing, cyanosis, edema.  Radials/PT 2+ and equal bilaterally.  Respiratory:  Respirations regular and unlabored, clear to auscultation bilaterally. GI: Soft, nontender, nondistended. MS: No deformity or atrophy. Skin: Warm and dry, no rash. Neuro:  Strength and sensation are intact. Psych: Normal affect.  Accessory Clinical Findings    ECG personally reviewed by me today -sinus rhythm 83 bpm with poor R wave progression and no acute ST/T wave changes- no acute changes.  Assessment & Plan    1. CAD s/p CABG - Normal stress test and echo 2018.  Normal stress test 07/2019 at [facility].  No recurrent chest pain.  No anginal symptoms at this time-no indication for ischemic evaluation.  Continue GDMT aspirin, statin.  No beta-blocker secondary to history of hypotension.  Encouraged low-sodium healthy diet.  Encourage regular physical activity though this is limited by his chronic knee pain.  2. HLD, LDL goal <70 - 02/2019 LDL 69. Continue atorvastatin 40 mg and Zetia 10 mg.  3. Orthostatic hypotension - No recent lightheadedness, dizziness, near syncope.  Continue present dose of midodrine.  4. Anxiety/Insomnia - We discussed that his anxiety is likely contributory to his insomnia.  Tells me he has difficulty falling asleep at night.  Discussed taking his as needed Ativan before bed.  He has tried over-the-counter sleep aids without relief.  Encouraged him to discuss with his PCP.  5. Preoperative cardiovascular examination -anticipates upcoming left knee replacement.  He has previously had his right knee done and overall tolerated well.  He had a normal stress test 07/2019 in outside hospital which I have reviewed.  EKG today sinus rhythm  83 bpm with no acute ST/T wave changes.  He is appropriate cardiovascular risk for the upcoming procedure and may proceed without further  cardiovascular testing.  Disposition: Follow up in 1 year(s) with Dr. Charlena Cross, NP 12/11/2019, 8:55 AM

## 2019-12-12 ENCOUNTER — Other Ambulatory Visit: Payer: Self-pay

## 2019-12-12 ENCOUNTER — Encounter: Payer: Self-pay | Admitting: Family

## 2019-12-12 ENCOUNTER — Ambulatory Visit (INDEPENDENT_AMBULATORY_CARE_PROVIDER_SITE_OTHER): Payer: PPO | Admitting: Family

## 2019-12-12 VITALS — BP 156/90 | HR 83 | Ht 70.0 in | Wt 256.8 lb

## 2019-12-12 DIAGNOSIS — Z01810 Encounter for preprocedural cardiovascular examination: Secondary | ICD-10-CM | POA: Diagnosis not present

## 2019-12-12 DIAGNOSIS — Z01818 Encounter for other preprocedural examination: Secondary | ICD-10-CM

## 2019-12-12 DIAGNOSIS — I25118 Atherosclerotic heart disease of native coronary artery with other forms of angina pectoris: Secondary | ICD-10-CM

## 2019-12-12 DIAGNOSIS — I951 Orthostatic hypotension: Secondary | ICD-10-CM

## 2019-12-12 DIAGNOSIS — G4700 Insomnia, unspecified: Secondary | ICD-10-CM | POA: Diagnosis not present

## 2019-12-12 DIAGNOSIS — E782 Mixed hyperlipidemia: Secondary | ICD-10-CM | POA: Diagnosis not present

## 2019-12-12 MED ORDER — EZETIMIBE 10 MG PO TABS: 10.0000 mg | ORAL_TABLET | Freq: Every day | ORAL | 3 refills | Status: DC

## 2019-12-12 NOTE — Patient Instructions (Addendum)
Medication Instructions:  No medication changes today. We sent a refill of your Zetia in today.   *If you need a refill on your cardiac medications before your next appointment, please call your pharmacy*  Lab Work: None ordered today.  Testing/Procedures: You  Had an EKG today which showed normal sinus rhythm. No signs of new blockages or new abnormal rhythms.   Follow-Up: At [facility], you and your health needs are our priority.  As part of our continuing mission to provide you with exceptional heart care, we have created designated Provider Care Teams.  These Care Teams include your primary Cardiologist (physician) and Advanced Practice Providers (APPs -  Physician Assistants and Nurse Practitioners) who all work together to provide you with the care you need, when you need it.  Your next appointment:   1 year(s)  The format for your next appointment:   In Person  Provider:   Julien Nordmann, MD  Other Instructions   You may try taking Ativan at bedtime for sleep. If that does not help, would recommend speaking with Dr. Terance Hart.   Website to sign up for COVID-19 vaccine wait list: [email or web address]

## 2019-12-13 ENCOUNTER — Telehealth: Payer: Self-pay | Admitting: Cardiovascular Disease

## 2019-12-13 NOTE — Telephone Encounter (Signed)
FYI       COVID-19 Pre-Screening Questions:  . In the past 7 to 10 days have you had a cough,  shortness of breath, headache, congestion, fever (100 or greater) body aches, chills, sore throat, or sudden loss of taste or sense of smell? NO  . Have you been around anyone with known Covid 19. YES . Have you been around anyone who is awaiting Covid 19 test results in the past 7 to 10 days?  YES . Have you been around anyone who has been exposed to Covid 19, or has mentioned symptoms of Covid 19 within the past 7 to 10 days? YES  Patient calling to let us know he had a positive exposure on 1/17  And was seen on 1/20 in the office.  He will call back with Covid testing results.   If you have any concerns/questions about symptoms patients report during screening (either on the phone or at [facility]). Contact the provider seeing the patient or DOD for further guidance.  If neither are available contact a member of the leadership team.

## 2019-12-13 NOTE — Telephone Encounter (Signed)
Pt had exposure to COVID and is pending his test results.   Tks

## 2019-12-14 ENCOUNTER — Ambulatory Visit: Payer: PPO | Attending: Internal Medicine

## 2019-12-14 DIAGNOSIS — Z20822 Contact with and (suspected) exposure to covid-19: Secondary | ICD-10-CM | POA: Diagnosis not present

## 2019-12-16 LAB — NOVEL CORONAVIRUS, NAA: SARS-CoV-2, NAA: NOT DETECTED

## 2019-12-28 ENCOUNTER — Other Ambulatory Visit: Payer: Self-pay | Admitting: Surgery

## 2020-01-02 ENCOUNTER — Telehealth: Payer: Self-pay | Admitting: Cardiovascular Disease

## 2020-01-02 NOTE — Telephone Encounter (Signed)
Pt c/o medication issue:  1. Name of Medication: midodrine 10 mg bid  2. What is your medication issue? [employer] sent patient a letter stating they are no longer covering this medication. Advised to patient to reach out and have either a letter of exception for the drug or switch the patient to another medication   Please advise

## 2020-01-03 NOTE — Telephone Encounter (Signed)
Spoke with patient and reviewed that prescription went through without any problems. He verbalized understanding with no further questions at this time.

## 2020-01-03 NOTE — Telephone Encounter (Signed)
Spoke with patient and reviewed that with coupon he can get that medication at [facility] for $45.47. He verbalized understanding but wants to know if there is a replacement medication. Let him know that I would forward to someone to see if we can somehow help with this and if not then send to provider for review and maybe different medication. He verbalized understanding with no further questions at this time.

## 2020-01-03 NOTE — Telephone Encounter (Signed)
Left voicemail message to call back for review of questions.

## 2020-01-03 NOTE — Telephone Encounter (Signed)
Spoke with Adela Glimpse at [facility] who ran the midodrine prescription through and states it was covered by patient's plan. Copay is $30 for 90 days. He is unable to initiate a PA because it was covered.

## 2020-01-04 ENCOUNTER — Encounter
Admission: RE | Admit: 2020-01-04 | Discharge: 2020-01-04 | Disposition: A | Discharge status: Home / Self Care | Payer: PPO | Source: Ambulatory Visit | Attending: Surgery | Admitting: Surgery

## 2020-01-04 ENCOUNTER — Ambulatory Visit: Payer: PPO | Attending: Internal Medicine

## 2020-01-04 ENCOUNTER — Other Ambulatory Visit: Payer: Self-pay

## 2020-01-04 DIAGNOSIS — Z23 Encounter for immunization: Secondary | ICD-10-CM | POA: Insufficient documentation

## 2020-01-04 NOTE — Patient Instructions (Signed)
Your procedure is scheduled on: January 10, 2020 THURSDAY Report to Day Surgery on the 2nd floor of the [facility] To find out your arrival time, please call [phone] between 1PM - 3PM on: January 09, 2020 [facility]  REMEMBER: Instructions that are not followed completely may result in serious medical risk, up to and including death; or upon the discretion of your surgeon and anesthesiologist your surgery may need to be rescheduled.  Do not eat food after midnight the night before surgery.  No gum chewing, lozengers or hard candies.  You may however, drink CLEAR liquids up to 2 hours before you are scheduled to arrive for your surgery. Do not drink anything within 2 hours of the start of your surgery.  Clear liquids include: - water   Do NOT drink anything that is not on this list.  Type 1 and Type 2 diabetics should only drink water.  GATORADE PRE-SURGERY DRINK:  Complete drinking 2 hours prior to SCHEDULED ARRIVAL TIME   No Alcohol for 24 hours before or after surgery.  No Smoking including e-cigarettes for 24 hours prior to surgery.  No chewable tobacco products for at least 6 hours prior to surgery.  No nicotine patches on the day of surgery.  On the morning of surgery brush your teeth with toothpaste and water, you may rinse your mouth with mouthwash if you wish. Do not swallow any toothpaste or mouthwash.  Notify your doctor if there is any change in your medical condition (cold, fever, infection).  Do not wear jewelry, make-up, hairpins, clips or nail polish.  Do not wear lotions, powders, or perfumes deodorant  Do not shave 48 hours prior to surgery.   Contacts and dentures may not be worn into surgery.  Do not bring valuables to the hospital, including drivers license, insurance or credit cards.  [facility] is not responsible for any belongings or valuables.   TAKE THESE MEDICATIONS THE MORNING OF  SURGERY: ATORVASTATIN CETIRIZINE EFFEXOR MEDODRINE   Use CHG Soap  as directed on instruction sheet.  Use inhalers on the day of surgery   Stop Metformin 2 days prior to surgery. LAST DOSE January 07, 2020  VERIFY WITH CARDIOLOGIST STOPPING ASPIRIN- PT WILL CALL CARDIOLOGIST 01/04/2020  Stop Anti-inflammatories (NSAIDS) such as Advil, Aleve, Ibuprofen, Motrin, Naproxen, Naprosyn and Aspirin based products such as Excedrin, Goodys Powder, BC Powder. (May take Tylenol or Acetaminophen if needed.)  Stop ANY OVER THE COUNTER supplements until after surgery. (May continue Vitamin D, Vitamin B, and multivitamin.)  Wear comfortable clothing (specific to your surgery type) to the hospital.  Plan for stool softeners for home use.  If you are being admitted to the hospital overnight, you may bring small bag with you.  If you are being discharged the day of surgery, you will not be allowed to drive home. You will need a responsible adult to drive you home and stay with you that night.   If you are taking public transportation, you will need to have a responsible adult with you. Please confirm with your physician that it is acceptable to use public transportation.   Please call [phone] if you have any questions about these instructions.

## 2020-01-04 NOTE — Progress Notes (Signed)
   Covid-19 Vaccination Clinic  Name:  [name]    MRN: [id number] DOB: 04-26-1956  01/04/2020  Mr. Attig was observed post Covid-19 immunization for 15 minutes without incidence. He was provided with Vaccine Information Sheet and instruction to access the V-Safe system.   Mr. Staffa was instructed to call 911 with any severe reactions post vaccine: Marland Kitchen Difficulty breathing  . Swelling of your face and throat  . A fast heartbeat  . A bad rash all over your body  . Dizziness and weakness    Immunizations Administered    Name Date Dose VIS Date Route   Pfizer COVID-19 Vaccine 01/04/2020  9:52 AM 0.3 mL 11/02/2019 Intramuscular   Manufacturer: [employer], [employer]   Lot: [id number]   NDC: [address]-3

## 2020-01-07 DIAGNOSIS — M1712 Unilateral primary osteoarthritis, left knee: Secondary | ICD-10-CM | POA: Diagnosis not present

## 2020-01-08 ENCOUNTER — Encounter
Admission: RE | Admit: 2020-01-08 | Discharge: 2020-01-08 | Disposition: A | Discharge status: Home / Self Care | Payer: PPO | Source: Ambulatory Visit | Attending: Surgery | Admitting: Surgery

## 2020-01-08 ENCOUNTER — Other Ambulatory Visit: Payer: Self-pay

## 2020-01-08 DIAGNOSIS — Z885 Allergy status to narcotic agent status: Secondary | ICD-10-CM | POA: Diagnosis not present

## 2020-01-08 DIAGNOSIS — N400 Enlarged prostate without lower urinary tract symptoms: Secondary | ICD-10-CM | POA: Diagnosis present

## 2020-01-08 DIAGNOSIS — Z96652 Presence of left artificial knee joint: Secondary | ICD-10-CM | POA: Diagnosis not present

## 2020-01-08 DIAGNOSIS — Z20822 Contact with and (suspected) exposure to covid-19: Secondary | ICD-10-CM | POA: Diagnosis present

## 2020-01-08 DIAGNOSIS — E785 Hyperlipidemia, unspecified: Secondary | ICD-10-CM | POA: Diagnosis present

## 2020-01-08 DIAGNOSIS — I2510 Atherosclerotic heart disease of native coronary artery without angina pectoris: Secondary | ICD-10-CM | POA: Diagnosis present

## 2020-01-08 DIAGNOSIS — G4730 Sleep apnea, unspecified: Secondary | ICD-10-CM | POA: Diagnosis not present

## 2020-01-08 DIAGNOSIS — Z01812 Encounter for preprocedural laboratory examination: Secondary | ICD-10-CM | POA: Insufficient documentation

## 2020-01-08 DIAGNOSIS — Z471 Aftercare following joint replacement surgery: Secondary | ICD-10-CM | POA: Diagnosis not present

## 2020-01-08 DIAGNOSIS — Z7984 Long term (current) use of oral hypoglycemic drugs: Secondary | ICD-10-CM | POA: Diagnosis not present

## 2020-01-08 DIAGNOSIS — Z79899 Other long term (current) drug therapy: Secondary | ICD-10-CM | POA: Diagnosis not present

## 2020-01-08 DIAGNOSIS — I252 Old myocardial infarction: Secondary | ICD-10-CM | POA: Diagnosis not present

## 2020-01-08 DIAGNOSIS — K219 Gastro-esophageal reflux disease without esophagitis: Secondary | ICD-10-CM | POA: Diagnosis present

## 2020-01-08 DIAGNOSIS — Z7982 Long term (current) use of aspirin: Secondary | ICD-10-CM | POA: Diagnosis not present

## 2020-01-08 DIAGNOSIS — F329 Major depressive disorder, single episode, unspecified: Secondary | ICD-10-CM | POA: Diagnosis present

## 2020-01-08 DIAGNOSIS — M1712 Unilateral primary osteoarthritis, left knee: Secondary | ICD-10-CM | POA: Diagnosis present

## 2020-01-08 DIAGNOSIS — Z8719 Personal history of other diseases of the digestive system: Secondary | ICD-10-CM | POA: Diagnosis not present

## 2020-01-08 DIAGNOSIS — I10 Essential (primary) hypertension: Secondary | ICD-10-CM | POA: Diagnosis present

## 2020-01-08 DIAGNOSIS — Z836 Family history of other diseases of the respiratory system: Secondary | ICD-10-CM | POA: Diagnosis not present

## 2020-01-08 DIAGNOSIS — E119 Type 2 diabetes mellitus without complications: Secondary | ICD-10-CM | POA: Diagnosis not present

## 2020-01-08 DIAGNOSIS — Z8249 Family history of ischemic heart disease and other diseases of the circulatory system: Secondary | ICD-10-CM | POA: Diagnosis not present

## 2020-01-08 DIAGNOSIS — Z951 Presence of aortocoronary bypass graft: Secondary | ICD-10-CM | POA: Diagnosis not present

## 2020-01-08 DIAGNOSIS — F419 Anxiety disorder, unspecified: Secondary | ICD-10-CM | POA: Diagnosis present

## 2020-01-08 DIAGNOSIS — Z888 Allergy status to other drugs, medicaments and biological substances status: Secondary | ICD-10-CM | POA: Diagnosis not present

## 2020-01-08 LAB — CBC WITH DIFFERENTIAL/PLATELET
Abs Immature Granulocytes: 0.03 10*3/uL (ref 0.00–0.07)
Basophils Absolute: 0.1 10*3/uL (ref 0.0–0.1)
Basophils Relative: 1 %
Eosinophils Absolute: 0.2 10*3/uL (ref 0.0–0.5)
Eosinophils Relative: 4 %
HCT: 43 % (ref 39.0–52.0)
Hemoglobin: 13.7 g/dL (ref 13.0–17.0)
Immature Granulocytes: 1 %
Lymphocytes Relative: 18 %
Lymphs Abs: 1.1 10*3/uL (ref 0.7–4.0)
MCH: 24.5 pg — ABNORMAL LOW (ref 26.0–34.0)
MCHC: 31.9 g/dL (ref 30.0–36.0)
MCV: 76.9 fL — ABNORMAL LOW (ref 80.0–100.0)
Monocytes Absolute: 0.5 10*3/uL (ref 0.1–1.0)
Monocytes Relative: 7 %
Neutro Abs: 4.3 10*3/uL (ref 1.7–7.7)
Neutrophils Relative %: 69 %
Platelets: 148 10*3/uL — ABNORMAL LOW (ref 150–400)
RBC: 5.59 MIL/uL (ref 4.22–5.81)
RDW: 14.4 % (ref 11.5–15.5)
WBC: 6.2 10*3/uL (ref 4.0–10.5)
nRBC: 0 % (ref 0.0–0.2)

## 2020-01-08 LAB — COMPREHENSIVE METABOLIC PANEL
ALT: 19 U/L (ref 0–44)
AST: 20 U/L (ref 15–41)
Albumin: 3.8 g/dL (ref 3.5–5.0)
Alkaline Phosphatase: 75 U/L (ref 38–126)
Anion gap: 8 (ref 5–15)
BUN: 9 mg/dL (ref 8–23)
CO2: 25 mmol/L (ref 22–32)
Calcium: 8.8 mg/dL — ABNORMAL LOW (ref 8.9–10.3)
Chloride: 106 mmol/L (ref 98–111)
Creatinine, Ser: 1.03 mg/dL (ref 0.61–1.24)
GFR calc Af Amer: >60 mL/min (ref >60)
GFR calc non Af Amer: >60 mL/min (ref >60)
Glucose, Bld: 136 mg/dL — ABNORMAL HIGH (ref 70–99)
Potassium: 3.5 mmol/L (ref 3.5–5.1)
Sodium: 139 mmol/L (ref 135–145)
Total Bilirubin: 1.3 mg/dL — ABNORMAL HIGH (ref 0.3–1.2)
Total Protein: 6.3 g/dL — ABNORMAL LOW (ref 6.5–8.1)

## 2020-01-08 LAB — URINALYSIS, ROUTINE W REFLEX MICROSCOPIC
Bacteria, UA: NONE SEEN
Bilirubin Urine: NEGATIVE
Glucose, UA: NEGATIVE mg/dL
Hgb urine dipstick: NEGATIVE
Ketones, ur: NEGATIVE mg/dL
Nitrite: NEGATIVE
Protein, ur: 30 mg/dL — AB
Specific Gravity, Urine: 1.016 (ref 1.005–1.030)
pH: 5 (ref 5.0–8.0)

## 2020-01-08 LAB — TYPE AND SCREEN
ABO/RH(D): A POS
Antibody Screen: NEGATIVE

## 2020-01-08 LAB — SARS CORONAVIRUS 2 (TAT 6-24 HRS): SARS Coronavirus 2: NEGATIVE

## 2020-01-08 LAB — SURGICAL PCR SCREEN
MRSA, PCR: NEGATIVE
Staphylococcus aureus: NEGATIVE

## 2020-01-10 ENCOUNTER — Inpatient Hospital Stay: Payer: PPO

## 2020-01-10 ENCOUNTER — Other Ambulatory Visit: Payer: Self-pay

## 2020-01-10 ENCOUNTER — Inpatient Hospital Stay
Admission: RE | Admit: 2020-01-10 | Discharge: 2020-01-12 | DRG: 470 | Disposition: A | Discharge status: Home Health | Payer: PPO | Attending: Surgery | Admitting: Surgery

## 2020-01-10 ENCOUNTER — Encounter
Admission: RE | Disposition: A | Discharge status: Home / Self Care | Payer: Self-pay | Source: Home / Self Care | Attending: Surgery

## 2020-01-10 ENCOUNTER — Inpatient Hospital Stay: Payer: PPO | Admitting: Anesthesiology

## 2020-01-10 DIAGNOSIS — Z836 Family history of other diseases of the respiratory system: Secondary | ICD-10-CM | POA: Diagnosis not present

## 2020-01-10 DIAGNOSIS — F419 Anxiety disorder, unspecified: Secondary | ICD-10-CM | POA: Diagnosis present

## 2020-01-10 DIAGNOSIS — M1712 Unilateral primary osteoarthritis, left knee: Principal | ICD-10-CM | POA: Diagnosis present

## 2020-01-10 DIAGNOSIS — E785 Hyperlipidemia, unspecified: Secondary | ICD-10-CM | POA: Diagnosis present

## 2020-01-10 DIAGNOSIS — F329 Major depressive disorder, single episode, unspecified: Secondary | ICD-10-CM | POA: Diagnosis present

## 2020-01-10 DIAGNOSIS — Z888 Allergy status to other drugs, medicaments and biological substances status: Secondary | ICD-10-CM | POA: Diagnosis not present

## 2020-01-10 DIAGNOSIS — I252 Old myocardial infarction: Secondary | ICD-10-CM | POA: Diagnosis not present

## 2020-01-10 DIAGNOSIS — Z79899 Other long term (current) drug therapy: Secondary | ICD-10-CM | POA: Diagnosis not present

## 2020-01-10 DIAGNOSIS — Z7984 Long term (current) use of oral hypoglycemic drugs: Secondary | ICD-10-CM

## 2020-01-10 DIAGNOSIS — Z20822 Contact with and (suspected) exposure to covid-19: Secondary | ICD-10-CM | POA: Diagnosis present

## 2020-01-10 DIAGNOSIS — Z8249 Family history of ischemic heart disease and other diseases of the circulatory system: Secondary | ICD-10-CM | POA: Diagnosis not present

## 2020-01-10 DIAGNOSIS — K219 Gastro-esophageal reflux disease without esophagitis: Secondary | ICD-10-CM | POA: Diagnosis present

## 2020-01-10 DIAGNOSIS — Z7982 Long term (current) use of aspirin: Secondary | ICD-10-CM | POA: Diagnosis not present

## 2020-01-10 DIAGNOSIS — N400 Enlarged prostate without lower urinary tract symptoms: Secondary | ICD-10-CM | POA: Diagnosis present

## 2020-01-10 DIAGNOSIS — I2510 Atherosclerotic heart disease of native coronary artery without angina pectoris: Secondary | ICD-10-CM | POA: Diagnosis present

## 2020-01-10 DIAGNOSIS — Z951 Presence of aortocoronary bypass graft: Secondary | ICD-10-CM | POA: Diagnosis not present

## 2020-01-10 DIAGNOSIS — Z885 Allergy status to narcotic agent status: Secondary | ICD-10-CM

## 2020-01-10 DIAGNOSIS — Z8719 Personal history of other diseases of the digestive system: Secondary | ICD-10-CM | POA: Diagnosis not present

## 2020-01-10 DIAGNOSIS — I10 Essential (primary) hypertension: Secondary | ICD-10-CM | POA: Diagnosis present

## 2020-01-10 DIAGNOSIS — Z96652 Presence of left artificial knee joint: Secondary | ICD-10-CM

## 2020-01-10 HISTORY — PX: TOTAL KNEE ARTHROPLASTY: SHX125

## 2020-01-10 LAB — GLUCOSE, CAPILLARY
Glucose-Capillary: 136 mg/dL — ABNORMAL HIGH (ref 70–99)
Glucose-Capillary: 141 mg/dL — ABNORMAL HIGH (ref 70–99)

## 2020-01-10 SURGERY — ARTHROPLASTY, KNEE, TOTAL
Anesthesia: General | Site: Knee | Laterality: Left

## 2020-01-10 MED ORDER — MIDAZOLAM HCL 5 MG/5ML IJ SOLN
INTRAMUSCULAR | Status: DC | PRN
  Administered 2020-01-10: 2 mg via INTRAVENOUS

## 2020-01-10 MED ORDER — BISACODYL 10 MG RE SUPP: 10.0000 mg | Freq: Every day | RECTAL | Status: DC | PRN

## 2020-01-10 MED ORDER — MAGNESIUM HYDROXIDE 400 MG/5ML PO SUSP: 30.0000 mL | Freq: Every day | ORAL | Status: DC | PRN

## 2020-01-10 MED ORDER — METOCLOPRAMIDE HCL 10 MG PO TABS
5.0000 mg | ORAL_TABLET | Freq: Three times a day (TID) | ORAL | Status: DC | PRN
  Administered 2020-01-10: 18:00:00 10 mg via ORAL

## 2020-01-10 MED ORDER — TRANEXAMIC ACID 1000 MG/10ML IV SOLN
INTRAVENOUS | Status: DC | PRN
  Administered 2020-01-10: 1000 mg via TOPICAL

## 2020-01-10 MED ORDER — BUPIVACAINE-EPINEPHRINE (PF) 0.5% -1:200000 IJ SOLN
INTRAMUSCULAR | Status: DC | PRN
  Administered 2020-01-10: 30 mL via PERINEURAL

## 2020-01-10 MED ORDER — FAMOTIDINE 20 MG PO TABS
ORAL_TABLET | ORAL | Status: AC
  Filled 2020-01-10: qty 1

## 2020-01-10 MED ORDER — LORAZEPAM 1 MG PO TABS: 1.0000 mg | ORAL_TABLET | Freq: Every day | ORAL | Status: DC | PRN

## 2020-01-10 MED ORDER — SODIUM CHLORIDE 0.9 % IV SOLN: INTRAVENOUS | Status: DC

## 2020-01-10 MED ORDER — PROPOFOL 10 MG/ML IV BOLUS
INTRAVENOUS | Status: AC
  Filled 2020-01-10: qty 20

## 2020-01-10 MED ORDER — FENTANYL CITRATE (PF) 100 MCG/2ML IJ SOLN
INTRAMUSCULAR | Status: DC | PRN
  Administered 2020-01-10: 100 ug via INTRAVENOUS

## 2020-01-10 MED ORDER — KETOROLAC TROMETHAMINE 15 MG/ML IJ SOLN
15.0000 mg | Freq: Four times a day (QID) | INTRAMUSCULAR | Status: AC
  Administered 2020-01-10 – 2020-01-11 (×4): 15 mg via INTRAVENOUS
  Filled 2020-01-10 (×4): qty 1

## 2020-01-10 MED ORDER — FAMOTIDINE 20 MG PO TABS
20.0000 mg | ORAL_TABLET | Freq: Once | ORAL | Status: AC
  Administered 2020-01-10: 20 mg via ORAL

## 2020-01-10 MED ORDER — FENTANYL CITRATE (PF) 100 MCG/2ML IJ SOLN
INTRAMUSCULAR | Status: AC
  Filled 2020-01-10: qty 2

## 2020-01-10 MED ORDER — EPHEDRINE SULFATE 50 MG/ML IJ SOLN
INTRAMUSCULAR | Status: DC | PRN
  Administered 2020-01-10: 7.5 mg via INTRAVENOUS

## 2020-01-10 MED ORDER — CEFAZOLIN SODIUM-DEXTROSE 2-4 GM/100ML-% IV SOLN
2.0000 g | INTRAVENOUS | Status: AC
  Administered 2020-01-10: 2 g via INTRAVENOUS

## 2020-01-10 MED ORDER — ASPIRIN EC 81 MG PO TBEC
81.0000 mg | DELAYED_RELEASE_TABLET | Freq: Every day | ORAL | Status: DC
  Administered 2020-01-10 – 2020-01-12 (×3): 81 mg via ORAL
  Filled 2020-01-10 (×3): qty 1

## 2020-01-10 MED ORDER — PROPOFOL 500 MG/50ML IV EMUL
INTRAVENOUS | Status: AC
  Filled 2020-01-10: qty 50

## 2020-01-10 MED ORDER — SODIUM CHLORIDE (PF) 0.9 % IJ SOLN
INTRAMUSCULAR | Status: AC
  Filled 2020-01-10: qty 50

## 2020-01-10 MED ORDER — BUPIVACAINE LIPOSOME 1.3 % IJ SUSP
INTRAMUSCULAR | Status: AC
  Filled 2020-01-10: qty 20

## 2020-01-10 MED ORDER — ACETAMINOPHEN 500 MG PO TABS
1000.0000 mg | ORAL_TABLET | Freq: Four times a day (QID) | ORAL | Status: AC
  Administered 2020-01-11 (×3): 1000 mg via ORAL
  Filled 2020-01-10 (×4): qty 2

## 2020-01-10 MED ORDER — DOCUSATE SODIUM 100 MG PO CAPS
100.0000 mg | ORAL_CAPSULE | Freq: Two times a day (BID) | ORAL | Status: DC
  Administered 2020-01-10 – 2020-01-12 (×4): 100 mg via ORAL
  Filled 2020-01-10 (×4): qty 1

## 2020-01-10 MED ORDER — MOMETASONE FURO-FORMOTEROL FUM 200-5 MCG/ACT IN AERO
2.0000 | INHALATION_SPRAY | Freq: Two times a day (BID) | RESPIRATORY_TRACT | Status: DC
  Administered 2020-01-10 – 2020-01-12 (×4): 2 via RESPIRATORY_TRACT
  Filled 2020-01-10: qty 8.8

## 2020-01-10 MED ORDER — LORATADINE 10 MG PO TABS
10.0000 mg | ORAL_TABLET | Freq: Every day | ORAL | Status: DC
  Administered 2020-01-11 – 2020-01-12 (×2): 10 mg via ORAL
  Filled 2020-01-10 (×2): qty 1

## 2020-01-10 MED ORDER — TRANEXAMIC ACID 1000 MG/10ML IV SOLN
INTRAVENOUS | Status: AC
  Filled 2020-01-10: qty 10

## 2020-01-10 MED ORDER — TRAMADOL HCL 50 MG PO TABS: 50.0000 mg | ORAL_TABLET | Freq: Four times a day (QID) | ORAL | Status: DC | PRN

## 2020-01-10 MED ORDER — PROMETHAZINE HCL 25 MG/ML IJ SOLN: 6.2500 mg | INTRAMUSCULAR | Status: DC | PRN

## 2020-01-10 MED ORDER — PANTOPRAZOLE SODIUM 40 MG PO TBEC
40.0000 mg | DELAYED_RELEASE_TABLET | Freq: Every day | ORAL | Status: DC
  Administered 2020-01-10 – 2020-01-12 (×3): 40 mg via ORAL
  Filled 2020-01-10 (×3): qty 1

## 2020-01-10 MED ORDER — DIPHENHYDRAMINE HCL 12.5 MG/5ML PO ELIX: 12.5000 mg | ORAL_SOLUTION | ORAL | Status: DC | PRN

## 2020-01-10 MED ORDER — BUPIVACAINE-EPINEPHRINE (PF) 0.5% -1:200000 IJ SOLN
INTRAMUSCULAR | Status: AC
  Filled 2020-01-10: qty 30

## 2020-01-10 MED ORDER — ACETAMINOPHEN 325 MG PO TABS
325.0000 mg | ORAL_TABLET | Freq: Four times a day (QID) | ORAL | Status: DC | PRN
  Administered 2020-01-12: 650 mg via ORAL
  Filled 2020-01-10: qty 2

## 2020-01-10 MED ORDER — METFORMIN HCL 850 MG PO TABS
850.0000 mg | ORAL_TABLET | Freq: Every day | ORAL | Status: DC
  Administered 2020-01-11 – 2020-01-12 (×2): 850 mg via ORAL
  Filled 2020-01-10 (×2): qty 1

## 2020-01-10 MED ORDER — ACETAMINOPHEN 10 MG/ML IV SOLN
INTRAVENOUS | Status: AC
  Filled 2020-01-10: qty 100

## 2020-01-10 MED ORDER — ALBUTEROL SULFATE (2.5 MG/3ML) 0.083% IN NEBU: 2.5000 mg | INHALATION_SOLUTION | Freq: Four times a day (QID) | RESPIRATORY_TRACT | Status: DC | PRN

## 2020-01-10 MED ORDER — MIDAZOLAM HCL 2 MG/2ML IJ SOLN
INTRAMUSCULAR | Status: AC
  Filled 2020-01-10: qty 2

## 2020-01-10 MED ORDER — ACETAMINOPHEN 10 MG/ML IV SOLN
INTRAVENOUS | Status: DC | PRN
  Administered 2020-01-10: 1000 mg via INTRAVENOUS

## 2020-01-10 MED ORDER — ENOXAPARIN SODIUM 40 MG/0.4ML ~~LOC~~ SOLN
40.0000 mg | SUBCUTANEOUS | Status: DC
  Administered 2020-01-11 – 2020-01-12 (×2): 40 mg via SUBCUTANEOUS
  Filled 2020-01-10 (×2): qty 0.4

## 2020-01-10 MED ORDER — FLEET ENEMA 7-19 GM/118ML RE ENEM: 1.0000 | ENEMA | Freq: Once | RECTAL | Status: DC | PRN

## 2020-01-10 MED ORDER — EPHEDRINE SULFATE 50 MG/ML IJ SOLN
INTRAMUSCULAR | Status: AC
  Filled 2020-01-10: qty 1

## 2020-01-10 MED ORDER — VENLAFAXINE HCL ER 150 MG PO CP24
150.0000 mg | ORAL_CAPSULE | Freq: Every day | ORAL | Status: DC
  Administered 2020-01-11 – 2020-01-12 (×2): 150 mg via ORAL
  Filled 2020-01-10 (×3): qty 1

## 2020-01-10 MED ORDER — SODIUM CHLORIDE 0.9 % IV SOLN
INTRAVENOUS | Status: DC | PRN
  Administered 2020-01-10: 25 ug/min via INTRAVENOUS

## 2020-01-10 MED ORDER — METOCLOPRAMIDE HCL 5 MG/ML IJ SOLN
5.0000 mg | Freq: Three times a day (TID) | INTRAMUSCULAR | Status: DC | PRN
  Filled 2020-01-10: qty 2

## 2020-01-10 MED ORDER — ATORVASTATIN CALCIUM 20 MG PO TABS
40.0000 mg | ORAL_TABLET | Freq: Every day | ORAL | Status: DC
  Administered 2020-01-11 – 2020-01-12 (×2): 40 mg via ORAL
  Filled 2020-01-10 (×2): qty 2

## 2020-01-10 MED ORDER — KETOROLAC TROMETHAMINE 30 MG/ML IJ SOLN
30.0000 mg | Freq: Once | INTRAMUSCULAR | Status: DC
  Administered 2020-01-10: 18:00:00 30 mg via INTRAVENOUS

## 2020-01-10 MED ORDER — PHENYLEPHRINE HCL (PRESSORS) 10 MG/ML IV SOLN
INTRAVENOUS | Status: DC | PRN
  Administered 2020-01-10 (×2): 100 ug via INTRAVENOUS

## 2020-01-10 MED ORDER — ONDANSETRON HCL 4 MG PO TABS: 4.0000 mg | ORAL_TABLET | Freq: Four times a day (QID) | ORAL | Status: DC | PRN

## 2020-01-10 MED ORDER — NITROGLYCERIN 0.4 MG SL SUBL: 0.4000 mg | SUBLINGUAL_TABLET | SUBLINGUAL | Status: DC | PRN

## 2020-01-10 MED ORDER — FENTANYL CITRATE (PF) 100 MCG/2ML IJ SOLN
INTRAMUSCULAR | Status: AC
  Administered 2020-01-10: 15:00:00 50 ug via INTRAVENOUS
  Filled 2020-01-10: qty 2

## 2020-01-10 MED ORDER — DIPHENHYDRAMINE HCL 25 MG PO CAPS: 50.0000 mg | ORAL_CAPSULE | Freq: Every evening | ORAL | Status: DC | PRN

## 2020-01-10 MED ORDER — BUPIVACAINE HCL (PF) 0.5 % IJ SOLN
INTRAMUSCULAR | Status: AC
  Filled 2020-01-10: qty 10

## 2020-01-10 MED ORDER — SODIUM CHLORIDE 0.9 % IV SOLN
INTRAVENOUS | Status: DC | PRN
  Administered 2020-01-10: 60 mL

## 2020-01-10 MED ORDER — EZETIMIBE 10 MG PO TABS
10.0000 mg | ORAL_TABLET | Freq: Every day | ORAL | Status: DC
  Administered 2020-01-10 – 2020-01-12 (×3): 10 mg via ORAL
  Filled 2020-01-10 (×3): qty 1

## 2020-01-10 MED ORDER — ONDANSETRON HCL 4 MG/2ML IJ SOLN: 4.0000 mg | Freq: Four times a day (QID) | INTRAMUSCULAR | Status: DC | PRN

## 2020-01-10 MED ORDER — HYDROMORPHONE HCL 1 MG/ML IJ SOLN: 0.2500 mg | INTRAMUSCULAR | Status: DC | PRN

## 2020-01-10 MED ORDER — CEFAZOLIN SODIUM-DEXTROSE 2-4 GM/100ML-% IV SOLN
2.0000 g | Freq: Four times a day (QID) | INTRAVENOUS | Status: AC
  Administered 2020-01-10 – 2020-01-11 (×3): 2 g via INTRAVENOUS
  Filled 2020-01-10 (×3): qty 100

## 2020-01-10 MED ORDER — FENTANYL CITRATE (PF) 100 MCG/2ML IJ SOLN
25.0000 ug | INTRAMUSCULAR | Status: DC | PRN
  Administered 2020-01-10: 15:00:00 50 ug via INTRAVENOUS

## 2020-01-10 MED ORDER — CHLORHEXIDINE GLUCONATE 4 % EX LIQD
60.0000 mL | Freq: Once | CUTANEOUS | Status: AC
  Administered 2020-01-10: 4 via TOPICAL

## 2020-01-10 MED ORDER — SODIUM CHLORIDE FLUSH 0.9 % IV SOLN
INTRAVENOUS | Status: AC
  Filled 2020-01-10: qty 10

## 2020-01-10 MED ORDER — ACETAMINOPHEN 325 MG PO TABS: 325.0000 mg | ORAL_TABLET | ORAL | Status: DC | PRN

## 2020-01-10 MED ORDER — PROPOFOL 500 MG/50ML IV EMUL
INTRAVENOUS | Status: DC | PRN
  Administered 2020-01-10: 75 ug/kg/min via INTRAVENOUS

## 2020-01-10 MED ORDER — MIDODRINE HCL 5 MG PO TABS
20.0000 mg | ORAL_TABLET | Freq: Every day | ORAL | Status: DC
  Administered 2020-01-11 – 2020-01-12 (×2): 20 mg via ORAL
  Filled 2020-01-10 (×2): qty 4

## 2020-01-10 MED ORDER — OXYCODONE HCL 5 MG PO TABS
5.0000 mg | ORAL_TABLET | ORAL | Status: DC | PRN
  Administered 2020-01-10 – 2020-01-11 (×4): 10 mg via ORAL
  Administered 2020-01-11: 5 mg via ORAL
  Administered 2020-01-12 (×3): 10 mg via ORAL
  Filled 2020-01-10 (×7): qty 2
  Filled 2020-01-10: qty 1

## 2020-01-10 MED ORDER — CEFAZOLIN SODIUM-DEXTROSE 2-4 GM/100ML-% IV SOLN
INTRAVENOUS | Status: AC
  Filled 2020-01-10: qty 100

## 2020-01-10 MED ORDER — BUPIVACAINE HCL (PF) 0.5 % IJ SOLN
INTRAMUSCULAR | Status: DC | PRN
  Administered 2020-01-10: 3 mL

## 2020-01-10 MED ORDER — ACETAMINOPHEN 160 MG/5ML PO SOLN
325.0000 mg | ORAL | Status: DC | PRN
  Filled 2020-01-10: qty 20.3

## 2020-01-10 SURGICAL SUPPLY — 61 items
BLADE SAW SAG 25X90X1.19 (BLADE) ×3 IMPLANT
BLADE SURG SZ20 CARB STEEL (BLADE) ×3 IMPLANT
BNDG ELASTIC 6X5.8 VLCR NS LF (GAUZE/BANDAGES/DRESSINGS) ×3 IMPLANT
CANISTER SUCT 1200ML W/VALVE (MISCELLANEOUS) ×3 IMPLANT
CANISTER SUCT 3000ML PPV (MISCELLANEOUS) ×3 IMPLANT
CEMENT BONE R 1X40 (Cement) ×6 IMPLANT
CEMENT VACUUM MIXING SYSTEM (MISCELLANEOUS) ×3 IMPLANT
CHLORAPREP W/TINT 26 (MISCELLANEOUS) ×3 IMPLANT
COOLER POLAR GLACIER W/PUMP (MISCELLANEOUS) ×3 IMPLANT
COVER MAYO STAND REUSABLE (DRAPES) ×3 IMPLANT
COVER WAND RF STERILE (DRAPES) ×3 IMPLANT
CUFF TOURN SGL QUICK 24 (TOURNIQUET CUFF)
CUFF TOURN SGL QUICK 30 (TOURNIQUET CUFF) ×2
CUFF TRNQT CYL 24X4X16.5-23 (TOURNIQUET CUFF) IMPLANT
CUFF TRNQT CYL 30X4X21-28X (TOURNIQUET CUFF) ×1 IMPLANT
DRAPE 3/4 80X56 (DRAPES) ×3 IMPLANT
DRAPE U-SHAPE 47X51 STRL (DRAPES) ×6 IMPLANT
DRSG OPSITE POSTOP 4X10 (GAUZE/BANDAGES/DRESSINGS) ×3 IMPLANT
DRSG OPSITE POSTOP 4X8 (GAUZE/BANDAGES/DRESSINGS) IMPLANT
ELECT CAUTERY BLADE 6.4 (BLADE) ×3 IMPLANT
ELECT REM PT RETURN 9FT ADLT (ELECTROSURGICAL) ×3
ELECTRODE REM PT RTRN 9FT ADLT (ELECTROSURGICAL) ×1 IMPLANT
FEMORAL CR LEFT  70MM (Joint) ×2 IMPLANT
FEMORAL CR LEFT 70MM (Joint) ×1 IMPLANT
GLOVE BIO SURGEON STRL SZ7.5 (GLOVE) ×12 IMPLANT
GLOVE BIO SURGEON STRL SZ8 (GLOVE) ×12 IMPLANT
GLOVE BIOGEL PI IND STRL 8 (GLOVE) ×1 IMPLANT
GLOVE BIOGEL PI INDICATOR 8 (GLOVE) ×2
GLOVE INDICATOR 8.0 STRL GRN (GLOVE) ×3 IMPLANT
GOWN STRL REUS W/ TWL LRG LVL3 (GOWN DISPOSABLE) ×4 IMPLANT
GOWN STRL REUS W/ TWL XL LVL3 (GOWN DISPOSABLE) ×1 IMPLANT
GOWN STRL REUS W/TWL LRG LVL3 (GOWN DISPOSABLE) ×8
GOWN STRL REUS W/TWL XL LVL3 (GOWN DISPOSABLE) ×2
HOLDER FOLEY CATH W/STRAP (MISCELLANEOUS) IMPLANT
HOOD PEEL AWAY FLYTE STAYCOOL (MISCELLANEOUS) ×15 IMPLANT
INSERT TIB BEARING 78X12 (Insert) ×3 IMPLANT
KIT TURNOVER KIT A (KITS) ×3 IMPLANT
NDL SAFETY ECLIPSE 18X1.5 (NEEDLE) ×2 IMPLANT
NEEDLE HYPO 18GX1.5 SHARP (NEEDLE) ×4
NEEDLE SPNL 20GX3.5 QUINCKE YW (NEEDLE) ×3 IMPLANT
NS IRRIG 1000ML POUR BTL (IV SOLUTION) ×3 IMPLANT
PACK TOTAL KNEE (MISCELLANEOUS) ×3 IMPLANT
PAD WRAPON POLAR KNEE (MISCELLANEOUS) ×1 IMPLANT
PEG PATELLA SERIES A 37MMX10MM (Orthopedic Implant) ×3 IMPLANT
PLATE KNEE TIBIAL 79MM FIXED (Plate) ×3 IMPLANT
PULSAVAC PLUS IRRIG FAN TIP (DISPOSABLE) ×3
SLEEVE PROTECTION STRL DISP (MISCELLANEOUS) ×3 IMPLANT
SOL .9 NS 3000ML IRR  AL (IV SOLUTION) ×2
SOL .9 NS 3000ML IRR UROMATIC (IV SOLUTION) ×1 IMPLANT
STAPLER SKIN PROX 35W (STAPLE) ×3 IMPLANT
SUCTION FRAZIER HANDLE 10FR (MISCELLANEOUS) ×2
SUCTION TUBE FRAZIER 10FR DISP (MISCELLANEOUS) ×1 IMPLANT
SUT VIC AB 0 CT1 36 (SUTURE) ×9 IMPLANT
SUT VIC AB 2-0 CT1 27 (SUTURE) ×6
SUT VIC AB 2-0 CT1 TAPERPNT 27 (SUTURE) ×3 IMPLANT
SYR 10ML LL (SYRINGE) ×3 IMPLANT
SYR 20ML LL LF (SYRINGE) ×3 IMPLANT
SYR 30ML LL (SYRINGE) ×9 IMPLANT
TIP FAN IRRIG PULSAVAC PLUS (DISPOSABLE) ×1 IMPLANT
TRAY FOLEY MTR SLVR 16FR STAT (SET/KITS/TRAYS/PACK) ×3 IMPLANT
WRAPON POLAR PAD KNEE (MISCELLANEOUS) ×3

## 2020-01-10 NOTE — Anesthesia Procedure Notes (Signed)
Spinal  Patient location during procedure: OR Staffing Performed: resident/CRNA and anesthesiologist  Anesthesiologist: Alphonsus Sias, MD Preanesthetic Checklist Completed: patient identified, IV checked, site marked, risks and benefits discussed, surgical consent, monitors and equipment checked, pre-op evaluation and timeout performed Spinal Block Patient position: sitting Prep: DuraPrep Patient monitoring: heart rate, cardiac monitor, continuous pulse ox and blood pressure Approach: right paramedian Location: L3-4 Injection technique: single-shot Needle Needle type: Sprotte  Needle gauge: 24 G Needle length: 10 cm Needle insertion depth: 7 cm Assessment Sensory level: T8 Additional Notes IV functioning, monitors applied to pt. Expiration date of kit checked and confirmed to be in date. Sterile prep and drape, hand hygiene and sterile gloved used. Pt was positioned and spine was prepped in sterile fashion. Skin was anesthetized with lidocaine. Free flow of clear CSF obtained prior to injecting local anesthetic into CSF x 3 attempts (midline x 2, R paramed x 1). Spinal needle aspirated freely following injection. Needle was carefully withdrawn, and pt tolerated procedure well. Loss of motor and sensory on exam post injection.

## 2020-01-10 NOTE — Transfer of Care (Signed)
Immediate Anesthesia Transfer of Care Note  Patient: [name]  Procedure(s) Performed: TOTAL KNEE ARTHROPLASTY (Left Knee)  Patient Location: PACU  Anesthesia Type:Spinal  Level of Consciousness: awake, alert  and oriented  Airway & Oxygen Therapy: Patient Spontanous Breathing and Patient connected to nasal cannula oxygen  Post-op Assessment: Report given to RN and Post -op Vital signs reviewed and stable  Post vital signs: Reviewed and stable  Last Vitals:  Vitals Value Taken Time  BP    Temp    Pulse    Resp    SpO2      Last Pain:  Vitals:   01/10/20 0906  TempSrc: Temporal  PainSc: 7       Patients Stated Pain Goal: 0 (01/10/20 0906)  Complications: No apparent anesthesia complications

## 2020-01-10 NOTE — Progress Notes (Signed)
Physical Therapy came to get this writer, Patient noted to have a copious amount of blood saturating honeycomb, moderate amount on paper chuk and towel. Paged Dr. Leron Croak

## 2020-01-10 NOTE — Anesthesia Postprocedure Evaluation (Signed)
Anesthesia Post Note  Patient: [name]  Procedure(s) Performed: TOTAL KNEE ARTHROPLASTY (Left Knee)  Patient location during evaluation: PACU Anesthesia Type: Spinal Level of consciousness: oriented and awake and alert Pain management: pain level controlled Vital Signs Assessment: post-procedure vital signs reviewed and stable Respiratory status: spontaneous breathing and respiratory function stable Cardiovascular status: blood pressure returned to baseline and stable Postop Assessment: no headache, no backache and no apparent nausea or vomiting Anesthetic complications: no     Last Vitals:  Vitals:   01/10/20 1329 01/10/20 1344  BP: 136/89 (!) 145/78  Pulse: 65 61  Resp: 13 12  Temp:    SpO2: 98% 98%    Last Pain:  Vitals:   01/10/20 1344  TempSrc:   PainSc: Asleep                 Christia Reading

## 2020-01-10 NOTE — Op Note (Signed)
01/10/2020  12:39 PM  Patient:   [name]  Pre-Op Diagnosis:   Degenerative joint disease, left knee.  Post-Op Diagnosis:   Same  Procedure:   Left TKA using all-cemented Biomet Vanguard system with a 70 mm PCR femur, a 79 mm tibial tray with a 12 mm anterior stabilized E-poly insert, and a 37 x 10 mm all-poly 3-pegged domed patella.  Surgeon:   Maryagnes Amos, MD  Assistant:   Horris Latino, PA-C; Prince Solian, PA-S  Anesthesia:   Spinal  Findings:   As above  Complications:   None  EBL:   10 cc  Fluids:   700 cc crystalloid  UOP:   None  TT:   95 minutes at 300 mmHg  Drains:   None  Closure:   Staples  Implants:   As above  Brief Clinical Note:   The patient is a 64 year old male with a long history of progressively worsening left knee pain. The patient's symptoms have progressed despite medications, activity modification, injections, etc. The patient's history and examination were consistent with advanced degenerative joint disease of the left knee confirmed by plain radiographs. The patient presents at this time for a left total knee arthroplasty.  Procedure:   The patient was brought into the operating room. After adequate spinal anesthesia was obtained, the patient was lain in the supine position. The left lower extremity was prepped with ChloraPrep solution and draped sterilely. Preoperative antibiotics were administered. After verifying the proper laterality with a surgical timeout, the limb was exsanguinated with an Esmarch and the tourniquet inflated to 300 mmHg. A standard anterior approach to the knee was made through an approximately 7 inch incision. The incision was carried down through the subcutaneous tissues to expose superficial retinaculum. This was split the length of the incision and the medial flap elevated sufficiently to expose the medial retinaculum. The medial retinaculum was incised, leaving a 3-4 mm cuff of tissue on the patella. This  was extended distally along the medial border of the patellar tendon and proximally through the medial third of the quadriceps tendon. A subtotal fat pad excision was performed before the soft tissues were elevated off the anteromedial and anterolateral aspects of the proximal tibia to the level of the collateral ligaments. The anterior portions of the medial and lateral menisci were removed, as was the anterior cruciate ligament. With the knee flexed to 90, the external tibial guide was positioned and the appropriate proximal tibial cut made. This piece was taken to the back table where it was measured and found to be optimally replicated by a 79 mm component.  Attention was directed to the distal femur. The intramedullary canal was accessed through a 3/8" drill hole. The intramedullary guide was inserted and positioned in order to obtain a neutral flexion gap. The intercondylar block was positioned with care taken to avoid notching the anterior cortex of the femur. The appropriate cut was made. Next, the distal cutting block was placed at 5 of valgus alignment. Using the 9 mm slot, the distal cut was made. The distal femur was measured and found to be optimally replicated by the 70 mm component. The 70 mm 4-in-1 cutting block was positioned and first the posterior, then the posterior chamfer, the anterior chamfer, and finally the anterior cuts were made. At this point, the posterior portions medial and lateral menisci were removed. A trial reduction was performed using the appropriate femoral and tibial components with first the 10 mm, then the 12 mm  insert. The 12 mm insert demonstrated excellent stability to varus and valgus stressing both in flexion and extension while permitting full extension. Patella tracking was assessed and found to be excellent. Therefore, the tibial guide position was marked on the proximal tibia. The patella thickness was measured and found to be 22 mm. Therefore, the appropriate  cut was made. The patellar surface was measured and found to be optimally replicated by the 37 mm component. The three peg holes were drilled in place before the trial button was inserted. Patella tracking was assessed and found to be excellent, passing the "no thumb test". The lug holes were drilled into the distal femur before the trial component was removed, leaving only the tibial tray. The keel was then created using the appropriate tower, reamer, and punch.  The bony surfaces were prepared for cementing by irrigating them thoroughly with bacitracin saline solution via the jet lavage system. A bone plug was fashioned from some of the bone that had been removed previously and used to plug the distal femoral canal. In addition, 20 cc of Exparel diluted out to 60 cc with normal saline and 30 cc of 0.5% Sensorcaine were injected into the postero-medial and postero-lateral aspects of the knee, the medial and lateral gutter regions, and the peri-incisional tissues to help with postoperative analgesia. Meanwhile, the cement was being mixed on the back table. When it was ready, the tibial tray was cemented in first. The excess cement was removed using [employer] fast streamer. Next, the femoral component was impacted into place. Again, the excess cement was removed using [employer] fast streamer. The 12 mm trial insert was positioned and the knee brought into extension while the cement hardened. Finally, the patella was cemented into place and secured using the patellar clamp. Again, the excess cement was removed using [employer] fast streamer. Once the cement had hardened, the knee was placed through a range of motion with the findings as described above. Therefore, the trial insert was removed and, after verifying that no cement had been retained posteriorly, the permanent 12 mm anterior stabilized E-polyethylene insert was positioned and secured using the appropriate key locking mechanism. Again the knee was placed through a range of motion  with the findings as described above.  The wound was copiously irrigated with sterile saline solution using the jet lavage system before the quadriceps tendon and retinacular layer were reapproximated using #0 Vicryl interrupted sutures. The superficial retinacular layer also was closed using a running #0 Vicryl suture. A total of 10 cc of transexemic acid (TXA) was injected intra-articularly before the subcutaneous tissues were closed in several layers using 2-0 Vicryl interrupted sutures. The skin was closed using staples. A sterile honeycomb dressing was applied to the skin before the leg was wrapped with an Ace wrap to accommodate the Polar Care device. The patient was then awakened and returned to the recovery room in satisfactory condition after tolerating the procedure well.

## 2020-01-10 NOTE — Progress Notes (Signed)
Per Dr. Joice Lofts , reinforced honeycomb with ABD gauze. And ace wrap , NO cpm tonight

## 2020-01-10 NOTE — Evaluation (Signed)
Physical Therapy Evaluation Patient Details Name: [name] MRN: [id number] DOB: September 14, 1956 Today's Date: 01/10/2020   History of Present Illness  [name] is a 64 y.o male who underwent elective L TKA using all-cemented Biomet Vanguard system on 01/10/2020. PMH includes atypical angina, barret's esophagus, CAD, chronic constipation, DM, GERD, HTN, sleep apnea, Hx of CABG, RTKA, 07/19/2017.    Clinical Impression  Patient alert and oriented and able to provide a detailed history. Wife present at bedside and corroborates. Prior to hospitalization, patient was I with all aspects of care and mobility. He lives with his wife in a single story home with walk in shower with seat and 3 steps to enter with R handrail. He has a BSC, RW, and crutches. Upon evaluation, patient required mod I to complete supine <> sit transfer and scoot up in bed. He was able to perform 10 A SLR on L LE and did not need an immobilizer. Once patient started moving and got to seated position he started to feel hot and nauseous. Blood pressure was found to have dropped from 158/92 mmHg in reclined position to 147/75 mmHg in seated. He did not feel as though he could continue with transfers or ambulation so he was assisted back to bed, the room heat was turned down and he was given a wet rag. Nursing provided reglan and pain medication. Patient also had some bleeding from the surgical site that was assessed by nursing with result of okay to continue PT. Bleeding did not worsen during session. Although patient was limited by nausea and feeling hot during day 0 initial eval, he showed good potential for transfers and ambulation sufficient for discharge home pending progress prior to discharge. Patient has experienced a decrease in functional independence and mobility and would benefit from skilled physical therapy to address impairments and functional limitations (see PT Problem list) to work towards stated goals and return to  PLOF or maximal functional independence.         Follow Up Recommendations Home health PT;Outpatient PT(reccomend outpatient PT if pt can access)    Equipment Recommendations  None recommended by PT    Recommendations for Other Services OT consult     Precautions / Restrictions Precautions Precautions: Fall Restrictions Weight Bearing Restrictions: Yes LLE Weight Bearing: Weight bearing as tolerated Other Position/Activity Restrictions: no pillow under L knee      Mobility  Bed Mobility Overal bed mobility: Modified Independent             General bed mobility comments: needed additional time to come to edge of bed and get back to bed but able to perform on own power. Also able to scoot up in bed without physical assistance.  Transfers                 General transfer comment: Patient became hot and sick to his stomach after moving to edge of bed. Did not feel he could transfer without vomiting or falling. Transfer deferred this session.  Ambulation/Gait             General Gait Details: Patient not ready to ambulate due to feeling hot and sick to stomach with EOB sitting.  Stairs            Wheelchair Mobility    Modified Rankin (Stroke Patients Only)       Balance Overall balance assessment: Needs assistance Sitting-balance support: No upper extremity supported Sitting balance-Leahy Scale: Good Sitting balance - Comments: Steady sitting at  EOB but struggling due to feeling hot and sick to stomach       Standing balance comment: did not assess this session due to feeling nauseous                             Pertinent Vitals/Pain Pain Assessment: 0-10 Pain Score: 8  Pain Location: right knee Pain Intervention(s): Limited activity within patient's tolerance;Monitored during session;Patient requesting pain meds-RN notified;Repositioned;Ice applied    Home Living Family/patient expects to be discharged to:: Private  residence Living Arrangements: Spouse/significant other Available Help at Discharge: Family;Available 24 hours/day Type of Home: House Home Access: Stairs to enter Entrance Stairs-Rails: Right Entrance Stairs-Number of Steps: 3 Home Layout: One level Home Equipment: Walker - 2 wheels;Crutches;Bedside commode;Shower seat Additional Comments: Patient states he has had two similar surgeries on R knee    Prior Function Level of Independence: Independent         Comments: Independent without AD for household and community ambulation. I with ADLs, IADLs, driving. Is retired.     Hand Dominance   Dominant Hand: Right    Extremity/Trunk Assessment   Upper Extremity Assessment Upper Extremity Assessment: Overall WFL for tasks assessed    Lower Extremity Assessment Lower Extremity Assessment: LLE deficits/detail;Overall [facility] for tasks assessed LLE Deficits / Details: Knee ROM lacking 20 degrees extension. Up to 100 degrees flexion. Able to perform A SLR x 10 on L LLE: Unable to fully assess due to pain LLE Sensation: decreased light touch(related to nerve block (wearing off))    Cervical / Trunk Assessment Cervical / Trunk Assessment: Normal  Communication   Communication: No difficulties  Cognition Arousal/Alertness: Awake/alert Behavior During Therapy: WFL for tasks assessed/performed Overall Cognitive Status: Within Functional Limits for tasks assessed                                        General Comments General comments (skin integrity, edema, etc.): Patient observed to be bleeding from incision site on L knee and had soaked onto bed and nearby chuck. Notified nursing who pulled back bandage and saw that honeycomb dressing was soaked. Not coming through ace wrap. Nursing advised that it was okay to continue with PT session and paged Dr. Roland Rack for any change in orders. Did not worsen throughout treatment. Patient positioned upon arrival with pillow under  heel but lower part of bed was raised under knees. Educated pt about importance of not placing anything behind knee and lowered bed to flat position.    Exercises Total Joint Exercises Goniometric ROM: L knee lacking about 20 degrees extension and able to flex to 100 degrees. Limited by pain. Other Exercises Other Exercises: educated patient and wife (at bedside) on importance of keeping knee straight while resting and not putting anything under knee. Also discussed sequencing for transfers, stairs, ambulation, safe use of RW, etc. Other Exercises: Placed pt in trendelenburg position and decreases room temperature to help ease nausea and feeling hot.   Assessment/Plan    PT Assessment Patient needs continued PT services  PT Problem List Decreased strength;Decreased mobility;Decreased range of motion;Decreased knowledge of precautions;Decreased activity tolerance;Decreased skin integrity;Decreased balance;Decreased knowledge of use of DME;Pain;Impaired sensation       PT Treatment Interventions DME instruction;Therapeutic activities;Gait training;Therapeutic exercise;Modalities;Patient/family education;Stair training;Balance training;Functional mobility training;Neuromuscular re-education    PT Goals (Current goals can be found in  the Care Plan section)  Acute Rehab PT Goals Patient Stated Goal: get better PT Goal Formulation: With patient Time For Goal Achievement: 01/24/20 Potential to Achieve Goals: Good    Frequency BID   Barriers to discharge   decrease in functional independence    Co-evaluation               AM-PAC PT "6 Clicks" Mobility  Outcome Measure Help needed turning from your back to your side while in a flat bed without using bedrails?: None Help needed moving from lying on your back to sitting on the side of a flat bed without using bedrails?: None Help needed moving to and from a bed to a chair (including a wheelchair)?: A Lot Help needed standing up from  a chair using your arms (e.g., wheelchair or bedside chair)?: A Lot Help needed to walk in hospital room?: A Lot Help needed climbing 3-5 steps with a railing? : Total 6 Click Score: 15    End of Session Equipment Utilized During Treatment: Gait belt Activity Tolerance: Patient limited by pain;Other (comment)(limited by feeling hot and getting nauseous with mobility) Patient left: in bed;with family/visitor present;with nursing/sitter in room;with call bell/phone within reach Nurse Communication: Mobility status;Patient requests pain meds PT Visit Diagnosis: Pain;Difficulty in walking, not elsewhere classified (R26.2);Muscle weakness (generalized) (M62.81);Unsteadiness on feet (R26.81) Pain - Right/Left: Left Pain - part of body: Knee    Time: 1750-1825 PT Time Calculation (min) (ACUTE ONLY): 35 min   Charges:   PT Evaluation $PT Eval Moderate Complexity: 1 Mod PT Treatments $Therapeutic Activity: 8-22 mins        Luretha Murphy. Ilsa Iha, PT, DPT 01/10/20, 6:57 PM

## 2020-01-10 NOTE — Anesthesia Preprocedure Evaluation (Addendum)
Anesthesia Evaluation  Patient identified by MRN, date of birth, ID band Patient awake    Reviewed: Allergy & Precautions, H&P , NPO status , reviewed documented beta blocker date and time   Airway Mallampati: II  TM Distance: >3 FB Neck ROM: full    Dental  (+) Missing, Partial Upper, Lower Dentures, Caps   Pulmonary shortness of breath, sleep apnea ,    Pulmonary exam normal        Cardiovascular hypertension, + angina + CAD and + Past MI  Normal cardiovascular exam  2018 ECHO Normal LV function No significant valve disease Overall excellent study  Stress Test Pharmacological myocardial perfusion imaging study with no significant  ischemia Normal wall motion, EF estimated at 66% No EKG changes concerning for ischemia at peak stress or in recovery. Low risk scan   Neuro/Psych PSYCHIATRIC DISORDERS Anxiety Depression    GI/Hepatic GERD  Medicated and Controlled,  Endo/Other  diabetes  Renal/GU      Musculoskeletal  (+) Arthritis ,   Abdominal   Peds  Hematology   Anesthesia Other Findings Past Medical History: No date: Anxiety No date: Atherosclerosis of coronary artery bypass graft with angina  pectoris (HCC) No date: Barrett's esophagus No date: Benign enlargement of prostate No date: Chronic constipation No date: Colon polyp No date: Coronary artery disease No date: Depression No date: GERD (gastroesophageal reflux disease) No date: H/O esophageal spasm No date: Hyperlipidemia No date: Hypertension 2011 & 2014: MI (myocardial infarction) (HCC)     Comment:  x 2 No date: Sleep apnea     Comment:   after weight loss no need for CPAP No date: Type 2 diabetes mellitus (HCC)  Past Surgical History: No date: abscess cellulitis resection  No date: ANAL FISSURE REPAIR 2011: CARDIAC CATHETERIZATION No date: COLONOSCOPY 06/20/2019: COLONOSCOPY WITH PROPOFOL; N/A     Comment:  Procedure: COLONOSCOPY  WITH PROPOFOL;  Surgeon: Tol[place],               Boykin Nearing, MD;  Location: [facility] ENDOSCOPY;  Service:               Gastroenterology;  Laterality: N/A; 2011: CORONARY ANGIOPLASTY WITH STENT PLACEMENT     Comment:  stent placement x 3 @ [facility]; Dr. Darrold Junker 2014: CORONARY ARTERY BYPASS GRAFT     Comment:  CABG x 2 06/20/2019: ESOPHAGOGASTRODUODENOSCOPY (EGD) WITH PROPOFOL; N/A     Comment:  Procedure: ESOPHAGOGASTRODUODENOSCOPY (EGD) WITH               PROPOFOL;  Surgeon: Toledo, Boykin Nearing, MD;  Location:               [facility] ENDOSCOPY;  Service: Gastroenterology;  Laterality:               N/A; 1985: EXCISION MASS NECK     Comment:  faliculitis, incision became infected and was in               hospital for 21 days 2019: MOUTH SURGERY 07/19/2017: PARTIAL KNEE ARTHROPLASTY; Right     Comment:  Procedure: UNICOMPARTMENTAL KNEE;  Surgeon: Christena Flake, MD;  Location: [facility] ORS;  Service: Orthopedics;                Laterality: Right; No date: TEE WITH CARDIOVERSION 10/23/2018: TOTAL KNEE ARTHROPLASTY; Right     Comment:  Procedure: CONVERSION OF PARTIAL TO TOTAL KNEE  ARTHROPLASTY;  Surgeon: Corky Mull, MD;  Location:               [facility] ORS;  Service: Orthopedics;  Laterality: Right;     Reproductive/Obstetrics                            Anesthesia Physical Anesthesia Plan  ASA: III  Anesthesia Plan: General and Spinal   Post-op Pain Management:    Induction: Intravenous  PONV Risk Score and Plan: 2 and Treatment may vary due to age or medical condition, TIVA, Midazolam and Ondansetron  Airway Management Planned: Nasal Cannula and Natural Airway  Additional Equipment:   Intra-op Plan:   Post-operative Plan:   Informed Consent: I have reviewed the patients History and Physical, chart, labs and discussed the procedure including the risks, benefits and alternatives for the proposed anesthesia with the patient or authorized  representative who has indicated his/her understanding and acceptance.     Dental Advisory Given  Plan Discussed with: CRNA  Anesthesia Plan Comments:        Anesthesia Quick Evaluation

## 2020-01-10 NOTE — H&P (Signed)
Paper H&P to be scanned into permanent record. H&P reviewed and patient re-examined. No changes. 

## 2020-01-11 LAB — BASIC METABOLIC PANEL
Anion gap: 5 (ref 5–15)
BUN: 12 mg/dL (ref 8–23)
CO2: 26 mmol/L (ref 22–32)
Calcium: 8.1 mg/dL — ABNORMAL LOW (ref 8.9–10.3)
Chloride: 106 mmol/L (ref 98–111)
Creatinine, Ser: 1.06 mg/dL (ref 0.61–1.24)
GFR calc Af Amer: >60 mL/min (ref >60)
GFR calc non Af Amer: >60 mL/min (ref >60)
Glucose, Bld: 122 mg/dL — ABNORMAL HIGH (ref 70–99)
Potassium: 3.6 mmol/L (ref 3.5–5.1)
Sodium: 137 mmol/L (ref 135–145)

## 2020-01-11 LAB — CBC
HCT: 36.4 % — ABNORMAL LOW (ref 39.0–52.0)
Hemoglobin: 11.7 g/dL — ABNORMAL LOW (ref 13.0–17.0)
MCH: 25.2 pg — ABNORMAL LOW (ref 26.0–34.0)
MCHC: 32.1 g/dL (ref 30.0–36.0)
MCV: 78.3 fL — ABNORMAL LOW (ref 80.0–100.0)
Platelets: 118 10*3/uL — ABNORMAL LOW (ref 150–400)
RBC: 4.65 MIL/uL (ref 4.22–5.81)
RDW: 14.5 % (ref 11.5–15.5)
WBC: 5.9 10*3/uL (ref 4.0–10.5)
nRBC: 0 % (ref 0.0–0.2)

## 2020-01-11 NOTE — Progress Notes (Signed)
Physical Therapy Treatment Patient Details Name: [name] MRN: [id number] DOB: 10-27-1956 Today's Date: 01/11/2020    History of Present Illness 64 y.o male who underwent elective L TKA using all-cemented Biomet Vanguard system on 01/10/2020. PMH includes atypical angina, barret's esophagus, CAD, chronic constipation, DM, GERD, HTN, sleep apnea, Hx of CABG, RTKA, 07/19/2017.    PT Comments    Pt did very well with PT session POD1, he was easily able to do SLRs, showed good quad control, had AROM to nearly 90 before warming up (101 deg with PROM after exercises) and was easily able to circumambulate the nurses' station with minimal UE reliance on the walker, good speed and consistent cadence w/o limp or hesitation.  Overall he showed good strength, mobility and safety and will be able to return home w/o issue when medically cleared.     Follow Up Recommendations  Home health PT;Follow surgeon's recommendation for DC plan and follow-up therapies     Equipment Recommendations  None recommended by PT    Recommendations for Other Services       Precautions / Restrictions Precautions Precautions: Fall Restrictions LLE Weight Bearing: Weight bearing as tolerated    Mobility  Bed Mobility Overal bed mobility: Independent             General bed mobility comments: Pt easily gets to EOB w/o assist  Transfers Overall transfer level: Independent Equipment used: Rolling walker (2 wheeled)             General transfer comment: Pt needing to lean weight forward aggressively to rise from standard height bed but able to get to standing w/o assist or hesitation  Ambulation/Gait Ambulation/Gait assistance: Supervision Gait Distance (Feet): 200 Feet Assistive device: Rolling walker (2 wheeled)       General Gait Details: From the first step pt had no hesitation on the L LE and was able to confidently maintain consistent cadence and speed with minimal reliance on the  walker/UEs.  Pt's HR in the 110s, O2 in the high 90s.     Stairs             Wheelchair Mobility    Modified Rankin (Stroke Patients Only)       Balance Overall balance assessment: Needs assistance Sitting-balance support: No upper extremity supported Sitting balance-Leahy Scale: Good Sitting balance - Comments: Easily maintains sitting balance w/o issues     Standing balance-Leahy Scale: Good Standing balance comment: confident and safe with standing balance                            Cognition Arousal/Alertness: Awake/alert Behavior During Therapy: WFL for tasks assessed/performed Overall Cognitive Status: Within Functional Limits for tasks assessed                                        Exercises Total Joint Exercises Ankle Circles/Pumps: AROM;10 reps Quad Sets: Strengthening;10 reps Short Arc Quad: Strengthening;10 reps Heel Slides: Strengthening;10 reps Hip ABduction/ADduction: Strengthening;10 reps Straight Leg Raises: Strengthening;10 reps Knee Flexion: PROM;10 reps Goniometric ROM: 1-101    General Comments        Pertinent Vitals/Pain Pain Assessment: 0-10 Pain Score: 3     Home Living                      Prior Function  PT Goals (current goals can now be found in the care plan section) Progress towards PT goals: Progressing toward goals    Frequency    BID      PT Plan Current plan remains appropriate    Co-evaluation              AM-PAC PT "6 Clicks" Mobility   Outcome Measure  Help needed turning from your back to your side while in a flat bed without using bedrails?: None Help needed moving from lying on your back to sitting on the side of a flat bed without using bedrails?: None Help needed moving to and from a bed to a chair (including a wheelchair)?: None Help needed standing up from a chair using your arms (e.g., wheelchair or bedside chair)?: None Help needed to  walk in hospital room?: None Help needed climbing 3-5 steps with a railing? : A Little 6 Click Score: 23    End of Session Equipment Utilized During Treatment: Gait belt Activity Tolerance: Patient tolerated treatment well Patient left: with chair alarm set;with call bell/phone within reach Nurse Communication: Mobility status PT Visit Diagnosis: Pain;Difficulty in walking, not elsewhere classified (R26.2);Muscle weakness (generalized) (M62.81);Unsteadiness on feet (R26.81) Pain - Right/Left: Left Pain - part of body: Knee     Time: 0786-7544 PT Time Calculation (min) (ACUTE ONLY): 57 min  Charges:  $Gait Training: 8-22 mins $Therapeutic Exercise: 23-37 mins $Therapeutic Activity: 8-22 mins                     Kreg Shropshire, DPT 01/11/2020, 11:42 AM

## 2020-01-11 NOTE — TOC Transition Note (Signed)
Transition of Care [facility]) - CM/SW Discharge Note   Patient Details  Name: [name] MRN: [id number] Date of Birth: 11-12-1956  Transition of Care [facility]) CM/SW Contact:  Elease Hashimoto, LCSW Phone Number: 01/11/2020, 11:09 AM   Clinical Narrative:   Met with pt who lives with his wife and was independent prior to admission. This surgery was elective and he has equipment already at home. Hopes to go home today after PT session. [facility] arranged for follow up at home. Pt aware will contact once home. No further follow.       Barriers to Discharge: Continued Medical Work up   Patient Goals and CMS Choice Patient states their goals for this hospitalization and ongoing recovery are:: To get back home now my surgery is done      Discharge Placement                       Discharge Plan and Services In-house Referral: Clinical Social Work                        HH Arranged: PT [place] Agency: [facility] [employer] [facility] [employer] Date [place]: 01/11/20 Time [place]: 1109 Representative spoke with at [place]: teresa  Social Determinants of Health ([place]) Interventions     Readmission Risk Interventions No flowsheet data found.

## 2020-01-11 NOTE — Progress Notes (Signed)
D: Pt alert and oriented x4. Was OOB with PT, tolerated PT very well.  A: Scheduled medications administered to pt, per MD orders. Support and encouragement provided. Frequent verbal contact made.   R: No adverse drug reactions noted. Pt complaint with medications and treatment plan. Pt interacts well with staff on the unit. Pt is stable at this time, will continue to monitor and provide care for as ordered.

## 2020-01-11 NOTE — Progress Notes (Signed)
  Subjective: 1 Day Post-Op Procedure(s) (LRB): TOTAL KNEE ARTHROPLASTY (Left) Patient reports pain as 2 on 0-10 scale.   Patient is well, and has had no acute complaints or problems Plan is to go Home after hospital stay. Negative for chest pain and shortness of breath Fever: no Gastrointestinal:Negative for nausea and vomiting  Objective: Vital signs in last 24 hours: Temp:  [96.5 F (35.8 C)-98.4 F (36.9 C)] 97.8 F (36.6 C) (02/19 0737) Pulse Rate:  [59-77] 77 (02/19 0737) Resp:  [11-20] 17 (02/19 0737) BP: (124-159)/(75-91) 152/77 (02/19 0737) SpO2:  [93 %-100 %] 94 % (02/19 0737)  Intake/Output from previous day:  Intake/Output Summary (Last 24 hours) at 01/11/2020 1120 Last data filed at 01/10/2020 2323 Gross per 24 hour  Intake 1200 ml  Output 910 ml  Net 290 ml    Intake/Output this shift: No intake/output data recorded.  Labs: Recent Labs    01/11/20 0536  HGB 11.7*   Recent Labs    01/11/20 0536  WBC 5.9  RBC 4.65  HCT 36.4*  PLT 118*   Recent Labs    01/11/20 0536  NA 137  K 3.6  CL 106  CO2 26  BUN 12  CREATININE 1.06  GLUCOSE 122*  CALCIUM 8.1*   No results for input(s): LABPT, INR in the last 72 hours.   EXAM General - Patient is Alert, Appropriate and Oriented Extremity - ABD soft Sensation intact distally Intact pulses distally Dorsiflexion/Plantar flexion intact Incision: moderate drainage No cellulitis present Dressing/Incision - blood tinged drainage.  New honeycomb dressing applied to the left knee today. Motor Function - intact, moving foot and toes well on exam.  Abdomen soft with normal BS.  Patient is passing gas.  Past Medical History:  Diagnosis Date  . Anxiety   . Atherosclerosis of coronary artery bypass graft with angina pectoris (HCC)   . Barrett's esophagus   . Benign enlargement of prostate   . Chronic constipation   . Colon polyp   . Coronary artery disease   . Depression   . GERD (gastroesophageal  reflux disease)   . H/O esophageal spasm   . Hyperlipidemia   . Hypertension   . MI (myocardial infarction) (HCC) 2011 & 2014   x 2  . Sleep apnea     after weight loss no need for CPAP  . Type 2 diabetes mellitus (HCC)     Assessment/Plan: 1 Day Post-Op Procedure(s) (LRB): TOTAL KNEE ARTHROPLASTY (Left) Active Problems:   Status post total knee replacement using cement, left  Estimated body mass index is 35.15 kg/m as calculated from the following:   Height as of this encounter: 5\' 10"  (1.778 m).   Weight as of this encounter: 111.1 kg. Advance diet Up with therapy D/C IV fluids when tolerating po intake.  Labs reviewed this AM.  Hg 11.7. COntinue with PT this afternoon. Begin working on BM. Plan for d/c home tomorrow with HHPT.  DVT Prophylaxis - Lovenox and Foot Pumps Weight-Bearing as tolerated to left leg  J. , PA-C [facility] Orthopaedic Surgery 01/11/2020, 11:20 AM

## 2020-01-11 NOTE — Progress Notes (Signed)
Physical Therapy Treatment Patient Details Name: [name] MRN: [id number] DOB: 1955/12/31 Today's Date: 01/11/2020    History of Present Illness 64 y.o male s/p L TKA 01/10/2020. PMH includes atypical angina, barret's esophagus, CAD, chronic constipation, DM, GERD, HTN, sleep apnea, Hx of CABG, RTKA, 07/19/2017.    PT Comments    Pt continues to do very well with PT and has surpassed typical POD2 and 3 goals today.  He was easily able to negotiate up/down steps, maintained consistent speed and cadence with ambulation and minimal walker use and showed great confidence and strength with LE exercises.  Pt with good quad strength/control, does lack the full TKE extension though ROM is WNL at this point post-op.  Overall pt is doing very well and is safe to return home when medically ready for d/c.    Follow Up Recommendations  Home health PT;Follow surgeon's recommendation for DC plan and follow-up therapies     Equipment Recommendations  None recommended by PT    Recommendations for Other Services       Precautions / Restrictions Precautions Precautions: Fall Restrictions LLE Weight Bearing: Weight bearing as tolerated    Mobility  Bed Mobility Overal bed mobility: Independent             General bed mobility comments: Pt easily gets to EOB w/o assist  Transfers Overall transfer level: Independent Equipment used: Rolling walker (2 wheeled)             General transfer comment: Pt better able to use UEs and maintain more upright posturing to get to standing, still requiring no assist  Ambulation/Gait Ambulation/Gait assistance: Modified independent (Device/Increase time) Gait Distance (Feet): 300 Feet Assistive device: Rolling walker (2 wheeled)       General Gait Details: Pt again able to immediately assume consistent and appropriate cadence and speed with minimal reliance on the walker and no significant fatigue (HR stayed below 100, O2 in the high  90s)   Stairs Stairs: Yes Stairs assistance: Modified independent (Device/Increase time) Stair Management: One rail Right Number of Stairs: 4 General stair comments: Pt was able to safely negotiate up/down steps with single hand on R rail, remembered appropriate sequencing from previous TKA, no assist needed   Wheelchair Mobility    Modified Rankin (Stroke Patients Only)       Balance Overall balance assessment: Needs assistance Sitting-balance support: No upper extremity supported Sitting balance-Leahy Scale: Good Sitting balance - Comments: Easily maintains sitting balance w/o issues     Standing balance-Leahy Scale: Good Standing balance comment: confident and safe with standing balance - minimal reliance on the walker                            Cognition Arousal/Alertness: Awake/alert Behavior During Therapy: WFL for tasks assessed/performed Overall Cognitive Status: Within Functional Limits for tasks assessed                                        Exercises Total Joint Exercises Quad Sets: Strengthening;15 reps Short Arc Quad: Strengthening;15 reps Heel Slides: Strengthening;10 reps Hip ABduction/ADduction: Strengthening;10 reps Straight Leg Raises: Strengthening;10 reps Knee Flexion: PROM;5 reps Goniometric ROM: pt again >100 ROM    General Comments        Pertinent Vitals/Pain Pain Score: (minimal pain at rest, 6/10 with ROM)    Home Living  Prior Function            PT Goals (current goals can now be found in the care plan section) Progress towards PT goals: Progressing toward goals    Frequency    BID      PT Plan Current plan remains appropriate    Co-evaluation              AM-PAC PT "6 Clicks" Mobility   Outcome Measure  Help needed turning from your back to your side while in a flat bed without using bedrails?: None Help needed moving from lying on your back to  sitting on the side of a flat bed without using bedrails?: None Help needed moving to and from a bed to a chair (including a wheelchair)?: None Help needed standing up from a chair using your arms (e.g., wheelchair or bedside chair)?: None Help needed to walk in hospital room?: None Help needed climbing 3-5 steps with a railing? : None 6 Click Score: 24    End of Session Equipment Utilized During Treatment: Gait belt Activity Tolerance: Patient tolerated treatment well Patient left: with chair alarm set;with call bell/phone within reach Nurse Communication: Mobility status PT Visit Diagnosis: Pain;Difficulty in walking, not elsewhere classified (R26.2);Muscle weakness (generalized) (M62.81);Unsteadiness on feet (R26.81) Pain - Right/Left: Left Pain - part of body: Knee     Time: 6387-5643 PT Time Calculation (min) (ACUTE ONLY): 34 min  Charges:  $Gait Training: 8-22 mins $Therapeutic Exercise: 8-22 mins                     Malachi Pro, DPT 01/11/2020, 3:04 PM

## 2020-01-12 LAB — CBC
HCT: 34 % — ABNORMAL LOW (ref 39.0–52.0)
Hemoglobin: 11 g/dL — ABNORMAL LOW (ref 13.0–17.0)
MCH: 24.9 pg — ABNORMAL LOW (ref 26.0–34.0)
MCHC: 32.4 g/dL (ref 30.0–36.0)
MCV: 77.1 fL — ABNORMAL LOW (ref 80.0–100.0)
Platelets: 118 10*3/uL — ABNORMAL LOW (ref 150–400)
RBC: 4.41 MIL/uL (ref 4.22–5.81)
RDW: 14.5 % (ref 11.5–15.5)
WBC: 6.8 10*3/uL (ref 4.0–10.5)
nRBC: 0 % (ref 0.0–0.2)

## 2020-01-12 LAB — BASIC METABOLIC PANEL
Anion gap: 7 (ref 5–15)
BUN: 13 mg/dL (ref 8–23)
CO2: 24 mmol/L (ref 22–32)
Calcium: 8.2 mg/dL — ABNORMAL LOW (ref 8.9–10.3)
Chloride: 104 mmol/L (ref 98–111)
Creatinine, Ser: 1.12 mg/dL (ref 0.61–1.24)
GFR calc Af Amer: >60 mL/min (ref >60)
GFR calc non Af Amer: >60 mL/min (ref >60)
Glucose, Bld: 104 mg/dL — ABNORMAL HIGH (ref 70–99)
Potassium: 3.5 mmol/L (ref 3.5–5.1)
Sodium: 135 mmol/L (ref 135–145)

## 2020-01-12 MED ORDER — OXYCODONE HCL 5 MG PO TABS: 5.0000 mg | ORAL_TABLET | ORAL | 0 refills | Status: DC | PRN

## 2020-01-12 MED ORDER — TRAMADOL HCL 50 MG PO TABS: 50.0000 mg | ORAL_TABLET | Freq: Four times a day (QID) | ORAL | 0 refills | Status: DC | PRN

## 2020-01-12 MED ORDER — ENOXAPARIN SODIUM 40 MG/0.4ML ~~LOC~~ SOLN: 40.0000 mg | SUBCUTANEOUS | 0 refills | Status: DC

## 2020-01-12 NOTE — Discharge Summary (Signed)
Physician Discharge Summary  Patient ID: [name] MRN: [id number] DOB/AGE: 05-17-1956 64 y.o.  Admit date: 01/10/2020 Discharge date: 01/12/2020  Admission Diagnoses:  Status post total knee replacement using cement, left [Z96.652]  Discharge Diagnoses: Patient Active Problem List   Diagnosis Date Noted  . Status post total knee replacement using cement, left 01/10/2020  . Acute delirium 11/03/2018  . Acute encephalopathy 11/02/2018  . Status post total knee replacement using cement, right 10/23/2018  . Status post unicompartmental knee replacement, right 07/19/2017  . Viral URI with cough 11/10/2015  . Falls 08/21/2015  . Coronary artery disease 08/21/2015  . Sleep apnea 03/26/2015  . Chest discomfort 01/28/2015  . Shortness of breath 01/28/2015  . S/P CABG x 2 01/28/2015  . Hyperlipidemia 01/28/2015  . Type 2 diabetes mellitus with other circulatory complications (HCC) 01/28/2015  . Morbid obesity (HCC) 01/28/2015  . Orthostatic hypotension 01/28/2015    Past Medical History:  Diagnosis Date  . Anxiety   . Atherosclerosis of coronary artery bypass graft with angina pectoris (HCC)   . Barrett's esophagus   . Benign enlargement of prostate   . Chronic constipation   . Colon polyp   . Coronary artery disease   . Depression   . GERD (gastroesophageal reflux disease)   . H/O esophageal spasm   . Hyperlipidemia   . Hypertension   . MI (myocardial infarction) (HCC) 2011 & 2014   x 2  . Sleep apnea     after weight loss no need for CPAP  . Type 2 diabetes mellitus (HCC)      Transfusion: None.   Consultants (if any):   Discharged Condition: Improved  Hospital Course: [name] is an 64 y.o. male who was admitted 01/10/2020 with a diagnosis of left knee osteoarthritis and went to the operating room on 01/10/2020 and underwent the above named procedures.    Surgeries: Procedure(s): TOTAL KNEE ARTHROPLASTY on 01/10/2020 Patient tolerated the surgery  well. Taken to PACU where she was stabilized and then transferred to the orthopedic floor.  Started on Lovenox 40mg  q 24 hrs. Foot pumps applied bilaterally at 80 mm. Heels elevated on bed with rolled towels. No evidence of DVT. Negative [place]. Physical therapy started on day #1 for gait training and transfer. OT started day #1 for ADL and assisted devices.  Patient's IV was removed on POD2, Foley was removed on POD1.  Implants: Left TKA using all-cemented [employer] Vanguard system with a 70 mm PCR femur, a 79 mm tibial tray with a 12 mm anterior stabilized E-poly insert, and a 37 x 10 mm all-poly 3-pegged domed patella.  He was given perioperative antibiotics:  Anti-infectives (From admission, onward)   Start     Dose/Rate Route Frequency Ordered Stop   01/10/20 1630  ceFAZolin (ANCEF) IVPB 2g/100 mL premix     2 g 200 mL/hr over 30 Minutes Intravenous Every 6 hours 01/10/20 1605 01/11/20 0535   01/10/20 0930  ceFAZolin (ANCEF) IVPB 2g/100 mL premix     2 g 200 mL/hr over 30 Minutes Intravenous On call to O.R. 01/10/20 0917 01/10/20 1035   01/10/20 0919  ceFAZolin (ANCEF) 2-4 GM/100ML-% IVPB    Note to Pharmacy: Register, Karen   : cabinet override      01/10/20 0919 01/10/20 1037    .  He was given sequential compression devices, early ambulation, and Lovenox for DVT prophylaxis.  He benefited maximally from the hospital stay and there were no complications.  Recent vital signs:  Vitals:   01/12/20 0516 01/12/20 0855  BP:  (!) 147/91  Pulse:  92  Resp:    Temp: 98.2 F (36.8 C) 97.7 F (36.5 C)  SpO2:  98%    Recent laboratory studies:  Lab Results  Component Value Date   HGB 11.0 (L) 01/12/2020   HGB 11.7 (L) 01/11/2020   HGB 13.7 01/08/2020   Lab Results  Component Value Date   WBC 6.8 01/12/2020   PLT 118 (L) 01/12/2020   Lab Results  Component Value Date   INR 1.23 11/02/2018   Lab Results  Component Value Date   NA 135 01/12/2020   K 3.5 01/12/2020    CL 104 01/12/2020   CO2 24 01/12/2020   BUN 13 01/12/2020   CREATININE 1.12 01/12/2020   GLUCOSE 104 (H) 01/12/2020    Discharge Medications:   Allergies as of 01/12/2020      Reactions   Oxycodone Other (See Comments)   Hallucinations   Hydrocodone    hallucinations   Tramadol    hallucination      Medication List    STOP taking these medications   aspirin EC 81 MG tablet     TAKE these medications   acetaminophen 325 MG tablet Commonly known as: TYLENOL Take 650 mg by mouth every 6 (six) hours as needed for moderate pain or headache.   albuterol 108 (90 Base) MCG/ACT inhaler Commonly known as: VENTOLIN HFA Inhale 1-2 puffs into the lungs every 6 (six) hours as needed for wheezing or shortness of breath.   atorvastatin 40 MG tablet Commonly known as: LIPITOR Take 40 mg by mouth daily.   cetirizine 10 MG tablet Commonly known as: ZYRTEC Take 10 mg by mouth daily as needed for allergies.   diphenhydrAMINE 50 MG capsule Commonly known as: BENADRYL Take 50 mg by mouth at bedtime as needed for sleep.   enoxaparin 40 MG/0.4ML injection Commonly known as: LOVENOX Inject 0.4 mLs (40 mg total) into the skin daily.   ezetimibe 10 MG tablet Commonly known as: ZETIA Take 1 tablet (10 mg total) by mouth daily.   fluticasone-salmeterol 115-21 MCG/ACT inhaler Commonly known as: ADVAIR HFA Inhale 2 puffs into the lungs 2 (two) times daily.   LORazepam 1 MG tablet Commonly known as: ATIVAN Take 1 mg by mouth daily as needed for anxiety.   metFORMIN 850 MG tablet Commonly known as: GLUCOPHAGE Take 850 mg by mouth daily with breakfast.   midodrine 10 MG tablet Commonly known as: PROAMATINE Take 2 tablets (20 mg total) by mouth daily. What changed: how much to take   nitroGLYCERIN 0.4 MG SL tablet Commonly known as: NITROSTAT Place 1 tablet (0.4 mg total) under the tongue every 5 (five) minutes as needed for chest pain.   oxyCODONE 5 MG immediate release  tablet Commonly known as: Oxy IR/ROXICODONE Take 1-2 tablets (5-10 mg total) by mouth every 4 (four) hours as needed for moderate pain.   RABEprazole 20 MG tablet Commonly known as: ACIPHEX Take 20 mg by mouth daily.   traMADol 50 MG tablet Commonly known as: ULTRAM Take 1 tablet (50 mg total) by mouth every 6 (six) hours as needed for moderate pain.   venlafaxine XR 75 MG 24 hr capsule Commonly known as: EFFEXOR-XR Take 150 mg by mouth daily.            Durable Medical Equipment  (From admission, onward)         Start  Ordered   01/10/20 1606  DME Bedside commode  Once    Question:  Patient needs a bedside commode to treat with the following condition  Answer:  Status post total knee replacement using cement, left   01/10/20 1605   01/10/20 1606  DME 3 n 1  Once     01/10/20 1605   01/10/20 1606  DME Walker rolling  Once    Question Answer Comment  Walker: With 5 Inch Wheels   Patient needs a walker to treat with the following condition Status post total knee replacement using cement, left      01/10/20 1605          Diagnostic Studies: DG Knee Left Port  Result Date: 01/10/2020 CLINICAL DATA:  Postop day 0 LEFT total knee arthroplasty. EXAM: PORTABLE LEFT KNEE - 1-2 VIEW COMPARISON:  None. FINDINGS: LEFT total knee arthroplasty with anatomic alignment. No acute complicating features. Gas in fluid in the joint space as expected. IMPRESSION: LEFT total knee arthroplasty with anatomic alignment and no acute complicating features. Electronically Signed   By: Evangeline Dakin M.D.   On: 01/10/2020 13:22   Disposition: Plan for discharge home on 01/12/20 based on progress with PT.  Follow-up Information    Lattie Corns, PA-C Follow up in 14 day(s).   Specialty: Physician Assistant Why: Electa Sniff information: [place] [id number] [phone]          Signed: Judson Roch PA-C 01/12/2020,  11:08 AM

## 2020-01-12 NOTE — Plan of Care (Signed)

## 2020-01-12 NOTE — Discharge Instructions (Signed)

## 2020-01-12 NOTE — Progress Notes (Signed)
  Subjective: 2 Days Post-Op Procedure(s) (LRB): TOTAL KNEE ARTHROPLASTY (Left) Patient reports pain as mild.   Patient is well, and has had no acute complaints or problems  Did run temp of 100.7 last night, currently afebrile. Plan is to go Home after hospital stay. Negative for chest pain and shortness of breath Gastrointestinal:Negative for nausea and vomiting  Objective: Vital signs in last 24 hours: Temp:  [97.7 F (36.5 C)-100.7 F (38.2 C)] 97.7 F (36.5 C) (02/20 0855) Pulse Rate:  [72-93] 92 (02/20 0855) Resp:  [16-18] 18 (02/20 0220) BP: (143-159)/(75-91) 147/91 (02/20 0855) SpO2:  [94 %-100 %] 98 % (02/20 0855)  Intake/Output from previous day:  Intake/Output Summary (Last 24 hours) at 01/12/2020 1105 Last data filed at 01/12/2020 0955 Gross per 24 hour  Intake 240 ml  Output 1300 ml  Net -1060 ml    Intake/Output this shift: Total I/O In: 240 [P.O.:240] Out: -   Labs: Recent Labs    01/11/20 0536 01/12/20 0539  HGB 11.7* 11.0*   Recent Labs    01/11/20 0536 01/12/20 0539  WBC 5.9 6.8  RBC 4.65 4.41  HCT 36.4* 34.0*  PLT 118* 118*   Recent Labs    01/11/20 0536 01/12/20 0539  NA 137 135  K 3.6 3.5  CL 106 104  CO2 26 24  BUN 12 13  CREATININE 1.06 1.12  GLUCOSE 122* 104*  CALCIUM 8.1* 8.2*   No results for input(s): LABPT, INR in the last 72 hours.   EXAM General - Patient is Alert, Appropriate and Oriented Extremity - ABD soft Sensation intact distally Intact pulses distally Dorsiflexion/Plantar flexion intact Incision: dressing C/D/I No cellulitis present Dressing/Incision - No drainage to the new honeycomb dressing applied yesterday. Abdomen soft with normal BS.  Patient is passing gas.  Past Medical History:  Diagnosis Date  . Anxiety   . Atherosclerosis of coronary artery bypass graft with angina pectoris (HCC)   . Barrett's esophagus   . Benign enlargement of prostate   . Chronic constipation   . Colon polyp   .  Coronary artery disease   . Depression   . GERD (gastroesophageal reflux disease)   . H/O esophageal spasm   . Hyperlipidemia   . Hypertension   . MI (myocardial infarction) (HCC) 2011 & 2014   x 2  . Sleep apnea     after weight loss no need for CPAP  . Type 2 diabetes mellitus (HCC)     Assessment/Plan: 2 Days Post-Op Procedure(s) (LRB): TOTAL KNEE ARTHROPLASTY (Left) Active Problems:   Status post total knee replacement using cement, left  Estimated body mass index is 35.15 kg/m as calculated from the following:   Height as of this encounter: 5\' 10"  (1.778 m).   Weight as of this encounter: 111.1 kg. Up with therapy   Labs reviewed this AM.  Hg 11.7. Temp 100.7 last night.  Denies any chest pain, SOB, or abdominal pain.  No urinary symptoms. Encouraged incentive spirometer.  WBC 6.8 Patient is passing gas without pain.  Continue to work on BM. Plan for d/c home with HHPT this afternoon.  DVT Prophylaxis - Lovenox and Foot Pumps Weight-Bearing as tolerated to left leg  J. , PA-C [facility] Orthopaedic Surgery 01/12/2020, 11:05 AM

## 2020-01-12 NOTE — Progress Notes (Signed)
Physical Therapy Treatment Patient Details Name: [name] MRN: [id number] DOB: [birth year] Today's Date: 01/12/2020    History of Present Illness 64 y.o male s/p L TKA 01/10/2020. PMH includes atypical angina, barret's esophagus, CAD, chronic constipation, DM, GERD, HTN, sleep apnea, Hx of CABG, RTKA, 07/19/2017.    PT Comments    Pt was long sitting in bed upon arriving. He agrees to PT session and is cooperative throughout. Pt had fever last night but current temp 97.7. HR 88bpm and O2 99%. Pt demonstrated safe ability to exit bed, transfer and ambulate with RW with modified Independence. No physical assistance required. Occasional Vcs for improved gait kinematics and improve posture with good carryover. Pt also demonstrates safe ability to ascend/descend stairs without LOB or unsteadiness. PT will continue to follow pt per POC. He was seated in recliner with BLEs elevated, towel roll placed under L heel and polar care re-applied. He tolerated session well and is planning to d/c home today.     Follow Up Recommendations  Home health PT;Follow surgeon's recommendation for DC plan and follow-up therapies     Equipment Recommendations  None recommended by PT    Recommendations for Other Services       Precautions / Restrictions Precautions Precautions: Fall Restrictions Weight Bearing Restrictions: Yes LLE Weight Bearing: Weight bearing as tolerated    Mobility  Bed Mobility Overal bed mobility: Independent                Transfers Overall transfer level: Independent                  Ambulation/Gait Ambulation/Gait assistance: Modified independent (Device/Increase time) Gait Distance (Feet): 80 Feet Assistive device: Rolling walker (2 wheeled) Gait Pattern/deviations: Step-through pattern     General Gait Details: Pt demonstarted safe gait and was able to progress to step throughout pattern with minimal vcs for heel strike to toe off pattern.     Stairs Stairs: Yes Stairs assistance: Modified independent (Device/Increase time) Stair Management: One rail Right Number of Stairs: 4 General stair comments: Pt demonstrate safe ability to perform stairs   Wheelchair Mobility    Modified Rankin (Stroke Patients Only)       Balance                                            Cognition Arousal/Alertness: Awake/alert Behavior During Therapy: WFL for tasks assessed/performed Overall Cognitive Status: Within Functional Limits for tasks assessed                                 General Comments: Pt is A and O x 4 and pleasant throughout. no cognitive deficits      Exercises Total Joint Exercises Ankle Circles/Pumps: Other (comment)    General Comments        Pertinent Vitals/Pain Pain Assessment: No/denies pain Pain Location: right knee Pain Intervention(s): Limited activity within patient's tolerance;Monitored during session;Repositioned;Ice applied    Home Living                      Prior Function            PT Goals (current goals can now be found in the care plan section) Acute Rehab PT Goals Patient Stated Goal: " Get better to go home" Progress  towards PT goals: Progressing toward goals    Frequency    BID      PT Plan Current plan remains appropriate    Co-evaluation              AM-PAC PT "6 Clicks" Mobility   Outcome Measure  Help needed turning from your back to your side while in a flat bed without using bedrails?: None Help needed moving from lying on your back to sitting on the side of a flat bed without using bedrails?: None Help needed moving to and from a bed to a chair (including a wheelchair)?: None Help needed standing up from a chair using your arms (e.g., wheelchair or bedside chair)?: None Help needed to walk in hospital room?: None Help needed climbing 3-5 steps with a railing? : None 6 Click Score: 24    End of Session  Equipment Utilized During Treatment: Gait belt Activity Tolerance: Patient tolerated treatment well Patient left: with chair alarm set;with call bell/phone within reach Nurse Communication: Mobility status PT Visit Diagnosis: Pain;Difficulty in walking, not elsewhere classified (R26.2);Muscle weakness (generalized) (M62.81);Unsteadiness on feet (R26.81) Pain - Right/Left: Left Pain - part of body: Knee     Time: 7092-9574 PT Time Calculation (min) (ACUTE ONLY): 30 min  Charges:  $Gait Training: 8-22 mins $Therapeutic Exercise: 8-22 mins                     Jetta Lout PTA 01/12/20, 9:21 AM

## 2020-01-12 NOTE — Progress Notes (Signed)
Md updated no new orders but encouraged patient to use IS pt did 25 times. Will continue to monitor

## 2020-01-12 NOTE — Progress Notes (Signed)
Patient discharging home. Instructions and prescriptions given to patient, verbalized understanding. Wife at bedside to transport patient home.

## 2020-01-12 NOTE — Progress Notes (Signed)
Pt temp was 100.5 gave tylenol and pt c/o being hot and temp is now 100.7 paged md with vs and to wait for any new orders pt denies chest pain, dizziness, or lightheadedness. Will continue to monitor

## 2020-01-13 DIAGNOSIS — F329 Major depressive disorder, single episode, unspecified: Secondary | ICD-10-CM | POA: Diagnosis not present

## 2020-01-13 DIAGNOSIS — E785 Hyperlipidemia, unspecified: Secondary | ICD-10-CM | POA: Diagnosis not present

## 2020-01-13 DIAGNOSIS — Z471 Aftercare following joint replacement surgery: Secondary | ICD-10-CM | POA: Diagnosis not present

## 2020-01-13 DIAGNOSIS — K219 Gastro-esophageal reflux disease without esophagitis: Secondary | ICD-10-CM | POA: Diagnosis not present

## 2020-01-13 DIAGNOSIS — Z86010 Personal history of colonic polyps: Secondary | ICD-10-CM | POA: Diagnosis not present

## 2020-01-13 DIAGNOSIS — I10 Essential (primary) hypertension: Secondary | ICD-10-CM | POA: Diagnosis not present

## 2020-01-13 DIAGNOSIS — G4730 Sleep apnea, unspecified: Secondary | ICD-10-CM | POA: Diagnosis not present

## 2020-01-13 DIAGNOSIS — I252 Old myocardial infarction: Secondary | ICD-10-CM | POA: Diagnosis not present

## 2020-01-13 DIAGNOSIS — I2510 Atherosclerotic heart disease of native coronary artery without angina pectoris: Secondary | ICD-10-CM | POA: Diagnosis not present

## 2020-01-13 DIAGNOSIS — Z7984 Long term (current) use of oral hypoglycemic drugs: Secondary | ICD-10-CM | POA: Diagnosis not present

## 2020-01-13 DIAGNOSIS — K2270 Barrett's esophagus without dysplasia: Secondary | ICD-10-CM | POA: Diagnosis not present

## 2020-01-13 DIAGNOSIS — E119 Type 2 diabetes mellitus without complications: Secondary | ICD-10-CM | POA: Diagnosis not present

## 2020-01-13 DIAGNOSIS — Z96653 Presence of artificial knee joint, bilateral: Secondary | ICD-10-CM | POA: Diagnosis not present

## 2020-01-13 DIAGNOSIS — Z951 Presence of aortocoronary bypass graft: Secondary | ICD-10-CM | POA: Diagnosis not present

## 2020-01-13 DIAGNOSIS — Z6835 Body mass index (BMI) 35.0-35.9, adult: Secondary | ICD-10-CM | POA: Diagnosis not present

## 2020-01-13 DIAGNOSIS — Z7901 Long term (current) use of anticoagulants: Secondary | ICD-10-CM | POA: Diagnosis not present

## 2020-01-13 DIAGNOSIS — F419 Anxiety disorder, unspecified: Secondary | ICD-10-CM | POA: Diagnosis not present

## 2020-01-18 DIAGNOSIS — Z471 Aftercare following joint replacement surgery: Secondary | ICD-10-CM | POA: Diagnosis not present

## 2020-01-25 DIAGNOSIS — Z96652 Presence of left artificial knee joint: Secondary | ICD-10-CM | POA: Diagnosis not present

## 2020-01-25 DIAGNOSIS — M25662 Stiffness of left knee, not elsewhere classified: Secondary | ICD-10-CM | POA: Diagnosis not present

## 2020-01-25 DIAGNOSIS — M25562 Pain in left knee: Secondary | ICD-10-CM | POA: Diagnosis not present

## 2020-01-25 DIAGNOSIS — G8929 Other chronic pain: Secondary | ICD-10-CM | POA: Diagnosis not present

## 2020-01-25 DIAGNOSIS — M6281 Muscle weakness (generalized): Secondary | ICD-10-CM | POA: Diagnosis not present

## 2020-01-28 DIAGNOSIS — Z0000 Encounter for general adult medical examination without abnormal findings: Secondary | ICD-10-CM | POA: Diagnosis not present

## 2020-01-28 DIAGNOSIS — E1159 Type 2 diabetes mellitus with other circulatory complications: Secondary | ICD-10-CM | POA: Diagnosis not present

## 2020-01-28 DIAGNOSIS — F419 Anxiety disorder, unspecified: Secondary | ICD-10-CM | POA: Diagnosis not present

## 2020-01-28 DIAGNOSIS — E785 Hyperlipidemia, unspecified: Secondary | ICD-10-CM | POA: Diagnosis not present

## 2020-01-28 DIAGNOSIS — Z125 Encounter for screening for malignant neoplasm of prostate: Secondary | ICD-10-CM | POA: Diagnosis not present

## 2020-01-28 DIAGNOSIS — I10 Essential (primary) hypertension: Secondary | ICD-10-CM | POA: Diagnosis not present

## 2020-01-28 DIAGNOSIS — K2100 Gastro-esophageal reflux disease with esophagitis, without bleeding: Secondary | ICD-10-CM | POA: Diagnosis not present

## 2020-01-28 DIAGNOSIS — Z951 Presence of aortocoronary bypass graft: Secondary | ICD-10-CM | POA: Diagnosis not present

## 2020-01-29 ENCOUNTER — Ambulatory Visit: Payer: PPO | Attending: Internal Medicine

## 2020-01-29 DIAGNOSIS — G8929 Other chronic pain: Secondary | ICD-10-CM | POA: Diagnosis not present

## 2020-01-29 DIAGNOSIS — M25562 Pain in left knee: Secondary | ICD-10-CM | POA: Diagnosis not present

## 2020-01-29 DIAGNOSIS — M25662 Stiffness of left knee, not elsewhere classified: Secondary | ICD-10-CM | POA: Diagnosis not present

## 2020-01-29 DIAGNOSIS — Z96652 Presence of left artificial knee joint: Secondary | ICD-10-CM | POA: Diagnosis not present

## 2020-01-29 DIAGNOSIS — M6281 Muscle weakness (generalized): Secondary | ICD-10-CM | POA: Diagnosis not present

## 2020-01-29 DIAGNOSIS — Z23 Encounter for immunization: Secondary | ICD-10-CM | POA: Insufficient documentation

## 2020-01-29 NOTE — Progress Notes (Signed)
   Covid-19 Vaccination Clinic  Name:  [name]    MRN: [id number] DOB: 1956-05-05  01/29/2020  Mr. Lazarz was observed post Covid-19 immunization for 15 minutes without incident. He was provided with Vaccine Information Sheet and instruction to access the V-Safe system.   Mr. Razzano was instructed to call 911 with any severe reactions post vaccine: Marland Kitchen Difficulty breathing  . Swelling of face and throat  . A fast heartbeat  . A bad rash all over body  . Dizziness and weakness   Immunizations Administered    Name Date Dose VIS Date Route   Pfizer COVID-19 Vaccine 01/29/2020  9:07 AM 0.3 mL 11/02/2019 Intramuscular   Manufacturer: [employer], [employer]   Lot: [id number]   NDC: [address]-0

## 2020-01-31 DIAGNOSIS — G8929 Other chronic pain: Secondary | ICD-10-CM | POA: Diagnosis not present

## 2020-01-31 DIAGNOSIS — Z96652 Presence of left artificial knee joint: Secondary | ICD-10-CM | POA: Diagnosis not present

## 2020-01-31 DIAGNOSIS — M6281 Muscle weakness (generalized): Secondary | ICD-10-CM | POA: Diagnosis not present

## 2020-01-31 DIAGNOSIS — Z125 Encounter for screening for malignant neoplasm of prostate: Secondary | ICD-10-CM | POA: Diagnosis not present

## 2020-01-31 DIAGNOSIS — I10 Essential (primary) hypertension: Secondary | ICD-10-CM | POA: Diagnosis not present

## 2020-01-31 DIAGNOSIS — M25562 Pain in left knee: Secondary | ICD-10-CM | POA: Diagnosis not present

## 2020-01-31 DIAGNOSIS — E785 Hyperlipidemia, unspecified: Secondary | ICD-10-CM | POA: Diagnosis not present

## 2020-01-31 DIAGNOSIS — E1159 Type 2 diabetes mellitus with other circulatory complications: Secondary | ICD-10-CM | POA: Diagnosis not present

## 2020-01-31 DIAGNOSIS — M25662 Stiffness of left knee, not elsewhere classified: Secondary | ICD-10-CM | POA: Diagnosis not present

## 2020-02-05 DIAGNOSIS — G8929 Other chronic pain: Secondary | ICD-10-CM | POA: Diagnosis not present

## 2020-02-05 DIAGNOSIS — Z96652 Presence of left artificial knee joint: Secondary | ICD-10-CM | POA: Diagnosis not present

## 2020-02-05 DIAGNOSIS — M25662 Stiffness of left knee, not elsewhere classified: Secondary | ICD-10-CM | POA: Diagnosis not present

## 2020-02-05 DIAGNOSIS — M6281 Muscle weakness (generalized): Secondary | ICD-10-CM | POA: Diagnosis not present

## 2020-02-05 DIAGNOSIS — M25562 Pain in left knee: Secondary | ICD-10-CM | POA: Diagnosis not present

## 2020-02-08 DIAGNOSIS — M6281 Muscle weakness (generalized): Secondary | ICD-10-CM | POA: Diagnosis not present

## 2020-02-12 DIAGNOSIS — M6281 Muscle weakness (generalized): Secondary | ICD-10-CM | POA: Diagnosis not present

## 2020-02-14 DIAGNOSIS — Z96652 Presence of left artificial knee joint: Secondary | ICD-10-CM | POA: Diagnosis not present

## 2020-02-14 DIAGNOSIS — M25662 Stiffness of left knee, not elsewhere classified: Secondary | ICD-10-CM | POA: Diagnosis not present

## 2020-02-14 DIAGNOSIS — G8929 Other chronic pain: Secondary | ICD-10-CM | POA: Diagnosis not present

## 2020-02-14 DIAGNOSIS — M25562 Pain in left knee: Secondary | ICD-10-CM | POA: Diagnosis not present

## 2020-02-14 DIAGNOSIS — M6281 Muscle weakness (generalized): Secondary | ICD-10-CM | POA: Diagnosis not present

## 2020-02-19 DIAGNOSIS — M25562 Pain in left knee: Secondary | ICD-10-CM | POA: Diagnosis not present

## 2020-02-19 DIAGNOSIS — M6281 Muscle weakness (generalized): Secondary | ICD-10-CM | POA: Diagnosis not present

## 2020-02-19 DIAGNOSIS — M25662 Stiffness of left knee, not elsewhere classified: Secondary | ICD-10-CM | POA: Diagnosis not present

## 2020-02-19 DIAGNOSIS — G8929 Other chronic pain: Secondary | ICD-10-CM | POA: Diagnosis not present

## 2020-02-19 DIAGNOSIS — Z96652 Presence of left artificial knee joint: Secondary | ICD-10-CM | POA: Diagnosis not present

## 2020-02-21 DIAGNOSIS — M6281 Muscle weakness (generalized): Secondary | ICD-10-CM | POA: Diagnosis not present

## 2020-02-21 DIAGNOSIS — M25662 Stiffness of left knee, not elsewhere classified: Secondary | ICD-10-CM | POA: Diagnosis not present

## 2020-02-21 DIAGNOSIS — M25562 Pain in left knee: Secondary | ICD-10-CM | POA: Diagnosis not present

## 2020-02-21 DIAGNOSIS — G8929 Other chronic pain: Secondary | ICD-10-CM | POA: Diagnosis not present

## 2020-02-21 DIAGNOSIS — Z96652 Presence of left artificial knee joint: Secondary | ICD-10-CM | POA: Diagnosis not present

## 2020-02-26 DIAGNOSIS — M25562 Pain in left knee: Secondary | ICD-10-CM | POA: Diagnosis not present

## 2020-02-26 DIAGNOSIS — Z96652 Presence of left artificial knee joint: Secondary | ICD-10-CM | POA: Diagnosis not present

## 2020-02-26 DIAGNOSIS — G8929 Other chronic pain: Secondary | ICD-10-CM | POA: Diagnosis not present

## 2020-02-26 DIAGNOSIS — M25662 Stiffness of left knee, not elsewhere classified: Secondary | ICD-10-CM | POA: Diagnosis not present

## 2020-02-26 DIAGNOSIS — M6281 Muscle weakness (generalized): Secondary | ICD-10-CM | POA: Diagnosis not present

## 2020-02-28 DIAGNOSIS — M25662 Stiffness of left knee, not elsewhere classified: Secondary | ICD-10-CM | POA: Diagnosis not present

## 2020-02-28 DIAGNOSIS — G8929 Other chronic pain: Secondary | ICD-10-CM | POA: Diagnosis not present

## 2020-02-28 DIAGNOSIS — Z96652 Presence of left artificial knee joint: Secondary | ICD-10-CM | POA: Diagnosis not present

## 2020-02-28 DIAGNOSIS — M6281 Muscle weakness (generalized): Secondary | ICD-10-CM | POA: Diagnosis not present

## 2020-02-28 DIAGNOSIS — M25562 Pain in left knee: Secondary | ICD-10-CM | POA: Diagnosis not present

## 2020-02-29 DIAGNOSIS — M1712 Unilateral primary osteoarthritis, left knee: Secondary | ICD-10-CM | POA: Diagnosis not present

## 2020-03-06 DIAGNOSIS — I10 Essential (primary) hypertension: Secondary | ICD-10-CM | POA: Diagnosis not present

## 2020-04-11 DIAGNOSIS — Z96652 Presence of left artificial knee joint: Secondary | ICD-10-CM | POA: Diagnosis not present

## 2020-04-30 ENCOUNTER — Telehealth: Payer: Self-pay | Admitting: Cardiovascular Disease

## 2020-04-30 NOTE — Telephone Encounter (Signed)
Spoke with patient and he really feels that it is costochondritis flare. He has had this before and thinks that it was similar. He also reports that his 66 year old daughter who had blood clot and coded 3 times. He also has an elderly mother who had back surgery which has caused additional stressors. So he wanted to come in and discuss all of this with provider. Advised that if his chest pain returns, does not ease with nitro, or he develops shortness of breath to please go to local ED for evaluation. Confirmed appointment for tomorrow with instructions to please arrive early at the [facility] and they will direct him down here. He verbalized understanding of our conversation, agreement with plan, and had no further questions at this time.

## 2020-04-30 NOTE — Telephone Encounter (Signed)
Pt c/o of Chest Pain: STAT if CP now or developed within 24 hours  1. Are you having CP right now? No   2. Are you experiencing any other symptoms (ex. SOB, nausea, vomiting, sweating)? Sob with an episode   3. How long have you been experiencing CP? 1 month coincide with stress of daughter ( blood clot coded in ed at [facility] )  4. Is your CP continuous or coming and going?  Comes and goes   5. Have you taken Nitroglycerin? No but taking tylenol at night as a routine  ? Scheduled to see caitlin walker 6/10 at 11am

## 2020-05-01 ENCOUNTER — Ambulatory Visit: Payer: PPO | Admitting: Family

## 2020-05-01 ENCOUNTER — Other Ambulatory Visit: Payer: Self-pay

## 2020-05-01 ENCOUNTER — Encounter: Payer: Self-pay | Admitting: Family

## 2020-05-01 VITALS — BP 158/90 | HR 70 | Ht 70.0 in | Wt 258.4 lb

## 2020-05-01 DIAGNOSIS — I951 Orthostatic hypotension: Secondary | ICD-10-CM | POA: Diagnosis not present

## 2020-05-01 DIAGNOSIS — I25118 Atherosclerotic heart disease of native coronary artery with other forms of angina pectoris: Secondary | ICD-10-CM

## 2020-05-01 DIAGNOSIS — E782 Mixed hyperlipidemia: Secondary | ICD-10-CM

## 2020-05-01 DIAGNOSIS — R0789 Other chest pain: Secondary | ICD-10-CM | POA: Diagnosis not present

## 2020-05-01 MED ORDER — NITROGLYCERIN 0.4 MG SL SUBL: 0.4000 mg | SUBLINGUAL_TABLET | SUBLINGUAL | 3 refills | Status: AC | PRN

## 2020-05-01 NOTE — Progress Notes (Signed)
Office Visit    Patient Name: [name] Date of Encounter: 05/01/2020  Primary Care Provider:  Dorothey Baseman, MD Primary Cardiologist:  Julien Nordmann, MD Electrophysiologist:  None   Chief Complaint    [name] is a 64 y.o. male with a hx of CAD s/p stent to RCA and Cx 2010 and CABG 2014 with SVG to OM, LIMA to LAD, postoperative atrial fibrillation, HLD, OSA on CPAP, chronic L sided chest pain, DM2, GERD, orthostasis presents today for follow up of CAD   Past Medical History    Past Medical History:  Diagnosis Date  . Anxiety   . Atherosclerosis of coronary artery bypass graft with angina pectoris (HCC)   . Barrett's esophagus   . Benign enlargement of prostate   . Chronic constipation   . Colon polyp   . Coronary artery disease   . Depression   . GERD (gastroesophageal reflux disease)   . H/O esophageal spasm   . Hyperlipidemia   . Hypertension   . MI (myocardial infarction) (HCC) 2011 & 2014   x 2  . Sleep apnea     after weight loss no need for CPAP  . Type 2 diabetes mellitus (HCC)    Past Surgical History:  Procedure Laterality Date  . abscess cellulitis resection     . ANAL FISSURE REPAIR    . CARDIAC CATHETERIZATION  2011  . COLONOSCOPY    . COLONOSCOPY WITH PROPOFOL N/A 06/20/2019   Procedure: COLONOSCOPY WITH PROPOFOL;  Surgeon: Toledo, Boykin Nearing, MD;  Location: [facility] ENDOSCOPY;  Service: Gastroenterology;  Laterality: N/A;  . CORONARY ANGIOPLASTY WITH STENT PLACEMENT  2011   stent placement x 3 @ [facility]; Dr. Darrold Junker  . CORONARY ARTERY BYPASS GRAFT  2014   CABG x 2  . ESOPHAGOGASTRODUODENOSCOPY (EGD) WITH PROPOFOL N/A 06/20/2019   Procedure: ESOPHAGOGASTRODUODENOSCOPY (EGD) WITH PROPOFOL;  Surgeon: Toledo, Boykin Nearing, MD;  Location: [facility] ENDOSCOPY;  Service: Gastroenterology;  Laterality: N/A;  . EXCISION MASS NECK  1985   faliculitis, incision became infected and was in hospital for 21 days  . MOUTH SURGERY  2019  . PARTIAL KNEE  ARTHROPLASTY Right 07/19/2017   Procedure: UNICOMPARTMENTAL KNEE;  Surgeon: Christena Flake, MD;  Location: [facility] ORS;  Service: Orthopedics;  Laterality: Right;  . TEE WITH CARDIOVERSION    . TOTAL KNEE ARTHROPLASTY Right 10/23/2018   Procedure: CONVERSION OF PARTIAL TO TOTAL KNEE ARTHROPLASTY;  Surgeon: Christena Flake, MD;  Location: [facility] ORS;  Service: Orthopedics;  Laterality: Right;  . TOTAL KNEE ARTHROPLASTY Left 01/10/2020   Procedure: TOTAL KNEE ARTHROPLASTY;  Surgeon: Christena Flake, MD;  Location: [facility] ORS;  Service: Orthopedics;  Laterality: Left;   Allergies  Allergies  Allergen Reactions  . Oxycodone Other (See Comments)    Hallucinations  . Hydrocodone     hallucinations  . Tramadol     hallucination   History of Present Illness    [name] is a 63 y.o. male with a hx of hx of CAD s/p stent to RCA and Cx 2010 and CABG 2014 with SVG to OM, LIMA to LAD, postoperative atrial fibrillation, HLD, OSA on CPAP, chronic L sided chest pain, DM2, GERD, orthostasis. He was last seen 12/12/19.   Previous stress test 01/26/17 which was low risk with no significant ischemia. He had a normal echocardiogram 02/28/17. His orthostasis has been managed on midodrine.   Underwent R total knee replacement 10/23/18, had some complications related to adverse reaction to  medications and had to be readmitted. Underwent L TKA 01/10/20.   Episode in 08/20/19 of chest pain while camping with his wife.   He spent the night at [place].  Troponin normal x3.  Bilateral lower extremity ultrasound was negative for DVT.  CXR with no acute cardiopulmonary process.  He had a nuclear stress test with no evidence of my cardial ischemia or myocardial scar, normal LV wall motion, LVEF 61%, normal study.  He was started on as needed Ativan.  Clinic visit 11/2019 noted to be very affected recently by the pandemic and stress.  Mother is beginning to show signs of dementia and helping to care for her.  He lost his dog with  significant grief.  He reported no recurrent chest pain since ED visit to [facility].   She has anemia has had numerous stressful events over the last month.  512/21 his daughter was diagnosed with a DVT which traveled and she subsequently coded 3 times and was emergently put on ECMO and intubated.  Tells me she is recuperating well, back at her home, participating in outpatient therapies.  In addition his mother who has some cognitive delays underwent a significant back surgery.  Tells me overall this has been very stressful.  Over the last few weeks he has intermittently had a "sharp, grabbing "pain in his left chest both at rest and with activity.  Has occurred with activity such as driving, reaching for something.  Lasts only seconds.  Will go away with pressing on his chest and leaning forward.  Has not taken nitroglycerin nor Ativan.  Has been taking Tylenol at night.  Tells me in the past when he has had similar symptoms he has had costochondritis which has been relieved by Tylenol.   EKGs/Labs/Other Studies Reviewed:   The following studies were reviewed today:  Lexiscan Myoview 08/21/2019 Perfusion findings: Homogeneous tracer uptake throughout rest and stress imaging  TID ratio: 1.18  Left ventricular wall motion: There is normal contractility of all wall segments.  Left ventricular ejection fraction: 61%.  Right ventricular wall motion: n/a   IMPRESSION Normal rest/Regadenoson myocardial perfusion SPECT study demonstrating no evidence of stress-induced myocardial ischemia or myocardial scar. Left ventricular wall motion is normal. This is a normal study.  EKG:  EKG is ordered today.  The ekg ordered today demonstrates NSR 70 bpm with no acute ST/T wave changes.   Recent Labs: 01/08/2020: ALT 19 01/12/2020: BUN 13; Creatinine, Ser 1.12; Hemoglobin 11.0; Platelets 118; Potassium 3.5; Sodium 135  Recent Lipid Panel    Component Value Date/Time   CHOL  11/19/2009 0705    114         ATP III CLASSIFICATION:  <200     mg/dL   Desirable  580-998  mg/dL   Borderline High  >=338    mg/dL   High          TRIG 75 11/19/2009 0705   HDL 29 (L) 11/19/2009 0705   CHOLHDL 3.9 11/19/2009 0705   VLDL 15 11/19/2009 0705   LDLCALC  11/19/2009 0705    70        Total Cholesterol/HDL:CHD Risk Coronary Heart Disease Risk Table                     Men   Women  1/2 Average Risk   3.4   3.3  Average Risk       5.0   4.4  2 X Average Risk   9.6  7.1  3 X Average Risk  23.4   11.0        Use the calculated Patient Ratio above and the CHD Risk Table to determine the patient's CHD Risk.        ATP III CLASSIFICATION (LDL):  <100     mg/dL   Optimal  578-469  mg/dL   Near or Above                    Optimal  130-159  mg/dL   Borderline  629-528  mg/dL   High  >413     mg/dL   Very High    Home Medications   Current Meds  Medication Sig  . acetaminophen (TYLENOL) 325 MG tablet Take 650 mg by mouth every 6 (six) hours as needed for moderate pain or headache.   . albuterol (PROVENTIL HFA;VENTOLIN HFA) 108 (90 BASE) MCG/ACT inhaler Inhale 1-2 puffs into the lungs every 6 (six) hours as needed for wheezing or shortness of breath.   Marland Kitchen atorvastatin (LIPITOR) 40 MG tablet Take 40 mg by mouth daily.   . cetirizine (ZYRTEC) 10 MG tablet Take 10 mg by mouth daily as needed for allergies.   . diphenhydrAMINE (BENADRYL) 50 MG capsule Take 50 mg by mouth at bedtime as needed for sleep.  Marland Kitchen ezetimibe (ZETIA) 10 MG tablet Take 1 tablet (10 mg total) by mouth daily.  . fluticasone-salmeterol (ADVAIR HFA) 115-21 MCG/ACT inhaler Inhale 2 puffs into the lungs 2 (two) times daily.  Marland Kitchen LORazepam (ATIVAN) 1 MG tablet Take 1 mg by mouth daily as needed for anxiety.   . metFORMIN (GLUCOPHAGE) 850 MG tablet Take 850 mg by mouth daily with breakfast.   . midodrine (PROAMATINE) 10 MG tablet Take 2 tablets (20 mg total) by mouth daily. (Patient taking differently: Take 10 mg by mouth daily. )  .  nitroGLYCERIN (NITROSTAT) 0.4 MG SL tablet Place 1 tablet (0.4 mg total) under the tongue every 5 (five) minutes as needed for chest pain.  Marland Kitchen oxyCODONE (OXY IR/ROXICODONE) 5 MG immediate release tablet Take 1-2 tablets (5-10 mg total) by mouth every 4 (four) hours as needed for moderate pain.  . RABEprazole (ACIPHEX) 20 MG tablet Take 20 mg by mouth daily.   . traMADol (ULTRAM) 50 MG tablet Take 1 tablet (50 mg total) by mouth every 6 (six) hours as needed for moderate pain.  Marland Kitchen venlafaxine XR (EFFEXOR-XR) 75 MG 24 hr capsule Take 150 mg by mouth daily.   . [DISCONTINUED] nitroGLYCERIN (NITROSTAT) 0.4 MG SL tablet Place 1 tablet (0.4 mg total) under the tongue every 5 (five) minutes as needed for chest pain.     Review of Systems      Review of Systems  Constitutional: Negative for chills, fever and malaise/fatigue.  Cardiovascular: Positive for chest pain. Negative for dyspnea on exertion, leg swelling, near-syncope, orthopnea, palpitations and syncope.  Respiratory: Negative for cough, shortness of breath and wheezing.   Gastrointestinal: Negative for nausea and vomiting.  Neurological: Negative for dizziness, light-headedness and weakness.  Psychiatric/Behavioral: The patient is nervous/anxious (endorses recent stress).    All other systems reviewed and are otherwise negative except as noted above.  Physical Exam    VS:  BP (!) 158/90 (BP Location: Left Arm, Patient Position: Sitting, Cuff Size: Normal)   Pulse 70   Ht 5\' 10"  (1.778 m)   Wt 258 lb 6 oz (117.2 kg)   SpO2 98%   BMI 37.07 kg/m  , BMI  Body mass index is 37.07 kg/m. GEN: Well nourished, overweight, well developed, in no acute distress. HEENT: normal. Neck: Supple, no JVD, carotid bruits, or masses. Cardiac: RRR, no murmurs, rubs, or gallops. No clubbing, cyanosis, edema.  Radials/PT 2+ and equal bilaterally.  Respiratory:  Respirations regular and unlabored, clear to auscultation bilaterally. GI: Soft, nontender,  nondistended. MS: No deformity or atrophy. Left chest wall is tender on palpation. Skin: Warm and dry, no rash. Neuro:  Strength and sensation are intact. Psych: Normal affect.   Assessment & Plan    1. CAD s/p CABG - Normal stress test and echo 2018.  Normal stress test 07/2019 at [facility]. Continue GDMT aspirin, statin.  No beta-blocker secondary to history of hypotension.  Encouraged low-sodium healthy diet and regular physical activity.  Over the last 2 weeks he has been having a "sharp, grabbing "sensation in his chest and his chest is tender on palpation.  EKG today without acute ST/T wave changes.  We discussed that this is likely anxiety, musculoskeletal in the setting of multiple recent stressors. No EKG changes or symptoms concerning for pericarditis. No indication for ischemic evaluation at this time.  Recommended ibuprofen 600 mg twice daily for 3 days and then as needed.  He will call our office if chest pain does not improve. Nitroglycerin refill provided.   2. HLD, LDL goal <70 -01/2020 LDL 66.Marland Kitchen Continue atorvastatin 40 mg and Zetia 10 mg.  3. Orthostatic hypotension - No recent lightheadedness, dizziness, near syncope.  Continue present dose of midodrine 10 mg daily.  We did discuss potentially decreasing dose to 5 mg daily as his blood pressure has been elevated recently in the setting of stress but will readdress at follow-up.  4. Anxiety-continue to follow with PCP.  Likely contributor to chest pain.   Disposition: Follow up in 3-4 month(s) with Dr. Wallie Renshaw, NP 05/01/2020, 12:50 PM

## 2020-05-01 NOTE — Patient Instructions (Addendum)
Medication Instructions:  Your physician has recommended you make the following change in your medication:   TAKE Ibuprofen 600mg  (3 tablets) twice per day for 3 days  A refill of your nitroglycerin were sent.   If you notice your blood pressure is consistently more than 130/80 even after the current season of stress passes, we could consider decreasing your dose of Midodrine to 5mg .   *If you need a refill on your cardiac medications before your next appointment, please call your pharmacy*  Lab Work: No lab work ordered today.   Testing/Procedures: Your EKG today shows normal sinus rhythm with no acute changes.   Follow-Up: At [facility], you and your health needs are our priority.  As part of our continuing mission to provide you with exceptional heart care, we have created designated Provider Care Teams.  These Care Teams include your primary Cardiologist (physician) and Advanced Practice Providers (APPs -  Physician Assistants and Nurse Practitioners) who all work together to provide you with the care you need, when you need it.  We recommend signing up for the patient portal called "MyChart".  Sign up information is provided on this After Visit Summary.  MyChart is used to connect with patients for Virtual Visits (Telemedicine).  Patients are able to view lab/test results, encounter notes, upcoming appointments, etc.  Non-urgent messages can be sent to your provider as well.   To learn more about what you can do with MyChart, go to .    Your next appointment:   3-4 month(s)  The format for your next appointment:   In Person  Provider:    You may see [facility], MD or one of the following Advanced Practice Providers on your designated Care Team:    [email or web address], NP  Julien Nordmann, PA-C  Nicolasa Ducking, PA-C  Eula Listen, NP

## 2020-06-09 DIAGNOSIS — S61259A Open bite of unspecified finger without damage to nail, initial encounter: Secondary | ICD-10-CM | POA: Diagnosis not present

## 2020-06-09 DIAGNOSIS — W540XXA Bitten by dog, initial encounter: Secondary | ICD-10-CM | POA: Diagnosis not present

## 2020-06-09 DIAGNOSIS — S61451A Open bite of right hand, initial encounter: Secondary | ICD-10-CM | POA: Diagnosis not present

## 2020-07-14 DIAGNOSIS — Z96652 Presence of left artificial knee joint: Secondary | ICD-10-CM | POA: Diagnosis not present

## 2020-07-30 DIAGNOSIS — I25708 Atherosclerosis of coronary artery bypass graft(s), unspecified, with other forms of angina pectoris: Secondary | ICD-10-CM | POA: Diagnosis not present

## 2020-07-30 DIAGNOSIS — K2100 Gastro-esophageal reflux disease with esophagitis, without bleeding: Secondary | ICD-10-CM | POA: Diagnosis not present

## 2020-07-30 DIAGNOSIS — E785 Hyperlipidemia, unspecified: Secondary | ICD-10-CM | POA: Diagnosis not present

## 2020-07-30 DIAGNOSIS — I10 Essential (primary) hypertension: Secondary | ICD-10-CM | POA: Diagnosis not present

## 2020-07-30 DIAGNOSIS — E1159 Type 2 diabetes mellitus with other circulatory complications: Secondary | ICD-10-CM | POA: Diagnosis not present

## 2020-08-11 ENCOUNTER — Telehealth: Payer: Self-pay | Admitting: Cardiovascular Disease

## 2020-08-11 NOTE — Telephone Encounter (Signed)
Patient calling after PCP advised patient to stop taking ibuprofen. Patient was seen by Luther Parody who recommended taking 600mg  ibuprofen in morning and evening for chest wall pain. Patient states this stopped all the concerning pain. PCP told patient to stop as this can hurt kidneys and liver. Patient is curious if there are any other options for patient. He experimented with nitro and that did not help  Please advise

## 2020-08-11 NOTE — Telephone Encounter (Signed)
At clinic visit 04/2020 recommended 3 day course of Ibuprofen 600mg  twice daily then return to as needed.  Recent lab work with primary care provider shows normal kidney and liver function. He could use ibuprofen 400-600mg  as needed, would recommend no more than twice per week to protect kidney function. He may also utilize Tylenol. His previous chest discomfort was thought to be musculoskeletal as it was tender on palpation, a heat pack is often helpful for relieving this type of pain. Low suspicion his chest wall pain is cardiac in origin which is likely why Nitroglycerin is not helpful.   Keep upcoming follow up with Dr. .   Mariah Milling, NP

## 2020-08-11 NOTE — Telephone Encounter (Signed)
Spoke with patient and reviewed provider recommendations and reviewed upcoming appointment next week. He was very appreciative for the call back and was not aware of appointment next week. He states that the ibuprofen really helped his pain and that worked very well for him. Let him know that next week when he saw Dr. Mariah Milling we can review this again to see his thoughts. He verbalized understanding of our conversation, agreeable with plan, and had no further questions at this time.

## 2020-08-18 NOTE — Progress Notes (Signed)
Cardiology Office Note  Date:  08/19/2020   ID:  [name], DOB 06/29/1956, MRN [id number]  PCP:  Dorothey BasemanBronstein, David, MD   Chief Complaint  Patient presents with  . Follow-up    3 Month follow up and c/o some chest discomfort. Medications verbally reviewed with patient.     HPI:  [name] is a 64 year-old gentleman with obesity,  coronary artery disease, stenting to his RCA and circumflex in 2010,  CABG March 2014 with vein graft to the OM, LIMA to the LAD,  Postop atrial fib hyperlipidemia,  obstructive sleep apnea with compliance of his CPAP,  chronic left-sided chest pain,  worsening shortness of breath  diabetes, esophageal spasm, bulging disc in his neck, GERD Chronic orthostasis, etiology unclear who presents for routine follow-up of his coronary artery disease  Last seen in clinic by one of our providers June 2021 Episode of chest pain September 2020 Troponin normal, stress test low risk, no ischemia  Stress at home, mother with dementia Continued atypical chest pain, felt to be musculoskeletal or costochondritis was told to take ibuprofen  Denies orthostasis symptoms on midodrine  Still with left side chest pain, painful to push on left chest, Extending into arm put and back Hurts when he is driving and lifting his left arm up  Denies any pain on exertion, otherwise very active in the yard  EKG personally reviewed by myself on todays visit Shows normal sinus rhythm with rate 72 bpm no significant ST-T wave changes  Lab Results  Component Value Date   CHOL  11/19/2009    114        ATP III CLASSIFICATION:  <200     mg/dL   Desirable  [id number]  mg/dL   Borderline High  >=213>=240    mg/dL   High          HDL 29 (L) 11/19/2009   LDLCALC  11/19/2009    70        Total Cholesterol/HDL:CHD Risk Coronary Heart Disease Risk Table                     Men   Women  1/2 Average Risk   3.4   3.3  Average Risk       5.0   4.4  2 X Average Risk   9.6   7.1  3 X  Average Risk  23.4   11.0        Use the calculated Patient Ratio above and the CHD Risk Table to determine the patient's CHD Risk.        ATP III CLASSIFICATION (LDL):  <100     mg/dL   Optimal  [id number]  mg/dL   Near or Above                    Optimal  130-159  mg/dL   Borderline  [id number]  mg/dL   High  >528>190     mg/dL   Very High   TRIG 75 41/32/440112/29/2010     Other past medical history  October 31 2015 having episode of near syncope, got up from a recliner, felt very lightheaded, recovered without syncope One episode approximately 10 days prior to that, getting up from a lawnmower to a upright position, had near-syncope syncope At that time we increased his midodrine up to 10 mg, Florinef 0.1 mg every other day  Previous orthostasis numbers/blood pressure numbers at home continue to showdrops in blood  pressure with standing sometimes 80 systolic  Previous Orthostatics done in the office shows systolic pressure supine 133/86 with heart rate 69 Sitting 107/76 heart rate 75 Standing 98/63 with heart rate 77 Repeat standing 104/73 with heart rate 91  Previous episode of falling down or falling into people at a baseball game  He did complete pulmonary rehabilitation where he was exercising 3 days per week.  Denies significant shortness of breath or chest discomfort  He is on disability for his exercise intolerance Previously reported having lightheaded spells getting out of the car. Lasix dosing was decreased   lab work reviewed with him showing total cholesterol 144, LDL 80   PMH:   has a past medical history of Anxiety, Atherosclerosis of coronary artery bypass graft with angina pectoris (HCC), Barrett's esophagus, Benign enlargement of prostate, Chronic constipation, Colon polyp, Coronary artery disease, Depression, GERD (gastroesophageal reflux disease), H/O esophageal spasm, Hyperlipidemia, Hypertension, MI (myocardial infarction) (HCC) (2011 & 2014), Sleep apnea, and  Type 2 diabetes mellitus (HCC).  PSH:    Past Surgical History:  Procedure Laterality Date  . abscess cellulitis resection     . ANAL FISSURE REPAIR    . CARDIAC CATHETERIZATION  2011  . COLONOSCOPY    . COLONOSCOPY WITH PROPOFOL N/A 06/20/2019   Procedure: COLONOSCOPY WITH PROPOFOL;  Surgeon: Toledo, Boykin Nearing, MD;  Location: [facility];  Service: Gastroenterology;  Laterality: N/A;  . CORONARY ANGIOPLASTY WITH STENT PLACEMENT  2011   stent placement x 3 @ [facility]; Dr. Darrold Junker  . CORONARY ARTERY BYPASS GRAFT  2014   CABG x 2  . ESOPHAGOGASTRODUODENOSCOPY (EGD) WITH PROPOFOL N/A 06/20/2019   Procedure: ESOPHAGOGASTRODUODENOSCOPY (EGD) WITH PROPOFOL;  Surgeon: Toledo, Boykin Nearing, MD;  Location: [facility];  Service: Gastroenterology;  Laterality: N/A;  . EXCISION MASS NECK  1985   faliculitis, incision became infected and was in hospital for 21 days  . MOUTH SURGERY  2019  . PARTIAL KNEE ARTHROPLASTY Right 07/19/2017   Procedure: UNICOMPARTMENTAL KNEE;  Surgeon: Christena Flake, MD;  Location: [facility];  Service: Orthopedics;  Laterality: Right;  . TEE WITH CARDIOVERSION    . TOTAL KNEE ARTHROPLASTY Right 10/23/2018   Procedure: CONVERSION OF PARTIAL TO TOTAL KNEE ARTHROPLASTY;  Surgeon: Christena Flake, MD;  Location: [facility];  Service: Orthopedics;  Laterality: Right;  . TOTAL KNEE ARTHROPLASTY Left 01/10/2020   Procedure: TOTAL KNEE ARTHROPLASTY;  Surgeon: Christena Flake, MD;  Location: [facility];  Service: Orthopedics;  Laterality: Left;    Current Outpatient Medications  Medication Sig Dispense Refill  . acetaminophen (TYLENOL) 325 MG tablet Take 650 mg by mouth every 6 (six) hours as needed for moderate pain or headache.     . albuterol (PROVENTIL HFA;VENTOLIN HFA) 108 (90 BASE) MCG/ACT inhaler Inhale 1-2 puffs into the lungs every 6 (six) hours as needed for wheezing or shortness of breath.     . alfuzosin (UROXATRAL) 10 MG 24 hr tablet Take 10 mg by mouth daily.    Marland Kitchen atorvastatin  (LIPITOR) 40 MG tablet Take 40 mg by mouth daily.     . cetirizine (ZYRTEC) 10 MG tablet Take 10 mg by mouth daily as needed for allergies.     . diphenhydrAMINE (BENADRYL) 50 MG capsule Take 50 mg by mouth at bedtime as needed for sleep.    Marland Kitchen ezetimibe (ZETIA) 10 MG tablet Take 1 tablet (10 mg total) by mouth daily. 90 tablet 3  . fluticasone-salmeterol (ADVAIR HFA) 115-21 MCG/ACT inhaler Inhale 2 puffs  into the lungs 2 (two) times daily. 1 Inhaler 6  . LORazepam (ATIVAN) 1 MG tablet Take 1 mg by mouth daily as needed for anxiety.     Marland Kitchen losartan (COZAAR) 50 MG tablet Take 50 mg by mouth daily.    . metFORMIN (GLUCOPHAGE) 850 MG tablet Take 850 mg by mouth daily with breakfast.     . metoprolol succinate (TOPROL-XL) 25 MG 24 hr tablet Take by mouth.    . midodrine (PROAMATINE) 10 MG tablet Take 2 tablets (20 mg total) by mouth daily. (Patient taking differently: Take 10 mg by mouth daily. ) 180 tablet 3  . mirabegron ER (MYRBETRIQ) 25 MG TB24 tablet     . nitroGLYCERIN (NITROSTAT) 0.4 MG SL tablet Place 1 tablet (0.4 mg total) under the tongue every 5 (five) minutes as needed for chest pain. 25 tablet 3  . oxyCODONE (OXY IR/ROXICODONE) 5 MG immediate release tablet Take 1-2 tablets (5-10 mg total) by mouth every 4 (four) hours as needed for moderate pain. 60 tablet 0  . RABEprazole (ACIPHEX) 20 MG tablet Take 20 mg by mouth daily.     . traMADol (ULTRAM) 50 MG tablet Take 1 tablet (50 mg total) by mouth every 6 (six) hours as needed for moderate pain. 40 tablet 0  . traZODone (DESYREL) 100 MG tablet TAKE 1/2 TO 1 TABLET BY MOUTH NIGHTLY.    . venlafaxine XR (EFFEXOR-XR) 75 MG 24 hr capsule Take 150 mg by mouth daily.      No current facility-administered medications for this visit.     Allergies:   Oxycodone, Hydrocodone, and Tramadol   Social History:  The patient  reports that he has never smoked. He has never used smokeless tobacco. He reports that he does not drink alcohol and does not  use drugs.   Family History:   family history includes Hyperlipidemia in his father and mother; Hypertension in his father and mother.   Review of Systems  Constitutional: Negative.   HENT: Negative.   Respiratory: Negative.   Cardiovascular: Negative.        Left-sided chest pain on palpation radiating to armpit and left back  Gastrointestinal: Negative.   Musculoskeletal: Negative.   Neurological: Negative.   Psychiatric/Behavioral: Negative.   All other systems reviewed and are negative.   PHYSICAL EXAM: VS:  BP 140/88 (BP Location: Left Arm, Patient Position: Sitting, Cuff Size: Normal)   Pulse 72   Ht 5\' 10"  (1.778 m)   Wt 268 lb (121.6 kg)   SpO2 98%   BMI 38.45 kg/m  , BMI Body mass index is 38.45 kg/m. Constitutional:  oriented to person, place, and time. No distress.  HENT:  Head: Grossly normal Eyes:  no discharge. No scleral icterus.  Neck: No JVD, no carotid bruits  Cardiovascular: Regular rate and rhythm, no murmurs appreciated Pulmonary/Chest: Clear to auscultation bilaterally, no wheezes or rails Abdominal: Soft.  no distension.  no tenderness.  Musculoskeletal: Normal range of motion Reproducible chest pain on palpation left mediastinal area left pectoral area radiating to left armpit left scapular area Neurological:  normal muscle tone. Coordination normal. No atrophy Skin: Skin warm and dry Psychiatric: normal affect, pleasant   Recent Labs: 01/08/2020: ALT 19 01/12/2020: BUN 13; Creatinine, Ser 1.12; Hemoglobin 11.0; Platelets 118; Potassium 3.5; Sodium 135    Lipid Panel Reviewed through care everywhere  Wt Readings from Last 3 Encounters:  08/19/20 268 lb (121.6 kg)  05/01/20 258 lb 6 oz (117.2 kg)  01/10/20 245  lb (111.1 kg)     ASSESSMENT AND PLAN:  Chest pain Musculoskeletal injury, recommended continued NSAID, icing, light stretching, if symptoms persist would talk with chiropractic For no relief could refer to Dr. Yves Dill  Coronary  artery disease involving native coronary artery of native heart without angina pectoris - Plan: EKG 12-Lead Currently with no symptoms of angina. No further workup at this time. Continue current medication regimen.  Hyperlipidemia, unspecified hyperlipidemia type - Plan: EKG 12-Lead Cholesterol is at goal on the current lipid regimen. No changes to the medications were made.   Orthostatic hypotension - Plan: EKG 12-Lead Blood pressure stable on midodrine No changes made  Shortness of breath - Plan: EKG 12-Lead Recommend regular walking program  S/P CABG x 2 - Plan: EKG 12-Lead Plan as above, currently with no anginal symptoms, no further testing    Total encounter time more than 25 minutes  Greater than 50% was spent in counseling and coordination of care with the patient   Orders Placed This Encounter  Procedures  . EKG 12-Lead     Signed, Dossie Arbour, M.D., Ph.D. 08/19/2020  [facility] [place], [place] [phone]

## 2020-08-19 ENCOUNTER — Other Ambulatory Visit: Payer: Self-pay

## 2020-08-19 ENCOUNTER — Encounter: Payer: Self-pay | Admitting: Cardiovascular Disease

## 2020-08-19 ENCOUNTER — Ambulatory Visit: Payer: PPO | Admitting: Cardiovascular Disease

## 2020-08-19 VITALS — BP 140/88 | HR 72 | Ht 70.0 in | Wt 268.0 lb

## 2020-08-19 DIAGNOSIS — E1159 Type 2 diabetes mellitus with other circulatory complications: Secondary | ICD-10-CM | POA: Diagnosis not present

## 2020-08-19 DIAGNOSIS — I951 Orthostatic hypotension: Secondary | ICD-10-CM

## 2020-08-19 DIAGNOSIS — E782 Mixed hyperlipidemia: Secondary | ICD-10-CM

## 2020-08-19 DIAGNOSIS — I25118 Atherosclerotic heart disease of native coronary artery with other forms of angina pectoris: Secondary | ICD-10-CM | POA: Diagnosis not present

## 2020-08-19 NOTE — Patient Instructions (Addendum)
[facility]: [phone]  Medication Instructions:  Aleve twice a day PRN   If you need a refill on your cardiac medications before your next appointment, please call your pharmacy.    Lab work: No new labs needed   If you have labs (blood work) drawn today and your tests are completely normal, you will receive your results only by: Marland Kitchen MyChart Message (if you have MyChart) OR . A paper copy in the mail If you have any lab test that is abnormal or we need to change your treatment, we will call you to review the results.   Testing/Procedures: No new testing needed   Follow-Up: At [facility], you and your health needs are our priority.  As part of our continuing mission to provide you with exceptional heart care, we have created designated Provider Care Teams.  These Care Teams include your primary Cardiologist (physician) and Advanced Practice Providers (APPs -  Physician Assistants and Nurse Practitioners) who all work together to provide you with the care you need, when you need it.  . You will need a follow up appointment in 6 months  . Providers on your designated Care Team:   . Nicolasa Ducking, NP . Eula Listen, PA-C . Marisue Ivan, PA-C  Any Other Special Instructions Will Be Listed Below (If Applicable).  COVID-19 Vaccine Information can be found at: [email or web address] For questions related to vaccine distribution or appointments, please email [email or web address] or call [phone].

## 2020-09-05 DIAGNOSIS — H903 Sensorineural hearing loss, bilateral: Secondary | ICD-10-CM | POA: Diagnosis not present

## 2020-10-03 ENCOUNTER — Other Ambulatory Visit: Payer: Self-pay | Admitting: Cardiovascular Disease

## 2020-11-30 DIAGNOSIS — Z20822 Contact with and (suspected) exposure to covid-19: Secondary | ICD-10-CM | POA: Diagnosis not present

## 2020-12-03 DIAGNOSIS — R059 Cough, unspecified: Secondary | ICD-10-CM | POA: Diagnosis not present

## 2021-01-23 DIAGNOSIS — E785 Hyperlipidemia, unspecified: Secondary | ICD-10-CM | POA: Diagnosis not present

## 2021-01-23 DIAGNOSIS — Z125 Encounter for screening for malignant neoplasm of prostate: Secondary | ICD-10-CM | POA: Diagnosis not present

## 2021-01-23 DIAGNOSIS — I10 Essential (primary) hypertension: Secondary | ICD-10-CM | POA: Diagnosis not present

## 2021-01-28 DIAGNOSIS — R1110 Vomiting, unspecified: Secondary | ICD-10-CM | POA: Diagnosis not present

## 2021-01-28 DIAGNOSIS — R059 Cough, unspecified: Secondary | ICD-10-CM | POA: Diagnosis not present

## 2021-01-28 DIAGNOSIS — R197 Diarrhea, unspecified: Secondary | ICD-10-CM | POA: Diagnosis not present

## 2021-02-10 ENCOUNTER — Ambulatory Visit: Payer: PPO | Admitting: Cardiovascular Disease

## 2021-02-11 ENCOUNTER — Other Ambulatory Visit: Payer: Self-pay | Admitting: Family

## 2021-02-20 DIAGNOSIS — K2100 Gastro-esophageal reflux disease with esophagitis, without bleeding: Secondary | ICD-10-CM | POA: Diagnosis not present

## 2021-02-20 DIAGNOSIS — N138 Other obstructive and reflux uropathy: Secondary | ICD-10-CM | POA: Diagnosis not present

## 2021-02-20 DIAGNOSIS — Z23 Encounter for immunization: Secondary | ICD-10-CM | POA: Diagnosis not present

## 2021-02-20 DIAGNOSIS — I10 Essential (primary) hypertension: Secondary | ICD-10-CM | POA: Diagnosis not present

## 2021-02-20 DIAGNOSIS — E1159 Type 2 diabetes mellitus with other circulatory complications: Secondary | ICD-10-CM | POA: Diagnosis not present

## 2021-02-20 DIAGNOSIS — N401 Enlarged prostate with lower urinary tract symptoms: Secondary | ICD-10-CM | POA: Diagnosis not present

## 2021-02-20 DIAGNOSIS — E785 Hyperlipidemia, unspecified: Secondary | ICD-10-CM | POA: Diagnosis not present

## 2021-02-20 DIAGNOSIS — Z1331 Encounter for screening for depression: Secondary | ICD-10-CM | POA: Diagnosis not present

## 2021-02-20 DIAGNOSIS — Z0000 Encounter for general adult medical examination without abnormal findings: Secondary | ICD-10-CM | POA: Diagnosis not present

## 2021-03-10 NOTE — Progress Notes (Signed)
Cardiology Office Note  Date:  03/11/2021   ID:  [name], DOB November 05, 1956, MRN [id number]  PCP:  Dorothey Baseman, MD   Chief Complaint  Patient presents with  . 6 month follow up     Patient c/o shortness of breath on exertion. Medications reviewed by the patient verbally.     HPI:  [name] is a 65 year-old gentleman with obesity,  coronary artery disease, stenting to his RCA and circumflex in 2010,  CABG March 2014 with vein graft to the OM, LIMA to the LAD,  Postop atrial fib hyperlipidemia,  obstructive sleep apnea with compliance of his CPAP,  chronic left-sided chest pain,  worsening shortness of breath  diabetes,  esophageal spasm, bulging disc in his neck, GERD Chronic orthostasis, etiology unclear, maintained on midodrine covid x 2 infections,  1/22 and 01/2021 positive who presents for routine follow-up of his coronary artery disease  Last seen in clinic 07/2020 Episode of chest pain September 2020 Troponin normal, stress test low risk, no ischemia  Takes care of mother, dementia, lives independent   Prior hx of atypical chest pain  More SOB, up stairs Weight up from 222 to 268 pounds since 2019 Ok on flat, more sx of hills and stairs More difficulty singing, No regular exercise program Likes to go camping, living in his RV on trips  Denies near syncope or syncope symptoms, continues to take midodrine  Lab work reviewed Total chol 122, LDL 62,  EKG personally reviewed by myself on todays visit Shows normal sinus rhythm with rate 72 bpm no significant ST-T wave changes  Lab Results  Component Value Date   CHOL  11/19/2009    114        ATP III CLASSIFICATION:  <200     mg/dL   Desirable  616-073  mg/dL   Borderline High  >=710    mg/dL   High          HDL 29 (L) 11/19/2009   LDLCALC  11/19/2009    70        Total Cholesterol/HDL:CHD Risk Coronary Heart Disease Risk Table                     Men   Women  1/2 Average Risk   3.4   3.3   Average Risk       5.0   4.4  2 X Average Risk   9.6   7.1  3 X Average Risk  23.4   11.0        Use the calculated Patient Ratio above and the CHD Risk Table to determine the patient's CHD Risk.        ATP III CLASSIFICATION (LDL):  <100     mg/dL   Optimal  626-948  mg/dL   Near or Above                    Optimal  130-159  mg/dL   Borderline  546-270  mg/dL   High  >350     mg/dL   Very High   TRIG 75 09/38/1829     Other past medical history  October 31 2015 having episode of near syncope, got up from a recliner, felt very lightheaded, recovered without syncope One episode approximately 10 days prior to that, getting up from a lawnmower to a upright position, had near-syncope syncope At that time , midodrine up to 10 mg, Florinef 0.1 mg every other  day  Previous orthostasis numbers/blood pressure numbers at home  Showed drops in blood pressure with standing sometimes 80 systolic  Previous episode of falling down or falling into people at a baseball game   He is on disability for his exercise intolerance Previously reported having lightheaded spells getting out of the car. Lasix dosing was decreased    PMH:   has a past medical history of Anxiety, Atherosclerosis of coronary artery bypass graft with angina pectoris (HCC), Barrett's esophagus, Benign enlargement of prostate, Chronic constipation, Colon polyp, Coronary artery disease, Depression, GERD (gastroesophageal reflux disease), H/O esophageal spasm, Hyperlipidemia, Hypertension, MI (myocardial infarction) (HCC) (2011 & 2014), Sleep apnea, and Type 2 diabetes mellitus (HCC).  PSH:    Past Surgical History:  Procedure Laterality Date  . abscess cellulitis resection     . ANAL FISSURE REPAIR    . CARDIAC CATHETERIZATION  2011  . COLONOSCOPY    . COLONOSCOPY WITH PROPOFOL N/A 06/20/2019   Procedure: COLONOSCOPY WITH PROPOFOL;  Surgeon: Toledo, Boykin Nearingeodoro K, MD;  Location: [facility];  Service: Gastroenterology;   Laterality: N/A;  . CORONARY ANGIOPLASTY WITH STENT PLACEMENT  2011   stent placement x 3 @ [facility]; Dr. Darrold JunkerParaschos  . CORONARY ARTERY BYPASS GRAFT  2014   CABG x 2  . ESOPHAGOGASTRODUODENOSCOPY (EGD) WITH PROPOFOL N/A 06/20/2019   Procedure: ESOPHAGOGASTRODUODENOSCOPY (EGD) WITH PROPOFOL;  Surgeon: Toledo, Boykin Nearingeodoro K, MD;  Location: [facility];  Service: Gastroenterology;  Laterality: N/A;  . EXCISION MASS NECK  1985   faliculitis, incision became infected and was in hospital for 21 days  . MOUTH SURGERY  2019  . PARTIAL KNEE ARTHROPLASTY Right 07/19/2017   Procedure: UNICOMPARTMENTAL KNEE;  Surgeon: Christena FlakePoggi, John J, MD;  Location: [facility];  Service: Orthopedics;  Laterality: Right;  . TEE WITH CARDIOVERSION    . TOTAL KNEE ARTHROPLASTY Right 10/23/2018   Procedure: CONVERSION OF PARTIAL TO TOTAL KNEE ARTHROPLASTY;  Surgeon: Christena FlakePoggi, John J, MD;  Location: [facility];  Service: Orthopedics;  Laterality: Right;  . TOTAL KNEE ARTHROPLASTY Left 01/10/2020   Procedure: TOTAL KNEE ARTHROPLASTY;  Surgeon: Christena FlakePoggi, John J, MD;  Location: [facility];  Service: Orthopedics;  Laterality: Left;    Current Outpatient Medications  Medication Sig Dispense Refill  . acetaminophen (TYLENOL) 325 MG tablet Take 650 mg by mouth every 6 (six) hours as needed for moderate pain or headache.     . albuterol (PROVENTIL HFA;VENTOLIN HFA) 108 (90 BASE) MCG/ACT inhaler Inhale 1-2 puffs into the lungs every 6 (six) hours as needed for wheezing or shortness of breath.     . alfuzosin (UROXATRAL) 10 MG 24 hr tablet Take 10 mg by mouth daily.    Marland Kitchen. atorvastatin (LIPITOR) 40 MG tablet Take 40 mg by mouth daily.     . cetirizine (ZYRTEC) 10 MG tablet Take 10 mg by mouth daily as needed for allergies.     . diphenhydrAMINE (BENADRYL) 50 MG capsule Take 50 mg by mouth at bedtime as needed for sleep.    Marland Kitchen. ezetimibe (ZETIA) 10 MG tablet TAKE 1 TABLET BY MOUTH EVERY DAY 90 tablet 0  . fluticasone-salmeterol (ADVAIR HFA) 115-21 MCG/ACT  inhaler Inhale 2 puffs into the lungs 2 (two) times daily. 1 Inhaler 6  . LORazepam (ATIVAN) 1 MG tablet Take 1 mg by mouth daily as needed for anxiety.     Marland Kitchen. losartan (COZAAR) 50 MG tablet Take 50 mg by mouth daily.    . metFORMIN (GLUCOPHAGE) 850 MG tablet Take 850 mg by  mouth daily with breakfast.     . metoprolol succinate (TOPROL-XL) 25 MG 24 hr tablet Take by mouth.    . midodrine (PROAMATINE) 10 MG tablet TAKE 2 TABLETS BY MOUTH EVERY DAY 180 tablet 0  . mirabegron ER (MYRBETRIQ) 25 MG TB24 tablet     . nitroGLYCERIN (NITROSTAT) 0.4 MG SL tablet Place 1 tablet (0.4 mg total) under the tongue every 5 (five) minutes as needed for chest pain. 25 tablet 3  . oxyCODONE (OXY IR/ROXICODONE) 5 MG immediate release tablet Take 1-2 tablets (5-10 mg total) by mouth every 4 (four) hours as needed for moderate pain. 60 tablet 0  . RABEprazole (ACIPHEX) 20 MG tablet Take 20 mg by mouth daily.     . traMADol (ULTRAM) 50 MG tablet Take 1 tablet (50 mg total) by mouth every 6 (six) hours as needed for moderate pain. 40 tablet 0  . traZODone (DESYREL) 100 MG tablet TAKE 1/2 TO 1 TABLET BY MOUTH NIGHTLY.    . venlafaxine XR (EFFEXOR-XR) 75 MG 24 hr capsule Take 150 mg by mouth daily.      No current facility-administered medications for this visit.     Allergies:   Oxycodone, Hydrocodone, and Tramadol   Social History:  The patient  reports that he has never smoked. He has never used smokeless tobacco. He reports that he does not drink alcohol and does not use drugs.   Family History:   family history includes Hyperlipidemia in his father and mother; Hypertension in his father and mother.   Review of Systems  Constitutional: Negative.   HENT: Negative.   Respiratory: Negative.   Cardiovascular: Negative.        Left-sided chest pain on palpation radiating to armpit and left back  Gastrointestinal: Negative.   Musculoskeletal: Negative.   Neurological: Negative.   Psychiatric/Behavioral:  Negative.   All other systems reviewed and are negative.   PHYSICAL EXAM: VS:  BP 128/84 (BP Location: Left Arm, Patient Position: Sitting, Cuff Size: Normal)   Pulse 72   Ht 5\' 10"  (1.778 m)   Wt 268 lb 2 oz (121.6 kg)   SpO2 98%   BMI 38.47 kg/m  , BMI Body mass index is 38.47 kg/m. Constitutional:  oriented to person, place, and time. No distress.  Obese HENT:  Head: Grossly normal Eyes:  no discharge. No scleral icterus.  Neck: No JVD, no carotid bruits  Cardiovascular: Regular rate and rhythm, no murmurs appreciated Pulmonary/Chest: Clear to auscultation bilaterally, no wheezes or rails Abdominal: Soft.  no distension.  no tenderness.  Musculoskeletal: Normal range of motion Neurological:  normal muscle tone. Coordination normal. No atrophy Skin: Skin warm and dry Psychiatric: normal affect, pleasant  Recent Labs: No results found for requested labs within last 8760 hours.    Lipid Panel Reviewed through care everywhere  Wt Readings from Last 3 Encounters:  03/11/21 268 lb 2 oz (121.6 kg)  08/19/20 268 lb (121.6 kg)  05/01/20 258 lb 6 oz (117.2 kg)     ASSESSMENT AND PLAN:  Chest pain Prior history atypical symptoms, no further ischemic work-up needed at this time  Coronary artery disease involving native coronary artery of native heart without angina pectoris - Currently with no symptoms of angina. No further workup at this time. Continue current medication regimen. Lifestyle modification recommended, weight loss in particular  Hyperlipidemia, unspecified hyperlipidemia type  Cholesterol is at goal on the current lipid regimen. No changes to the medications were made. Recommend dietary changes, weight  loss, walking program  Orthostatic hypotension - Plan: EKG 12-Lead Blood pressure stable on midodrine No changes made, will likely be able to wean midodrine on future visits Would start with half dosing twice daily  Shortness of breath -  Exacerbated by  deconditioning, morbid obesity Recommend regular walking program, dietary changes Avoid eating out  S/P CABG x 2 - Plan: EKG 12-Lead Denies anginal symptoms, no further testing    Total encounter time more than 25 minutes  Greater than 50% was spent in counseling and coordination of care with the patient   Orders Placed This Encounter  Procedures  . EKG 12-Lead     Signed, Dossie Arbour, M.D., Ph.D. 03/11/2021  [facility] [place], [place] [phone]

## 2021-03-11 ENCOUNTER — Ambulatory Visit: Payer: PPO | Admitting: Cardiovascular Disease

## 2021-03-11 ENCOUNTER — Other Ambulatory Visit: Payer: Self-pay

## 2021-03-11 ENCOUNTER — Encounter: Payer: Self-pay | Admitting: Cardiovascular Disease

## 2021-03-11 VITALS — BP 128/84 | HR 72 | Ht 70.0 in | Wt 268.1 lb

## 2021-03-11 DIAGNOSIS — E782 Mixed hyperlipidemia: Secondary | ICD-10-CM

## 2021-03-11 DIAGNOSIS — I25118 Atherosclerotic heart disease of native coronary artery with other forms of angina pectoris: Secondary | ICD-10-CM

## 2021-03-11 DIAGNOSIS — E1159 Type 2 diabetes mellitus with other circulatory complications: Secondary | ICD-10-CM

## 2021-03-11 DIAGNOSIS — I951 Orthostatic hypotension: Secondary | ICD-10-CM

## 2021-03-11 DIAGNOSIS — R0789 Other chest pain: Secondary | ICD-10-CM

## 2021-03-11 NOTE — Patient Instructions (Signed)
Medication Instructions:  No changes  If you need a refill on your cardiac medications before your next appointment, please call your pharmacy.    Lab work: No new labs needed   If you have labs (blood work) drawn today and your tests are completely normal, you will receive your results only by: . MyChart Message (if you have MyChart) OR . A paper copy in the mail If you have any lab test that is abnormal or we need to change your treatment, we will call you to review the results.   Testing/Procedures: No new testing needed   Follow-Up: At [facility], you and your health needs are our priority.  As part of our continuing mission to provide you with exceptional heart care, we have created designated Provider Care Teams.  These Care Teams include your primary Cardiologist (physician) and Advanced Practice Providers (APPs -  Physician Assistants and Nurse Practitioners) who all work together to provide you with the care you need, when you need it.  . You will need a follow up appointment in 12 months  . Providers on your designated Care Team:   . Christopher Berge, NP . Ryan Dunn, PA-C . Jacquelyn Visser, PA-C  Any Other Special Instructions Will Be Listed Below (If Applicable).  COVID-19 Vaccine Information can be found at: [email or web address] For questions related to vaccine distribution or appointments, please email [email or web address] or call [phone].     

## 2021-04-24 DIAGNOSIS — I10 Essential (primary) hypertension: Secondary | ICD-10-CM | POA: Diagnosis not present

## 2021-04-24 DIAGNOSIS — E1159 Type 2 diabetes mellitus with other circulatory complications: Secondary | ICD-10-CM | POA: Diagnosis not present

## 2021-05-20 ENCOUNTER — Other Ambulatory Visit: Payer: Self-pay | Admitting: Family

## 2021-05-20 NOTE — Telephone Encounter (Signed)
Rx request sent to pharmacy.  

## 2021-09-04 ENCOUNTER — Other Ambulatory Visit: Payer: Self-pay

## 2021-09-04 MED ORDER — MIDODRINE HCL 10 MG PO TABS: 20.0000 mg | ORAL_TABLET | Freq: Every day | ORAL | 0 refills | Status: DC

## 2021-09-04 NOTE — Telephone Encounter (Signed)
*  STAT* If patient is at the pharmacy, call can be transferred to refill team.   1. Which medications need to be refilled? (please list name of each medication and dose if known) Midodrine  2. Which pharmacy/location (including street and city if local pharmacy) is medication to be sent to? [facility] [address]  3. Do they need a 30 day or 90 day supply? 90

## 2021-09-11 DIAGNOSIS — I951 Orthostatic hypotension: Secondary | ICD-10-CM | POA: Diagnosis not present

## 2021-09-11 DIAGNOSIS — I10 Essential (primary) hypertension: Secondary | ICD-10-CM | POA: Diagnosis not present

## 2021-09-11 DIAGNOSIS — J069 Acute upper respiratory infection, unspecified: Secondary | ICD-10-CM | POA: Diagnosis not present

## 2021-09-11 DIAGNOSIS — E1159 Type 2 diabetes mellitus with other circulatory complications: Secondary | ICD-10-CM | POA: Diagnosis not present

## 2021-09-11 DIAGNOSIS — E785 Hyperlipidemia, unspecified: Secondary | ICD-10-CM | POA: Diagnosis not present

## 2021-09-11 DIAGNOSIS — F32A Depression, unspecified: Secondary | ICD-10-CM | POA: Diagnosis not present

## 2021-10-20 DIAGNOSIS — R053 Chronic cough: Secondary | ICD-10-CM | POA: Diagnosis not present

## 2021-10-21 DIAGNOSIS — R052 Subacute cough: Secondary | ICD-10-CM | POA: Diagnosis not present

## 2021-11-27 DIAGNOSIS — F3342 Major depressive disorder, recurrent, in full remission: Secondary | ICD-10-CM | POA: Diagnosis not present

## 2021-11-27 DIAGNOSIS — D692 Other nonthrombocytopenic purpura: Secondary | ICD-10-CM | POA: Diagnosis not present

## 2021-11-27 DIAGNOSIS — Z7984 Long term (current) use of oral hypoglycemic drugs: Secondary | ICD-10-CM | POA: Diagnosis not present

## 2021-11-27 DIAGNOSIS — I2510 Atherosclerotic heart disease of native coronary artery without angina pectoris: Secondary | ICD-10-CM | POA: Diagnosis not present

## 2021-11-27 DIAGNOSIS — J449 Chronic obstructive pulmonary disease, unspecified: Secondary | ICD-10-CM | POA: Diagnosis not present

## 2021-11-27 DIAGNOSIS — E119 Type 2 diabetes mellitus without complications: Secondary | ICD-10-CM | POA: Diagnosis not present

## 2021-11-27 DIAGNOSIS — Z6836 Body mass index (BMI) 36.0-36.9, adult: Secondary | ICD-10-CM | POA: Diagnosis not present

## 2021-11-27 DIAGNOSIS — Z951 Presence of aortocoronary bypass graft: Secondary | ICD-10-CM | POA: Diagnosis not present

## 2021-11-27 DIAGNOSIS — Z8679 Personal history of other diseases of the circulatory system: Secondary | ICD-10-CM | POA: Diagnosis not present

## 2021-12-03 ENCOUNTER — Other Ambulatory Visit: Payer: Self-pay | Admitting: Cardiovascular Disease

## 2021-12-03 DIAGNOSIS — H40003 Preglaucoma, unspecified, bilateral: Secondary | ICD-10-CM | POA: Diagnosis not present

## 2021-12-03 DIAGNOSIS — E119 Type 2 diabetes mellitus without complications: Secondary | ICD-10-CM | POA: Diagnosis not present

## 2021-12-16 DIAGNOSIS — H2512 Age-related nuclear cataract, left eye: Secondary | ICD-10-CM | POA: Diagnosis not present

## 2021-12-21 ENCOUNTER — Encounter: Payer: Self-pay | Admitting: Ophthalmology

## 2021-12-28 NOTE — Discharge Instructions (Signed)

## 2021-12-30 ENCOUNTER — Encounter
Admission: RE | Disposition: A | Discharge status: Home / Self Care | Payer: Self-pay | Source: Home / Self Care | Attending: Ophthalmology

## 2021-12-30 ENCOUNTER — Ambulatory Visit
Admission: RE | Admit: 2021-12-30 | Discharge: 2021-12-30 | Disposition: A | Discharge status: Home / Self Care | Payer: PPO | Attending: Ophthalmology | Admitting: Ophthalmology

## 2021-12-30 ENCOUNTER — Ambulatory Visit: Payer: PPO | Admitting: Anesthesiology

## 2021-12-30 ENCOUNTER — Other Ambulatory Visit: Payer: Self-pay

## 2021-12-30 ENCOUNTER — Encounter: Payer: Self-pay | Admitting: Ophthalmology

## 2021-12-30 DIAGNOSIS — I252 Old myocardial infarction: Secondary | ICD-10-CM | POA: Diagnosis not present

## 2021-12-30 DIAGNOSIS — K219 Gastro-esophageal reflux disease without esophagitis: Secondary | ICD-10-CM | POA: Insufficient documentation

## 2021-12-30 DIAGNOSIS — E1136 Type 2 diabetes mellitus with diabetic cataract: Secondary | ICD-10-CM | POA: Insufficient documentation

## 2021-12-30 DIAGNOSIS — G4730 Sleep apnea, unspecified: Secondary | ICD-10-CM | POA: Insufficient documentation

## 2021-12-30 DIAGNOSIS — H2512 Age-related nuclear cataract, left eye: Secondary | ICD-10-CM | POA: Insufficient documentation

## 2021-12-30 DIAGNOSIS — Z8673 Personal history of transient ischemic attack (TIA), and cerebral infarction without residual deficits: Secondary | ICD-10-CM | POA: Insufficient documentation

## 2021-12-30 DIAGNOSIS — I10 Essential (primary) hypertension: Secondary | ICD-10-CM | POA: Diagnosis not present

## 2021-12-30 DIAGNOSIS — I2510 Atherosclerotic heart disease of native coronary artery without angina pectoris: Secondary | ICD-10-CM | POA: Diagnosis not present

## 2021-12-30 DIAGNOSIS — H25812 Combined forms of age-related cataract, left eye: Secondary | ICD-10-CM | POA: Diagnosis not present

## 2021-12-30 DIAGNOSIS — Z7984 Long term (current) use of oral hypoglycemic drugs: Secondary | ICD-10-CM | POA: Insufficient documentation

## 2021-12-30 HISTORY — DX: Chronic cough: R05.3

## 2021-12-30 HISTORY — DX: Transient cerebral ischemic attack, unspecified: G45.9

## 2021-12-30 HISTORY — PX: CATARACT EXTRACTION W/PHACO: SHX586

## 2021-12-30 HISTORY — DX: Post covid-19 condition, unspecified: U09.9

## 2021-12-30 HISTORY — DX: COVID-19: U07.1

## 2021-12-30 HISTORY — DX: Presence of external hearing-aid: Z97.4

## 2021-12-30 LAB — GLUCOSE, CAPILLARY
Glucose-Capillary: 123 mg/dL — ABNORMAL HIGH (ref 70–99)
Glucose-Capillary: 122 mg/dL — ABNORMAL HIGH (ref 70–99)

## 2021-12-30 SURGERY — PHACOEMULSIFICATION, CATARACT, WITH IOL INSERTION
Anesthesia: General | Site: Eye | Laterality: Left

## 2021-12-30 MED ORDER — SIGHTPATH DOSE#1 BSS IO SOLN
INTRAOCULAR | Status: DC | PRN
  Administered 2021-12-30: 63 mL via OPHTHALMIC

## 2021-12-30 MED ORDER — BRIMONIDINE TARTRATE-TIMOLOL 0.2-0.5 % OP SOLN
OPHTHALMIC | Status: DC | PRN
  Administered 2021-12-30: 1 [drp] via OPHTHALMIC

## 2021-12-30 MED ORDER — FENTANYL CITRATE (PF) 100 MCG/2ML IJ SOLN
INTRAMUSCULAR | Status: DC | PRN
  Administered 2021-12-30: 50 ug via INTRAVENOUS
  Administered 2021-12-30 (×2): 25 ug via INTRAVENOUS

## 2021-12-30 MED ORDER — SIGHTPATH DOSE#1 NA HYALUR & NA CHOND-NA HYALUR IO KIT
PACK | INTRAOCULAR | Status: DC | PRN
  Administered 2021-12-30: 1 via OPHTHALMIC

## 2021-12-30 MED ORDER — CEFUROXIME OPHTHALMIC INJECTION 1 MG/0.1 ML
INJECTION | OPHTHALMIC | Status: DC | PRN
  Administered 2021-12-30: 1 mg via OPHTHALMIC

## 2021-12-30 MED ORDER — LACTATED RINGERS IV SOLN: INTRAVENOUS | Status: DC

## 2021-12-30 MED ORDER — TETRACAINE HCL 0.5 % OP SOLN
1.0000 [drp] | OPHTHALMIC | Status: DC | PRN
  Administered 2021-12-30 (×3): 1 [drp] via OPHTHALMIC

## 2021-12-30 MED ORDER — MIDAZOLAM HCL 2 MG/2ML IJ SOLN
INTRAMUSCULAR | Status: DC | PRN
  Administered 2021-12-30 (×2): 1 mg via INTRAVENOUS

## 2021-12-30 MED ORDER — ACETAMINOPHEN 160 MG/5ML PO SOLN: 325.0000 mg | ORAL | Status: DC | PRN

## 2021-12-30 MED ORDER — SIGHTPATH DOSE#1 BSS IO SOLN
INTRAOCULAR | Status: DC | PRN
  Administered 2021-12-30: 1 mL via INTRAMUSCULAR

## 2021-12-30 MED ORDER — MOXIFLOXACIN HCL 0.5 % OP SOLN: OPHTHALMIC | Status: DC | PRN

## 2021-12-30 MED ORDER — SIGHTPATH DOSE#1 BSS IO SOLN
INTRAOCULAR | Status: DC | PRN
  Administered 2021-12-30: 15 mL

## 2021-12-30 MED ORDER — ARMC OPHTHALMIC DILATING DROPS
1.0000 "application " | OPHTHALMIC | Status: DC | PRN
  Administered 2021-12-30 (×3): 1 via OPHTHALMIC

## 2021-12-30 MED ORDER — ACETAMINOPHEN 325 MG PO TABS: 325.0000 mg | ORAL_TABLET | ORAL | Status: DC | PRN

## 2021-12-30 SURGICAL SUPPLY — 11 items
CATARACT SUITE SIGHTPATH (MISCELLANEOUS) ×2 IMPLANT
FEE CATARACT SUITE SIGHTPATH (MISCELLANEOUS) ×1 IMPLANT
GLOVE SRG 8 PF TXTR STRL LF DI (GLOVE) ×1 IMPLANT
GLOVE SURG ENC TEXT LTX SZ7.5 (GLOVE) ×2 IMPLANT
GLOVE SURG UNDER POLY LF SZ8 (GLOVE) ×2
LENS IOL TECNIS EYHANCE 16.0 (Intraocular Lens) ×1 IMPLANT
NDL FILTER BLUNT 18X1 1/2 (NEEDLE) ×1 IMPLANT
NEEDLE FILTER BLUNT 18X 1/2SAF (NEEDLE) ×1
NEEDLE FILTER BLUNT 18X1 1/2 (NEEDLE) ×1 IMPLANT
SYR 3ML LL SCALE MARK (SYRINGE) ×2 IMPLANT
WATER STERILE IRR 250ML POUR (IV SOLUTION) ×2 IMPLANT

## 2021-12-30 NOTE — Transfer of Care (Signed)
Immediate Anesthesia Transfer of Care Note  Patient: [name]  Procedure(s) Performed: CATARACT EXTRACTION PHACO AND INTRAOCULAR LENS PLACEMENT (IOC) LEFT DIABETIC (Left: Eye)  Patient Location: PACU  Anesthesia Type: General  Level of Consciousness: awake, alert  and patient cooperative  Airway and Oxygen Therapy: Patient Spontanous Breathing and Patient connected to supplemental oxygen  Post-op Assessment: Post-op Vital signs reviewed, Patient's Cardiovascular Status Stable, Respiratory Function Stable, Patent Airway and No signs of Nausea or vomiting  Post-op Vital Signs: Reviewed and stable  Complications: No notable events documented.

## 2021-12-30 NOTE — Anesthesia Postprocedure Evaluation (Signed)
Anesthesia Post Note  Patient: [name]  Procedure(s) Performed: CATARACT EXTRACTION PHACO AND INTRAOCULAR LENS PLACEMENT (IOC) LEFT DIABETIC (Left: Eye)     Patient location during evaluation: PACU Anesthesia Type: General Level of consciousness: awake and alert Pain management: pain level controlled Vital Signs Assessment: post-procedure vital signs reviewed and stable Respiratory status: spontaneous breathing, nonlabored ventilation, respiratory function stable and patient connected to nasal cannula oxygen Cardiovascular status: stable and blood pressure returned to baseline Postop Assessment: no apparent nausea or vomiting Anesthetic complications: no   No notable events documented.  Alta Corning

## 2021-12-30 NOTE — Anesthesia Preprocedure Evaluation (Signed)
Anesthesia Evaluation  Patient identified by MRN, date of birth, ID band Patient awake    Reviewed: Allergy & Precautions, H&P , NPO status , Patient's Chart, lab work & pertinent test results, reviewed documented beta blocker date and time   Airway Mallampati: II  TM Distance: >3 FB Neck ROM: full    Dental  (+) Partial Upper, Lower Dentures   Pulmonary shortness of breath and with exertion, sleep apnea ,    Pulmonary exam normal breath sounds clear to auscultation       Cardiovascular Exercise Tolerance: Good hypertension, + CAD and + Past MI  Normal cardiovascular exam Rhythm:regular Rate:Normal     Neuro/Psych TIAnegative psych ROS   GI/Hepatic Neg liver ROS, GERD  ,  Endo/Other  diabetes  Renal/GU negative Renal ROS  negative genitourinary   Musculoskeletal   Abdominal   Peds  Hematology negative hematology ROS (+)   Anesthesia Other Findings   Reproductive/Obstetrics negative OB ROS                             Anesthesia Physical Anesthesia Plan  ASA: 2  Anesthesia Plan: General   Post-op Pain Management:    Induction:   PONV Risk Score and Plan:   Airway Management Planned:   Additional Equipment:   Intra-op Plan:   Post-operative Plan:   Informed Consent: I have reviewed the patients History and Physical, chart, labs and discussed the procedure including the risks, benefits and alternatives for the proposed anesthesia with the patient or authorized representative who has indicated his/her understanding and acceptance.     Dental Advisory Given  Plan Discussed with: CRNA and Anesthesiologist  Anesthesia Plan Comments:         Anesthesia Quick Evaluation

## 2021-12-30 NOTE — Progress Notes (Signed)
Patient diaphoretic upon arrival from OR.  Dr. Inez Pilgrim at bedside, stated patient was diaphoretic in OR upon removal of drapes.  Patient denies complaints.  Blood glucose 122.  Will continue to monitor.

## 2021-12-30 NOTE — Op Note (Signed)
OPERATIVE NOTE  [name] [id number] 12/30/2021   PREOPERATIVE DIAGNOSIS:  Nuclear sclerotic cataract left eye. H25.12   POSTOPERATIVE DIAGNOSIS:    Nuclear sclerotic cataract left eye.     PROCEDURE:  Phacoemusification with posterior chamber intraocular lens placement of the left eye  Ultrasound time: Procedure(s) with comments: CATARACT EXTRACTION PHACO AND INTRAOCULAR LENS PLACEMENT (IOC) LEFT DIABETIC (Left) - Diabetic 2.75 00:34.3  LENS:   Implant Name Type Inv. Item Serial No. Manufacturer Lot No. LRB No. Used Action  LENS IOL TECNIS EYHANCE 16.0 - [id number] Intraocular Lens LENS IOL TECNIS EYHANCE 16.0 [id number] [employer]  Left 1 Implanted      SURGEON:  Deirdre Evener, MD   ANESTHESIA:  Topical with tetracaine drops and 2% Xylocaine jelly, augmented with 1% preservative-free intracameral lidocaine.    COMPLICATIONS:  None.   DESCRIPTION OF PROCEDURE:  The patient was identified in the holding room and transported to the operating room and placed in the supine position under the operating microscope.  The left eye was identified as the operative eye and it was prepped and draped in the usual sterile ophthalmic fashion.   A 1 millimeter clear-corneal paracentesis was made at the 1:30 position.  0.5 ml of preservative-free 1% lidocaine was injected into the anterior chamber.  The anterior chamber was filled with Viscoat viscoelastic.  A 2.4 millimeter keratome was used to make a near-clear corneal incision at the 10:30 position.  .  A curvilinear capsulorrhexis was made with a cystotome and capsulorrhexis forceps.  Balanced salt solution was used to hydrodissect and hydrodelineate the nucleus.   Phacoemulsification was then used in stop and chop fashion to remove the lens nucleus and epinucleus.  The remaining cortex was then removed using the irrigation and aspiration handpiece. Provisc was then placed into the capsular bag to distend it for lens placement.  A  lens was then injected into the capsular bag.  The remaining viscoelastic was aspirated.   Wounds were hydrated with balanced salt solution.  The anterior chamber was inflated to a physiologic pressure with balanced salt solution.  No wound leaks were noted. Cefuroxime 0.1 ml of a 10mg /ml solution was injected into the anterior chamber for a dose of 1 mg of intracameral antibiotic at the completion of the case.   Timolol and Brimonidine drops were applied to the eye.  The patient was taken to the recovery room in stable condition without complications of anesthesia or surgery.  [name] 12/30/2021, 10:26 AM

## 2021-12-30 NOTE — H&P (Signed)
[facility]   Primary Care Physician:  Dorothey Baseman, MD Ophthalmologist: Dr. Lockie Mola  Pre-Procedure History & Physical: HPI:  [name] is a 66 y.o. male here for ophthalmic surgery.   Past Medical History:  Diagnosis Date   Anxiety    Atherosclerosis of coronary artery bypass graft with angina pectoris (HCC)    Barrett's esophagus    Benign enlargement of prostate    Chronic constipation    Colon polyp    Coronary artery disease    COVID-19    1/22 3/22   Depression    GERD (gastroesophageal reflux disease)    H/O esophageal spasm    Hyperlipidemia    Hypertension    MI (myocardial infarction) (HCC) 2011 & 2014   x 2   Post-COVID chronic cough    Sleep apnea     after weight loss no need for CPAP   TIA (transient ischemic attack)    "many yrs ago" - no deficits   Type 2 diabetes mellitus (HCC)    Wears hearing aid in both ears     Past Surgical History:  Procedure Laterality Date   abscess cellulitis resection      ANAL FISSURE REPAIR     CARDIAC CATHETERIZATION  2011   COLONOSCOPY     COLONOSCOPY WITH PROPOFOL N/A 06/20/2019   Procedure: COLONOSCOPY WITH PROPOFOL;  Surgeon: Toledo, Boykin Nearing, MD;  Location: [facility];  Service: Gastroenterology;  Laterality: N/A;   CORONARY ANGIOPLASTY WITH STENT PLACEMENT  2011   stent placement x 3 @ [facility]; Dr. Darrold Junker   CORONARY ARTERY BYPASS GRAFT  2014   CABG x 2   ESOPHAGOGASTRODUODENOSCOPY (EGD) WITH PROPOFOL N/A 06/20/2019   Procedure: ESOPHAGOGASTRODUODENOSCOPY (EGD) WITH PROPOFOL;  Surgeon: Toledo, Boykin Nearing, MD;  Location: [facility];  Service: Gastroenterology;  Laterality: N/A;   EXCISION MASS NECK  1985   faliculitis, incision became infected and was in hospital for 21 days   MOUTH SURGERY  2019   PARTIAL KNEE ARTHROPLASTY Right 07/19/2017   Procedure: UNICOMPARTMENTAL KNEE;  Surgeon: Christena Flake, MD;  Location: [facility] ORS;  Service: Orthopedics;  Laterality: Right;   TEE  WITH CARDIOVERSION     TOTAL KNEE ARTHROPLASTY Right 10/23/2018   Procedure: CONVERSION OF PARTIAL TO TOTAL KNEE ARTHROPLASTY;  Surgeon: Christena Flake, MD;  Location: [facility] ORS;  Service: Orthopedics;  Laterality: Right;   TOTAL KNEE ARTHROPLASTY Left 01/10/2020   Procedure: TOTAL KNEE ARTHROPLASTY;  Surgeon: Christena Flake, MD;  Location: [facility] ORS;  Service: Orthopedics;  Laterality: Left;    Prior to Admission medications   Medication Sig Start Date End Date Taking? Authorizing Provider  acetaminophen (TYLENOL) 325 MG tablet Take 650 mg by mouth every 6 (six) hours as needed for moderate pain or headache.    Yes [provider]  albuterol (PROVENTIL HFA;VENTOLIN HFA) 108 (90 BASE) MCG/ACT inhaler Inhale 1-2 puffs into the lungs every 6 (six) hours as needed for wheezing or shortness of breath.    Yes [provider]  alfuzosin (UROXATRAL) 10 MG 24 hr tablet Take 10 mg by mouth daily. 06/02/20  Yes [provider]  Amphetamine-Dextroamphetamine (ADDERALL PO) Take by mouth as needed.   Yes [provider]  ASPIRIN 81 PO Take by mouth daily.   Yes [provider]  atorvastatin (LIPITOR) 40 MG tablet Take 40 mg by mouth daily.    Yes [provider]  cetirizine (ZYRTEC) 10 MG tablet Take 10 mg by mouth  daily as needed for allergies.    Yes [provider]  diphenhydrAMINE (BENADRYL) 50 MG capsule Take 50 mg by mouth at bedtime as needed for sleep.   Yes [provider]  ezetimibe (ZETIA) 10 MG tablet TAKE 1 TABLET BY MOUTH EVERY DAY 05/20/21  Yes Gollan, Tollie Pizza, MD  fluticasone-salmeterol (ADVAIR HFA) 115-21 MCG/ACT inhaler Inhale 2 puffs into the lungs 2 (two) times daily. 05/12/16  Yes Gollan, Tollie Pizza, MD  metFORMIN (GLUCOPHAGE) 850 MG tablet Take 850 mg by mouth daily with breakfast.    Yes [provider]  midodrine (PROAMATINE) 10 MG tablet TAKE 2 TABLETS BY MOUTH EVERY DAY 12/03/21  Yes Gollan, Tollie Pizza, MD   nitroGLYCERIN (NITROSTAT) 0.4 MG SL tablet Place 1 tablet (0.4 mg total) under the tongue every 5 (five) minutes as needed for chest pain. 05/01/20  Yes Alver Sorrow, NP  RABEprazole (ACIPHEX) 20 MG tablet Take 20 mg by mouth daily.    Yes [provider]  traZODone (DESYREL) 100 MG tablet TAKE 1/2 TO 1 TABLET BY MOUTH NIGHTLY. 08/05/20  Yes [provider]  venlafaxine XR (EFFEXOR-XR) 75 MG 24 hr capsule Take 225 mg by mouth daily. 05/23/17  Yes [provider]  losartan (COZAAR) 50 MG tablet Take 50 mg by mouth daily. Patient not taking: Reported on 12/21/2021 07/21/20   [provider]  metoprolol succinate (TOPROL-XL) 25 MG 24 hr tablet Take by mouth. Patient not taking: Reported on 12/21/2021    [provider]  mirabegron ER (MYRBETRIQ) 25 MG TB24 tablet  08/21/19   [provider]    Allergies as of 12/07/2021 - Review Complete 03/11/2021  Allergen Reaction Noted   Oxycodone Other (See Comments) 10/06/2018   Hydrocodone  06/19/2019   Tramadol  06/19/2019    Family History  Problem Relation Age of Onset   Hypertension Mother    Hyperlipidemia Mother    Hypertension Father    Hyperlipidemia Father     Social History   Socioeconomic History   Marital status: Married    Spouse name: Not on file   Number of children: Not on file   Years of education: Not on file   Highest education level: Not on file  Occupational History   Not on file  Tobacco Use   Smoking status: Never   Smokeless tobacco: Never  Vaping Use   Vaping Use: Never used  Substance and Sexual Activity   Alcohol use: No   Drug use: No   Sexual activity: Not Currently  Other Topics Concern   Not on file  Social History Narrative   Not on file   Social Determinants of Health   Financial Resource Strain: Not on file  Food Insecurity: Not on file  Transportation Needs: Not on file  Physical Activity: Not on file  Stress: Not on file  Social  Connections: Not on file  Intimate Partner Violence: Not on file    Review of Systems: See HPI, otherwise negative ROS  Physical Exam: BP (!) 143/84    Pulse 79    Temp (!) 97 F (36.1 C) (Temporal)    Resp 18    Ht 5\' 10"  (1.778 m)    Wt 116.5 kg    SpO2 100%    BMI 36.86 kg/m  General:   Alert,  pleasant and cooperative in NAD Head:  Normocephalic and atraumatic. Lungs:  Clear to auscultation.    Heart:  Regular rate and rhythm.   Impression/Plan:  [name] is here for ophthalmic surgery.  Risks, benefits, limitations, and alternatives regarding ophthalmic surgery have been reviewed with the patient.  Questions have been answered.  All parties agreeable.   Lockie Mola, MD  12/30/2021, 9:30 AM

## 2021-12-31 ENCOUNTER — Encounter: Payer: Self-pay | Admitting: Ophthalmology

## 2022-01-05 DIAGNOSIS — H2511 Age-related nuclear cataract, right eye: Secondary | ICD-10-CM | POA: Diagnosis not present

## 2022-01-11 NOTE — Discharge Instructions (Signed)

## 2022-01-13 ENCOUNTER — Other Ambulatory Visit: Payer: Self-pay

## 2022-01-13 ENCOUNTER — Ambulatory Visit: Payer: PPO | Admitting: Anesthesiology

## 2022-01-13 ENCOUNTER — Ambulatory Visit
Admission: RE | Admit: 2022-01-13 | Discharge: 2022-01-13 | Disposition: A | Discharge status: Home / Self Care | Payer: PPO | Attending: Ophthalmology | Admitting: Ophthalmology

## 2022-01-13 ENCOUNTER — Encounter
Admission: RE | Disposition: A | Discharge status: Home / Self Care | Payer: Self-pay | Source: Home / Self Care | Attending: Ophthalmology

## 2022-01-13 ENCOUNTER — Encounter: Payer: Self-pay | Admitting: Ophthalmology

## 2022-01-13 DIAGNOSIS — G4730 Sleep apnea, unspecified: Secondary | ICD-10-CM | POA: Insufficient documentation

## 2022-01-13 DIAGNOSIS — I2510 Atherosclerotic heart disease of native coronary artery without angina pectoris: Secondary | ICD-10-CM | POA: Insufficient documentation

## 2022-01-13 DIAGNOSIS — K219 Gastro-esophageal reflux disease without esophagitis: Secondary | ICD-10-CM | POA: Diagnosis not present

## 2022-01-13 DIAGNOSIS — E1136 Type 2 diabetes mellitus with diabetic cataract: Secondary | ICD-10-CM | POA: Insufficient documentation

## 2022-01-13 DIAGNOSIS — I110 Hypertensive heart disease with heart failure: Secondary | ICD-10-CM | POA: Insufficient documentation

## 2022-01-13 DIAGNOSIS — I252 Old myocardial infarction: Secondary | ICD-10-CM | POA: Insufficient documentation

## 2022-01-13 DIAGNOSIS — Z8673 Personal history of transient ischemic attack (TIA), and cerebral infarction without residual deficits: Secondary | ICD-10-CM | POA: Diagnosis not present

## 2022-01-13 DIAGNOSIS — I509 Heart failure, unspecified: Secondary | ICD-10-CM | POA: Diagnosis not present

## 2022-01-13 DIAGNOSIS — H2511 Age-related nuclear cataract, right eye: Secondary | ICD-10-CM | POA: Diagnosis not present

## 2022-01-13 DIAGNOSIS — H25811 Combined forms of age-related cataract, right eye: Secondary | ICD-10-CM | POA: Diagnosis not present

## 2022-01-13 HISTORY — PX: CATARACT EXTRACTION W/PHACO: SHX586

## 2022-01-13 LAB — GLUCOSE, CAPILLARY
Glucose-Capillary: 114 mg/dL — ABNORMAL HIGH (ref 70–99)
Glucose-Capillary: 134 mg/dL — ABNORMAL HIGH (ref 70–99)

## 2022-01-13 SURGERY — PHACOEMULSIFICATION, CATARACT, WITH IOL INSERTION
Anesthesia: Monitor Anesthesia Care | Site: Eye | Laterality: Right

## 2022-01-13 MED ORDER — TETRACAINE HCL 0.5 % OP SOLN
1.0000 [drp] | OPHTHALMIC | Status: DC | PRN
  Administered 2022-01-13 (×3): 1 [drp] via OPHTHALMIC

## 2022-01-13 MED ORDER — SIGHTPATH DOSE#1 BSS IO SOLN
INTRAOCULAR | Status: DC | PRN
  Administered 2022-01-13: 53 mL via OPHTHALMIC

## 2022-01-13 MED ORDER — FENTANYL CITRATE (PF) 100 MCG/2ML IJ SOLN
INTRAMUSCULAR | Status: DC | PRN
  Administered 2022-01-13: 100 ug via INTRAVENOUS

## 2022-01-13 MED ORDER — CEFUROXIME OPHTHALMIC INJECTION 1 MG/0.1 ML
INJECTION | OPHTHALMIC | Status: DC | PRN
  Administered 2022-01-13: 1 mg via OPHTHALMIC

## 2022-01-13 MED ORDER — SIGHTPATH DOSE#1 BSS IO SOLN
INTRAOCULAR | Status: DC | PRN
  Administered 2022-01-13: 15 mL

## 2022-01-13 MED ORDER — LACTATED RINGERS IV SOLN: INTRAVENOUS | Status: DC

## 2022-01-13 MED ORDER — ARMC OPHTHALMIC DILATING DROPS
1.0000 "application " | OPHTHALMIC | Status: DC | PRN
  Administered 2022-01-13 (×3): 1 via OPHTHALMIC

## 2022-01-13 MED ORDER — MIDAZOLAM HCL 2 MG/2ML IJ SOLN
INTRAMUSCULAR | Status: DC | PRN
Start: 2022-01-13 — End: 2022-01-13
  Administered 2022-01-13: 2 mg via INTRAVENOUS

## 2022-01-13 MED ORDER — SIGHTPATH DOSE#1 BSS IO SOLN
INTRAOCULAR | Status: DC | PRN
  Administered 2022-01-13: 1 mL via INTRAMUSCULAR

## 2022-01-13 MED ORDER — SIGHTPATH DOSE#1 NA HYALUR & NA CHOND-NA HYALUR IO KIT
PACK | INTRAOCULAR | Status: DC | PRN
  Administered 2022-01-13: 1 via OPHTHALMIC

## 2022-01-13 MED ORDER — BRIMONIDINE TARTRATE-TIMOLOL 0.2-0.5 % OP SOLN
OPHTHALMIC | Status: DC | PRN
  Administered 2022-01-13: 1 [drp] via OPHTHALMIC

## 2022-01-13 SURGICAL SUPPLY — 11 items
CATARACT SUITE SIGHTPATH (MISCELLANEOUS) ×2 IMPLANT
FEE CATARACT SUITE SIGHTPATH (MISCELLANEOUS) ×1 IMPLANT
GLOVE SRG 8 PF TXTR STRL LF DI (GLOVE) ×1 IMPLANT
GLOVE SURG ENC TEXT LTX SZ7.5 (GLOVE) ×2 IMPLANT
GLOVE SURG UNDER POLY LF SZ8 (GLOVE) ×2
LENS IOL TECNIS EYHANCE 16.5 (Intraocular Lens) ×1 IMPLANT
NDL FILTER BLUNT 18X1 1/2 (NEEDLE) ×1 IMPLANT
NEEDLE FILTER BLUNT 18X 1/2SAF (NEEDLE) ×1
NEEDLE FILTER BLUNT 18X1 1/2 (NEEDLE) ×1 IMPLANT
SYR 3ML LL SCALE MARK (SYRINGE) ×2 IMPLANT
WATER STERILE IRR 250ML POUR (IV SOLUTION) ×2 IMPLANT

## 2022-01-13 NOTE — H&P (Signed)
[facility]   Primary Care Physician:  Dorothey Baseman, MD Ophthalmologist: Dr. Lockie Mola  Pre-Procedure History & Physical: HPI:  [name] is a 66 y.o. male here for ophthalmic surgery.   Past Medical History:  Diagnosis Date   Anxiety    Atherosclerosis of coronary artery bypass graft with angina pectoris (HCC)    Barrett's esophagus    Benign enlargement of prostate    Chronic constipation    Colon polyp    Coronary artery disease    COVID-19    1/22 3/22   Depression    GERD (gastroesophageal reflux disease)    H/O esophageal spasm    Hyperlipidemia    Hypertension    MI (myocardial infarction) (HCC) 2011 & 2014   x 2   Post-COVID chronic cough    Sleep apnea     after weight loss no need for CPAP   TIA (transient ischemic attack)    "many yrs ago" - no deficits   Type 2 diabetes mellitus (HCC)    Wears hearing aid in both ears     Past Surgical History:  Procedure Laterality Date   abscess cellulitis resection      ANAL FISSURE REPAIR     CARDIAC CATHETERIZATION  2011   CATARACT EXTRACTION W/PHACO Left 12/30/2021   Procedure: CATARACT EXTRACTION PHACO AND INTRAOCULAR LENS PLACEMENT (IOC) LEFT DIABETIC;  Surgeon: Lockie Mola, MD;  Location: [facility];  Service: Ophthalmology;  Laterality: Left;  Diabetic 2.75 00:34.3   COLONOSCOPY     COLONOSCOPY WITH PROPOFOL N/A 06/20/2019   Procedure: COLONOSCOPY WITH PROPOFOL;  Surgeon: Toledo, Boykin Nearing, MD;  Location: [facility];  Service: Gastroenterology;  Laterality: N/A;   CORONARY ANGIOPLASTY WITH STENT PLACEMENT  2011   stent placement x 3 @ [facility]; Dr. Darrold Junker   CORONARY ARTERY BYPASS GRAFT  2014   CABG x 2   ESOPHAGOGASTRODUODENOSCOPY (EGD) WITH PROPOFOL N/A 06/20/2019   Procedure: ESOPHAGOGASTRODUODENOSCOPY (EGD) WITH PROPOFOL;  Surgeon: Toledo, Boykin Nearing, MD;  Location: [facility];  Service: Gastroenterology;  Laterality: N/A;   EXCISION MASS NECK  1985    faliculitis, incision became infected and was in hospital for 21 days   MOUTH SURGERY  2019   PARTIAL KNEE ARTHROPLASTY Right 07/19/2017   Procedure: UNICOMPARTMENTAL KNEE;  Surgeon: Christena Flake, MD;  Location: [facility];  Service: Orthopedics;  Laterality: Right;   TEE WITH CARDIOVERSION     TOTAL KNEE ARTHROPLASTY Right 10/23/2018   Procedure: CONVERSION OF PARTIAL TO TOTAL KNEE ARTHROPLASTY;  Surgeon: Christena Flake, MD;  Location: [facility];  Service: Orthopedics;  Laterality: Right;   TOTAL KNEE ARTHROPLASTY Left 01/10/2020   Procedure: TOTAL KNEE ARTHROPLASTY;  Surgeon: Christena Flake, MD;  Location: [facility];  Service: Orthopedics;  Laterality: Left;    Prior to Admission medications   Medication Sig Start Date End Date Taking? Authorizing Provider  alfuzosin (UROXATRAL) 10 MG 24 hr tablet Take 10 mg by mouth daily. 06/02/20  Yes [provider]  Amphetamine-Dextroamphetamine (ADDERALL PO) Take by mouth as needed.   Yes [provider]  ASPIRIN 81 PO Take by mouth daily.   Yes [provider]  atorvastatin (LIPITOR) 40 MG tablet Take 40 mg by mouth daily.    Yes [provider]  diphenhydrAMINE (BENADRYL) 50 MG capsule Take 50 mg by mouth at bedtime as needed for sleep.   Yes [provider]  ezetimibe (ZETIA) 10 MG tablet TAKE 1 TABLET BY MOUTH EVERY  DAY 05/20/21  Yes Antonieta Iba, MD  fluticasone-salmeterol (ADVAIR HFA) 408-14 MCG/ACT inhaler Inhale 2 puffs into the lungs 2 (two) times daily. 05/12/16  Yes Gollan, Tollie Pizza, MD  metFORMIN (GLUCOPHAGE) 850 MG tablet Take 850 mg by mouth daily with breakfast.    Yes [provider]  midodrine (PROAMATINE) 10 MG tablet TAKE 2 TABLETS BY MOUTH EVERY DAY 12/03/21  Yes Gollan, Tollie Pizza, MD  RABEprazole (ACIPHEX) 20 MG tablet Take 20 mg by mouth daily.    Yes [provider]  traZODone (DESYREL) 100 MG tablet TAKE 1/2 TO 1 TABLET BY MOUTH NIGHTLY. 08/05/20  Yes [provider]  venlafaxine XR (EFFEXOR-XR) 75 MG 24 hr capsule Take 225 mg by mouth daily. 05/23/17  Yes [provider]  acetaminophen (TYLENOL) 325 MG tablet Take 650 mg by mouth every 6 (six) hours as needed for moderate pain or headache.     [provider]  albuterol (PROVENTIL HFA;VENTOLIN HFA) 108 (90 BASE) MCG/ACT inhaler Inhale 1-2 puffs into the lungs every 6 (six) hours as needed for wheezing or shortness of breath.     [provider]  cetirizine (ZYRTEC) 10 MG tablet Take 10 mg by mouth daily as needed for allergies.     [provider]  losartan (COZAAR) 50 MG tablet Take 50 mg by mouth daily. Patient not taking: Reported on 12/21/2021 07/21/20   [provider]  metoprolol succinate (TOPROL-XL) 25 MG 24 hr tablet Take by mouth. Patient not taking: Reported on 12/21/2021    [provider]  mirabegron ER (MYRBETRIQ) 25 MG TB24 tablet  08/21/19   [provider]  nitroGLYCERIN (NITROSTAT) 0.4 MG SL tablet Place 1 tablet (0.4 mg total) under the tongue every 5 (five) minutes as needed for chest pain. 05/01/20   Alver Sorrow, NP    Allergies as of 12/07/2021 - Review Complete 03/11/2021  Allergen Reaction Noted   Oxycodone Other (See Comments) 10/06/2018   Hydrocodone  06/19/2019   Tramadol  06/19/2019    Family History  Problem Relation Age of Onset   Hypertension Mother    Hyperlipidemia Mother    Hypertension Father    Hyperlipidemia Father     Social History   Socioeconomic History   Marital status: Married    Spouse name: Not on file   Number of children: Not on file   Years of education: Not on file   Highest education level: Not on file  Occupational History   Not on file  Tobacco Use   Smoking status: Never   Smokeless tobacco: Never  Vaping Use   Vaping Use: Never used  Substance and Sexual Activity   Alcohol use: No   Drug use: No   Sexual activity: Not Currently  Other Topics Concern   Not on file   Social History Narrative   Not on file   Social Determinants of Health   Financial Resource Strain: Not on file  Food Insecurity: Not on file  Transportation Needs: Not on file  Physical Activity: Not on file  Stress: Not on file  Social Connections: Not on file  Intimate Partner Violence: Not on file    Review of Systems: See HPI, otherwise negative ROS  Physical Exam: BP (!) 141/83    Pulse 78    Temp 97.8 F (36.6 C) (Temporal)    Resp 18    Ht 5\' 10"  (1.778 m)    Wt 114.8 kg    SpO2 99%  BMI 36.30 kg/m  General:   Alert,  pleasant and cooperative in NAD Head:  Normocephalic and atraumatic. Lungs:  Clear to auscultation.    Heart:  Regular rate and rhythm.   Impression/Plan: [name] is here for ophthalmic surgery.  Risks, benefits, limitations, and alternatives regarding ophthalmic surgery have been reviewed with the patient.  Questions have been answered.  All parties agreeable.   Lockie Mola, MD  01/13/2022, 7:34 AM

## 2022-01-13 NOTE — Op Note (Signed)
LOCATION:  [facility]   PREOPERATIVE DIAGNOSIS:    Nuclear sclerotic cataract right eye. H25.11   POSTOPERATIVE DIAGNOSIS:  Nuclear sclerotic cataract right eye.     PROCEDURE:  Phacoemusification with posterior chamber intraocular lens placement of the right eye   ULTRASOUND TIME: Procedure(s) with comments: CATARACT EXTRACTION PHACO AND INTRAOCULAR LENS PLACEMENT (IOC) RIGHT DIABETIC (Right) - Diabetic 5.34 00:56.7  LENS:   Implant Name Type Inv. Item Serial No. Manufacturer Lot No. LRB No. Used Action  LENS IOL TECNIS EYHANCE 16.5 - [id number] Intraocular Lens LENS IOL TECNIS EYHANCE 16.5 [id number] [employer]  Right 1 Implanted         SURGEON:  Deirdre Evener, MD   ANESTHESIA:  Topical with tetracaine drops and 2% Xylocaine jelly, augmented with 1% preservative-free intracameral lidocaine.    COMPLICATIONS:  None.   DESCRIPTION OF PROCEDURE:  The patient was identified in the holding room and transported to the operating room and placed in the supine position under the operating microscope.  The right eye was identified as the operative eye and it was prepped and draped in the usual sterile ophthalmic fashion.   A 1 millimeter clear-corneal paracentesis was made at the 12:00 position.  0.5 ml of preservative-free 1% lidocaine was injected into the anterior chamber. The anterior chamber was filled with Viscoat viscoelastic.  A 2.4 millimeter keratome was used to make a near-clear corneal incision at the 9:00 position.  A curvilinear capsulorrhexis was made with a cystotome and capsulorrhexis forceps.  Balanced salt solution was used to hydrodissect and hydrodelineate the nucleus.   Phacoemulsification was then used in stop and chop fashion to remove the lens nucleus and epinucleus.  The remaining cortex was then removed using the irrigation and aspiration handpiece. Provisc was then placed into the capsular bag to distend it for lens placement.  A lens was then  injected into the capsular bag.  The remaining viscoelastic was aspirated.   Wounds were hydrated with balanced salt solution.  The anterior chamber was inflated to a physiologic pressure with balanced salt solution.  No wound leaks were noted. Cefuroxime 0.1 ml of a 10mg /ml solution was injected into the anterior chamber for a dose of 1 mg of intracameral antibiotic at the completion of the case.   Timolol and Brimonidine drops were applied to the eye.  The patient was taken to the recovery room in stable condition without complications of anesthesia or surgery.   Haylei Cobin 01/13/2022, 8:28 AM

## 2022-01-13 NOTE — Anesthesia Preprocedure Evaluation (Signed)
Anesthesia Evaluation  Patient identified by MRN, date of birth, ID band Patient awake    Reviewed: Allergy & Precautions, H&P , NPO status , Patient's Chart, lab work & pertinent test results, reviewed documented beta blocker date and time   Airway Mallampati: II  TM Distance: >3 FB Neck ROM: full    Dental no notable dental hx. (+) Partial Upper, Lower Dentures   Pulmonary shortness of breath and with exertion, sleep apnea ,    Pulmonary exam normal breath sounds clear to auscultation       Cardiovascular Exercise Tolerance: Good hypertension, + angina + CAD, + Past MI and +CHF  Normal cardiovascular exam Rhythm:regular Rate:Normal     Neuro/Psych PSYCHIATRIC DISORDERS TIA   GI/Hepatic Neg liver ROS, GERD  ,  Endo/Other  diabetes  Renal/GU negative Renal ROS  negative genitourinary   Musculoskeletal   Abdominal   Peds  Hematology negative hematology ROS (+)   Anesthesia Other Findings   Reproductive/Obstetrics negative OB ROS                             Anesthesia Physical  Anesthesia Plan  ASA: 3  Anesthesia Plan: MAC   Post-op Pain Management: Minimal or no pain anticipated   Induction:   PONV Risk Score and Plan: 1 and Treatment may vary due to age or medical condition, TIVA and Midazolam  Airway Management Planned:   Additional Equipment:   Intra-op Plan:   Post-operative Plan:   Informed Consent: I have reviewed the patients History and Physical, chart, labs and discussed the procedure including the risks, benefits and alternatives for the proposed anesthesia with the patient or authorized representative who has indicated his/her understanding and acceptance.     Dental Advisory Given  Plan Discussed with: CRNA  Anesthesia Plan Comments:         Anesthesia Quick Evaluation

## 2022-01-13 NOTE — Anesthesia Postprocedure Evaluation (Signed)
Anesthesia Post Note  Patient: [name]  Procedure(s) Performed: CATARACT EXTRACTION PHACO AND INTRAOCULAR LENS PLACEMENT (IOC) RIGHT DIABETIC (Right: Eye)     Patient location during evaluation: PACU Anesthesia Type: MAC Level of consciousness: awake and alert and oriented Pain management: satisfactory to patient Vital Signs Assessment: post-procedure vital signs reviewed and stable Respiratory status: spontaneous breathing, nonlabored ventilation and respiratory function stable Cardiovascular status: blood pressure returned to baseline and stable Postop Assessment: Adequate PO intake and No signs of nausea or vomiting Anesthetic complications: no   No notable events documented.  Raliegh Ip

## 2022-01-13 NOTE — Transfer of Care (Signed)
Immediate Anesthesia Transfer of Care Note  Patient: [name]  Procedure(s) Performed: CATARACT EXTRACTION PHACO AND INTRAOCULAR LENS PLACEMENT ([facility]) RIGHT DIABETIC (Right: Eye)  Patient Location: PACU  Anesthesia Type: MAC  Level of Consciousness: awake, alert  and patient cooperative  Airway and Oxygen Therapy: Patient Spontanous Breathing and Patient connected to supplemental oxygen  Post-op Assessment: Post-op Vital signs reviewed, Patient's Cardiovascular Status Stable, Respiratory Function Stable, Patent Airway and No signs of Nausea or vomiting  Post-op Vital Signs: Reviewed and stable  Complications: No notable events documented.

## 2022-01-14 ENCOUNTER — Encounter: Payer: Self-pay | Admitting: Ophthalmology

## 2022-02-05 DIAGNOSIS — T1502XA Foreign body in cornea, left eye, initial encounter: Secondary | ICD-10-CM | POA: Diagnosis not present

## 2022-03-06 ENCOUNTER — Other Ambulatory Visit: Payer: Self-pay | Admitting: Cardiovascular Disease

## 2022-03-18 DIAGNOSIS — E785 Hyperlipidemia, unspecified: Secondary | ICD-10-CM | POA: Diagnosis not present

## 2022-03-18 DIAGNOSIS — N138 Other obstructive and reflux uropathy: Secondary | ICD-10-CM | POA: Diagnosis not present

## 2022-03-18 DIAGNOSIS — N401 Enlarged prostate with lower urinary tract symptoms: Secondary | ICD-10-CM | POA: Diagnosis not present

## 2022-03-18 DIAGNOSIS — Z125 Encounter for screening for malignant neoplasm of prostate: Secondary | ICD-10-CM | POA: Diagnosis not present

## 2022-03-18 DIAGNOSIS — K2100 Gastro-esophageal reflux disease with esophagitis, without bleeding: Secondary | ICD-10-CM | POA: Diagnosis not present

## 2022-03-18 DIAGNOSIS — E538 Deficiency of other specified B group vitamins: Secondary | ICD-10-CM | POA: Diagnosis not present

## 2022-03-18 DIAGNOSIS — I10 Essential (primary) hypertension: Secondary | ICD-10-CM | POA: Diagnosis not present

## 2022-03-18 DIAGNOSIS — Z0000 Encounter for general adult medical examination without abnormal findings: Secondary | ICD-10-CM | POA: Diagnosis not present

## 2022-03-18 DIAGNOSIS — I951 Orthostatic hypotension: Secondary | ICD-10-CM | POA: Diagnosis not present

## 2022-03-18 DIAGNOSIS — F32A Depression, unspecified: Secondary | ICD-10-CM | POA: Diagnosis not present

## 2022-03-18 DIAGNOSIS — E1159 Type 2 diabetes mellitus with other circulatory complications: Secondary | ICD-10-CM | POA: Diagnosis not present

## 2022-03-29 NOTE — Progress Notes (Signed)
Cardiology Office Note ? ?Date:  03/30/2022  ? ?ID:  [name], DOB 04-28-1956, MRN [id number] ? ?PCP:  Dorothey Baseman, MD  ? ?Chief Complaint  ?Patient presents with  ? 12 month follow up   ?  Patient c/o while putting up his camper Easter weekend, he had extreme shortness of breath and saw black for a brief second and since that episode, has noticed his right eyelid drooping. Medications reviewed by the patient verbally.   ? ? ?HPI:  ?[name] is a 66 year-old gentleman with ?obesity,  ?coronary artery disease, stenting to his RCA and circumflex in 2010,  ?CABG March 2014 with vein graft to the OM, LIMA to the LAD,  ?Postop atrial fib ?hyperlipidemia,  ?obstructive sleep apnea with compliance of his CPAP,  ?chronic left-sided chest pain,  ?worsening shortness of breath  ?diabetes,  ?esophageal spasm, bulging disc in his neck, GERD ?Chronic orthostasis, etiology unclear, maintained on midodrine ?covid x 2 infections,  1/22 and 01/2021 positive ?who presents for routine follow-up of his coronary artery disease ? ?Last seen by myself in clinic April 2022 ? ?Since march 2022 had covid second time, ?Since then increased SOB, fatigue, ? ?No regular exercise program ?Weight actually down 10 pounds from last year ? ?Camping recently, last month, "gave out" ?Was sweating, had SOB, went "black" ?Recovered 45 minutes ? ?Concerned that his blood looks orange when he cuts himself ? ?Continues to take care of mother who has dementia ?Going on more RV trips, bought a trailer ? ?Reports he is not taking midodrine ? ?Lab work reviewed ?A1C 7.3 up from low 6 range ?Total chol 169 up from 122 ? ?EKG personally reviewed by myself on todays visit ?Normal sinus rhythm with rate 70 bpm PVC no significant ST-T wave changes ? ?Episode of chest pain September 2020 ?Troponin normal, stress test low risk, no ischemia ? ?Lab Results  ?Component Value Date  ? CHOL  11/19/2009  ?  114        ?ATP III CLASSIFICATION: ? <200     mg/dL    Desirable ? 580-998  mg/dL   Borderline High ? >=338    mg/dL   High ?        ? HDL 29 (L) 11/19/2009  ? LDLCALC  11/19/2009  ?  70        ?Total Cholesterol/HDL:CHD Risk ?Coronary Heart Disease Risk Table ?                    Men   Women ? 1/2 Average Risk   3.4   3.3 ? Average Risk       5.0   4.4 ? 2 X Average Risk   9.6   7.1 ? 3 X Average Risk  23.4   11.0 ?       ?Use the calculated Patient Ratio ?above and the CHD Risk Table ?to determine the patient's CHD Risk. ?       ?ATP III CLASSIFICATION (LDL): ? <100     mg/dL   Optimal ? 250-539  mg/dL   Near or Above ?                   Optimal ? 130-159  mg/dL   Borderline ? 160-189  mg/dL   High ? >767     mg/dL   Very High  ? TRIG 75 11/19/2009  ? ? ? ?Other past medical history ? October 31 2015 having episode  of near syncope, got up from a recliner, felt very lightheaded, recovered without syncope ?One episode approximately 10 days prior to that, getting up from a lawnmower to a upright position, had near-syncope syncope ?At that time , midodrine up to 10 mg, Florinef 0.1 mg every other day ?  ?Previous orthostasis numbers/blood pressure numbers at home  Showed drops in blood pressure with standing sometimes 80 systolic ? ?Previous episode of falling down or falling into people at a baseball game   ? ?He is on disability for his exercise intolerance ?Previously reported having lightheaded spells getting out of the car. Lasix dosing was decreased  ? ? ?PMH:   has a past medical history of Anxiety, Atherosclerosis of coronary artery bypass graft with angina pectoris (HCC), Barrett's esophagus, Benign enlargement of prostate, Chronic constipation, Colon polyp, Coronary artery disease, COVID-19, Depression, GERD (gastroesophageal reflux disease), H/O esophageal spasm, Hyperlipidemia, Hypertension, MI (myocardial infarction) (HCC) (2011 & 2014), Post-COVID chronic cough, Sleep apnea, TIA (transient ischemic attack), Type 2 diabetes mellitus (HCC), and Wears hearing  aid in both ears. ? ?PSH:    ?Past Surgical History:  ?Procedure Laterality Date  ? abscess cellulitis resection     ? ANAL FISSURE REPAIR    ? CARDIAC CATHETERIZATION  2011  ? CATARACT EXTRACTION W/PHACO Left 12/30/2021  ? Procedure: CATARACT EXTRACTION PHACO AND INTRAOCULAR LENS PLACEMENT ([facility]) LEFT DIABETIC;  Surgeon: Lockie Mola, MD;  Location: [facility];  Service: Ophthalmology;  Laterality: Left;  Diabetic ?2.75 ?00:34.3  ? CATARACT EXTRACTION W/PHACO Right 01/13/2022  ? Procedure: CATARACT EXTRACTION PHACO AND INTRAOCULAR LENS PLACEMENT ([facility]) RIGHT DIABETIC;  Surgeon: Lockie Mola, MD;  Location: [facility];  Service: Ophthalmology;  Laterality: Right;  Diabetic ?5.34 ?00:56.7  ? COLONOSCOPY    ? COLONOSCOPY WITH PROPOFOL N/A 06/20/2019  ? Procedure: COLONOSCOPY WITH PROPOFOL;  Surgeon: Toledo, Boykin Nearing, MD;  Location: [facility];  Service: Gastroenterology;  Laterality: N/A;  ? CORONARY ANGIOPLASTY WITH STENT PLACEMENT  2011  ? stent placement x 3 @ [facility]; Dr. Darrold Junker  ? CORONARY ARTERY BYPASS GRAFT  2014  ? CABG x 2  ? ESOPHAGOGASTRODUODENOSCOPY (EGD) WITH PROPOFOL N/A 06/20/2019  ? Procedure: ESOPHAGOGASTRODUODENOSCOPY (EGD) WITH PROPOFOL;  Surgeon: Toledo, Boykin Nearing, MD;  Location: [facility];  Service: Gastroenterology;  Laterality: N/A;  ? EXCISION MASS NECK  1985  ? faliculitis, incision became infected and was in hospital for 21 days  ? MOUTH SURGERY  2019  ? PARTIAL KNEE ARTHROPLASTY Right 07/19/2017  ? Procedure: UNICOMPARTMENTAL KNEE;  Surgeon: Christena Flake, MD;  Location: [facility];  Service: Orthopedics;  Laterality: Right;  ? TEE WITH CARDIOVERSION    ? TOTAL KNEE ARTHROPLASTY Right 10/23/2018  ? Procedure: CONVERSION OF PARTIAL TO TOTAL KNEE ARTHROPLASTY;  Surgeon: Christena Flake, MD;  Location: [facility];  Service: Orthopedics;  Laterality: Right;  ? TOTAL KNEE ARTHROPLASTY Left 01/10/2020  ? Procedure: TOTAL KNEE ARTHROPLASTY;  Surgeon: Christena Flake, MD;   Location: [facility];  Service: Orthopedics;  Laterality: Left;  ? ? ?Current Outpatient Medications  ?Medication Sig Dispense Refill  ? acetaminophen (TYLENOL) 325 MG tablet Take 650 mg by mouth every 6 (six) hours as needed for moderate pain or headache.     ? albuterol (PROVENTIL HFA;VENTOLIN HFA) 108 (90 BASE) MCG/ACT inhaler Inhale 1-2 puffs into the lungs every 6 (six) hours as needed for wheezing or shortness of breath.     ? alfuzosin (UROXATRAL) 10 MG 24 hr tablet Take 10 mg by  mouth daily.    ? Amphetamine-Dextroamphetamine (ADDERALL PO) Take by mouth as needed.    ? ASPIRIN 81 PO Take by mouth daily.    ? atorvastatin (LIPITOR) 40 MG tablet Take 40 mg by mouth daily.     ? cetirizine (ZYRTEC) 10 MG tablet Take 10 mg by mouth daily as needed for allergies.     ? diphenhydrAMINE (BENADRYL) 50 MG capsule Take 50 mg by mouth at bedtime as needed for sleep.    ? ezetimibe (ZETIA) 10 MG tablet TAKE 1 TABLET BY MOUTH EVERY DAY 90 tablet 2  ? fluticasone-salmeterol (ADVAIR HFA) 115-21 MCG/ACT inhaler Inhale 2 puffs into the lungs 2 (two) times daily. 1 Inhaler 6  ? losartan (COZAAR) 50 MG tablet Take 50 mg by mouth daily.    ? metFORMIN (GLUCOPHAGE) 850 MG tablet Take 850 mg by mouth daily with breakfast.     ? metoprolol succinate (TOPROL-XL) 25 MG 24 hr tablet Take by mouth.    ? midodrine (PROAMATINE) 10 MG tablet TAKE 2 TABLETS BY MOUTH EVERY DAY 180 tablet 0  ? mirabegron ER (MYRBETRIQ) 25 MG TB24 tablet     ? nitroGLYCERIN (NITROSTAT) 0.4 MG SL tablet Place 1 tablet (0.4 mg total) under the tongue every 5 (five) minutes as needed for chest pain. 25 tablet 3  ? RABEprazole (ACIPHEX) 20 MG tablet Take 20 mg by mouth daily.     ? traZODone (DESYREL) 100 MG tablet TAKE 1/2 TO 1 TABLET BY MOUTH NIGHTLY.    ? venlafaxine XR (EFFEXOR-XR) 75 MG 24 hr capsule Take 225 mg by mouth daily.    ? ?No current facility-administered medications for this visit.  ? ? ? ?Allergies:   Oxycodone, Hydrocodone, and Tramadol   ? ?Social History:  The patient  reports that he has never smoked. He has never used smokeless tobacco. He reports that he does not drink alcohol and does not use drugs.  ? ?Family History:   family history inc

## 2022-03-30 ENCOUNTER — Encounter: Payer: Self-pay | Admitting: Cardiovascular Disease

## 2022-03-30 ENCOUNTER — Ambulatory Visit: Payer: PPO | Admitting: Cardiovascular Disease

## 2022-03-30 VITALS — BP 110/72 | HR 70 | Ht 69.5 in | Wt 258.2 lb

## 2022-03-30 DIAGNOSIS — I951 Orthostatic hypotension: Secondary | ICD-10-CM | POA: Diagnosis not present

## 2022-03-30 DIAGNOSIS — E782 Mixed hyperlipidemia: Secondary | ICD-10-CM

## 2022-03-30 DIAGNOSIS — E1159 Type 2 diabetes mellitus with other circulatory complications: Secondary | ICD-10-CM

## 2022-03-30 DIAGNOSIS — I25118 Atherosclerotic heart disease of native coronary artery with other forms of angina pectoris: Secondary | ICD-10-CM | POA: Diagnosis not present

## 2022-03-30 DIAGNOSIS — I209 Angina pectoris, unspecified: Secondary | ICD-10-CM

## 2022-03-30 DIAGNOSIS — R0789 Other chest pain: Secondary | ICD-10-CM

## 2022-03-30 NOTE — Patient Instructions (Addendum)
Medication Instructions:  ?No changes ? ?If you need a refill on your cardiac medications before your next appointment, please call your pharmacy.  ? ?Lab work: ?No new labs needed ? ?Testing/Procedures: ? ?Your physician has requested that you have an echocardiogram. Echocardiography is a painless test that uses sound waves to create images of your heart. It provides your doctor with information about the size and shape of your heart and how well your heart?s chambers and valves are working. This procedure takes approximately one hour. There are no restrictions for this procedure.  ? ?[facility] ? ?Your caregiver has ordered a Stress Test with nuclear imaging. The purpose of this test is to evaluate the blood supply to your heart muscle. This procedure is referred to as a "Non-Invasive Stress Test." This is because other than having an IV started in your vein, nothing is inserted or "invades" your body. Cardiac stress tests are done to find areas of poor blood flow to the heart by determining the extent of coronary artery disease (CAD). Some patients exercise on a treadmill, which naturally increases the blood flow to your heart, while others who are  unable to walk on a treadmill due to physical limitations have a pharmacologic/chemical stress agent called Lexiscan . This medicine will mimic walking on a treadmill by temporarily increasing your coronary blood flow.  ? ?Please note: these test may take anywhere between 2-4 hours to complete ? ?PLEASE REPORT TO [facility] MEDICAL MALL ENTRANCE  ?THE VOLUNTEERS AT THE FIRST DESK WILL DIRECT YOU WHERE TO GO ? ?Date of Procedure:_____________________________________ ? ?Arrival Time for Procedure:______________________________ ? ?Instructions regarding medication:  ? ?_X_ : Hold diabetes medication morning of procedure ? ? ?PLEASE NOTIFY THE OFFICE AT LEAST 24 HOURS IN ADVANCE IF YOU ARE UNABLE TO KEEP YOUR APPOINTMENT.  [phone] ?AND  ?PLEASE NOTIFY NUCLEAR MEDICINE AT  [facility] AT LEAST 24 HOURS IN ADVANCE IF YOU ARE UNABLE TO KEEP YOUR APPOINTMENT. [phone] ? ?How to prepare for your Myoview test: ? ?Do not eat or drink after midnight ?No caffeine for 24 hours prior to test ?No smoking 24 hours prior to test. ?Your medication may be taken with water.  If your doctor stopped a medication because of this test, do not take that medication. ?Ladies, please do not wear dresses.  Skirts or pants are appropriate. Please wear a short sleeve shirt. ?No perfume, cologne or lotion. ?Wear comfortable walking shoes. No heels! ? ? ?Follow-Up: ?At [facility], you and your health needs are our priority.  As part of our continuing mission to provide you with exceptional heart care, we have created designated Provider Care Teams.  These Care Teams include your primary Cardiologist (physician) and Advanced Practice Providers (APPs -  Physician Assistants and Nurse Practitioners) who all work together to provide you with the care you need, when you need it. ? ?You will need a follow up appointment in 6 months ? ?Providers on your designated Care Team:   ?Nicolasa Ducking, NP ?Eula Listen, PA-C ?Cadence Fransico Michael, PA-C ? ?COVID-19 Vaccine Information can be found at: [email or web address] For questions related to vaccine distribution or appointments, please email [email or web address] or call [phone].  ? ?

## 2022-04-02 ENCOUNTER — Ambulatory Visit
Admission: RE | Admit: 2022-04-02 | Discharge: 2022-04-02 | Disposition: A | Discharge status: Home / Self Care | Payer: PPO | Source: Ambulatory Visit | Attending: Cardiovascular Disease | Admitting: Cardiovascular Disease

## 2022-04-02 DIAGNOSIS — I25118 Atherosclerotic heart disease of native coronary artery with other forms of angina pectoris: Secondary | ICD-10-CM

## 2022-04-02 LAB — NM MYOCAR MULTI W/SPECT W/WALL MOTION / EF
Base ST Depression (mm): 0 mm
LV dias vol: 54 mL (ref 62–150)
LV sys vol: 18 mL
Nuc Stress EF: 67 %
Peak HR: 90 {beats}/min
Percent HR: 59 %
Rest HR: 65 {beats}/min
Rest Nuclear Isotope Dose: 10.3 mCi
SDS: 0
SRS: 1
SSS: 0
ST Depression (mm): 0 mm
Stress Nuclear Isotope Dose: 31.4 mCi
TID: 0.91

## 2022-04-02 MED ORDER — TECHNETIUM TC 99M TETROFOSMIN IV KIT
10.2900 | PACK | Freq: Once | INTRAVENOUS | Status: AC | PRN
  Administered 2022-04-02: 10.29 via INTRAVENOUS

## 2022-04-02 MED ORDER — TECHNETIUM TC 99M TETROFOSMIN IV KIT
31.4100 | PACK | Freq: Once | INTRAVENOUS | Status: AC | PRN
  Administered 2022-04-02: 31.41 via INTRAVENOUS

## 2022-04-02 MED ORDER — REGADENOSON 0.4 MG/5ML IV SOLN
0.4000 mg | Freq: Once | INTRAVENOUS | Status: AC
  Administered 2022-04-02: 0.4 mg via INTRAVENOUS

## 2022-04-22 ENCOUNTER — Telehealth: Payer: Self-pay

## 2022-04-22 ENCOUNTER — Ambulatory Visit (INDEPENDENT_AMBULATORY_CARE_PROVIDER_SITE_OTHER): Payer: PPO

## 2022-04-22 DIAGNOSIS — I25118 Atherosclerotic heart disease of native coronary artery with other forms of angina pectoris: Secondary | ICD-10-CM | POA: Diagnosis not present

## 2022-04-22 LAB — ECHOCARDIOGRAM COMPLETE
AR max vel: 3.3 cm2
AV Area VTI: 3.46 cm2
AV Area mean vel: 3.38 cm2
AV Mean grad: 2 mmHg
AV Peak grad: 4.5 mmHg
Ao pk vel: 1.06 m/s
Area-P 1/2: 4.15 cm2
S' Lateral: 2.6 cm

## 2022-04-22 NOTE — Telephone Encounter (Signed)
Spoke w/ pt. Reviewed results w/ him.  He verbalizes understanding and is appreciative of the call.

## 2022-04-22 NOTE — Telephone Encounter (Signed)
-----   Message from Antonieta Iba, MD sent at 04/22/2022 12:35 PM EDT ----- Echo Normal left and right ventricular size and function No significant valve disease Overall echo and stress test look good

## 2022-06-08 DIAGNOSIS — Z7982 Long term (current) use of aspirin: Secondary | ICD-10-CM | POA: Diagnosis not present

## 2022-06-08 DIAGNOSIS — D692 Other nonthrombocytopenic purpura: Secondary | ICD-10-CM | POA: Diagnosis not present

## 2022-06-08 DIAGNOSIS — Z6835 Body mass index (BMI) 35.0-35.9, adult: Secondary | ICD-10-CM | POA: Diagnosis not present

## 2022-06-08 DIAGNOSIS — E119 Type 2 diabetes mellitus without complications: Secondary | ICD-10-CM | POA: Diagnosis not present

## 2022-06-08 DIAGNOSIS — J449 Chronic obstructive pulmonary disease, unspecified: Secondary | ICD-10-CM | POA: Diagnosis not present

## 2022-06-08 DIAGNOSIS — I2510 Atherosclerotic heart disease of native coronary artery without angina pectoris: Secondary | ICD-10-CM | POA: Diagnosis not present

## 2022-06-08 DIAGNOSIS — Z7951 Long term (current) use of inhaled steroids: Secondary | ICD-10-CM | POA: Diagnosis not present

## 2022-06-08 DIAGNOSIS — Z8679 Personal history of other diseases of the circulatory system: Secondary | ICD-10-CM | POA: Diagnosis not present

## 2022-06-08 DIAGNOSIS — F3342 Major depressive disorder, recurrent, in full remission: Secondary | ICD-10-CM | POA: Diagnosis not present

## 2022-06-08 DIAGNOSIS — Z7984 Long term (current) use of oral hypoglycemic drugs: Secondary | ICD-10-CM | POA: Diagnosis not present

## 2022-06-12 ENCOUNTER — Other Ambulatory Visit: Payer: Self-pay | Admitting: Cardiovascular Disease

## 2022-07-15 DIAGNOSIS — Z0289 Encounter for other administrative examinations: Secondary | ICD-10-CM | POA: Diagnosis not present

## 2022-10-04 NOTE — Progress Notes (Unsigned)
Cardiology Office Note  Date:  10/05/2022   ID:  IGNACIO LOWDER, DOB 01-28-1956, MRN [id number]  PCP:  Dorothey Baseman, MD   Chief Complaint  Patient presents with   6 month follow up     Patient c/o shortness of breath and chest pressure more frequently with over exertion. Medications reviewed by the patient verbally.     HPI:  Mr. Fulmore is a 66 year-old gentleman with obesity,  coronary artery disease, stenting to his RCA and circumflex in 2010,  CABG March 2014 with vein graft to the OM, LIMA to the LAD,  Postop atrial fib hyperlipidemia,  obstructive sleep apnea with compliance of his CPAP,  chronic left-sided chest pain and shortness of breath  diabetes,  esophageal spasm, bulging disc in his neck, GERD Chronic orthostasis, etiology unclear, previously on midodrine covid x 2 infections,  1/22 and 01/2021 positive who presents for routine follow-up of his coronary artery disease, SOB  Last seen by myself in clinic May 2023  Continue to have shortness of breath, worse in cool weather No regular exercise program Difficulty getting out and doing his activities secondary to chronic chest tightness, shortness of breath consistent with chronic stable angina Weight continues to run high  Prior work-up reviewed with him Stress test May 2023 No ischemia, normal ejection fraction  Echocardiogram June 2023 Normal left and right ventricular size and function No significant valve disease  Lab work reviewed Total cholesterol 169 LDL 105 A1c 7.3 Hemoglobin 13.9  EKG personally reviewed by myself on todays visit NSR rate 70 bpm    Lab Results  Component Value Date   CHOL  11/19/2009    114        ATP III CLASSIFICATION:  <200     mg/dL   Desirable  308-657  mg/dL   Borderline High  >=846    mg/dL   High          HDL 29 (L) 11/19/2009   LDLCALC  11/19/2009    70        Total Cholesterol/HDL:CHD Risk Coronary Heart Disease Risk Table                     Men    Women  1/2 Average Risk   3.4   3.3  Average Risk       5.0   4.4  2 X Average Risk   9.6   7.1  3 X Average Risk  23.4   11.0        Use the calculated Patient Ratio above and the CHD Risk Table to determine the patient's CHD Risk.        ATP III CLASSIFICATION (LDL):  <100     mg/dL   Optimal  962-952  mg/dL   Near or Above                    Optimal  130-159  mg/dL   Borderline  841-324  mg/dL   High  >401     mg/dL   Very High   TRIG 75 02/72/5366     Other past medical history  October 31 2015 having episode of near syncope, got up from a recliner, felt very lightheaded, recovered without syncope One episode approximately 10 days prior to that, getting up from a lawnmower to a upright position, had near-syncope syncope At that time , midodrine up to 10 mg, Florinef 0.1 mg every other day   Previous  orthostasis numbers/blood pressure numbers at home  Showed drops in blood pressure with standing sometimes 80 systolic  Previous episode of falling down or falling into people at a baseball game    He is on disability for his exercise intolerance Previously reported having lightheaded spells getting out of the car. Lasix dosing was decreased    PMH:   has a past medical history of Anxiety, Atherosclerosis of coronary artery bypass graft with angina pectoris ([place]), Barrett's esophagus, Benign enlargement of prostate, Chronic constipation, Colon polyp, Coronary artery disease, COVID-19, Depression, GERD (gastroesophageal reflux disease), H/O esophageal spasm, Hyperlipidemia, Hypertension, MI (myocardial infarction) ([place]) (2011 & 2014), Post-COVID chronic cough, Sleep apnea, TIA (transient ischemic attack), Type 2 diabetes mellitus ([place]), and Wears hearing aid in both ears.  PSH:    Past Surgical History:  Procedure Laterality Date   abscess cellulitis resection      ANAL FISSURE REPAIR     CARDIAC CATHETERIZATION  2011   CATARACT EXTRACTION W/PHACO Left 12/30/2021   Procedure:  CATARACT EXTRACTION PHACO AND INTRAOCULAR LENS PLACEMENT ([place]) LEFT DIABETIC;  Surgeon: Leandrew Koyanagi, MD;  Location: [place];  Service: Ophthalmology;  Laterality: Left;  Diabetic 2.75 00:34.3   CATARACT EXTRACTION W/PHACO Right 01/13/2022   Procedure: CATARACT EXTRACTION PHACO AND INTRAOCULAR LENS PLACEMENT ([place]) RIGHT DIABETIC;  Surgeon: Leandrew Koyanagi, MD;  Location: [place];  Service: Ophthalmology;  Laterality: Right;  Diabetic 5.34 00:56.7   COLONOSCOPY     COLONOSCOPY WITH PROPOFOL N/A 06/20/2019   Procedure: COLONOSCOPY WITH PROPOFOL;  Surgeon: Toledo, Benay Pike, MD;  Location: [facility];  Service: Gastroenterology;  Laterality: N/A;   CORONARY ANGIOPLASTY WITH STENT PLACEMENT  2011   stent placement x 3 @ [place]; Dr. Saralyn Pilar   CORONARY ARTERY BYPASS GRAFT  2014   CABG x 2   ESOPHAGOGASTRODUODENOSCOPY (EGD) WITH PROPOFOL N/A 06/20/2019   Procedure: ESOPHAGOGASTRODUODENOSCOPY (EGD) WITH PROPOFOL;  Surgeon: Toledo, Benay Pike, MD;  Location: [facility];  Service: Gastroenterology;  Laterality: N/A;   EXCISION MASS NECK  [id number]   faliculitis, incision became infected and was in hospital for 21 days   MOUTH SURGERY  2019   PARTIAL KNEE ARTHROPLASTY Right 07/19/2017   Procedure: UNICOMPARTMENTAL KNEE;  Surgeon: Corky Mull, MD;  Location: [facility];  Service: Orthopedics;  Laterality: Right;   TEE WITH CARDIOVERSION     TOTAL KNEE ARTHROPLASTY Right 10/23/2018   Procedure: CONVERSION OF PARTIAL TO TOTAL KNEE ARTHROPLASTY;  Surgeon: Corky Mull, MD;  Location: [facility];  Service: Orthopedics;  Laterality: Right;   TOTAL KNEE ARTHROPLASTY Left 01/10/2020   Procedure: TOTAL KNEE ARTHROPLASTY;  Surgeon: Corky Mull, MD;  Location: [facility];  Service: Orthopedics;  Laterality: Left;    Current Outpatient Medications  Medication Sig Dispense Refill   acetaminophen (TYLENOL) 325 MG tablet Take 650 mg by mouth every 6 (six) hours as needed for  moderate pain or headache.      albuterol (PROVENTIL HFA;VENTOLIN HFA) 108 (90 BASE) MCG/ACT inhaler Inhale 1-2 puffs into the lungs every 6 (six) hours as needed for wheezing or shortness of breath.      alfuzosin (UROXATRAL) 10 MG 24 hr tablet Take 10 mg by mouth daily.     Amphetamine-Dextroamphetamine (ADDERALL PO) Take by mouth as needed.     ASPIRIN 81 PO Take by mouth daily.     atorvastatin (LIPITOR) 40 MG tablet Take 40 mg by mouth daily.      cetirizine (ZYRTEC) 10 MG tablet Take 10  mg by mouth daily as needed for allergies.      diphenhydrAMINE (BENADRYL) 50 MG capsule Take 50 mg by mouth at bedtime as needed for sleep.     ezetimibe (ZETIA) 10 MG tablet TAKE 1 TABLET BY MOUTH EVERY DAY 90 tablet 2   fluticasone (FLONASE) 50 MCG/ACT nasal spray Place into the nose.     fluticasone-salmeterol (ADVAIR HFA) 115-21 MCG/ACT inhaler Inhale 2 puffs into the lungs 2 (two) times daily. 1 Inhaler 6   losartan (COZAAR) 50 MG tablet Take 50 mg by mouth daily.     metFORMIN (GLUCOPHAGE) 850 MG tablet Take 850 mg by mouth daily with breakfast.      metoprolol succinate (TOPROL-XL) 25 MG 24 hr tablet Take by mouth.     mirabegron ER (MYRBETRIQ) 25 MG TB24 tablet      nitroGLYCERIN (NITROSTAT) 0.4 MG SL tablet Place 1 tablet (0.4 mg total) under the tongue every 5 (five) minutes as needed for chest pain. 25 tablet 3   RABEprazole (ACIPHEX) 20 MG tablet Take 20 mg by mouth daily.      TRADJENTA 5 MG TABS tablet Take 5 mg by mouth daily.     traZODone (DESYREL) 100 MG tablet TAKE 1/2 TO 1 TABLET BY MOUTH NIGHTLY.     venlafaxine XR (EFFEXOR-XR) 75 MG 24 hr capsule Take 225 mg by mouth daily.     No current facility-administered medications for this visit.     Allergies:   Oxycodone, Hydrocodone, and Tramadol   Social History:  The patient  reports that he has never smoked. He has never used smokeless tobacco. He reports that he does not drink alcohol and does not use drugs.   Family History:    family history includes Hyperlipidemia in his father and mother; Hypertension in his father and mother.   Review of Systems  Constitutional:  Positive for malaise/fatigue.  HENT: Negative.    Respiratory:  Positive for shortness of breath.   Cardiovascular:  Positive for chest pain.  Gastrointestinal: Negative.   Musculoskeletal: Negative.   Neurological: Negative.   Psychiatric/Behavioral: Negative.    All other systems reviewed and are negative.   PHYSICAL EXAM: VS:  BP 126/72 (BP Location: Left Arm, Patient Position: Sitting, Cuff Size: Normal)   Pulse 70   Ht 5\' 10"  (1.778 m)   Wt 257 lb 4 oz (116.7 kg)   SpO2 97%   BMI 36.91 kg/m  , BMI Body mass index is 36.91 kg/m. Constitutional:  oriented to person, place, and time. No distress.  HENT:  Head: Grossly normal Eyes:  no discharge. No scleral icterus.  Neck: No JVD, no carotid bruits  Cardiovascular: Regular rate and rhythm, no murmurs appreciated Pulmonary/Chest: Clear to auscultation bilaterally, no wheezes or rails Abdominal: Soft.  no distension.  no tenderness.  Musculoskeletal: Normal range of motion Neurological:  normal muscle tone. Coordination normal. No atrophy Skin: Skin warm and dry Psychiatric: normal affect, pleasant  Recent Labs: No results found for requested labs within last 365 days.    Lipid Panel Reviewed through care everywhere  Wt Readings from Last 3 Encounters:  10/05/22 257 lb 4 oz (116.7 kg)  03/30/22 258 lb 4 oz (117.1 kg)  01/13/22 253 lb (114.8 kg)     ASSESSMENT AND PLAN:  Coronary artery disease with chronic stable angina Continues to have fatigue shortness of breath, giving out on exertion, chest tightness Likely chronic stable angina Prior echocardiogram stress test performed several months ago unrevealing, no significant  ischemia, normal ejection fraction We have recommended cardiac rehab  Hyperlipidemia, unspecified hyperlipidemia type  He prefers to continue Zetia  with Lipitor Feels that he may be able to get numbers down with cardiac rehab Discussed alternatives such as Repatha, changing Lipitor to Crestor  Orthostatic hypotension - Plan: EKG 12-Lead  not on midodrine, blood pressure stable  Shortness of breath -  Exacerbated by deconditioning, morbid obesity, chronic stable angina He has completed echo and stress testing which was unrevealing Order placed for cardiac rehab  S/P CABG x 2 - Plan: EKG 12-Lead Denies anginal symptoms, no further testing   Total encounter time more than 30 minutes  Greater than 50% was spent in counseling and coordination of care with the patient   Orders Placed This Encounter  Procedures   AMB referral to cardiac rehabilitation   EKG 12-Lead     Signed, Esmond Plants, M.D., Ph.D. 10/05/2022  [place], [place]

## 2022-10-05 ENCOUNTER — Encounter: Payer: Self-pay | Admitting: Cardiovascular Disease

## 2022-10-05 ENCOUNTER — Ambulatory Visit: Payer: PPO | Attending: Cardiovascular Disease | Admitting: Cardiovascular Disease

## 2022-10-05 VITALS — BP 126/72 | HR 70 | Ht 70.0 in | Wt 257.2 lb

## 2022-10-05 DIAGNOSIS — I951 Orthostatic hypotension: Secondary | ICD-10-CM | POA: Diagnosis not present

## 2022-10-05 DIAGNOSIS — R0789 Other chest pain: Secondary | ICD-10-CM

## 2022-10-05 DIAGNOSIS — I2089 Other forms of angina pectoris: Secondary | ICD-10-CM

## 2022-10-05 DIAGNOSIS — I25118 Atherosclerotic heart disease of native coronary artery with other forms of angina pectoris: Secondary | ICD-10-CM | POA: Diagnosis not present

## 2022-10-05 DIAGNOSIS — E782 Mixed hyperlipidemia: Secondary | ICD-10-CM | POA: Diagnosis not present

## 2022-10-05 DIAGNOSIS — E1159 Type 2 diabetes mellitus with other circulatory complications: Secondary | ICD-10-CM

## 2022-10-05 NOTE — Patient Instructions (Addendum)
Medication Instructions:  No changes  If you need a refill on your cardiac medications before your next appointment, please call your pharmacy.    Lab work: No new labs needed   Testing/Procedures: No new testing needed   Follow-Up: At [facility], you and your health needs are our priority.  As part of our continuing mission to provide you with exceptional heart care, we have created designated Provider Care Teams.  These Care Teams include your primary Cardiologist (physician) and Advanced Practice Providers (APPs -  Physician Assistants and Nurse Practitioners) who all work together to provide you with the care you need, when you need it.  You will need a follow up appointment in 6 months  Providers on your designated Care Team:   Christopher Berge, NP Ryan Dunn, PA-C Cadence Furth, PA-C  COVID-19 Vaccine Information can be found at: [email or web address] For questions related to vaccine distribution or appointments, please email [email or web address] or call [phone].   

## 2022-10-07 DIAGNOSIS — E119 Type 2 diabetes mellitus without complications: Secondary | ICD-10-CM | POA: Diagnosis not present

## 2022-10-12 ENCOUNTER — Encounter: Payer: PPO | Attending: Cardiovascular Disease | Admitting: *Deleted

## 2022-10-12 DIAGNOSIS — I2089 Other forms of angina pectoris: Secondary | ICD-10-CM | POA: Insufficient documentation

## 2022-10-12 NOTE — Progress Notes (Signed)
Initial phone call completed. Diagnosis can be found in [facility] 11/14. EP Orientation scheduled for Wednesday 11/29 at 10:30.

## 2022-10-20 ENCOUNTER — Encounter: Payer: PPO | Admitting: *Deleted

## 2022-10-20 VITALS — Ht 70.0 in | Wt 252.6 lb

## 2022-10-20 DIAGNOSIS — I2089 Other forms of angina pectoris: Secondary | ICD-10-CM

## 2022-10-20 NOTE — Patient Instructions (Signed)
Patient Instructions  Patient Details  Name: [name] MRN: [id number] Date of Birth: [birth year] Referring Provider:  Antonieta Iba, MD  Below are your personal goals for exercise, nutrition, and risk factors. Our goal is to help you stay on track towards obtaining and maintaining these goals. We will be discussing your progress on these goals with you throughout the program.  Initial Exercise Prescription:  Initial Exercise Prescription - 10/20/22 1200       Date of Initial Exercise RX and Referring Provider   Date 10/20/22    Referring Provider Gollan      Oxygen   Maintain Oxygen Saturation 88% or higher      Treadmill   MPH 2.3    Grade 1    Minutes 15    METs 3.08      Arm Ergometer   Level 1    RPM 30    Minutes 15    METs 3.17      REL-XR   Level 2    Speed 50    Minutes 15    METs 3.17      Track   Laps 30    Minutes 15    METs 2.63      Prescription Details   Frequency (times per week) 3    Duration Progress to 30 minutes of continuous aerobic without signs/symptoms of physical distress      Intensity   THRR 40-80% of Max Heartrate 105-136    Ratings of Perceived Exertion 11-13    Perceived Dyspnea 0-4      Progression   Progression Continue to progress workloads to maintain intensity without signs/symptoms of physical distress.      Resistance Training   Training Prescription Yes    Weight 5    Reps 10-15             Exercise Goals: Frequency: Be able to perform aerobic exercise two to three times per week in program working toward 2-5 days per week of home exercise.  Intensity: Work with a perceived exertion of 11 (fairly light) - 15 (hard) while following your exercise prescription.  We will make changes to your prescription with you as you progress through the program.   Duration: Be able to do 30 to 45 minutes of continuous aerobic exercise in addition to a 5 minute warm-up and a 5 minute cool-down routine.   Nutrition  Goals: Your personal nutrition goals will be established when you do your nutrition analysis with the dietician.  The following are general nutrition guidelines to follow: Cholesterol < 200mg /day Sodium < 1500mg /day Fiber: Men over 50 yrs - 30 grams per day  Personal Goals:  Personal Goals and Risk Factors at Admission - 10/20/22 1241       Core Components/Risk Factors/Patient Goals on Admission    Weight Management Yes    Intervention Weight Management: Develop a combined nutrition and exercise program designed to reach desired caloric intake, while maintaining appropriate intake of nutrient and fiber, sodium and fats, and appropriate energy expenditure required for the weight goal.;Weight Management: Provide education and appropriate resources to help participant work on and attain dietary goals.;Weight Management/Obesity: Establish reasonable short term and long term weight goals.    Admit Weight 252 lb 9.6 oz (114.6 kg)    Goal Weight: Short Term 245 lb (111.1 kg)    Goal Weight: Long Term 220 lb (99.8 kg)    Expected Outcomes Short Term: Continue to assess and modify interventions until  short term weight is achieved;Long Term: Adherence to nutrition and physical activity/exercise program aimed toward attainment of established weight goal;Weight Loss: Understanding of general recommendations for a balanced deficit meal plan, which promotes 1-2 lb weight loss per week and includes a negative energy balance of [phone] kcal/d;Understanding recommendations for meals to include 15-35% energy as protein, 25-35% energy from fat, 35-60% energy from carbohydrates, less than 200mg  of dietary cholesterol, 20-35 gm of total fiber daily;Understanding of distribution of calorie intake throughout the day with the consumption of 4-5 meals/snacks    Improve shortness of breath with ADL's Yes    Intervention Provide education, individualized exercise plan and daily activity instruction to help decrease  symptoms of SOB with activities of daily living.    Expected Outcomes Short Term: Improve cardiorespiratory fitness to achieve a reduction of symptoms when performing ADLs;Long Term: Be able to perform more ADLs without symptoms or delay the onset of symptoms    Increase knowledge of respiratory medications and ability to use respiratory devices properly  Yes    Intervention Provide education and demonstration as needed of appropriate use of medications, inhalers, and oxygen therapy.    Expected Outcomes Short Term: Achieves understanding of medications use. Understands that oxygen is a medication prescribed by physician. Demonstrates appropriate use of inhaler and oxygen therapy.;Long Term: Maintain appropriate use of medications, inhalers, and oxygen therapy.    Diabetes Yes    Intervention Provide education about signs/symptoms and action to take for hypo/hyperglycemia.;Provide education about proper nutrition, including hydration, and aerobic/resistive exercise prescription along with prescribed medications to achieve blood glucose in normal ranges: Fasting glucose 65-99 mg/dL    Expected Outcomes Short Term: Participant verbalizes understanding of the signs/symptoms and immediate care of hyper/hypoglycemia, proper foot care and importance of medication, aerobic/resistive exercise and nutrition plan for blood glucose control.;Long Term: Attainment of HbA1C < 7%.    Hypertension Yes    Intervention Provide education on lifestyle modifcations including regular physical activity/exercise, weight management, moderate sodium restriction and increased consumption of fresh fruit, vegetables, and low fat dairy, alcohol moderation, and smoking cessation.;Monitor prescription use compliance.    Expected Outcomes Short Term: Continued assessment and intervention until BP is < 140/37mm HG in hypertensive participants. < 130/62mm HG in hypertensive participants with diabetes, heart failure or chronic kidney  disease.;Long Term: Maintenance of blood pressure at goal levels.    Lipids Yes    Intervention Provide education and support for participant on nutrition & aerobic/resistive exercise along with prescribed medications to achieve LDL 70mg , HDL >40mg .    Expected Outcomes Short Term: Participant states understanding of desired cholesterol values and is compliant with medications prescribed. Participant is following exercise prescription and nutrition guidelines.;Long Term: Cholesterol controlled with medications as prescribed, with individualized exercise RX and with personalized nutrition plan. Value goals: LDL < 70mg , HDL > 40 mg.             Tobacco Use Initial Evaluation: Social History   Tobacco Use  Smoking Status Never  Smokeless Tobacco Never    Exercise Goals and Review:  Exercise Goals     Row Name 10/20/22 1238             Exercise Goals   Increase Physical Activity Yes       Intervention Provide advice, education, support and counseling about physical activity/exercise needs.;Develop an individualized exercise prescription for aerobic and resistive training based on initial evaluation findings, risk stratification, comorbidities and participant's personal goals.       Expected  Outcomes Short Term: Attend rehab on a regular basis to increase amount of physical activity.;Long Term: Add in home exercise to make exercise part of routine and to increase amount of physical activity.;Long Term: Exercising regularly at least 3-5 days a week.       Increase Strength and Stamina Yes       Intervention Provide advice, education, support and counseling about physical activity/exercise needs.;Develop an individualized exercise prescription for aerobic and resistive training based on initial evaluation findings, risk stratification, comorbidities and participant's personal goals.       Expected Outcomes Short Term: Increase workloads from initial exercise prescription for resistance,  speed, and METs.;Short Term: Perform resistance training exercises routinely during rehab and add in resistance training at home;Long Term: Improve cardiorespiratory fitness, muscular endurance and strength as measured by increased METs and functional capacity ( )       Able to understand and use rate of perceived exertion (RPE) scale Yes       Intervention Provide education and explanation on how to use RPE scale       Expected Outcomes Short Term: Able to use RPE daily in rehab to express subjective intensity level;Long Term:  Able to use RPE to guide intensity level when exercising independently       Able to understand and use Dyspnea scale Yes       Intervention Provide education and explanation on how to use Dyspnea scale       Expected Outcomes Short Term: Able to use Dyspnea scale daily in rehab to express subjective sense of shortness of breath during exertion;Long Term: Able to use Dyspnea scale to guide intensity level when exercising independently       Knowledge and understanding of Target Heart Rate Range (THRR) Yes       Intervention Provide education and explanation of THRR including how the numbers were predicted and where they are located for reference       Expected Outcomes Short Term: Able to state/look up THRR;Long Term: Able to use THRR to govern intensity when exercising independently;Short Term: Able to use daily as guideline for intensity in rehab       Able to check pulse independently Yes       Intervention Provide education and demonstration on how to check pulse in carotid and radial arteries.;Review the importance of being able to check your own pulse for safety during independent exercise       Expected Outcomes Short Term: Able to explain why pulse checking is important during independent exercise       Understanding of Exercise Prescription Yes       Intervention Provide education, explanation, and written materials on patient's individual exercise prescription        Expected Outcomes Short Term: Able to explain program exercise prescription;Long Term: Able to explain home exercise prescription to exercise independently                Copy of goals given to participant.

## 2022-10-20 NOTE — Progress Notes (Signed)
Cardiac Individual Treatment Plan  Patient Details  Name: [name] MRN: [id number] Date of Birth: [birth year] Referring Provider:   Flowsheet Row Cardiac Rehab from 10/20/2022 in [facility] Cardiac and Pulmonary Rehab  Referring Provider Gollan       Initial Encounter Date:  Flowsheet Row Cardiac Rehab from 10/20/2022 in [facility] Cardiac and Pulmonary Rehab  Date 10/20/22       Visit Diagnosis: Chronic stable angina  Patient's Home Medications on Admission:  Current Outpatient Medications:    acetaminophen (TYLENOL) 325 MG tablet, Take 650 mg by mouth every 6 (six) hours as needed for moderate pain or headache. , Disp: , Rfl:    albuterol (PROVENTIL HFA;VENTOLIN HFA) 108 (90 BASE) MCG/ACT inhaler, Inhale 1-2 puffs into the lungs every 6 (six) hours as needed for wheezing or shortness of breath. , Disp: , Rfl:    alfuzosin (UROXATRAL) 10 MG 24 hr tablet, Take 10 mg by mouth daily., Disp: , Rfl:    Amphetamine-Dextroamphetamine (ADDERALL PO), Take by mouth as needed., Disp: , Rfl:    ASPIRIN 81 PO, Take by mouth daily., Disp: , Rfl:    atorvastatin (LIPITOR) 40 MG tablet, Take 40 mg by mouth daily. , Disp: , Rfl:    cetirizine (ZYRTEC) 10 MG tablet, Take 10 mg by mouth daily as needed for allergies. , Disp: , Rfl:    diphenhydrAMINE (BENADRYL) 50 MG capsule, Take 50 mg by mouth at bedtime as needed for sleep., Disp: , Rfl:    ezetimibe (ZETIA) 10 MG tablet, TAKE 1 TABLET BY MOUTH EVERY DAY, Disp: 90 tablet, Rfl: 2   fluticasone (FLONASE) 50 MCG/ACT nasal spray, Place into the nose., Disp: , Rfl:    fluticasone-salmeterol (ADVAIR HFA) 115-21 MCG/ACT inhaler, Inhale 2 puffs into the lungs 2 (two) times daily., Disp: 1 Inhaler, Rfl: 6   losartan (COZAAR) 50 MG tablet, Take 50 mg by mouth daily., Disp: , Rfl:    metFORMIN (GLUCOPHAGE) 850 MG tablet, Take 850 mg by mouth daily with breakfast. , Disp: , Rfl:    metoprolol succinate (TOPROL-XL) 25 MG 24 hr tablet, Take by mouth., Disp: , Rfl:     mirabegron ER (MYRBETRIQ) 25 MG TB24 tablet, , Disp: , Rfl:    nitroGLYCERIN (NITROSTAT) 0.4 MG SL tablet, Place 1 tablet (0.4 mg total) under the tongue every 5 (five) minutes as needed for chest pain., Disp: 25 tablet, Rfl: 3   RABEprazole (ACIPHEX) 20 MG tablet, Take 20 mg by mouth daily. , Disp: , Rfl:    TRADJENTA 5 MG TABS tablet, Take 5 mg by mouth daily., Disp: , Rfl:    traZODone (DESYREL) 100 MG tablet, TAKE 1/2 TO 1 TABLET BY MOUTH NIGHTLY., Disp: , Rfl:    venlafaxine XR (EFFEXOR-XR) 75 MG 24 hr capsule, Take 225 mg by mouth daily., Disp: , Rfl:   Past Medical History: Past Medical History:  Diagnosis Date   Anxiety    Atherosclerosis of coronary artery bypass graft with angina pectoris (HCC)    Barrett's esophagus    Benign enlargement of prostate    Chronic constipation    Colon polyp    Coronary artery disease    COVID-19    1/22 3/22   Depression    GERD (gastroesophageal reflux disease)    H/O esophageal spasm    Hyperlipidemia    Hypertension    MI (myocardial infarction) ([place]) 2011 & 2014   x 2   Post-COVID chronic cough    Sleep apnea  after weight loss no need for CPAP   TIA (transient ischemic attack)    "many yrs ago" - no deficits   Type 2 diabetes mellitus (HCC)    Wears hearing aid in both ears     Tobacco Use: Social History   Tobacco Use  Smoking Status Never  Smokeless Tobacco Never    Labs: Review Flowsheet       Latest Ref Rng & Units 04/22/2009 11/18/2009 11/19/2009 11/02/2018  Labs for ITP Cardiac and Pulmonary Rehab  Cholestrol 0 - 200 mg/dL - - 371        ATP III CLASSIFICATION:  <200     mg/dL   Desirable  062-694  mg/dL   Borderline High  >=854    mg/dL   High         -  LDL (calc) 0 - 99 mg/dL - - 70        Total Cholesterol/HDL:CHD Risk Coronary Heart Disease Risk Table                     Men   Women  1/2 Average Risk   3.4   3.3  Average Risk       5.0   4.4  2 X Average Risk   9.6   7.1  3 X Average Risk   23.4   11.0        Use the calculated Patient Ratio above and the CHD Risk Table to determine the patient's CHD Risk.        ATP III CLASSIFICATION (LDL):  <100     mg/dL   Optimal  627-035  mg/dL   Near or Above                    Optimal  130-159  mg/dL   Borderline  009-381  mg/dL   High  >829     mg/dL   Very High  -  HDL-C >93 mg/dL - - 29  -  Trlycerides <150 mg/dL - - 75  -  Hemoglobin Z1I 4.8 - 5.6 % - 5.7 (NOTE) The ADA recommends the following therapeutic goal for glycemic control related to Hgb A1c measurement: Goal of therapy: <6.5 Hgb A1c  Reference: American Diabetes Association: Clinical Practice Recommendations 2010, Diabetes Care, 2010, 33: (Suppl  1).  - 4.8   TCO2 0 - 100 mmol/L 21  - - -     Exercise Target Goals: Exercise Program Goal: Individual exercise prescription set using results from initial 6 min walk test and THRR while considering  patient's activity barriers and safety.   Exercise Prescription Goal: Initial exercise prescription builds to 30-45 minutes a day of aerobic activity, 2-3 days per week.  Home exercise guidelines will be given to patient during program as part of exercise prescription that the participant will acknowledge.   Education: Aerobic Exercise: - Group verbal and visual presentation on the components of exercise prescription. Introduces F.I.T.T principle from ACSM for exercise prescriptions.  Reviews F.I.T.T. principles of aerobic exercise including progression. Written material given at graduation. Flowsheet Row Cardiac Rehab from 10/20/2022 in [facility] Cardiac and Pulmonary Rehab  Education need identified 10/20/22       Education: Resistance Exercise: - Group verbal and visual presentation on the components of exercise prescription. Introduces F.I.T.T principle from ACSM for exercise prescriptions  Reviews F.I.T.T. principles of resistance exercise including progression. Written material given at graduation.    Education:  Exercise & Equipment Safety: -  Individual verbal instruction and demonstration of equipment use and safety with use of the equipment. Flowsheet Row Cardiac Rehab from 10/20/2022 in [facility] Cardiac and Pulmonary Rehab  Date 10/20/22  Educator [facility]  Instruction Review Code 1- Verbalizes Understanding       Education: Exercise Physiology & General Exercise Guidelines: - Group verbal and written instruction with models to review the exercise physiology of the cardiovascular system and associated critical values. Provides general exercise guidelines with specific guidelines to those with heart or lung disease.    Education: Flexibility, Balance, Mind/Body Relaxation: - Group verbal and visual presentation with interactive activity on the components of exercise prescription. Introduces F.I.T.T principle from ACSM for exercise prescriptions. Reviews F.I.T.T. principles of flexibility and balance exercise training including progression. Also discusses the mind body connection.  Reviews various relaxation techniques to help reduce and manage stress (i.e. Deep breathing, progressive muscle relaxation, and visualization). Balance handout provided to take home. Written material given at graduation. Flowsheet Row Pulmonary Rehab from 06/02/2015 in [facility]  Date 05/12/15  Educator L. Brown,RT  Instruction Review Code (retired) 2- meets goals/outcomes       Activity Barriers & Risk Stratification:  Activity Barriers & Cardiac Risk Stratification - 10/20/22 1232       Activity Barriers & Cardiac Risk Stratification   Activity Barriers Left Knee Replacement;Right Knee Replacement    Cardiac Risk Stratification High             6 Minute Walk:  6 Minute Walk     Row Name 10/20/22 1229         6 Minute Walk   Phase Initial     Distance 1430 feet     Walk Time 6 minutes     # of Rest Breaks 0     MPH 2.71     METS 3.17     RPE 17     Perceived  Dyspnea  3     VO2 Peak 11.11     Symptoms No     Resting HR 74 bpm     Resting BP 122/80     Resting Oxygen Saturation  99 %     Exercise Oxygen Saturation  during 6 min walk 99 %     Max Ex. HR 118 bpm     Max Ex. BP 160/72     2 Minute Post BP 128/82              Oxygen Initial Assessment:   Oxygen Re-Evaluation:   Oxygen Discharge (Final Oxygen Re-Evaluation):   Initial Exercise Prescription:  Initial Exercise Prescription - 10/20/22 1200       Date of Initial Exercise RX and Referring Provider   Date 10/20/22    Referring Provider Gollan      Oxygen   Maintain Oxygen Saturation 88% or higher      Treadmill   MPH 2.3    Grade 1    Minutes 15    METs 3.08      Arm Ergometer   Level 1    RPM 30    Minutes 15    METs 3.17      REL-XR   Level 2    Speed 50    Minutes 15    METs 3.17      Track   Laps 30    Minutes 15    METs 2.63      Prescription Details   Frequency (times per week)  3    Duration Progress to 30 minutes of continuous aerobic without signs/symptoms of physical distress      Intensity   THRR 40-80% of Max Heartrate 105-136    Ratings of Perceived Exertion 11-13    Perceived Dyspnea 0-4      Progression   Progression Continue to progress workloads to maintain intensity without signs/symptoms of physical distress.      Resistance Training   Training Prescription Yes    Weight 5    Reps 10-15             Perform Capillary Blood Glucose checks as needed.  Exercise Prescription Changes:   Exercise Prescription Changes     Row Name 10/20/22 1200             Response to Exercise   Blood Pressure (Admit) 122/80       Blood Pressure (Exercise) 160/72       Blood Pressure (Exit) 120/70       Heart Rate (Admit) 74 bpm       Heart Rate (Exercise) 118 bpm       Heart Rate (Exit) 91 bpm       Oxygen Saturation (Exercise) 99 %       Oxygen Saturation (Exit) 99 %       Rating of Perceived Exertion (Exercise) 99        Perceived Dyspnea (Exercise) 3       Symptoms none       Comments results                Exercise Comments:   Exercise Goals and Review:   Exercise Goals     Row Name 10/20/22 1238             Exercise Goals   Increase Physical Activity Yes       Intervention Provide advice, education, support and counseling about physical activity/exercise needs.;Develop an individualized exercise prescription for aerobic and resistive training based on initial evaluation findings, risk stratification, comorbidities and participant's personal goals.       Expected Outcomes Short Term: Attend rehab on a regular basis to increase amount of physical activity.;Long Term: Add in home exercise to make exercise part of routine and to increase amount of physical activity.;Long Term: Exercising regularly at least 3-5 days a week.       Increase Strength and Stamina Yes       Intervention Provide advice, education, support and counseling about physical activity/exercise needs.;Develop an individualized exercise prescription for aerobic and resistive training based on initial evaluation findings, risk stratification, comorbidities and participant's personal goals.       Expected Outcomes Short Term: Increase workloads from initial exercise prescription for resistance, speed, and METs.;Short Term: Perform resistance training exercises routinely during rehab and add in resistance training at home;Long Term: Improve cardiorespiratory fitness, muscular endurance and strength as measured by increased METs and functional capacity ( )       Able to understand and use rate of perceived exertion (RPE) scale Yes       Intervention Provide education and explanation on how to use RPE scale       Expected Outcomes Short Term: Able to use RPE daily in rehab to express subjective intensity level;Long Term:  Able to use RPE to guide intensity level when exercising independently       Able to understand and use  Dyspnea scale Yes       Intervention Provide education and explanation  on how to use Dyspnea scale       Expected Outcomes Short Term: Able to use Dyspnea scale daily in rehab to express subjective sense of shortness of breath during exertion;Long Term: Able to use Dyspnea scale to guide intensity level when exercising independently       Knowledge and understanding of Target Heart Rate Range (THRR) Yes       Intervention Provide education and explanation of THRR including how the numbers were predicted and where they are located for reference       Expected Outcomes Short Term: Able to state/look up THRR;Long Term: Able to use THRR to govern intensity when exercising independently;Short Term: Able to use daily as guideline for intensity in rehab       Able to check pulse independently Yes       Intervention Provide education and demonstration on how to check pulse in carotid and radial arteries.;Review the importance of being able to check your own pulse for safety during independent exercise       Expected Outcomes Short Term: Able to explain why pulse checking is important during independent exercise       Understanding of Exercise Prescription Yes       Intervention Provide education, explanation, and written materials on patient's individual exercise prescription       Expected Outcomes Short Term: Able to explain program exercise prescription;Long Term: Able to explain home exercise prescription to exercise independently                Exercise Goals Re-Evaluation :   Discharge Exercise Prescription (Final Exercise Prescription Changes):  Exercise Prescription Changes - 10/20/22 1200       Response to Exercise   Blood Pressure (Admit) 122/80    Blood Pressure (Exercise) 160/72    Blood Pressure (Exit) 120/70    Heart Rate (Admit) 74 bpm    Heart Rate (Exercise) 118 bpm    Heart Rate (Exit) 91 bpm    Oxygen Saturation (Exercise) 99 %    Oxygen Saturation (Exit) 99 %    Rating  of Perceived Exertion (Exercise) 99    Perceived Dyspnea (Exercise) 3    Symptoms none    Comments results             Nutrition:  Target Goals: Understanding of nutrition guidelines, daily intake of sodium 1500mg , cholesterol 200mg , calories 30% from fat and 7% or less from saturated fats, daily to have 5 or more servings of fruits and vegetables.  Education: All About Nutrition: -Group instruction provided by verbal, written material, interactive activities, discussions, models, and posters to present general guidelines for heart healthy nutrition including fat, fiber, MyPlate, the role of sodium in heart healthy nutrition, utilization of the nutrition label, and utilization of this knowledge for meal planning. Follow up email sent as well. Written material given at graduation. Flowsheet Row Cardiac Rehab from 10/20/2022 in [facility] Cardiac and Pulmonary Rehab  Education need identified 10/20/22       Biometrics:  Pre Biometrics - 10/20/22 1239       Pre Biometrics   Height 5\' 10"  (1.778 m)    Weight 252 lb 9.6 oz (114.6 kg)    Waist Circumference 50 inches    Hip Circumference 51.5 inches    Waist to Hip Ratio 0.97 %    BMI (Calculated) 36.24    Single Leg Stand 4.06 seconds              Nutrition Therapy  Plan and Nutrition Goals:  Nutrition Therapy & Goals - 10/20/22 1240       Intervention Plan   Intervention Prescribe, educate and counsel regarding individualized specific dietary modifications aiming towards targeted core components such as weight, hypertension, lipid management, diabetes, heart failure and other comorbidities.    Expected Outcomes Short Term Goal: Understand basic principles of dietary content, such as calories, fat, sodium, cholesterol and nutrients.;Short Term Goal: A plan has been developed with personal nutrition goals set during dietitian appointment.;Long Term Goal: Adherence to prescribed nutrition plan.             Nutrition  Assessments:  MEDIFICTS Score Key: ?70 Need to make dietary changes  40-70 Heart Healthy Diet ? 40 Therapeutic Level Cholesterol Diet  Flowsheet Row Cardiac Rehab from 10/20/2022 in [facility] Cardiac and Pulmonary Rehab  Picture Your Plate Total Score on Admission 44      Picture Your Plate Scores: <16 Unhealthy dietary pattern with much room for improvement. 41-50 Dietary pattern unlikely to meet recommendations for good health and room for improvement. 51-60 More healthful dietary pattern, with some room for improvement.  >60 Healthy dietary pattern, although there may be some specific behaviors that could be improved.    Nutrition Goals Re-Evaluation:   Nutrition Goals Discharge (Final Nutrition Goals Re-Evaluation):   Psychosocial: Target Goals: Acknowledge presence or absence of significant depression and/or stress, maximize coping skills, provide positive support system. Participant is able to verbalize types and ability to use techniques and skills needed for reducing stress and depression.   Education: Stress, Anxiety, and Depression - Group verbal and visual presentation to define topics covered.  Reviews how body is impacted by stress, anxiety, and depression.  Also discusses healthy ways to reduce stress and to treat/manage anxiety and depression.  Written material given at graduation. Flowsheet Row Pulmonary Rehab from 06/02/2015 in [facility]  Date 05/07/15  Educator [facility]  Instruction Review Code (retired) 2- meets goals/outcomes       Education: Sleep Hygiene -Provides group verbal and written instruction about how sleep can affect your health.  Define sleep hygiene, discuss sleep cycles and impact of sleep habits. Review good sleep hygiene tips.    Initial Review & Psychosocial Screening:  Initial Psych Review & Screening - 10/12/22 1309       Initial Review   Current issues with Current Depression;Current  Anxiety/Panic;Current Stress Concerns    Source of Stress Concerns Family      Family Dynamics   Good Support System? Yes   wife, sisters   Comments mother has dementia (84years old)      Barriers   Psychosocial barriers to participate in program There are no identifiable barriers or psychosocial needs.;The patient should benefit from training in stress management and relaxation.      Screening Interventions   Interventions Encouraged to exercise;Provide feedback about the scores to participant;To provide support and resources with identified psychosocial needs    Expected Outcomes Short Term goal: Utilizing psychosocial counselor, staff and physician to assist with identification of specific Stressors or current issues interfering with healing process. Setting desired goal for each stressor or current issue identified.;Long Term Goal: Stressors or current issues are controlled or eliminated.;Short Term goal: Identification and review with participant of any Quality of Life or Depression concerns found by scoring the questionnaire.;Long Term goal: The participant improves quality of Life and PHQ9 Scores as seen by post scores and/or verbalization of changes  Quality of Life Scores:   Quality of Life - 10/20/22 1240       Quality of Life   Select Quality of Life      Quality of Life Scores   Health/Function Pre 13.33 %    Socioeconomic Pre 18.38 %    Psych/Spiritual Pre 20.29 %    Family Pre 16.1 %    GLOBAL Pre 16.27 %            Scores of 19 and below usually indicate a poorer quality of life in these areas.  A difference of  2-3 points is a clinically meaningful difference.  A difference of 2-3 points in the total score of the Quality of Life Index has been associated with significant improvement in overall quality of life, self-image, physical symptoms, and general health in studies assessing change in quality of life.  PHQ-9: Review Flowsheet        10/20/2022  Depression screen PHQ 2/9  Decreased Interest 1  Down, Depressed, Hopeless 1  PHQ - 2 Score 2  Altered sleeping 1  Tired, decreased energy 2  Change in appetite 0  Feeling bad or failure about yourself  2  Trouble concentrating 0  Moving slowly or fidgety/restless 0  Suicidal thoughts 0  PHQ-9 Score 7  Difficult doing work/chores Somewhat difficult   Interpretation of Total Score  Total Score Depression Severity:  1-4 = Minimal depression, 5-9 = Mild depression, 10-14 = Moderate depression, 15-19 = Moderately severe depression, 20-27 = Severe depression   Psychosocial Evaluation and Intervention:  Psychosocial Evaluation - 10/12/22 1325       Psychosocial Evaluation & Interventions   Interventions Encouraged to exercise with the program and follow exercise prescription;Stress management education;Relaxation education    Comments [place] is coming to cardiac rehab with the diagnosis of stabel angina. He has done the program before after a stent and CABG. He has noticed his stamina is not like it used to be. He has been taking his medications as he should and is aware he needs to increase his exercise to help improve his breathing. When asked about depression, anxiety, and stress he reported he has been experiencing all three. He is in communication with his doctor regarding these concerns. His main source of stress is caring for his 60 year old mother who has dementia. She lives 2 miles up the road so he is close by. His sisters also help out when they can. He wants to come to the program to boost his stamina and get back on track with exercise.    Expected Outcomes Short: attend cardiac rehab for education and exercise. Long: develop and maintain positive self care habits.    Continue Psychosocial Services  Follow up required by staff             Psychosocial Re-Evaluation:   Psychosocial Discharge (Final Psychosocial Re-Evaluation):   Vocational  Rehabilitation: Provide vocational rehab assistance to qualifying candidates.   Vocational Rehab Evaluation & Intervention:  Vocational Rehab - 10/12/22 1309       Initial Vocational Rehab Evaluation & Intervention   Assessment shows need for Vocational Rehabilitation No             Education: Education Goals: Education classes will be provided on a variety of topics geared toward better understanding of heart health and risk factor modification. Participant will state understanding/return demonstration of topics presented as noted by education test scores.  Learning Barriers/Preferences:  Learning Barriers/Preferences - 10/12/22 1308  Learning Barriers/Preferences   Learning Barriers Hearing    Learning Preferences Individual Instruction             General Cardiac Education Topics:  AED/CPR: - Group verbal and written instruction with the use of models to demonstrate the basic use of the AED with the basic ABC's of resuscitation. Flowsheet Row Pulmonary Rehab from 06/02/2015 in [facility]  Date 04/18/15  Educator [employer]  Instruction Review Code (retired) 2- Market researchermeets goals/outcomes       Anatomy and Cardiac Procedures: - Group verbal and visual presentation and models provide information about basic cardiac anatomy and function. Reviews the testing methods done to diagnose heart disease and the outcomes of the test results. Describes the treatment choices: Medical Management, Angioplasty, or Coronary Bypass Surgery for treating various heart conditions including Myocardial Infarction, Angina, Valve Disease, and Cardiac Arrhythmias.  Written material given at graduation. Flowsheet Row Cardiac Rehab from 10/20/2022 in [facility] Cardiac and Pulmonary Rehab  Education need identified 10/20/22       Medication Safety: - Group verbal and visual instruction to review commonly prescribed medications for heart and lung disease.  Reviews the medication, class of the drug, and side effects. Includes the steps to properly store meds and maintain the prescription regimen.  Written material given at graduation.   Intimacy: - Group verbal instruction through game format to discuss how heart and lung disease can affect sexual intimacy. Written material given at graduation..   Know Your Numbers and Heart Failure: - Group verbal and visual instruction to discuss disease risk factors for cardiac and pulmonary disease and treatment options.  Reviews associated critical values for Overweight/Obesity, Hypertension, Cholesterol, and Diabetes.  Discusses basics of heart failure: signs/symptoms and treatments.  Introduces Heart Failure Zone chart for action plan for heart failure.  Written material given at graduation.   Infection Prevention: - Provides verbal and written material to individual with discussion of infection control including proper hand washing and proper equipment cleaning during exercise session. Flowsheet Row [facility]  Date 10/20/22  [facility]  Instruction Review Code 1- Verbalizes Understanding       Falls Prevention: - Provides verbal and written material to individual with discussion of falls prevention and safety. Flowsheet [facility]  Date 10/20/22  [facility]  Instruction Review Code 1- Verbalizes Understanding       Other: -Provides group and verbal instruction on various topics (see comments)   Knowledge Questionnaire Score:  Knowledge Questionnaire Score - 10/20/22 1241       Knowledge Questionnaire Score   Pre Score 23/26             Core Components/Risk Factors/Patient Goals at Admission:  Personal Goals and Risk Factors at Admission - 10/20/22 1241       Core Components/Risk Factors/Patient Goals on Admission    Weight Management Yes    Intervention Weight  Management: Develop a combined nutrition and exercise program designed to reach desired caloric intake, while maintaining appropriate intake of nutrient and fiber, sodium and fats, and appropriate energy expenditure required for the weight goal.;Weight Management: Provide education and appropriate resources to help participant work on and attain dietary goals.;Weight Management/Obesity: Establish reasonable short term and long term weight goals.    Admit Weight 252 lb 9.6 oz (114.6 kg)    Goal Weight: Short Term 245 lb (111.1 kg)    Goal  Weight: Long Term 220 lb (99.8 kg)    Expected Outcomes Short Term: Continue to assess and modify interventions until short term weight is achieved;Long Term: Adherence to nutrition and physical activity/exercise program aimed toward attainment of established weight goal;Weight Loss: Understanding of general recommendations for a balanced deficit meal plan, which promotes 1-2 lb weight loss per week and includes a negative energy balance of [phone] kcal/d;Understanding recommendations for meals to include 15-35% energy as protein, 25-35% energy from fat, 35-60% energy from carbohydrates, less than  of dietary cholesterol, 20-35 gm of total fiber daily;Understanding of distribution of calorie intake throughout the day with the consumption of 4-5 meals/snacks    Improve shortness of breath with ADL's Yes    Intervention Provide education, individualized exercise plan and daily activity instruction to help decrease symptoms of SOB with activities of daily living.    Expected Outcomes Short Term: Improve cardiorespiratory fitness to achieve a reduction of symptoms when performing ADLs;Long Term: Be able to perform more ADLs without symptoms or delay the onset of symptoms    Increase knowledge of respiratory medications and ability to use respiratory devices properly  Yes    Intervention Provide education and demonstration as needed of appropriate use of medications,  inhalers, and oxygen therapy.    Expected Outcomes Short Term: Achieves understanding of medications use. Understands that oxygen is a medication prescribed by physician. Demonstrates appropriate use of inhaler and oxygen therapy.;Long Term: Maintain appropriate use of medications, inhalers, and oxygen therapy.    Diabetes Yes    Intervention Provide education about signs/symptoms and action to take for hypo/hyperglycemia.;Provide education about proper nutrition, including hydration, and aerobic/resistive exercise prescription along with prescribed medications to achieve blood glucose in normal ranges: Fasting glucose 65-99 mg/dL    Expected Outcomes Short Term: Participant verbalizes understanding of the signs/symptoms and immediate care of hyper/hypoglycemia, proper foot care and importance of medication, aerobic/resistive exercise and nutrition plan for blood glucose control.;Long Term: Attainment of HbA1C < 7%.    Hypertension Yes    Intervention Provide education on lifestyle modifcations including regular physical activity/exercise, weight management, moderate sodium restriction and increased consumption of fresh fruit, vegetables, and low fat dairy, alcohol moderation, and smoking cessation.;Monitor prescription use compliance.    Expected Outcomes Short Term: Continued assessment and intervention until BP is < 140/54mm HG in hypertensive participants. < 130/35mm HG in hypertensive participants with diabetes, heart failure or chronic kidney disease.;Long Term: Maintenance of blood pressure at goal levels.    Lipids Yes    Intervention Provide education and support for participant on nutrition & aerobic/resistive exercise along with prescribed medications to achieve LDL 70mg , HDL >40mg .    Expected Outcomes Short Term: Participant states understanding of desired cholesterol values and is compliant with medications prescribed. Participant is following exercise prescription and nutrition  guidelines.;Long Term: Cholesterol controlled with medications as prescribed, with individualized exercise RX and with personalized nutrition plan. Value goals: LDL < , HDL > 40 mg.             Education:Diabetes - Individual verbal and written instruction to review signs/symptoms of diabetes, desired ranges of glucose level fasting, after meals and with exercise. Acknowledge that pre and post exercise glucose checks will be done for 3 sessions at entry of program. Flowsheet Row Cardiac Rehab from 10/20/2022 in [facility] Cardiac and Pulmonary Rehab  Date 10/20/22  Educator [facility]  Instruction Review Code 1- Verbalizes Understanding       Core Components/Risk Factors/Patient Goals Review:  Core Components/Risk Factors/Patient Goals at Discharge (Final Review):    ITP Comments:  ITP Comments     Row Name 10/12/22 1324 10/20/22 1229         ITP Comments Initial phone call completed. Diagnosis can be found in [facility] 11/14. EP Orientation scheduled for Wednesday 11/29 at 10:30. Completed and gym orientation. Initial ITP created and sent for review to Dr. Bethann Punches, Medical Director.               Comments: initial ITP

## 2022-10-22 ENCOUNTER — Telehealth: Payer: Self-pay | Admitting: *Deleted

## 2022-10-22 NOTE — Patient Outreach (Signed)
  Care Coordination   10/22/2022 Name: [name] MRN: [id number] DOB: May 08, 1956   Care Coordination Outreach Attempts:  An unsuccessful telephone outreach was attempted today to offer the patient information about available care coordination services as a benefit of their health plan.   Follow Up Plan:  Additional outreach attempts will be made to offer the patient care coordination information and services.   Encounter Outcome:  No Answer   Care Coordination Interventions:  No, not indicated    Kemper Durie, RN, MSN, [facility] Care Management Care Management Coordinator [phone]

## 2022-10-25 ENCOUNTER — Encounter: Payer: PPO | Attending: Cardiovascular Disease | Admitting: *Deleted

## 2022-10-25 DIAGNOSIS — I2089 Other forms of angina pectoris: Secondary | ICD-10-CM | POA: Diagnosis not present

## 2022-10-25 LAB — GLUCOSE, CAPILLARY
Glucose-Capillary: 127 mg/dL — ABNORMAL HIGH (ref 70–99)
Glucose-Capillary: 138 mg/dL — ABNORMAL HIGH (ref 70–99)

## 2022-10-25 NOTE — Progress Notes (Signed)
Daily Session Note  Patient Details  Name: [name] MRN: [id number] Date of Birth: 24-Dec-1955 Referring Provider:   Flowsheet Row Cardiac Rehab from 10/20/2022 in [facility] Cardiac and Pulmonary Rehab  Referring Provider Gollan       Encounter Date: 10/25/2022  Check In:  Session Check In - 10/25/22 1012       Check-In   Supervising physician immediately available to respond to emergencies See telemetry face sheet for immediately available ER MD    Location [facility]-Cardiac & Pulmonary Rehab    Staff Present Darlyne Russian, RN, Doyce Para, BS, ACSM CEP, Exercise Physiologist;Joseph Tessie Fass, [place]    Virtual Visit No    Medication changes reported     No    Fall or balance concerns reported    No    Warm-up and Cool-down Performed on first and last piece of equipment    Resistance Training Performed Yes    VAD Patient? No    PAD/SET Patient? No      Pain Assessment   Currently in Pain? No/denies                Social History   Tobacco Use  Smoking Status Never  Smokeless Tobacco Never    Goals Met:  Independence with exercise equipment Exercise tolerated well No report of concerns or symptoms today Strength training completed today  Goals Unmet:  Not Applicable  Comments: First full day of exercise!  Patient was oriented to gym and equipment including functions, settings, policies, and procedures.  Patient's individual exercise prescription and treatment plan were reviewed.  All starting workloads were established based on the results of the 6 minute walk test done at initial orientation visit.  The plan for exercise progression was also introduced and progression will be customized based on patient's performance and goals.    Dr. Emily Filbert is Medical Director for [place].  Dr. Ottie Glazier is Medical Director for [facility] Pulmonary Rehabilitation.

## 2022-10-26 DIAGNOSIS — I2089 Other forms of angina pectoris: Secondary | ICD-10-CM

## 2022-10-26 NOTE — Progress Notes (Signed)
Completed initial RD consultation ?

## 2022-10-27 ENCOUNTER — Encounter: Payer: Self-pay | Admitting: *Deleted

## 2022-10-27 ENCOUNTER — Telehealth: Payer: Self-pay | Admitting: *Deleted

## 2022-10-27 ENCOUNTER — Encounter: Payer: PPO | Admitting: *Deleted

## 2022-10-27 DIAGNOSIS — I2089 Other forms of angina pectoris: Secondary | ICD-10-CM

## 2022-10-27 LAB — GLUCOSE, CAPILLARY
Glucose-Capillary: 135 mg/dL — ABNORMAL HIGH (ref 70–99)
Glucose-Capillary: 151 mg/dL — ABNORMAL HIGH (ref 70–99)

## 2022-10-27 NOTE — Progress Notes (Signed)
Daily Session Note  Patient Details  Name: [name] MRN: [id number] Date of Birth: Dec 10, 1955 Referring Provider:   Flowsheet Row Cardiac Rehab from 10/20/2022 in [facility] Cardiac and Pulmonary Rehab  Referring Provider Gollan       Encounter Date: 10/27/2022  Check In:  Session Check In - 10/27/22 1200       Check-In   Supervising physician immediately available to respond to emergencies See telemetry face sheet for immediately available ER MD    Location [facility]-Cardiac & Pulmonary Rehab    Staff Present Nyoka Cowden, RN, BSN, Lauretta Grill, RCP,RRT,BSRT;Noah Tickle, BS, Exercise Physiologist;Laureen Owens [place], [place], RRT, CPFT    Virtual Visit No    Medication changes reported     No    Fall or balance concerns reported    No    Tobacco Cessation No Change    Warm-up and Cool-down Performed on first and last piece of equipment    Resistance Training Performed Yes    VAD Patient? No    PAD/SET Patient? No      Pain Assessment   Currently in Pain? No/denies                Social History   Tobacco Use  Smoking Status Never  Smokeless Tobacco Never    Goals Met:  Independence with exercise equipment Exercise tolerated well No report of concerns or symptoms today  Goals Unmet:  Not Applicable  Comments: Pt able to follow exercise prescription today without complaint.  Will continue to monitor for progression.    Dr. Emily Filbert is Medical Director for [place].  Dr. Ottie Glazier is Medical Director for [facility] Pulmonary Rehabilitation.

## 2022-10-27 NOTE — Patient Instructions (Signed)
Visit Information  Thank you for taking time to visit with me today. Please don't hesitate to contact me if I can be of assistance to you.  Following are the goals we discussed today:  Continue monitoring BP and oxygen levels.   Continue cardiac rehab sessions as scheduled.   Please call the Suicide and Crisis Lifeline: 988 call the [place] National Suicide Prevention Lifeline: 509-681-8715 or TTY: 3647723937 TTY 320-530-2443) to talk to a trained counselor call 1-800-273-TALK (toll free, 24 hour hotline) call 911 if you are experiencing a Mental Health or Behavioral Health Crisis or need someone to talk to.  Patient verbalizes understanding of instructions and care plan provided today and agrees to view in MyChart. Active MyChart status and patient understanding of how to access instructions and care plan via MyChart confirmed with patient.     The patient has been provided with contact information for the care management team and has been advised to call with any health related questions or concerns.   Kemper Durie, RN, MSN, [facility] Care Management Care Management Coordinator [phone]

## 2022-10-27 NOTE — Patient Outreach (Signed)
  Care Coordination   Initial Visit Note   10/27/2022 Name: [name] MRN: [id number] DOB: 09-19-1956  [name] is a 66 y.o. year old male who sees Lee Baseman, MD for primary care. I spoke with  [name] by phone today.  What matters to the patients health and wellness today?  Has stable angina, currently active with cardiac rehab. Does not request follow up, will call with questions.     Goals Addressed             This Visit's Progress    COMPLETED: Management of chornic stable angina       Care Coordination Interventions: Provided education on importance of blood pressure control in management of CAD Provided education on Importance of limiting foods high in cholesterol Counseled on the importance of exercise goals with target of 150 minutes per week Reviewed Importance of taking all medications as prescribed Reviewed Importance of attending all scheduled provider appointments Screening for signs and symptoms of depression related to chronic disease state Assessed social determinant of health barriers Discussed involvement with cardiac rehab throughout Feb 2024.  Will follow up with cardiology in May 2024         SDOH assessments and interventions completed:  Yes  SDOH Interventions Today    Flowsheet Row Most Recent Value  SDOH Interventions   Food Insecurity Interventions Intervention Not Indicated  Housing Interventions Intervention Not Indicated  Transportation Interventions Intervention Not Indicated        Care Coordination Interventions:  Yes, provided   Follow up plan: No further intervention required.   Encounter Outcome:  Pt. Visit Completed   Kemper Durie, RN, MSN, [facility] Care Management Care Management Coordinator [phone]

## 2022-10-29 ENCOUNTER — Encounter: Payer: PPO | Admitting: *Deleted

## 2022-10-29 DIAGNOSIS — I2089 Other forms of angina pectoris: Secondary | ICD-10-CM | POA: Diagnosis not present

## 2022-10-29 LAB — GLUCOSE, CAPILLARY
Glucose-Capillary: 116 mg/dL — ABNORMAL HIGH (ref 70–99)
Glucose-Capillary: 126 mg/dL — ABNORMAL HIGH (ref 70–99)

## 2022-10-29 NOTE — Progress Notes (Signed)
Daily Session Note  Patient Details  Name: JUNE VACHA MRN: [id number] Date of Birth: 07-27-1956 Referring Provider:   Flowsheet Row Cardiac Rehab from 10/20/2022 in [facility] Cardiac and Pulmonary Rehab  Referring Provider Gollan       Encounter Date: 10/29/2022  Check In:  Session Check In - 10/29/22 0839       Check-In   Supervising physician immediately available to respond to emergencies See telemetry face sheet for immediately available ER MD    Location [facility]-Cardiac & Pulmonary Rehab    Staff Present Heath Lark, RN, BSN, CCRP;Jessica [place], MA, RCEP, CCRP, CCET;Joseph [place], [place]    Virtual Visit No    Medication changes reported     No    Fall or balance concerns reported    No    Warm-up and Cool-down Performed on first and last piece of equipment    Resistance Training Performed Yes    VAD Patient? No    PAD/SET Patient? No      Pain Assessment   Currently in Pain? No/denies                Social History   Tobacco Use  Smoking Status Never  Smokeless Tobacco Never    Goals Met:  Independence with exercise equipment Exercise tolerated well No report of concerns or symptoms today  Goals Unmet:  Not Applicable  Comments: Pt able to follow exercise prescription today without complaint.  Will continue to monitor for progression.    Dr. Emily Filbert is Medical Director for [place].  Dr. Ottie Glazier is Medical Director for [facility] Pulmonary Rehabilitation.

## 2022-11-01 ENCOUNTER — Encounter: Payer: PPO | Admitting: *Deleted

## 2022-11-01 DIAGNOSIS — I2089 Other forms of angina pectoris: Secondary | ICD-10-CM | POA: Diagnosis not present

## 2022-11-01 NOTE — Progress Notes (Signed)
Daily Session Note  Patient Details  Name: [name] MRN: [id number] Date of Birth: 1956-10-22 Referring Provider:   Flowsheet Row Cardiac Rehab from 10/20/2022 in [facility] Cardiac and Pulmonary Rehab  Referring Provider Gollan       Encounter Date: 11/01/2022  Check In:  Session Check In - 11/01/22 0747       Check-In   Supervising physician immediately available to respond to emergencies See telemetry face sheet for immediately available ER MD    Location [facility]-Cardiac & Pulmonary Rehab    Staff Present Darlyne Russian, RN, Doyce Para, BS, ACSM CEP, Exercise Physiologist;Joseph Tessie Fass, [place]    Virtual Visit No    Medication changes reported     No    Fall or balance concerns reported    No    Warm-up and Cool-down Performed on first and last piece of equipment    Resistance Training Performed Yes    VAD Patient? No    PAD/SET Patient? No      Pain Assessment   Currently in Pain? No/denies                Social History   Tobacco Use  Smoking Status Never  Smokeless Tobacco Never    Goals Met:  Independence with exercise equipment Exercise tolerated well No report of concerns or symptoms today Strength training completed today  Goals Unmet:  Not Applicable  Comments: Pt able to follow exercise prescription today without complaint.  Will continue to monitor for progression.    Dr. Emily Filbert is Medical Director for [place].  Dr. Ottie Glazier is Medical Director for [facility] Pulmonary Rehabilitation.

## 2022-11-03 ENCOUNTER — Encounter: Payer: PPO | Admitting: *Deleted

## 2022-11-03 DIAGNOSIS — I2089 Other forms of angina pectoris: Secondary | ICD-10-CM | POA: Diagnosis not present

## 2022-11-03 NOTE — Progress Notes (Signed)
Daily Session Note  Patient Details  Name: [name] MRN: [id number] Date of Birth: 01/17/1956 Referring Provider:   Flowsheet Row Cardiac Rehab from 10/20/2022 in [facility] Cardiac and Pulmonary Rehab  Referring Provider Gollan       Encounter Date: 11/03/2022  Check In:  Session Check In - 11/03/22 0807       Check-In   Supervising physician immediately available to respond to emergencies See telemetry face sheet for immediately available ER MD    Location [facility]-Cardiac & Pulmonary Rehab    Staff Present  , RN, ADN;Melissa Caiola, RDN, LDN;Joseph Hood, RCP,RRT,BSRT    Virtual Visit No    Medication changes reported     No    Fall or balance concerns reported    No    Warm-up and Cool-down Performed on first and last piece of equipment    Resistance Training Performed Yes    VAD Patient? No    PAD/SET Patient? No      Pain Assessment   Currently in Pain? No/denies                Social History   Tobacco Use  Smoking Status Never  Smokeless Tobacco Never    Goals Met:  Independence with exercise equipment Exercise tolerated well No report of concerns or symptoms today Strength training completed today  Goals Unmet:  Not Applicable  Comments: Pt able to follow exercise prescription today without complaint.  Will continue to monitor for progression.    Dr. Mark Miller is Medical Director for [facility] Cardiac [facility].  Dr. Fuad Aleskerov is Medical Director for [facility] Pulmonary Rehabilitation. 

## 2022-11-08 ENCOUNTER — Encounter: Payer: PPO | Admitting: *Deleted

## 2022-11-08 DIAGNOSIS — I2089 Other forms of angina pectoris: Secondary | ICD-10-CM

## 2022-11-08 NOTE — Progress Notes (Signed)
Daily Session Note  Patient Details  Name: [name] MRN: [id number] Date of Birth: 11/27/1955 Referring Provider:   Flowsheet Row Cardiac Rehab from 10/20/2022 in [facility] Cardiac and Pulmonary Rehab  Referring Provider Gollan       Encounter Date: 11/08/2022  Check In:  Session Check In - 11/08/22 0818       Check-In   Supervising physician immediately available to respond to emergencies See telemetry face sheet for immediately available ER MD    Location [facility]-Cardiac & Pulmonary Rehab    Staff Present  , RN, ADN;Kelly Hayes, BS, ACSM CEP, Exercise Physiologist;Joseph Hood, RCP,RRT,BSRT    Virtual Visit No    Medication changes reported     No    Fall or balance concerns reported    No    Warm-up and Cool-down Performed on first and last piece of equipment    Resistance Training Performed Yes    VAD Patient? No    PAD/SET Patient? No      Pain Assessment   Currently in Pain? No/denies                Social History   Tobacco Use  Smoking Status Never  Smokeless Tobacco Never    Goals Met:  Independence with exercise equipment Exercise tolerated well No report of concerns or symptoms today Strength training completed today  Goals Unmet:  Not Applicable  Comments: Pt able to follow exercise prescription today without complaint.  Will continue to monitor for progression.    Dr. Mark Miller is Medical Director for [facility].  Dr. Fuad Aleskerov is Medical Director for [facility] Pulmonary Rehabilitation. 

## 2022-11-10 ENCOUNTER — Encounter: Payer: PPO | Admitting: *Deleted

## 2022-11-10 DIAGNOSIS — I2089 Other forms of angina pectoris: Secondary | ICD-10-CM | POA: Diagnosis not present

## 2022-11-10 NOTE — Progress Notes (Signed)
Daily Session Note  Patient Details  Name: [name] MRN: [id number] Date of Birth: 25-Apr-1956 Referring Provider:   Flowsheet Row Cardiac Rehab from 10/20/2022 in [facility] Cardiac and Pulmonary Rehab  Referring Provider Gollan       Encounter Date: 11/10/2022  Check In:  Session Check In - 11/10/22 0810       Check-In   Supervising physician immediately available to respond to emergencies See telemetry face sheet for immediately available ER MD    Location [facility]-Cardiac & Pulmonary Rehab    Staff Present Heath Lark, RN, BSN, CCRP;Joseph [place], RCP,RRT,BSRT;Noah Tickle, [place], Exercise Physiologist    Virtual Visit No    Medication changes reported     No    Fall or balance concerns reported    No    Warm-up and Cool-down Performed on first and last piece of equipment    Resistance Training Performed Yes    VAD Patient? No    PAD/SET Patient? No      Pain Assessment   Currently in Pain? No/denies                Social History   Tobacco Use  Smoking Status Never  Smokeless Tobacco Never    Goals Met:  Independence with exercise equipment Exercise tolerated well No report of concerns or symptoms today  Goals Unmet:  Not Applicable  Comments: Pt able to follow exercise prescription today without complaint.  Will continue to monitor for progression.    Dr. Emily Filbert is Medical Director for [place].  Dr. Ottie Glazier is Medical Director for [facility] Pulmonary Rehabilitation.

## 2022-11-12 ENCOUNTER — Encounter: Payer: PPO | Admitting: *Deleted

## 2022-11-12 DIAGNOSIS — I2089 Other forms of angina pectoris: Secondary | ICD-10-CM

## 2022-11-12 NOTE — Progress Notes (Signed)
Daily Session Note  Patient Details  Name: AISEA BOULDIN MRN: [id number] Date of Birth: Apr 09, 1956 Referring Provider:   Flowsheet Row Cardiac Rehab from 10/20/2022 in [facility] Cardiac and Pulmonary Rehab  Referring Provider Gollan       Encounter Date: 11/12/2022  Check In:  Session Check In - 11/12/22 0834       Check-In   Supervising physician immediately available to respond to emergencies See telemetry face sheet for immediately available ER MD    Location [facility]-Cardiac & Pulmonary Rehab    Staff Present Heath Lark, RN, BSN, CCRP;Jessica [place], MA, RCEP, CCRP, CCET;Joseph [place], [place]    Virtual Visit No    Medication changes reported     No    Fall or balance concerns reported    No    Warm-up and Cool-down Performed on first and last piece of equipment    Resistance Training Performed Yes    VAD Patient? No    PAD/SET Patient? No      Pain Assessment   Currently in Pain? No/denies                Social History   Tobacco Use  Smoking Status Never  Smokeless Tobacco Never    Goals Met:  Independence with exercise equipment Exercise tolerated well No report of concerns or symptoms today  Goals Unmet:  Not Applicable  Comments: Pt able to follow exercise prescription today without complaint.  Will continue to monitor for progression.    Dr. Emily Filbert is Medical Director for [place].  Dr. Ottie Glazier is Medical Director for [facility] Pulmonary Rehabilitation.

## 2022-11-17 ENCOUNTER — Encounter: Payer: PPO | Admitting: *Deleted

## 2022-11-17 ENCOUNTER — Encounter: Payer: Self-pay | Admitting: *Deleted

## 2022-11-17 DIAGNOSIS — I2089 Other forms of angina pectoris: Secondary | ICD-10-CM

## 2022-11-17 NOTE — Progress Notes (Signed)
Daily Session Note  Patient Details  Name: [name] MRN: [id number] Date of Birth: 1956-11-09 Referring Provider:   Flowsheet Row Cardiac Rehab from 10/20/2022 in [facility] Cardiac and Pulmonary Rehab  Referring Provider Gollan       Encounter Date: 11/17/2022  Check In:  Session Check In - 11/17/22 0838       Check-In   Supervising physician immediately available to respond to emergencies See telemetry face sheet for immediately available ER MD    Location [facility]-Cardiac & Pulmonary Rehab    Staff Present Darlyne Russian, RN, ADN;Jessica Luan [place], MA, RCEP, CCRP, CCET;Noah Tickle, BS, Exercise Physiologist    Virtual Visit No    Medication changes reported     No    Fall or balance concerns reported    No    Warm-up and Cool-down Performed on first and last piece of equipment    Resistance Training Performed Yes    VAD Patient? No    PAD/SET Patient? No      Pain Assessment   Currently in Pain? No/denies                Social History   Tobacco Use  Smoking Status Never  Smokeless Tobacco Never    Goals Met:  Independence with exercise equipment Exercise tolerated well No report of concerns or symptoms today Strength training completed today  Goals Unmet:  Not Applicable  Comments: Pt able to follow exercise prescription today without complaint.  Will continue to monitor for progression.    Dr. Emily Filbert is Medical Director for [place].  Dr. Ottie Glazier is Medical Director for [facility] Pulmonary Rehabilitation.

## 2022-11-17 NOTE — Progress Notes (Signed)
Cardiac Individual Treatment Plan  Patient Details  Name: [name] MRN: [id number] Date of Birth: [birth year] Referring Provider:   Flowsheet Row Cardiac Rehab from 10/20/2022 in [facility] Cardiac and Pulmonary Rehab  Referring Provider Gollan       Initial Encounter Date:  Flowsheet Row Cardiac Rehab from 10/20/2022 in [facility] Cardiac and Pulmonary Rehab  Date 10/20/22       Visit Diagnosis: Chronic stable angina  Patient's Home Medications on Admission:  Current Outpatient Medications:    acetaminophen (TYLENOL) 325 MG tablet, Take 650 mg by mouth every 6 (six) hours as needed for moderate pain or headache. , Disp: , Rfl:    albuterol (PROVENTIL HFA;VENTOLIN HFA) 108 (90 BASE) MCG/ACT inhaler, Inhale 1-2 puffs into the lungs every 6 (six) hours as needed for wheezing or shortness of breath. , Disp: , Rfl:    alfuzosin (UROXATRAL) 10 MG 24 hr tablet, Take 10 mg by mouth daily., Disp: , Rfl:    Amphetamine-Dextroamphetamine (ADDERALL PO), Take by mouth as needed., Disp: , Rfl:    ASPIRIN 81 PO, Take by mouth daily., Disp: , Rfl:    atorvastatin (LIPITOR) 40 MG tablet, Take 40 mg by mouth daily. , Disp: , Rfl:    cetirizine (ZYRTEC) 10 MG tablet, Take 10 mg by mouth daily as needed for allergies. , Disp: , Rfl:    diphenhydrAMINE (BENADRYL) 50 MG capsule, Take 50 mg by mouth at bedtime as needed for sleep., Disp: , Rfl:    ezetimibe (ZETIA) 10 MG tablet, TAKE 1 TABLET BY MOUTH EVERY DAY, Disp: 90 tablet, Rfl: 2   fluticasone (FLONASE) 50 MCG/ACT nasal spray, Place into the nose., Disp: , Rfl:    fluticasone-salmeterol (ADVAIR HFA) 115-21 MCG/ACT inhaler, Inhale 2 puffs into the lungs 2 (two) times daily., Disp: 1 Inhaler, Rfl: 6   losartan (COZAAR) 50 MG tablet, Take 50 mg by mouth daily., Disp: , Rfl:    metFORMIN (GLUCOPHAGE) 850 MG tablet, Take 850 mg by mouth daily with breakfast. , Disp: , Rfl:    metoprolol succinate (TOPROL-XL) 25 MG 24 hr tablet, Take by mouth., Disp: , Rfl:     mirabegron ER (MYRBETRIQ) 25 MG TB24 tablet, , Disp: , Rfl:    nitroGLYCERIN (NITROSTAT) 0.4 MG SL tablet, Place 1 tablet (0.4 mg total) under the tongue every 5 (five) minutes as needed for chest pain., Disp: 25 tablet, Rfl: 3   RABEprazole (ACIPHEX) 20 MG tablet, Take 20 mg by mouth daily. , Disp: , Rfl:    TRADJENTA 5 MG TABS tablet, Take 5 mg by mouth daily., Disp: , Rfl:    traZODone (DESYREL) 100 MG tablet, TAKE 1/2 TO 1 TABLET BY MOUTH NIGHTLY., Disp: , Rfl:    venlafaxine XR (EFFEXOR-XR) 75 MG 24 hr capsule, Take 225 mg by mouth daily., Disp: , Rfl:   Past Medical History: Past Medical History:  Diagnosis Date   Anxiety    Atherosclerosis of coronary artery bypass graft with angina pectoris (HCC)    Barrett's esophagus    Benign enlargement of prostate    Chronic constipation    Colon polyp    Coronary artery disease    COVID-19    1/22 3/22   Depression    GERD (gastroesophageal reflux disease)    H/O esophageal spasm    Hyperlipidemia    Hypertension    MI (myocardial infarction) ([place]) 2011 & 2014   x 2   Post-COVID chronic cough    Sleep apnea  after weight loss no need for CPAP   TIA (transient ischemic attack)    "many yrs ago" - no deficits   Type 2 diabetes mellitus ([place])    Wears hearing aid in both ears     Tobacco Use: Social History   Tobacco Use  Smoking Status Never  Smokeless Tobacco Never    Labs: Review Flowsheet       Latest Ref Rng & Units 04/22/2009 11/18/2009 11/19/2009 11/02/2018  Labs for ITP Cardiac and Pulmonary Rehab  Cholestrol 0 - 200 mg/dL - - 114        ATP III CLASSIFICATION:  <200     mg/dL   Desirable  200-239  mg/dL   Borderline High  >=240    mg/dL   High         -  LDL (calc) 0 - 99 mg/dL - - 70        Total Cholesterol/HDL:CHD Risk Coronary Heart Disease Risk Table                     Men   Women  1/2 Average Risk   3.4   3.3  Average Risk       5.0   4.4  2 X Average Risk   9.6   7.1  3 X Average Risk   23.4   11.0        Use the calculated Patient Ratio above and the CHD Risk Table to determine the patient's CHD Risk.        ATP III CLASSIFICATION (LDL):  <100     mg/dL   Optimal  100-129  mg/dL   Near or Above                    Optimal  130-159  mg/dL   Borderline  160-189  mg/dL   High  >190     mg/dL   Very High  -  HDL-C >39 mg/dL - - 29  -  Trlycerides <150 mg/dL - - 75  -  Hemoglobin A1c 4.8 - 5.6 % - 5.7 (NOTE) The ADA recommends the following therapeutic goal for glycemic control related to Hgb A1c measurement: Goal of therapy: <6.5 Hgb A1c  Reference: American Diabetes Association: Clinical Practice Recommendations 2010, Diabetes Care, 2010, 33: (Suppl  1).  - 4.8   TCO2 0 - 100 mmol/L 21  - - -     Exercise Target Goals: Exercise Program Goal: Individual exercise prescription set using results from initial 6 min walk test and THRR while considering  patient's activity barriers and safety.   Exercise Prescription Goal: Initial exercise prescription builds to 30-45 minutes a day of aerobic activity, 2-3 days per week.  Home exercise guidelines will be given to patient during program as part of exercise prescription that the participant will acknowledge.   Education: Aerobic Exercise: - Group verbal and visual presentation on the components of exercise prescription. Introduces F.I.T.T principle from ACSM for exercise prescriptions.  Reviews F.I.T.T. principles of aerobic exercise including progression. Written material given at graduation. Flowsheet Row Cardiac Rehab from 11/17/2022 in [facility] Cardiac and Pulmonary Rehab  Education need identified 10/20/22       Education: Resistance Exercise: - Group verbal and visual presentation on the components of exercise prescription. Introduces F.I.T.T principle from ACSM for exercise prescriptions  Reviews F.I.T.T. principles of resistance exercise including progression. Written material given at graduation.    Education:  Exercise & Equipment Safety: -  Individual verbal instruction and demonstration of equipment use and safety with use of the equipment. Flowsheet Row Cardiac Rehab from 11/17/2022 in [facility] Cardiac and Pulmonary Rehab  Date 10/20/22  Educator [facility]  Instruction Review Code 1- Verbalizes Understanding       Education: Exercise Physiology & General Exercise Guidelines: - Group verbal and written instruction with models to review the exercise physiology of the cardiovascular system and associated critical values. Provides general exercise guidelines with specific guidelines to those with heart or lung disease.    Education: Flexibility, Balance, Mind/Body Relaxation: - Group verbal and visual presentation with interactive activity on the components of exercise prescription. Introduces F.I.T.T principle from ACSM for exercise prescriptions. Reviews F.I.T.T. principles of flexibility and balance exercise training including progression. Also discusses the mind body connection.  Reviews various relaxation techniques to help reduce and manage stress (i.e. Deep breathing, progressive muscle relaxation, and visualization). Balance handout provided to take home. Written material given at graduation. Flowsheet Row Pulmonary Rehab from 06/02/2015 in [place]  Date 05/12/15  Educator [facility] [place]  Instruction Review Code (retired) 2- meets goals/outcomes       Activity Barriers & Risk Stratification:  Activity Barriers & Cardiac Risk Stratification - 10/20/22 1232       Activity Barriers & Cardiac Risk Stratification   Activity Barriers Left Knee Replacement;Right Knee Replacement    Cardiac Risk Stratification High             6 Minute Walk:  6 Minute Walk     Row Name 10/20/22 1229         6 Minute Walk   Phase Initial     Distance 1430 feet     Walk Time 6 minutes     # of Rest Breaks 0     MPH 2.71     METS 3.17     RPE 17     Perceived  Dyspnea  3     VO2 Peak 11.11     Symptoms No     Resting HR 74 bpm     Resting BP 122/80     Resting Oxygen Saturation  99 %     Exercise Oxygen Saturation  during 6 min walk 99 %     Max Ex. HR 118 bpm     Max Ex. BP 160/72     2 Minute Post BP 128/82              Oxygen Initial Assessment:   Oxygen Re-Evaluation:   Oxygen Discharge (Final Oxygen Re-Evaluation):   Initial Exercise Prescription:  Initial Exercise Prescription - 10/20/22 1200       Date of Initial Exercise RX and Referring Provider   Date 10/20/22    Referring Provider Gollan      Oxygen   Maintain Oxygen Saturation 88% or higher      Treadmill   MPH 2.3    Grade 1    Minutes 15    METs 3.08      Arm Ergometer   Level 1    RPM 30    Minutes 15    METs 3.17      REL-XR   Level 2    Speed 50    Minutes 15    METs 3.17      Track   Laps 30    Minutes 15    METs 2.63      Prescription Details   Frequency (times per week)  3    Duration Progress to 30 minutes of continuous aerobic without signs/symptoms of physical distress      Intensity   THRR 40-80% of Max Heartrate 105-136    Ratings of Perceived Exertion 11-13    Perceived Dyspnea 0-4      Progression   Progression Continue to progress workloads to maintain intensity without signs/symptoms of physical distress.      Resistance Training   Training Prescription Yes    Weight 5    Reps 10-15             Perform Capillary Blood Glucose checks as needed.  Exercise Prescription Changes:   Exercise Prescription Changes     Row Name 10/20/22 1200 11/01/22 1500 11/08/22 0800 11/16/22 1400       Response to Exercise   Blood Pressure (Admit) 122/80 118/62 -- 124/70    Blood Pressure (Exercise) 160/72 132/74 -- 138/68    Blood Pressure (Exit) 120/70 124/70 -- 112/66    Heart Rate (Admit) 74 bpm 86 bpm -- 87 bpm    Heart Rate (Exercise) 118 bpm 117 bpm -- 127 bpm    Heart Rate (Exit) 91 bpm 82 bpm -- 84 bpm     Oxygen Saturation (Exercise) 99 % -- -- --    Oxygen Saturation (Exit) 99 % -- -- --    Rating of Perceived Exertion (Exercise) 99 13 -- 14    Perceived Dyspnea (Exercise) 3 -- -- --    Symptoms none none -- none    Comments 6MWT results 3rd full session of exercise -- --    Duration -- Progress to 30 minutes of  aerobic without signs/symptoms of physical distress -- Continue with 30 min of aerobic exercise without signs/symptoms of physical distress.    Intensity -- THRR unchanged -- THRR unchanged      Progression   Progression -- Continue to progress workloads to maintain intensity without signs/symptoms of physical distress. -- Continue to progress workloads to maintain intensity without signs/symptoms of physical distress.    Average METs -- 3.55 -- 3.46      Resistance Training   Training Prescription -- Yes -- Yes    Weight -- 5 lb -- 5 lb    Reps -- 10-15 -- 10-15      Interval Training   Interval Training -- No -- No      Treadmill   MPH -- 2.3 -- 2.3    Grade -- 1 -- 1.5    Minutes -- 15 -- 15    METs -- 3.08 -- 3.23      Arm Ergometer   Level -- -- -- 1    Minutes -- -- -- 15    METs -- -- -- 1      REL-XR   Level -- 9 -- 7    Minutes -- 15 -- 15    METs -- 3.2 -- 5      Biostep-RELP   Level -- 5 -- 4    Minutes -- 15 -- 15    METs -- 4 -- 4      Track   Laps -- 9 -- --    Minutes -- 15 -- --    METs -- 1.49 -- --      Home Exercise Plan   Plans to continue exercise at -- -- [employer] (comment)  [employer], [employer], walking [employer] (comment)  [employer], [employer], walking    Frequency -- -- Add 2  additional days to program exercise sessions. Add 2 additional days to program exercise sessions.    Initial Home Exercises Provided -- -- 11/08/22 11/08/22      Oxygen   Maintain Oxygen Saturation -- -- -- 88% or higher             Exercise Comments:   Exercise Comments     Row Name 10/25/22 1013           Exercise Comments First  full day of exercise!  Patient was oriented to gym and equipment including functions, settings, policies, and procedures.  Patient's individual exercise prescription and treatment plan were reviewed.  All starting workloads were established based on the results of the 6 minute walk test done at initial orientation visit.  The plan for exercise progression was also introduced and progression will be customized based on patient's performance and goals.                Exercise Goals and Review:   Exercise Goals     Row Name 10/20/22 1238             Exercise Goals   Increase Physical Activity Yes       Intervention Provide advice, education, support and counseling about physical activity/exercise needs.;Develop an individualized exercise prescription for aerobic and resistive training based on initial evaluation findings, risk stratification, comorbidities and participant's personal goals.       Expected Outcomes Short Term: Attend rehab on a regular basis to increase amount of physical activity.;Long Term: Add in home exercise to make exercise part of routine and to increase amount of physical activity.;Long Term: Exercising regularly at least 3-5 days a week.       Increase Strength and Stamina Yes       Intervention Provide advice, education, support and counseling about physical activity/exercise needs.;Develop an individualized exercise prescription for aerobic and resistive training based on initial evaluation findings, risk stratification, comorbidities and participant's personal goals.       Expected Outcomes Short Term: Increase workloads from initial exercise prescription for resistance, speed, and METs.;Short Term: Perform resistance training exercises routinely during rehab and add in resistance training at home;Long Term: Improve cardiorespiratory fitness, muscular endurance and strength as measured by increased METs and functional capacity (6MWT)       Able to understand and use  rate of perceived exertion (RPE) scale Yes       Intervention Provide education and explanation on how to use RPE scale       Expected Outcomes Short Term: Able to use RPE daily in rehab to express subjective intensity level;Long Term:  Able to use RPE to guide intensity level when exercising independently       Able to understand and use Dyspnea scale Yes       Intervention Provide education and explanation on how to use Dyspnea scale       Expected Outcomes Short Term: Able to use Dyspnea scale daily in rehab to express subjective sense of shortness of breath during exertion;Long Term: Able to use Dyspnea scale to guide intensity level when exercising independently       Knowledge and understanding of Target Heart Rate Range (THRR) Yes       Intervention Provide education and explanation of THRR including how the numbers were predicted and where they are located for reference       Expected Outcomes Short Term: Able to state/look up THRR;Long Term: Able to use THRR to govern intensity when  exercising independently;Short Term: Able to use daily as guideline for intensity in rehab       Able to check pulse independently Yes       Intervention Provide education and demonstration on how to check pulse in carotid and radial arteries.;Review the importance of being able to check your own pulse for safety during independent exercise       Expected Outcomes Short Term: Able to explain why pulse checking is important during independent exercise       Understanding of Exercise Prescription Yes       Intervention Provide education, explanation, and written materials on patient's individual exercise prescription       Expected Outcomes Short Term: Able to explain program exercise prescription;Long Term: Able to explain home exercise prescription to exercise independently                Exercise Goals Re-Evaluation :  Exercise Goals Re-Evaluation     Row Name 10/25/22 1013 11/01/22 1516 11/08/22 0823  11/16/22 1448       Exercise Goal Re-Evaluation   Exercise Goals Review Able to understand and use rate of perceived exertion (RPE) scale;Able to understand and use Dyspnea scale;Knowledge and understanding of Target Heart Rate Range (THRR);Understanding of Exercise Prescription Increase Physical Activity;Increase Strength and Stamina;Understanding of Exercise Prescription Increase Physical Activity;Increase Strength and Stamina;Understanding of Exercise Prescription;Able to understand and use rate of perceived exertion (RPE) scale;Knowledge and understanding of Target Heart Rate Range (THRR);Able to check pulse independently Increase Physical Activity;Increase Strength and Stamina;Understanding of Exercise Prescription    Comments Reviewed RPE scale, THR and program prescription with pt today.  Pt voiced understanding and was given a copy of goals to take home. [name] is off to a good start with rehab. He has already increased to fairly high workloads. He is at level 9 on the XR and level 5 on the Biostep. He, however, is limited with walking and was only able to walk 5 minutes on the treadmill and walked 9 laps on the track. We hope to see his walking ability increase over the time of the program. Will continue to monitor as he progresses. Reviewed home exercise with pt today.  Pt plans to walk and go to [facility] and [employer] for exercise.  Reviewed THR, pulse, RPE, sign and symptoms, pulse oximetery and when to call 911 or MD.  Also discussed weather considerations and indoor options.  Pt voiced understanding. [name] continues to do well in rehab. He had an overall average MET level of 3.46 METs over the past two weeks. He was also able to add a little more incline on the treadmill up to 1.5%, while maintaining a speed of 2.3 mph. He has tolerated 5 lb hand weights for resistance training as well. We will continue to monitor his progress in the program.    Expected Outcomes Short: Use RPE daily to regulate  intensity. Long: Follow program prescription in THR. Short: Continue to increase walking duration on both track and treadmill Long: Increase overall MET level Short: Use guidelines to for intensity during exercise Long: conitnue to improve stamina Short: Continue to increase workloads on seated machines. Long: conitnue to improve strength and stamina.             Discharge Exercise Prescription (Final Exercise Prescription Changes):  Exercise Prescription Changes - 11/16/22 1400       Response to Exercise   Blood Pressure (Admit) 124/70    Blood Pressure (Exercise) 138/68  Blood Pressure (Exit) 112/66    Heart Rate (Admit) 87 bpm    Heart Rate (Exercise) 127 bpm    Heart Rate (Exit) 84 bpm    Rating of Perceived Exertion (Exercise) 14    Symptoms none    Duration Continue with 30 min of aerobic exercise without signs/symptoms of physical distress.    Intensity THRR unchanged      Progression   Progression Continue to progress workloads to maintain intensity without signs/symptoms of physical distress.    Average METs 3.46      Resistance Training   Training Prescription Yes    Weight 5 lb    Reps 10-15      Interval Training   Interval Training No      Treadmill   MPH 2.3    Grade 1.5    Minutes 15    METs 3.23      Arm Ergometer   Level 1    Minutes 15    METs 1      REL-XR   Level 7    Minutes 15    METs 5      Biostep-RELP   Level 4    Minutes 15    METs 4      Home Exercise Plan   Plans to continue exercise at [employer] (comment)   [employer], [employer], walking   Frequency Add 2 additional days to program exercise sessions.    Initial Home Exercises Provided 11/08/22      Oxygen   Maintain Oxygen Saturation 88% or higher             Nutrition:  Target Goals: Understanding of nutrition guidelines, daily intake of sodium <156m, cholesterol <2023m calories 30% from fat and 7% or less from saturated fats, daily to have 5 or more  servings of fruits and vegetables.  Education: All About Nutrition: -Group instruction provided by verbal, written material, interactive activities, discussions, models, and posters to present general guidelines for heart healthy nutrition including fat, fiber, MyPlate, the role of sodium in heart healthy nutrition, utilization of the nutrition label, and utilization of this knowledge for meal planning. Follow up email sent as well. Written material given at graduation. [facility]  Education need identified 10/20/22       Biometrics:  Pre Biometrics - 10/20/22 1239       Pre Biometrics   Height _0  (1.778 m)    Weight 252 lb 9.6 oz (114.6 kg)    Waist Circumference 50 inches    Hip Circumference 51.5 inches    Waist to Hip Ratio 0.97 %    BMI (Calculated) 36.24    Single Leg Stand 4.06 seconds              Nutrition Therapy Plan and Nutrition Goals:  Nutrition Therapy & Goals - 10/26/22 0802       Nutrition Therapy   Diet Heart healthy, low Na, T2DM    Drug/Food Interactions Statins/Certain Fruits    Protein (specify units) 85-95g    Fiber 33 grams    Whole Grain Foods 3 servings    Saturated Fats 18 max. grams    Fruits and Vegetables 8 servings/day    Sodium 2 grams      Personal Nutrition Goals   Nutrition Goal ST: practice MyPlate guidelines, begin by adding in fiber rich foods to meals/snacks LT: limit saturated fat <18g/day, limit Na <2g/day, maintain  A1C <7, include 33g fiber/day    Comments 66 y.o. M admitted to cardiac rehab with chronic stable angina. PMHx includes CAD, HLD, HTN, OSA, T2DM, GERD, anxiety, TIA, chronic constipation. PSHX includes CABG (2014), bilateral TKA. Relevant medications includes adderall, lipitor, fluticasone-salmeterol inhaler, metformin, rabeprazole, tradjenta, trazodone, venlafaxine, zetia. Last A1C 03/18/22 was 7.3. showed PYP Score: 44. Vegetables & Fruits 3/12.  Breads, Grains & Cereals 4/12. Red & Processed Meat 7/12. Poultry 2/2. Fish & Shellfish 0/4. Beans, Nuts & Seeds 2/4. Milk & Dairy Foods 3/6. Toppings, Oils, Seasonings & Salt 13/20. Sweets, Snacks & Restaurant Food 4/14. Beverages 6/10. B; does not usually eat breakfast. L around 1-2pm: usually something light and he will take his mom out to lunch (skids: burger with fries, biscuit with gravy) D: his wife cooks every night (hamburger or chicken on grill) with sometimes a baked potato. He reports not eating as much as he used to; he reports that he used to weigh 300lbs. He reports a lot of stress from his 20 y.o. mother as she has alzheimer's. Diet and caffiene free soda and some water as well as sweet tea. He reports the stress will cause him to snack like ice cream, peanut butter cracker, candy bars sometimes, cashews or peanuts. He uses whole wheat bread, but eats bread less often. He reports his wife follows a keto diet. Discussed heart healthy eating, T2DM MNT.      Intervention Plan   Intervention Prescribe, educate and counsel regarding individualized specific dietary modifications aiming towards targeted core components such as weight, hypertension, lipid management, diabetes, heart failure and other comorbidities.    Expected Outcomes Short Term Goal: Understand basic principles of dietary content, such as calories, fat, sodium, cholesterol and nutrients.;Short Term Goal: A plan has been developed with personal nutrition goals set during dietitian appointment.;Long Term Goal: Adherence to prescribed nutrition plan.             Nutrition Assessments:  MEDIFICTS Score Key: ?70 Need to make dietary changes  40-70 Heart Healthy Diet ? 40 Therapeutic Level Cholesterol Diet  Flowsheet Row Cardiac Rehab from 10/20/2022 in [facility] Cardiac and Pulmonary Rehab  Picture Your Plate Total Score on Admission 44      Picture Your Plate Scores: <28 Unhealthy dietary pattern with much room for  improvement. 41-50 Dietary pattern unlikely to meet recommendations for good health and room for improvement. 51-60 More healthful dietary pattern, with some room for improvement.  >60 Healthy dietary pattern, although there may be some specific behaviors that could be improved.    Nutrition Goals Re-Evaluation:  Nutrition Goals Re-Evaluation     [place] Name 11/08/22 0820             Goals   Nutrition Goal ST: practice MyPlate guidelines, begin by adding in fiber rich foods to meals/snacks LT: limit saturated fat <18g/day, limit Na <2g/day, maintain A1C <7, include 33g fiber/day       Comment [name] is doing well in rehab. He is frustrated that his weight is not budging.  He is eating fiber and getting enough protein.  He has been able to cut back on his sodium intake too.  He is feeling confident in what he is eating.  We encourged him to stick with it.       Expected Outcome Short: Stick to diet Long; Continue to focus on heart healthy eating                Nutrition Goals Discharge (Final Nutrition  Goals Re-Evaluation):  Nutrition Goals Re-Evaluation - 11/08/22 0820       Goals   Nutrition Goal ST: practice MyPlate guidelines, begin by adding in fiber rich foods to meals/snacks LT: limit saturated fat <18g/day, limit Na <2g/day, maintain A1C <7, include 33g fiber/day    Comment [name] is doing well in rehab. He is frustrated that his weight is not budging.  He is eating fiber and getting enough protein.  He has been able to cut back on his sodium intake too.  He is feeling confident in what he is eating.  We encourged him to stick with it.    Expected Outcome Short: Stick to diet Long; Continue to focus on heart healthy eating             Psychosocial: Target Goals: Acknowledge presence or absence of significant depression and/or stress, maximize coping skills, provide positive support system. Participant is able to verbalize types and ability to use techniques and skills  needed for reducing stress and depression.   Education: Stress, Anxiety, and Depression - Group verbal and visual presentation to define topics covered.  Reviews how body is impacted by stress, anxiety, and depression.  Also discusses healthy ways to reduce stress and to treat/manage anxiety and depression.  Written material given at graduation. Flowsheet Row Pulmonary Rehab from 06/02/2015 in [place]  Date 05/07/15  Educator [facility]  Instruction Review Code (retired) 2- meets goals/outcomes       Education: Sleep Hygiene -Provides group verbal and written instruction about how sleep can affect your health.  Define sleep hygiene, discuss sleep cycles and impact of sleep habits. Review good sleep hygiene tips.    Initial Review & Psychosocial Screening:  Initial Psych Review & Screening - 10/12/22 1309       Initial Review   Current issues with Current Depression;Current Anxiety/Panic;Current Stress Concerns    Source of Stress Concerns Family      Family Dynamics   Good Support System? Yes   wife, sisters   Comments mother has dementia (85years old)      Barriers   Psychosocial barriers to participate in program There are no identifiable barriers or psychosocial needs.;The patient should benefit from training in stress management and relaxation.      Screening Interventions   Interventions Encouraged to exercise;Provide feedback about the scores to participant;To provide support and resources with identified psychosocial needs    Expected Outcomes Short Term goal: Utilizing psychosocial counselor, staff and physician to assist with identification of specific Stressors or current issues interfering with healing process. Setting desired goal for each stressor or current issue identified.;Long Term Goal: Stressors or current issues are controlled or eliminated.;Short Term goal: Identification and review with participant of any Quality of Life or  Depression concerns found by scoring the questionnaire.;Long Term goal: The participant improves quality of Life and PHQ9 Scores as seen by post scores and/or verbalization of changes             Quality of Life Scores:   Quality of Life - 10/20/22 1240       Quality of Life   Select Quality of Life      Quality of Life Scores   Health/Function Pre 13.33 %    Socioeconomic Pre 18.38 %    Psych/Spiritual Pre 20.29 %    Family Pre 16.1 %    GLOBAL Pre 16.27 %            Scores of 19  and below usually indicate a poorer quality of life in these areas.  A difference of  2-3 points is a clinically meaningful difference.  A difference of 2-3 points in the total score of the Quality of Life Index has been associated with significant improvement in overall quality of life, self-image, physical symptoms, and general health in studies assessing change in quality of life.  PHQ-9: Review Flowsheet       11/08/2022 10/20/2022  Depression screen PHQ 2/9  Decreased Interest 0 1  Down, Depressed, Hopeless 0 1  PHQ - 2 Score 0 2  Altered sleeping 1 1  Tired, decreased energy 1 2  Change in appetite 0 0  Feeling bad or failure about yourself  0 2  Trouble concentrating 0 0  Moving slowly or fidgety/restless 0 0  Suicidal thoughts 0 0  PHQ-9 Score 2 7  Difficult doing work/chores Somewhat difficult Somewhat difficult   Interpretation of Total Score  Total Score Depression Severity:  1-4 = Minimal depression, 5-9 = Mild depression, 10-14 = Moderate depression, 15-19 = Moderately severe depression, 20-27 = Severe depression   Psychosocial Evaluation and Intervention:  Psychosocial Evaluation - 10/12/22 1325       Psychosocial Evaluation & Interventions   Interventions Encouraged to exercise with the program and follow exercise prescription;Stress management education;Relaxation education    Comments [name] is coming to cardiac rehab with the diagnosis of stabel angina. He has done  the program before after a stent and CABG. He has noticed his stamina is not like it used to be. He has been taking his medications as he should and is aware he needs to increase his exercise to help improve his breathing. When asked about depression, anxiety, and stress he reported he has been experiencing all three. He is in communication with his doctor regarding these concerns. His main source of stress is caring for his 32 year old mother who has dementia. She lives 2 miles up the road so he is close by. His sisters also help out when they can. He wants to come to the program to boost his stamina and get back on track with exercise.    Expected Outcomes Short: attend cardiac rehab for education and exercise. Long: develop and maintain positive self care habits.    Continue Psychosocial Services  Follow up required by staff             Psychosocial Re-Evaluation:  Psychosocial Re-Evaluation     [place] Name 11/08/22 [id number]             Psychosocial Re-Evaluation   Current issues with Current Stress Concerns       Comments [name] is doing well in rehab.  His PHQ has improved greatly over the last month. He is moving better and no longer gasping like he was earlier.  He is sleeping well for the most part.  His wife and daughter both have COVID currently but he is still doing well.  He continues to care for his mother.  She is currently mad at him for not coming to visit with everyone being sick. He is going to go by today with his mask on.       Expected Outcomes Short: Conitnue to exercise for mental bosot Long: Conitnue to stay positive       Interventions Encouraged to attend Cardiac Rehabilitation for the exercise       Continue Psychosocial Services  Follow up required by staff  Psychosocial Discharge (Final Psychosocial Re-Evaluation):  Psychosocial Re-Evaluation - 11/08/22 0817       Psychosocial Re-Evaluation   Current issues with Current Stress Concerns     Comments [name] is doing well in rehab.  His PHQ has improved greatly over the last month. He is moving better and no longer gasping like he was earlier.  He is sleeping well for the most part.  His wife and daughter both have COVID currently but he is still doing well.  He continues to care for his mother.  She is currently mad at him for not coming to visit with everyone being sick. He is going to go by today with his mask on.    Expected Outcomes Short: Conitnue to exercise for mental bosot Long: Conitnue to stay positive    Interventions Encouraged to attend Cardiac Rehabilitation for the exercise    Continue Psychosocial Services  Follow up required by staff             Vocational Rehabilitation: Provide vocational rehab assistance to qualifying candidates.   Vocational Rehab Evaluation & Intervention:  Vocational Rehab - 10/12/22 1309       Initial Vocational Rehab Evaluation & Intervention   Assessment shows need for Vocational Rehabilitation No             Education: Education Goals: Education classes will be provided on a variety of topics geared toward better understanding of heart health and risk factor modification. Participant will state understanding/return demonstration of topics presented as noted by education test scores.  Learning Barriers/Preferences:  Learning Barriers/Preferences - 10/12/22 1308       Learning Barriers/Preferences   Learning Barriers Hearing    Learning Preferences Individual Instruction             General Cardiac Education Topics:  AED/CPR: - Group verbal and written instruction with the use of models to demonstrate the basic use of the AED with the basic ABC's of resuscitation. Flowsheet Row Pulmonary Rehab from 06/02/2015 in [place]  Date 04/18/15  Educator [place]  Instruction Review Code (retired) 2- Office manager and Cardiac Procedures: - Group verbal and  visual presentation and models provide information about basic cardiac anatomy and function. Reviews the testing methods done to diagnose heart disease and the outcomes of the test results. Describes the treatment choices: Medical Management, Angioplasty, or Coronary Bypass Surgery for treating various heart conditions including Myocardial Infarction, Angina, Valve Disease, and Cardiac Arrhythmias.  Written material given at graduation. Flowsheet Row Cardiac Rehab from 11/17/2022 in [facility] Cardiac and Pulmonary Rehab  Education need identified 10/20/22  Date 10/27/22  Educator [place]  Instruction Review Code 1- Verbalizes Understanding       Medication Safety: - Group verbal and visual instruction to review commonly prescribed medications for heart and lung disease. Reviews the medication, class of the drug, and side effects. Includes the steps to properly store meds and maintain the prescription regimen.  Written material given at graduation. Flowsheet Row Cardiac Rehab from 11/17/2022 in [facility] Cardiac and Pulmonary Rehab  Date 11/03/22  Educator [place]  Instruction Review Code 1- Verbalizes Understanding       Intimacy: - Group verbal instruction through game format to discuss how heart and lung disease can affect sexual intimacy. Written material given at graduation..   Know Your Numbers and Heart Failure: - Group verbal and visual instruction to discuss disease risk factors for cardiac and pulmonary disease and  treatment options.  Reviews associated critical values for Overweight/Obesity, Hypertension, Cholesterol, and Diabetes.  Discusses basics of heart failure: signs/symptoms and treatments.  Introduces Heart Failure Zone chart for action plan for heart failure.  Written material given at graduation. Flowsheet Row Cardiac Rehab from 11/17/2022 in [facility] Cardiac and Pulmonary Rehab  Date 11/10/22  Educator SB  Instruction Review Code 1- Verbalizes Understanding       Infection  Prevention: - Provides verbal and written material to individual with discussion of infection control including proper hand washing and proper equipment cleaning during exercise session. Flowsheet Row Cardiac Rehab from 11/17/2022 in [facility] Cardiac and Pulmonary Rehab  Date 10/20/22  Educator [facility]  Instruction Review Code 1- Verbalizes Understanding       Falls Prevention: - Provides verbal and written material to individual with discussion of falls prevention and safety. Flowsheet Row Cardiac Rehab from 11/17/2022 in [facility] Cardiac and Pulmonary Rehab  Date 10/20/22  Educator [facility]  Instruction Review Code 1- Verbalizes Understanding       Other: -Provides group and verbal instruction on various topics (see comments)   Knowledge Questionnaire Score:  Knowledge Questionnaire Score - 10/20/22 1241       Knowledge Questionnaire Score   Pre Score 23/26             Core Components/Risk Factors/Patient Goals at Admission:  Personal Goals and Risk Factors at Admission - 10/20/22 1241       Core Components/Risk Factors/Patient Goals on Admission    Weight Management Yes    Intervention Weight Management: Develop a combined nutrition and exercise program designed to reach desired caloric intake, while maintaining appropriate intake of nutrient and fiber, sodium and fats, and appropriate energy expenditure required for the weight goal.;Weight Management: Provide education and appropriate resources to help participant work on and attain dietary goals.;Weight Management/Obesity: Establish reasonable short term and long term weight goals.    Admit Weight 252 lb 9.6 oz (114.6 kg)    Goal Weight: Short Term 245 lb (111.1 kg)    Goal Weight: Long Term 220 lb (99.8 kg)    Expected Outcomes Short Term: Continue to assess and modify interventions until short term weight is achieved;Long Term: Adherence to nutrition and physical activity/exercise program aimed toward attainment of established  weight goal;Weight Loss: Understanding of general recommendations for a balanced deficit meal plan, which promotes 1-2 lb weight loss per week and includes a negative energy balance of [phone] kcal/d;Understanding recommendations for meals to include 15-35% energy as protein, 25-35% energy from fat, 35-60% energy from carbohydrates, less than 247m of dietary cholesterol, 20-35 gm of total fiber daily;Understanding of distribution of calorie intake throughout the day with the consumption of 4-5 meals/snacks    Improve shortness of breath with ADL's Yes    Intervention Provide education, individualized exercise plan and daily activity instruction to help decrease symptoms of SOB with activities of daily living.    Expected Outcomes Short Term: Improve cardiorespiratory fitness to achieve a reduction of symptoms when performing ADLs;Long Term: Be able to perform more ADLs without symptoms or delay the onset of symptoms    Increase knowledge of respiratory medications and ability to use respiratory devices properly  Yes    Intervention Provide education and demonstration as needed of appropriate use of medications, inhalers, and oxygen therapy.    Expected Outcomes Short Term: Achieves understanding of medications use. Understands that oxygen is a medication prescribed by physician. Demonstrates appropriate use of inhaler and oxygen therapy.;Long Term: Maintain appropriate  use of medications, inhalers, and oxygen therapy.    Diabetes Yes    Intervention Provide education about signs/symptoms and action to take for hypo/hyperglycemia.;Provide education about proper nutrition, including hydration, and aerobic/resistive exercise prescription along with prescribed medications to achieve blood glucose in normal ranges: Fasting glucose 65-99 mg/dL    Expected Outcomes Short Term: Participant verbalizes understanding of the signs/symptoms and immediate care of hyper/hypoglycemia, proper foot care and importance of  medication, aerobic/resistive exercise and nutrition plan for blood glucose control.;Long Term: Attainment of HbA1C < 7%.    Hypertension Yes    Intervention Provide education on lifestyle modifcations including regular physical activity/exercise, weight management, moderate sodium restriction and increased consumption of fresh fruit, vegetables, and low fat dairy, alcohol moderation, and smoking cessation.;Monitor prescription use compliance.    Expected Outcomes Short Term: Continued assessment and intervention until BP is < 140/80m HG in hypertensive participants. < 130/852mHG in hypertensive participants with diabetes, heart failure or chronic kidney disease.;Long Term: Maintenance of blood pressure at goal levels.    Lipids Yes    Intervention Provide education and support for participant on nutrition & aerobic/resistive exercise along with prescribed medications to achieve LDL <7068mHDL >74m31m  Expected Outcomes Short Term: Participant states understanding of desired cholesterol values and is compliant with medications prescribed. Participant is following exercise prescription and nutrition guidelines.;Long Term: Cholesterol controlled with medications as prescribed, with individualized exercise RX and with personalized nutrition plan. Value goals: LDL < 70mg20mL > 40 mg.             Education:Diabetes - Individual verbal and written instruction to review signs/symptoms of diabetes, desired ranges of glucose level fasting, after meals and with exercise. Acknowledge that pre and post exercise glucose checks will be done for 3 sessions at entry of program. [facility]  Date 10/20/22  [facility]- Verbalizes Understanding       Core Components/Risk Factors/Patient Goals Review:   Goals and Risk Factor Review     Row Name 11/08/22 0822             Core Components/Risk  Factors/Patient Goals Review   Personal Goals Review Weight Management/Obesity;Hypertension;Diabetes;Lipids;Improve shortness of breath with ADL's       Review EddieHeywardoing well in rehab. He is frustrated that his weight is not moving but plans to stick to his diet.  His pressures are doing well and he has been checking them at home.  His blood sugars are staying between 120-130 each day!!  He is also doing more now without the SOB!       Expected Outcomes Short: Continue to exercise for weight loss Long; Conitnue to monitor risk factors                Core Components/Risk Factors/Patient Goals at Discharge (Final Review):   Goals and Risk Factor Review - 11/08/22 0822 [id number]  Core Components/Risk Factors/Patient Goals Review   Personal Goals Review Weight Management/Obesity;Hypertension;Diabetes;Lipids;Improve shortness of breath with ADL's    Review EddieLornoing well in rehab. He is frustrated that his weight is not moving but plans to stick to his diet.  His pressures are doing well and he has been checking them at home.  His blood sugars are staying between 120-130 each day!!  He is also doing more now without the SOB!    Expected Outcomes Short: Continue  to exercise for weight loss Long; Conitnue to monitor risk factors             ITP Comments:  ITP Comments     Row Name 10/12/22 1324 10/20/22 1229 10/25/22 1013 10/26/22 0913 11/17/22 1049   ITP Comments Initial phone call completed. Diagnosis can be found in [facility] 11/14. EP Orientation scheduled for Wednesday 11/29 at 10:30. Completed 6MWT and gym orientation. Initial ITP created and sent for review to Dr. Emily Filbert, Medical Director. First full day of exercise!  Patient was oriented to gym and equipment including functions, settings, policies, and procedures.  Patient's individual exercise prescription and treatment plan were reviewed.  All starting workloads were established based on the results of the 6 minute walk test done  at initial orientation visit.  The plan for exercise progression was also introduced and progression will be customized based on patient's performance and goals. Completed initial RD consultation 30 Day review completed. Medical Director ITP review done, changes made as directed, and signed approval by Medical Director.            Comments:

## 2022-11-19 ENCOUNTER — Encounter: Payer: PPO | Admitting: *Deleted

## 2022-11-19 DIAGNOSIS — I2089 Other forms of angina pectoris: Secondary | ICD-10-CM | POA: Diagnosis not present

## 2022-11-19 NOTE — Progress Notes (Signed)
Daily Session Note  Patient Details  Name: [name] MRN: [id number] Date of Birth: 1956-07-05 Referring Provider:   Flowsheet Row Cardiac Rehab from 10/20/2022 in [facility] Cardiac and Pulmonary Rehab  Referring Provider [place]       Encounter Date: 11/19/2022  Check In:  Session Check In - 11/19/22 0833       Check-In   Supervising physician immediately available to respond to emergencies See telemetry face sheet for immediately available ER MD    Location [facility]-Cardiac & Pulmonary Rehab    Staff Present Heath Lark, RN, BSN, CCRP;Jessica [place], MA, RCEP, CCRP, CCET;Joseph [place], [place]    Virtual Visit No    Medication changes reported     No    Fall or balance concerns reported    No    Warm-up and Cool-down Performed on first and last piece of equipment    Resistance Training Performed Yes    VAD Patient? No    PAD/SET Patient? No      Pain Assessment   Currently in Pain? No/denies                Social History   Tobacco Use  Smoking Status Never  Smokeless Tobacco Never    Goals Met:  Independence with exercise equipment Exercise tolerated well No report of concerns or symptoms today  Goals Unmet:  Not Applicable  Comments: Pt able to follow exercise prescription today without complaint.  Will continue to monitor for progression.    Dr. Emily Filbert is Medical Director for [place].  Dr. Ottie Glazier is Medical Director for [facility] Pulmonary Rehabilitation.

## 2022-11-24 ENCOUNTER — Encounter: Payer: PPO | Attending: Cardiovascular Disease | Admitting: *Deleted

## 2022-11-24 DIAGNOSIS — I2089 Other forms of angina pectoris: Secondary | ICD-10-CM

## 2022-11-24 NOTE — Progress Notes (Signed)
Daily Session Note  Patient Details  Name: CHAYSE ZATARAIN MRN: [id number] Date of Birth: [birth year] Referring Provider:   Flowsheet Row Cardiac Rehab from 10/20/2022 in [facility] Cardiac and Pulmonary Rehab  Referring Provider Gollan       Encounter Date: 11/24/2022  Check In:  Session Check In - 11/24/22 0807       Check-In   Supervising physician immediately available to respond to emergencies See telemetry face sheet for immediately available ER MD    Location [facility]-Cardiac & Pulmonary Rehab    Staff Present Darlyne Russian, RN, Dimple Nanas, BS, Exercise Physiologist;Joseph Tessie [place], [place]    Virtual Visit No    Medication changes reported     No    Fall or balance concerns reported    No    Warm-up and Cool-down Performed on first and last piece of equipment    Resistance Training Performed Yes    VAD Patient? No    PAD/SET Patient? No      Pain Assessment   Currently in Pain? No/denies                Social History   Tobacco Use  Smoking Status Never  Smokeless Tobacco Never    Goals Met:  Independence with exercise equipment Exercise tolerated well No report of concerns or symptoms today Strength training completed today  Goals Unmet:  Not Applicable  Comments: Pt able to follow exercise prescription today without complaint.  Will continue to monitor for progression.    Dr. Emily Filbert is Medical Director for [place].  Dr. Ottie Glazier is Medical Director for [facility] Pulmonary Rehabilitation.

## 2022-11-26 ENCOUNTER — Encounter: Payer: PPO | Admitting: *Deleted

## 2022-11-26 DIAGNOSIS — I2089 Other forms of angina pectoris: Secondary | ICD-10-CM | POA: Diagnosis not present

## 2022-11-26 NOTE — Progress Notes (Signed)
Daily Session Note  Patient Details  Name: [name] MRN: [id number] Date of Birth: Mar 09, 1956 Referring Provider:   Flowsheet Row Cardiac Rehab from 10/20/2022 in [facility] Cardiac and Pulmonary Rehab  Referring Provider Gollan       Encounter Date: 11/26/2022  Check In:  Session Check In - 11/26/22 0837       Check-In   Supervising physician immediately available to respond to emergencies See telemetry face sheet for immediately available ER MD    Location [facility]-Cardiac & Pulmonary Rehab    Staff Present Heath Lark, RN, BSN, CCRP;Jessica [place], MA, RCEP, CCRP, CCET;Joseph [place], [place]    Virtual Visit No    Medication changes reported     No    Fall or balance concerns reported    No    Warm-up and Cool-down Performed on first and last piece of equipment    Resistance Training Performed Yes    VAD Patient? No    PAD/SET Patient? No      Pain Assessment   Currently in Pain? No/denies                Social History   Tobacco Use  Smoking Status Never  Smokeless Tobacco Never    Goals Met:  Independence with exercise equipment Exercise tolerated well No report of concerns or symptoms today  Goals Unmet:  Not Applicable  Comments: Pt able to follow exercise prescription today without complaint.  Will continue to monitor for progression.    Dr. Emily Filbert is Medical Director for [place].  Dr. Ottie Glazier is Medical Director for [facility] Pulmonary Rehabilitation.

## 2022-11-29 ENCOUNTER — Encounter: Payer: PPO | Admitting: *Deleted

## 2022-11-29 DIAGNOSIS — I2089 Other forms of angina pectoris: Secondary | ICD-10-CM

## 2022-11-29 NOTE — Progress Notes (Signed)
Daily Session Note  Patient Details  Name: [name] MRN: [id number] Date of Birth: Apr 25, 1956 Referring Provider:   Flowsheet Row Cardiac Rehab from 10/20/2022 in [facility] Cardiac and Pulmonary Rehab  Referring Provider Gollan       Encounter Date: 11/29/2022  Check In:  Session Check In - 11/29/22 0812       Check-In   Supervising physician immediately available to respond to emergencies See telemetry face sheet for immediately available ER MD    Location [facility]-Cardiac & Pulmonary Rehab    Staff Present Darlyne Russian, RN, Doyce Para, BS, ACSM CEP, Exercise Physiologist;Joseph Tessie Fass, [place]    Virtual Visit No    Medication changes reported     No    Fall or balance concerns reported    No    Warm-up and Cool-down Performed on first and last piece of equipment    Resistance Training Performed Yes    VAD Patient? No    PAD/SET Patient? No      Pain Assessment   Currently in Pain? No/denies                Social History   Tobacco Use  Smoking Status Never  Smokeless Tobacco Never    Goals Met:  Independence with exercise equipment Exercise tolerated well No report of concerns or symptoms today Strength training completed today  Goals Unmet:  Not Applicable  Comments: Pt able to follow exercise prescription today without complaint.  Will continue to monitor for progression.    Dr. Emily Filbert is Medical Director for [place].  Dr. Ottie Glazier is Medical Director for [facility] Pulmonary Rehabilitation.

## 2022-12-01 ENCOUNTER — Encounter: Payer: PPO | Admitting: *Deleted

## 2022-12-01 DIAGNOSIS — I2089 Other forms of angina pectoris: Secondary | ICD-10-CM | POA: Diagnosis not present

## 2022-12-01 NOTE — Progress Notes (Signed)
Daily Session Note  Patient Details  Name: [name] MRN: [id number] Date of Birth: 1956-07-04 Referring Provider:   Flowsheet Row Cardiac Rehab from 10/20/2022 in [facility] Cardiac and Pulmonary Rehab  Referring Provider Gollan       Encounter Date: 12/01/2022  Check In:  Session Check In - 12/01/22 0801       Check-In   Supervising physician immediately available to respond to emergencies See telemetry face sheet for immediately available ER MD    Location [facility]-Cardiac & Pulmonary Rehab    Staff Present Darlyne Russian, RN, ADN;Joseph Tessie Fass, RCP,RRT,BSRT;Noah Tickle, BS, Exercise Physiologist    Virtual Visit No    Medication changes reported     No    Fall or balance concerns reported    No    Warm-up and Cool-down Performed on first and last piece of equipment    Resistance Training Performed Yes    VAD Patient? No    PAD/SET Patient? No      Pain Assessment   Currently in Pain? No/denies                Social History   Tobacco Use  Smoking Status Never  Smokeless Tobacco Never    Goals Met:  Independence with exercise equipment Exercise tolerated well No report of concerns or symptoms today Strength training completed today  Goals Unmet:  Not Applicable  Comments: Pt able to follow exercise prescription today without complaint.  Will continue to monitor for progression.    Dr. Emily Filbert is Medical Director for [place].  Dr. Ottie Glazier is Medical Director for [facility] Pulmonary Rehabilitation.

## 2022-12-03 ENCOUNTER — Encounter: Payer: PPO | Admitting: *Deleted

## 2022-12-03 ENCOUNTER — Telehealth: Payer: Self-pay | Admitting: Cardiovascular Disease

## 2022-12-03 DIAGNOSIS — I2089 Other forms of angina pectoris: Secondary | ICD-10-CM

## 2022-12-03 NOTE — Telephone Encounter (Signed)
Patient stated he has not been having success with his weight loss regimen and is following up on getting a shot as discussed with Dr. Rockey Situ.

## 2022-12-03 NOTE — Progress Notes (Signed)
Daily Session Note  Patient Details  Name: [name] MRN: [id number] Date of Birth: 10-06-1956 Referring Provider:   Flowsheet Row Cardiac Rehab from 10/20/2022 in [facility] Cardiac and Pulmonary Rehab  Referring Provider Gollan       Encounter Date: 12/03/2022  Check In:  Session Check In - 12/03/22 0836       Check-In   Supervising physician immediately available to respond to emergencies See telemetry face sheet for immediately available ER MD    Location [facility]-Cardiac & Pulmonary Rehab    Staff Present Heath Lark, RN, BSN, CCRP;Jessica Hammond, MA, RCEP, CCRP, CCET;Joseph Oppelo, [place]    Virtual Visit No    Medication changes reported     No    Fall or balance concerns reported    No    Warm-up and Cool-down Performed on first and last piece of equipment    Resistance Training Performed Yes    VAD Patient? No    PAD/SET Patient? No      Pain Assessment   Currently in Pain? No/denies                Social History   Tobacco Use  Smoking Status Never  Smokeless Tobacco Never    Goals Met:  Independence with exercise equipment Exercise tolerated well No report of concerns or symptoms today  Goals Unmet:  Not Applicable  Comments: Pt able to follow exercise prescription today without complaint.  Will continue to monitor for progression.    Dr. Emily Filbert is Medical Director for [place].  Dr. Ottie Glazier is Medical Director for [facility] Pulmonary Rehabilitation.

## 2022-12-06 ENCOUNTER — Encounter: Payer: PPO | Admitting: *Deleted

## 2022-12-06 DIAGNOSIS — I2089 Other forms of angina pectoris: Secondary | ICD-10-CM | POA: Diagnosis not present

## 2022-12-06 NOTE — Progress Notes (Signed)
Daily Session Note  Patient Details  Name: [name] MRN: [id number] Date of Birth: 05-20-1956 Referring Provider:   Flowsheet Row Cardiac Rehab from 10/20/2022 in [facility] Cardiac and Pulmonary Rehab  Referring Provider Gollan       Encounter Date: 12/06/2022  Check In:  Session Check In - 12/06/22 0757       Check-In   Supervising physician immediately available to respond to emergencies See telemetry face sheet for immediately available ER MD    Location [facility]-Cardiac & Pulmonary Rehab    Staff Present Darlyne Russian, RN, ADN;Joseph Tessie Fass, RCP,RRT,BSRT;Kelly Amedeo Plenty, BS, ACSM CEP, Exercise Physiologist    Virtual Visit No    Medication changes reported     No    Fall or balance concerns reported    No    Warm-up and Cool-down Performed on first and last piece of equipment    Resistance Training Performed Yes    VAD Patient? No    PAD/SET Patient? No      Pain Assessment   Currently in Pain? No/denies                Social History   Tobacco Use  Smoking Status Never  Smokeless Tobacco Never    Goals Met:  Independence with exercise equipment Exercise tolerated well No report of concerns or symptoms today Strength training completed today  Goals Unmet:  Not Applicable  Comments: Pt able to follow exercise prescription today without complaint.  Will continue to monitor for progression. Messiah states that he felt his right knee pop. He was walking on the treadmill and felt it. He immediatly stopped. He feels at this time he should go home, elevate and ice it. Informed patient to call if it is any worse over the next day or two. Patient also informed not to come to rehab if it he is to much pain.     Dr. Emily Filbert is Medical Director for [place].  Dr. Ottie Glazier is Medical Director for [facility] Pulmonary Rehabilitation.

## 2022-12-06 NOTE — Telephone Encounter (Signed)
Patient has been made aware and verbalized his understanding.   Would recommend he ask Dr. Lovie Macadamia if a medication such as Ozempic or Darcel Bayley would fit with his diabetes regiment If it does fit, would see if Dr. Lovie Macadamia would write a prescription If Dr. Lovie Macadamia feels that is a good option for him but does not want to write a prescription, would suggest he document that in the computer, we will be able to read it and we can certainly start the medication Thx TGollan

## 2022-12-08 ENCOUNTER — Encounter: Payer: PPO | Admitting: *Deleted

## 2022-12-08 DIAGNOSIS — I2089 Other forms of angina pectoris: Secondary | ICD-10-CM | POA: Diagnosis not present

## 2022-12-08 NOTE — Progress Notes (Signed)
Daily Session Note  Patient Details  Name: [name] MRN: [id number] Date of Birth: 1956/01/02 Referring Provider:   Flowsheet Row Cardiac Rehab from 10/20/2022 in [facility] Cardiac and Pulmonary Rehab  Referring Provider Gollan       Encounter Date: 12/08/2022  Check In:  Session Check In - 12/08/22 0734       Check-In   Supervising physician immediately available to respond to emergencies See telemetry face sheet for immediately available ER MD    Location [facility]-Cardiac & Pulmonary Rehab    Staff Present Darlyne Russian, RN, Dimple Nanas, BS, Exercise Physiologist;Joseph Tessie Fass, [place]    Virtual Visit No    Medication changes reported     No    Fall or balance concerns reported    No    Warm-up and Cool-down Performed on first and last piece of equipment    Resistance Training Performed Yes    VAD Patient? No    PAD/SET Patient? No      Pain Assessment   Currently in Pain? No/denies                Social History   Tobacco Use  Smoking Status Never  Smokeless Tobacco Never    Goals Met:  Independence with exercise equipment Exercise tolerated well No report of concerns or symptoms today Strength training completed today  Goals Unmet:  Not Applicable  Comments: Pt able to follow exercise prescription today without complaint.  Will continue to monitor for progression.    Dr. Emily Filbert is Medical Director for [place].  Dr. Ottie Glazier is Medical Director for [facility] Pulmonary Rehabilitation.

## 2022-12-10 ENCOUNTER — Encounter: Payer: PPO | Admitting: *Deleted

## 2022-12-10 DIAGNOSIS — I2089 Other forms of angina pectoris: Secondary | ICD-10-CM | POA: Diagnosis not present

## 2022-12-10 NOTE — Progress Notes (Signed)
Daily Session Note  Patient Details  Name: [name] MRN: [id number] Date of Birth: Mar 26, 1956 Referring Provider:   Flowsheet Row Cardiac Rehab from 10/20/2022 in [facility] Cardiac and Pulmonary Rehab  Referring Provider Gollan       Encounter Date: 12/10/2022  Check In:  Session Check In - 12/10/22 0835       Check-In   Supervising physician immediately available to respond to emergencies See telemetry face sheet for immediately available ER MD    Location [facility]-Cardiac & Pulmonary Rehab    Staff Present Heath Lark, RN, BSN, CCRP;Marvell Fuller, PhD, RN, CNS, CEN;Joseph Tessie Fass, [place]    Virtual Visit No    Medication changes reported     No    Fall or balance concerns reported    No    Warm-up and Cool-down Performed on first and last piece of equipment    Resistance Training Performed Yes    VAD Patient? No    PAD/SET Patient? No      Pain Assessment   Currently in Pain? No/denies                Social History   Tobacco Use  Smoking Status Never  Smokeless Tobacco Never    Goals Met:  Independence with exercise equipment Exercise tolerated well No report of concerns or symptoms today  Goals Unmet:  Not Applicable  Comments: Pt able to follow exercise prescription today without complaint.  Will continue to monitor for progression. [name] states his angina symptoms are improved and that he can go up flight of stairs without getting short of breath   Dr. Emily Filbert is Medical Director for [place].  Dr. Ottie Glazier is Medical Director for [facility] Pulmonary Rehabilitation.

## 2022-12-13 ENCOUNTER — Encounter: Payer: PPO | Admitting: *Deleted

## 2022-12-13 DIAGNOSIS — I2089 Other forms of angina pectoris: Secondary | ICD-10-CM | POA: Diagnosis not present

## 2022-12-13 NOTE — Progress Notes (Signed)
Daily Session Note  Patient Details  Name: [name] MRN: [id number] Date of Birth: Jul 30, 1956 Referring Provider:   Flowsheet Row Cardiac Rehab from 10/20/2022 in [facility] Cardiac and Pulmonary Rehab  Referring Provider Gollan       Encounter Date: 12/13/2022  Check In:  Session Check In - 12/13/22 0834       Check-In   Supervising physician immediately available to respond to emergencies See telemetry face sheet for immediately available ER MD    Location [facility]-Cardiac & Pulmonary Rehab    Staff Present Darlyne Russian, RN, Doyce Para, BS, ACSM CEP, Exercise Physiologist;Joseph Tessie Fass, [place]    Virtual Visit No    Medication changes reported     No    Fall or balance concerns reported    No    Warm-up and Cool-down Performed on first and last piece of equipment    Resistance Training Performed Yes    VAD Patient? No    PAD/SET Patient? No      Pain Assessment   Currently in Pain? No/denies                Social History   Tobacco Use  Smoking Status Never  Smokeless Tobacco Never    Goals Met:  Independence with exercise equipment Exercise tolerated well No report of concerns or symptoms today Strength training completed today  Goals Unmet:  Not Applicable  Comments: Pt able to follow exercise prescription today without complaint.  Will continue to monitor for progression.    Dr. Emily Filbert is Medical Director for [place].  Dr. Ottie Glazier is Medical Director for [facility] Pulmonary Rehabilitation.

## 2022-12-15 ENCOUNTER — Encounter: Payer: PPO | Admitting: *Deleted

## 2022-12-15 ENCOUNTER — Encounter: Payer: Self-pay | Admitting: *Deleted

## 2022-12-15 DIAGNOSIS — I2089 Other forms of angina pectoris: Secondary | ICD-10-CM

## 2022-12-15 NOTE — Progress Notes (Signed)
Daily Session Note  Patient Details  Name: [name] MRN: [id number] Date of Birth: 06-21-1956 Referring Provider:   Flowsheet Row Cardiac Rehab from 10/20/2022 in [facility] Cardiac and Pulmonary Rehab  Referring Provider Gollan       Encounter Date: 12/15/2022  Check In:  Session Check In - 12/15/22 0801       Check-In   Supervising physician immediately available to respond to emergencies See telemetry face sheet for immediately available ER MD    Location [facility]-Cardiac & Pulmonary Rehab    Staff Present Darlyne Russian, RN, ADN;Joseph Tessie Fass, RCP,RRT,BSRT;Noah Tickle, BS, Exercise Physiologist    Virtual Visit No    Medication changes reported     No    Fall or balance concerns reported    No    Warm-up and Cool-down Performed on first and last piece of equipment    Resistance Training Performed Yes    VAD Patient? No    PAD/SET Patient? No      Pain Assessment   Currently in Pain? No/denies                Social History   Tobacco Use  Smoking Status Never  Smokeless Tobacco Never    Goals Met:  Independence with exercise equipment Exercise tolerated well No report of concerns or symptoms today Strength training completed today  Goals Unmet:  Not Applicable  Comments: Pt able to follow exercise prescription today without complaint.  Will continue to monitor for progression.    Dr. Emily Filbert is Medical Director for [place].  Dr. Ottie Glazier is Medical Director for [facility] Pulmonary Rehabilitation.

## 2022-12-15 NOTE — Progress Notes (Signed)
Cardiac Individual Treatment Plan  Patient Details  Name: DEYVI BONANNO MRN: [id number] Date of Birth: [birth year] Referring Provider:   Flowsheet Row Cardiac Rehab from 10/20/2022 in [facility] Cardiac and Pulmonary Rehab  Referring Provider Gollan       Initial Encounter Date:  Flowsheet Row Cardiac Rehab from 10/20/2022 in [facility] Cardiac and Pulmonary Rehab  Date 10/20/22       Visit Diagnosis: Chronic stable angina  Patient's Home Medications on Admission:  Current Outpatient Medications:    acetaminophen (TYLENOL) 325 MG tablet, Take 650 mg by mouth every 6 (six) hours as needed for moderate pain or headache. , Disp: , Rfl:    albuterol (PROVENTIL HFA;VENTOLIN HFA) 108 (90 BASE) MCG/ACT inhaler, Inhale 1-2 puffs into the lungs every 6 (six) hours as needed for wheezing or shortness of breath. , Disp: , Rfl:    alfuzosin (UROXATRAL) 10 MG 24 hr tablet, Take 10 mg by mouth daily., Disp: , Rfl:    Amphetamine-Dextroamphetamine (ADDERALL PO), Take by mouth as needed., Disp: , Rfl:    ASPIRIN 81 PO, Take by mouth daily., Disp: , Rfl:    atorvastatin (LIPITOR) 40 MG tablet, Take 40 mg by mouth daily. , Disp: , Rfl:    cetirizine (ZYRTEC) 10 MG tablet, Take 10 mg by mouth daily as needed for allergies. , Disp: , Rfl:    diphenhydrAMINE (BENADRYL) 50 MG capsule, Take 50 mg by mouth at bedtime as needed for sleep., Disp: , Rfl:    ezetimibe (ZETIA) 10 MG tablet, TAKE 1 TABLET BY MOUTH EVERY DAY, Disp: 90 tablet, Rfl: 2   fluticasone (FLONASE) 50 MCG/ACT nasal spray, Place into the nose., Disp: , Rfl:    fluticasone-salmeterol (ADVAIR HFA) 115-21 MCG/ACT inhaler, Inhale 2 puffs into the lungs 2 (two) times daily., Disp: 1 Inhaler, Rfl: 6   losartan (COZAAR) 50 MG tablet, Take 50 mg by mouth daily., Disp: , Rfl:    metFORMIN (GLUCOPHAGE) 850 MG tablet, Take 850 mg by mouth daily with breakfast. , Disp: , Rfl:    metoprolol succinate (TOPROL-XL) 25 MG 24 hr tablet, Take by mouth., Disp: , Rfl:     mirabegron ER (MYRBETRIQ) 25 MG TB24 tablet, , Disp: , Rfl:    nitroGLYCERIN (NITROSTAT) 0.4 MG SL tablet, Place 1 tablet (0.4 mg total) under the tongue every 5 (five) minutes as needed for chest pain., Disp: 25 tablet, Rfl: 3   RABEprazole (ACIPHEX) 20 MG tablet, Take 20 mg by mouth daily. , Disp: , Rfl:    TRADJENTA 5 MG TABS tablet, Take 5 mg by mouth daily., Disp: , Rfl:    traZODone (DESYREL) 100 MG tablet, TAKE 1/2 TO 1 TABLET BY MOUTH NIGHTLY., Disp: , Rfl:    venlafaxine XR (EFFEXOR-XR) 75 MG 24 hr capsule, Take 225 mg by mouth daily., Disp: , Rfl:   Past Medical History: Past Medical History:  Diagnosis Date   Anxiety    Atherosclerosis of coronary artery bypass graft with angina pectoris (HCC)    Barrett's esophagus    Benign enlargement of prostate    Chronic constipation    Colon polyp    Coronary artery disease    COVID-19    1/22 3/22   Depression    GERD (gastroesophageal reflux disease)    H/O esophageal spasm    Hyperlipidemia    Hypertension    MI (myocardial infarction) ([place]) 2011 & 2014   x 2   Post-COVID chronic cough    Sleep apnea  after weight loss no need for CPAP   TIA (transient ischemic attack)    "many yrs ago" - no deficits   Type 2 diabetes mellitus (HCC)    Wears hearing aid in both ears     Tobacco Use: Social History   Tobacco Use  Smoking Status Never  Smokeless Tobacco Never    Labs: Review Flowsheet       Latest Ref Rng & Units 04/22/2009 11/18/2009 11/19/2009 11/02/2018  Labs for ITP Cardiac and Pulmonary Rehab  Cholestrol 0 - 200 mg/dL - - 161114        ATP III CLASSIFICATION:  <200     mg/dL   Desirable  [id number]  mg/dL   Borderline High  >=409>=240    mg/dL   High         -  LDL (calc) 0 - 99 mg/dL - - 70        Total Cholesterol/HDL:CHD Risk Coronary Heart Disease Risk Table                     Men   Women  1/2 Average Risk   3.4   3.3  Average Risk       5.0   4.4  2 X Average Risk   9.6   7.1  3 X Average Risk   23.4   11.0        Use the calculated Patient Ratio above and the CHD Risk Table to determine the patient's CHD Risk.        ATP III CLASSIFICATION (LDL):  <100     mg/dL   Optimal  [id number]  mg/dL   Near or Above                    Optimal  130-159  mg/dL   Borderline  [id number]  mg/dL   High  >213>190     mg/dL   Very High  -  HDL-C >08>39 mg/dL - - 29  -  Trlycerides <150 mg/dL - - 75  -  Hemoglobin M5HA1c 4.8 - 5.6 % - 5.7 (NOTE) The ADA recommends the following therapeutic goal for glycemic control related to Hgb A1c measurement: Goal of therapy: <6.5 Hgb A1c  Reference: American Diabetes Association: Clinical Practice Recommendations 2010, Diabetes Care, 2010, 33: (Suppl  1).  - 4.8   TCO2 0 - 100 mmol/L 21  - - -     Exercise Target Goals: Exercise Program Goal: Individual exercise prescription set using results from initial 6 min walk test and THRR while considering  patient's activity barriers and safety.   Exercise Prescription Goal: Initial exercise prescription builds to 30-45 minutes a day of aerobic activity, 2-3 days per week.  Home exercise guidelines will be given to patient during program as part of exercise prescription that the participant will acknowledge.   Education: Aerobic Exercise: - Group verbal and visual presentation on the components of exercise prescription. Introduces F.I.T.T principle from ACSM for exercise prescriptions.  Reviews F.I.T.T. principles of aerobic exercise including progression. Written material given at graduation. Flowsheet Row [facility]  Education need identified 10/20/22  Date 12/08/22  Educator [facility]  Instruction Review Code 1- Verbalizes Understanding       Education: Resistance Exercise: - Group verbal and visual presentation on the components of exercise prescription. Introduces F.I.T.T principle from ACSM for exercise prescriptions  Reviews F.I.T.T. principles of resistance  exercise including progression. Written  material given at graduation. Flowsheet Row Cardiac Rehab from 12/15/2022 in [facility] Cardiac and Pulmonary Rehab  Date 12/15/22  Educator [facility]  Instruction Review Code 1- Verbalizes Understanding        Education: Exercise & Equipment Safety: - Individual verbal instruction and demonstration of equipment use and safety with use of the equipment. Flowsheet Row Cardiac Rehab from 12/15/2022 in [facility] Cardiac and Pulmonary Rehab  Date 10/20/22  Educator [facility]  Instruction Review Code 1- Verbalizes Understanding       Education: Exercise Physiology & General Exercise Guidelines: - Group verbal and written instruction with models to review the exercise physiology of the cardiovascular system and associated critical values. Provides general exercise guidelines with specific guidelines to those with heart or lung disease.  Flowsheet Row Cardiac Rehab from 12/15/2022 in [facility] Cardiac and Pulmonary Rehab  Date 12/01/22  Educator [facility]  Instruction Review Code 1- Verbalizes Understanding       Education: Flexibility, Balance, Mind/Body Relaxation: - Group verbal and visual presentation with interactive activity on the components of exercise prescription. Introduces F.I.T.T principle from ACSM for exercise prescriptions. Reviews F.I.T.T. principles of flexibility and balance exercise training including progression. Also discusses the mind body connection.  Reviews various relaxation techniques to help reduce and manage stress (i.e. Deep breathing, progressive muscle relaxation, and visualization). Balance handout provided to take home. Written material given at graduation. Flowsheet Row Cardiac Rehab from 12/15/2022 in [facility] Cardiac and Pulmonary Rehab  Date 12/15/22  Educator [facility]  Instruction Review Code 1- Verbalizes Understanding       Activity Barriers & Risk Stratification:  Activity Barriers & Cardiac Risk Stratification - 10/20/22 1232       Activity  Barriers & Cardiac Risk Stratification   Activity Barriers Left Knee Replacement;Right Knee Replacement    Cardiac Risk Stratification High             6 Minute Walk:  6 Minute Walk     Row Name 10/20/22 1229         6 Minute Walk   Phase Initial     Distance 1430 feet     Walk Time 6 minutes     # of Rest Breaks 0     MPH 2.71     METS 3.17     RPE 17     Perceived Dyspnea  3     VO2 Peak 11.11     Symptoms No     Resting HR 74 bpm     Resting BP 122/80     Resting Oxygen Saturation  99 %     Exercise Oxygen Saturation  during 6 min walk 99 %     Max Ex. HR 118 bpm     Max Ex. BP 160/72     2 Minute Post BP 128/82              Oxygen Initial Assessment:   Oxygen Re-Evaluation:   Oxygen Discharge (Final Oxygen Re-Evaluation):   Initial Exercise Prescription:  Initial Exercise Prescription - 10/20/22 1200       Date of Initial Exercise RX and Referring Provider   Date 10/20/22    Referring Provider Gollan      Oxygen   Maintain Oxygen Saturation 88% or higher      Treadmill   MPH 2.3    Grade 1    Minutes 15    METs 3.08      Arm Ergometer   Level 1    RPM 30  Minutes 15    METs 3.17      REL-XR   Level 2    Speed 50    Minutes 15    METs 3.17      Track   Laps 30    Minutes 15    METs 2.63      Prescription Details   Frequency (times per week) 3    Duration Progress to 30 minutes of continuous aerobic without signs/symptoms of physical distress      Intensity   THRR 40-80% of Max Heartrate 105-136    Ratings of Perceived Exertion 11-13    Perceived Dyspnea 0-4      Progression   Progression Continue to progress workloads to maintain intensity without signs/symptoms of physical distress.      Resistance Training   Training Prescription Yes    Weight 5    Reps 10-15             Perform Capillary Blood Glucose checks as needed.  Exercise Prescription Changes:   Exercise Prescription Changes     Row Name  10/20/22 1200 11/01/22 1500 11/08/22 0800 11/16/22 1400 11/29/22 1100     Response to Exercise   Blood Pressure (Admit) 122/80 118/62 -- 124/70 124/72   Blood Pressure (Exercise) 160/72 132/74 -- 138/68 --   Blood Pressure (Exit) 120/70 124/70 -- 112/66 126/66   Heart Rate (Admit) 74 bpm 86 bpm -- 87 bpm 70 bpm   Heart Rate (Exercise) 118 bpm 117 bpm -- 127 bpm 123 bpm   Heart Rate (Exit) 91 bpm 82 bpm -- 84 bpm 76 bpm   Oxygen Saturation (Exercise) 99 % -- -- -- --   Oxygen Saturation (Exit) 99 % -- -- -- --   Rating of Perceived Exertion (Exercise) 99 13 -- 14 15   Perceived Dyspnea (Exercise) 3 -- -- -- --   Symptoms none none -- none none   Comments results 3rd full session of exercise -- -- --   Duration -- Progress to 30 minutes of  aerobic without signs/symptoms of physical distress -- Continue with 30 min of aerobic exercise without signs/symptoms of physical distress. Continue with 30 min of aerobic exercise without signs/symptoms of physical distress.   Intensity -- THRR unchanged -- THRR unchanged THRR unchanged     Progression   Progression -- Continue to progress workloads to maintain intensity without signs/symptoms of physical distress. -- Continue to progress workloads to maintain intensity without signs/symptoms of physical distress. Continue to progress workloads to maintain intensity without signs/symptoms of physical distress.   Average METs -- 3.55 -- 3.46 4.09     Resistance Training   Training Prescription -- Yes -- Yes Yes   Weight -- 5 lb -- 5 lb 5 lb   Reps -- 10-15 -- 10-15 10-15     Interval Training   Interval Training -- No -- No No     Treadmill   MPH -- 2.3 -- 2.3 3   Grade -- 1 -- 1.5 2   Minutes -- 15 -- 15 15   METs -- 3.08 -- 3.23 4.12     NuStep   Level -- -- -- -- 3   Minutes -- -- -- -- 15   METs -- -- -- -- 5.4     Arm Ergometer   Level -- -- -- 1 --   Minutes -- -- -- 15 --   METs -- -- -- 1 --     REL-XR  Level -- 9 -- 7 7    Minutes -- 15 -- 15 15   METs -- 3.2 -- 5 5.2     Biostep-RELP   Level -- 5 -- 4 8   Minutes -- 15 -- 15 15   METs -- 4 -- 4 4     Track   Laps -- 9 -- -- 43   Minutes -- 15 -- -- 15   METs -- 1.49 -- -- 3.36     Home Exercise Plan   Plans to continue exercise at -- -- [employer] (comment)  [employer], [employer], walking [employer] (comment)  [employer], [employer], walking [employer] (comment)  [employer], [employer], walking   Frequency -- -- Add 2 additional days to program exercise sessions. Add 2 additional days to program exercise sessions. Add 2 additional days to program exercise sessions.   Initial Home Exercises Provided -- -- 11/08/22 11/08/22 11/08/22     Oxygen   Maintain Oxygen Saturation -- -- -- 88% or higher 88% or higher    Row Name 12/13/22 1700             Response to Exercise   Blood Pressure (Admit) 120/62       Blood Pressure (Exit) 112/60       Heart Rate (Admit) 72 bpm       Heart Rate (Exercise) 105 bpm       Heart Rate (Exit) 80 bpm       Rating of Perceived Exertion (Exercise) 15       Symptoms none       Duration Continue with 30 min of aerobic exercise without signs/symptoms of physical distress.       Intensity THRR unchanged         Progression   Progression Continue to progress workloads to maintain intensity without signs/symptoms of physical distress.       Average METs 5.18         Resistance Training   Training Prescription Yes       Weight 5 lb       Reps 10-15         Interval Training   Interval Training No         Treadmill   MPH 2.5       Grade 2       Minutes 15       METs 3.6         Recumbant Bike   Level 3       Watts 25       Minutes 15       METs 4.6         NuStep   Level 5       Minutes 15       METs 5.9         REL-XR   Level 8       Minutes 15       METs 7         Biostep-RELP   Level 3       Minutes 15       METs 4         Home Exercise Plan   Plans to continue exercise at  [employer] (comment)  [employer], [employer], walking       Frequency Add 2 additional days to program exercise sessions.       Initial Home Exercises Provided 11/08/22  Oxygen   Maintain Oxygen Saturation 88% or higher                Exercise Comments:   Exercise Comments     Row Name 10/25/22 1013 12/10/22 0837         Exercise Comments First full day of exercise!  Patient was oriented to gym and equipment including functions, settings, policies, and procedures.  Patient's individual exercise prescription and treatment plan were reviewed.  All starting workloads were established based on the results of the 6 minute walk test done at initial orientation visit.  The plan for exercise progression was also introduced and progression will be customized based on patient's performance and goals. Joshus states his angina symptoms are improved and that he can go up flight of stairs without getting short of breath               Exercise Goals and Review:   Exercise Goals     Row Name 10/20/22 1238             Exercise Goals   Increase Physical Activity Yes       Intervention Provide advice, education, support and counseling about physical activity/exercise needs.;Develop an individualized exercise prescription for aerobic and resistive training based on initial evaluation findings, risk stratification, comorbidities and participant's personal goals.       Expected Outcomes Short Term: Attend rehab on a regular basis to increase amount of physical activity.;Long Term: Add in home exercise to make exercise part of routine and to increase amount of physical activity.;Long Term: Exercising regularly at least 3-5 days a week.       Increase Strength and Stamina Yes       Intervention Provide advice, education, support and counseling about physical activity/exercise needs.;Develop an individualized exercise prescription for aerobic and resistive training based on initial  evaluation findings, risk stratification, comorbidities and participant's personal goals.       Expected Outcomes Short Term: Increase workloads from initial exercise prescription for resistance, speed, and METs.;Short Term: Perform resistance training exercises routinely during rehab and add in resistance training at home;Long Term: Improve cardiorespiratory fitness, muscular endurance and strength as measured by increased METs and functional capacity (6MWT)       Able to understand and use rate of perceived exertion (RPE) scale Yes       Intervention Provide education and explanation on how to use RPE scale       Expected Outcomes Short Term: Able to use RPE daily in rehab to express subjective intensity level;Long Term:  Able to use RPE to guide intensity level when exercising independently       Able to understand and use Dyspnea scale Yes       Intervention Provide education and explanation on how to use Dyspnea scale       Expected Outcomes Short Term: Able to use Dyspnea scale daily in rehab to express subjective sense of shortness of breath during exertion;Long Term: Able to use Dyspnea scale to guide intensity level when exercising independently       Knowledge and understanding of Target Heart Rate Range (THRR) Yes       Intervention Provide education and explanation of THRR including how the numbers were predicted and where they are located for reference       Expected Outcomes Short Term: Able to state/look up THRR;Long Term: Able to use THRR to govern intensity when exercising independently;Short Term: Able to use daily as guideline for intensity  in rehab       Able to check pulse independently Yes       Intervention Provide education and demonstration on how to check pulse in carotid and radial arteries.;Review the importance of being able to check your own pulse for safety during independent exercise       Expected Outcomes Short Term: Able to explain why pulse checking is important  during independent exercise       Understanding of Exercise Prescription Yes       Intervention Provide education, explanation, and written materials on patient's individual exercise prescription       Expected Outcomes Short Term: Able to explain program exercise prescription;Long Term: Able to explain home exercise prescription to exercise independently                Exercise Goals Re-Evaluation :  Exercise Goals Re-Evaluation     Row Name 10/25/22 1013 11/01/22 1516 11/08/22 0823 11/16/22 1448 11/24/22 0754     Exercise Goal Re-Evaluation   Exercise Goals Review Able to understand and use rate of perceived exertion (RPE) scale;Able to understand and use Dyspnea scale;Knowledge and understanding of Target Heart Rate Range (THRR);Understanding of Exercise Prescription Increase Physical Activity;Increase Strength and Stamina;Understanding of Exercise Prescription Increase Physical Activity;Increase Strength and Stamina;Understanding of Exercise Prescription;Able to understand and use rate of perceived exertion (RPE) scale;Knowledge and understanding of Target Heart Rate Range (THRR);Able to check pulse independently Increase Physical Activity;Increase Strength and Stamina;Understanding of Exercise Prescription Increase Physical Activity;Increase Strength and Stamina;Understanding of Exercise Prescription   Comments Reviewed RPE scale, THR and program prescription with pt today.  Pt voiced understanding and was given a copy of goals to take home. Javante is off to a good start with rehab. He has already increased to fairly high workloads. He is at level 9 on the XR and level 5 on the Biostep. He, however, is limited with walking and was only able to walk 5 minutes on the treadmill and walked 9 laps on the track. We hope to see his walking ability increase over the time of the program. Will continue to monitor as he progresses. Reviewed home exercise with pt today.  Pt plans to walk and go to [facility] and  [employer] for exercise.  Reviewed THR, pulse, RPE, sign and symptoms, pulse oximetery and when to call 911 or MD.  Also discussed weather considerations and indoor options.  Pt voiced understanding. Jan continues to do well in rehab. He had an overall average MET level of 3.46 METs over the past two weeks. He was also able to add a little more incline on the treadmill up to 1.5%, while maintaining a speed of 2.3 mph. He has tolerated 5 lb hand weights for resistance training as well. We will continue to monitor his progress in the program. Yashas is doing well in rehab.  He was at the gym last night!  He is aiming to get there at least 1 extra day a week for three a week total.  He is aiming for 22 min on treadmill at 2.4 mph and then on bike for 15 min and then cool down.  He is feeling like his stamina is starting to improve.   Expected Outcomes Short: Use RPE daily to regulate intensity. Long: Follow program prescription in THR. Short: Continue to increase walking duration on both track and treadmill Long: Increase overall MET level Short: Use guidelines to for intensity during exercise Long: conitnue to improve stamina Short: Continue to  increase workloads on seated machines. Long: conitnue to improve strength and stamina. Short; Conitnue to get to gym on off days Long: Conitnue to improve stamina    Row Name 11/29/22 1200 12/13/22 1755           Exercise Goal Re-Evaluation   Exercise Goals Review Increase Physical Activity;Increase Strength and Stamina;Understanding of Exercise Prescription Increase Physical Activity;Increase Strength and Stamina;Understanding of Exercise Prescription      Comments Harrol continues to do well in rehab. He walked a max of 43 laps on the track! He also increase his workload on the treadmill significantly to a 3 mph/ 2% incline. biostep went up to a level 8. His overall METs increased and he has been hitting his THR most sessions. He could benefit from interval  training. Will continue to monitor. Jerre continues to do well in rehab. He recently increased his overall average MET level to 5.18 METs. He also was able to improve to level 5 on the T4 and level 8 on the XR. He has been inconsistent with his workload on the treadmill, as he was walking at a speed of 3 mph but came down to 2.5 mph. We will continue to monitor his progress in the program.      Expected Outcomes Short: Continue to increase overall laps on track, staff go over interval patient with patient Long: continue to increase overall MET level Short: Keep consistently increasing treadmill workload. Long: Continue to improve strength and stamina.               Discharge Exercise Prescription (Final Exercise Prescription Changes):  Exercise Prescription Changes - 12/13/22 1700       Response to Exercise   Blood Pressure (Admit) 120/62    Blood Pressure (Exit) 112/60    Heart Rate (Admit) 72 bpm    Heart Rate (Exercise) 105 bpm    Heart Rate (Exit) 80 bpm    Rating of Perceived Exertion (Exercise) 15    Symptoms none    Duration Continue with 30 min of aerobic exercise without signs/symptoms of physical distress.    Intensity THRR unchanged      Progression   Progression Continue to progress workloads to maintain intensity without signs/symptoms of physical distress.    Average METs 5.18      Resistance Training   Training Prescription Yes    Weight 5 lb    Reps 10-15      Interval Training   Interval Training No      Treadmill   MPH 2.5    Grade 2    Minutes 15    METs 3.6      Recumbant Bike   Level 3    Watts 25    Minutes 15    METs 4.6      NuStep   Level 5    Minutes 15    METs 5.9      REL-XR   Level 8    Minutes 15    METs 7      Biostep-RELP   Level 3    Minutes 15    METs 4      Home Exercise Plan   Plans to continue exercise at [employer] (comment)   [employer], [employer], walking   Frequency Add 2 additional days to program exercise  sessions.    Initial Home Exercises Provided 11/08/22      Oxygen   Maintain Oxygen Saturation 88% or higher  Nutrition:  Target Goals: Understanding of nutrition guidelines, daily intake of sodium 1500mg , cholesterol 200mg , calories 30% from fat and 7% or less from saturated fats, daily to have 5 or more servings of fruits and vegetables.  Education: All About Nutrition: -Group instruction provided by verbal, written material, interactive activities, discussions, models, and posters to present general guidelines for heart healthy nutrition including fat, fiber, MyPlate, the role of sodium in heart healthy nutrition, utilization of the nutrition label, and utilization of this knowledge for meal planning. Follow up email sent as well. Written material given at graduation. Flowsheet Row [facility]  Education need identified 10/20/22       Biometrics:  Pre Biometrics - 10/20/22 1239       Pre Biometrics   Height 5\' 10"  (1.778 m)    Weight 252 lb 9.6 oz (114.6 kg)    Waist Circumference 50 inches    Hip Circumference 51.5 inches    Waist to Hip Ratio 0.97 %    BMI (Calculated) 36.24    Single Leg Stand 4.06 seconds              Nutrition Therapy Plan and Nutrition Goals:  Nutrition Therapy & Goals - 10/26/22 0802       Nutrition Therapy   Diet Heart healthy, low Na, T2DM    Drug/Food Interactions Statins/Certain Fruits    Protein (specify units) 85-95g    Fiber 33 grams    Whole Grain Foods 3 servings    Saturated Fats 18 max. grams    Fruits and Vegetables 8 servings/day    Sodium 2 grams      Personal Nutrition Goals   Nutrition Goal ST: practice MyPlate guidelines, begin by adding in fiber rich foods to meals/snacks LT: limit saturated fat <18g/day, limit Na <2g/day, maintain A1C <7, include 33g fiber/day    Comments [name] admitted to cardiac rehab with chronic stable angina. PMHx includes  CAD, HLD, HTN, OSA, T2DM, GERD, anxiety, TIA, chronic constipation. PSHX includes CABG (2014), bilateral TKA. Relevant medications includes adderall, lipitor, fluticasone-salmeterol inhaler, metformin, rabeprazole, tradjenta, trazodone, venlafaxine, zetia. Last A1C 03/18/22 was 7.3. showed PYP Score: 44. Vegetables & Fruits 3/12. Breads, Grains & Cereals 4/12. Red & Processed Meat 7/12. Poultry 2/2. Fish & Shellfish 0/4. Beans, Nuts & Seeds 2/4. Milk & Dairy Foods 3/6. Toppings, Oils, Seasonings & Salt 13/20. Sweets, Snacks & Restaurant Food 4/14. Beverages 6/10. B; does not usually eat breakfast. L around 1-2pm: usually something light and he will take his mom out to lunch (skids: burger with fries, biscuit with gravy) D: his wife cooks every night (hamburger or chicken on grill) with sometimes a baked potato. He reports not eating as much as he used to; he reports that he used to weigh 300lbs. He reports a lot of stress from his 69 y.o. mother as she has alzheimer's. Diet and caffiene free soda and some water as well as sweet tea. He reports the stress will cause him to snack like ice cream, peanut butter cracker, candy bars sometimes, cashews or peanuts. He uses whole wheat bread, but eats bread less often. He reports his wife follows a keto diet. Discussed heart healthy eating, T2DM MNT.      Intervention Plan   Intervention Prescribe, educate and counsel regarding individualized specific dietary modifications aiming towards targeted core components such as weight, hypertension, lipid management, diabetes, heart failure and other comorbidities.    Expected Outcomes Short  Term Goal: Understand basic principles of dietary content, such as calories, fat, sodium, cholesterol and nutrients.;Short Term Goal: A plan has been developed with personal nutrition goals set during dietitian appointment.;Long Term Goal: Adherence to prescribed nutrition plan.             Nutrition Assessments:  MEDIFICTS Score  Key: ?70 Need to make dietary changes  40-70 Heart Healthy Diet ? 40 Therapeutic Level Cholesterol Diet  [facility]  Picture Your Plate Total Score on Admission 44      Picture Your Plate Scores: <40<40 Unhealthy dietary pattern with much room for improvement. 41-50 Dietary pattern unlikely to meet recommendations for good health and room for improvement. 51-60 More healthful dietary pattern, with some room for improvement.  >60 Healthy dietary pattern, although there may be some specific behaviors that could be improved.    Nutrition Goals Re-Evaluation:  Nutrition Goals Re-Evaluation     Row Name 11/08/22 0820 11/24/22 0759           Goals   Nutrition Goal ST: practice MyPlate guidelines, begin by adding in fiber rich foods to meals/snacks LT: limit saturated fat <18g/day, limit Na <2g/day, maintain A1C <7, include 33g fiber/day Short: Stick to diet Long; Continue to focus on heart healthy eating      Comment Link Snufferddie is doing well in rehab. He is frustrated that his weight is not budging.  He is eating fiber and getting enough protein.  He has been able to cut back on his sodium intake too.  He is feeling confident in what he is eating.  We encourged him to stick with it. Link Snufferddie is doing well with his diet.  He is really doing better with portion control and watching what he eats.  He has also been working to cut out his snacking between meals.      Expected Outcome Short: Stick to diet Long; Continue to focus on heart healthy eating Short: Continue to work on portion size Long: COnitnue to eat healthier               Nutrition Goals Discharge (Final Nutrition Goals Re-Evaluation):  Nutrition Goals Re-Evaluation - 11/24/22 0759       Goals   Nutrition Goal Short: Stick to diet Long; Continue to focus on heart healthy eating    Comment Link Snufferddie is doing well with his diet.  He is really doing better with portion  control and watching what he eats.  He has also been working to cut out his snacking between meals.    Expected Outcome Short: Continue to work on portion size Long: COnitnue to eat healthier             Psychosocial: Target Goals: Acknowledge presence or absence of significant depression and/or stress, maximize coping skills, provide positive support system. Participant is able to verbalize types and ability to use techniques and skills needed for reducing stress and depression.   Education: Stress, Anxiety, and Depression - Group verbal and visual presentation to define topics covered.  Reviews how body is impacted by stress, anxiety, and depression.  Also discusses healthy ways to reduce stress and to treat/manage anxiety and depression.  Written material given at graduation. Flowsheet Row [facility]  Date 11/24/22  Educator [facility]  Instruction Review Code 1- [employer]bVerbalizes Understanding       Education: Sleep Hygiene -Provides group verbal and written instruction about  how sleep can affect your health.  Define sleep hygiene, discuss sleep cycles and impact of sleep habits. Review good sleep hygiene tips.    Initial Review & Psychosocial Screening:  Initial Psych Review & Screening - 10/12/22 1309       Initial Review   Current issues with Current Depression;Current Anxiety/Panic;Current Stress Concerns    Source of Stress Concerns Family      Family Dynamics   Good Support System? Yes   wife, sisters   Comments mother has dementia (41years old)      Barriers   Psychosocial barriers to participate in program There are no identifiable barriers or psychosocial needs.;The patient should benefit from training in stress management and relaxation.      Screening Interventions   Interventions Encouraged to exercise;Provide feedback about the scores to participant;To provide support and resources with identified psychosocial needs     Expected Outcomes Short Term goal: Utilizing psychosocial counselor, staff and physician to assist with identification of specific Stressors or current issues interfering with healing process. Setting desired goal for each stressor or current issue identified.;Long Term Goal: Stressors or current issues are controlled or eliminated.;Short Term goal: Identification and review with participant of any Quality of Life or Depression concerns found by scoring the questionnaire.;Long Term goal: The participant improves quality of Life and PHQ9 Scores as seen by post scores and/or verbalization of changes             Quality of Life Scores:   Quality of Life - 10/20/22 1240       Quality of Life   Select Quality of Life      Quality of Life Scores   Health/Function Pre 13.33 %    Socioeconomic Pre 18.38 %    Psych/Spiritual Pre 20.29 %    Family Pre 16.1 %    GLOBAL Pre 16.27 %            Scores of 19 and below usually indicate a poorer quality of life in these areas.  A difference of  2-3 points is a clinically meaningful difference.  A difference of 2-3 points in the total score of the Quality of Life Index has been associated with significant improvement in overall quality of life, self-image, physical symptoms, and general health in studies assessing change in quality of life.  PHQ-9: Review Flowsheet       11/08/2022 10/20/2022  Depression screen PHQ 2/9  Decreased Interest 0 1  Down, Depressed, Hopeless 0 1  PHQ - 2 Score 0 2  Altered sleeping 1 1  Tired, decreased energy 1 2  Change in appetite 0 0  Feeling bad or failure about yourself  0 2  Trouble concentrating 0 0  Moving slowly or fidgety/restless 0 0  Suicidal thoughts 0 0  PHQ-9 Score 2 7  Difficult doing work/chores Somewhat difficult Somewhat difficult   Interpretation of Total Score  Total Score Depression Severity:  1-4 = Minimal depression, 5-9 = Mild depression, 10-14 = Moderate depression, 15-19 =  Moderately severe depression, 20-27 = Severe depression   Psychosocial Evaluation and Intervention:  Psychosocial Evaluation - 10/12/22 1325       Psychosocial Evaluation & Interventions   Interventions Encouraged to exercise with the program and follow exercise prescription;Stress management education;Relaxation education    Comments Nathanal is coming to cardiac rehab with the diagnosis of stabel angina. He has done the program before after a stent and CABG. He has noticed his stamina is not like it  used to be. He has been taking his medications as he should and is aware he needs to increase his exercise to help improve his breathing. When asked about depression, anxiety, and stress he reported he has been experiencing all three. He is in communication with his doctor regarding these concerns. His main source of stress is caring for his 86 year old mother who has dementia. She lives 2 miles up the road so he is close by. His sisters also help out when they can. He wants to come to the program to boost his stamina and get back on track with exercise.    Expected Outcomes Short: attend cardiac rehab for education and exercise. Long: develop and maintain positive self care habits.    Continue Psychosocial Services  Follow up required by staff             Psychosocial Re-Evaluation:  Psychosocial Re-Evaluation     Row Name 11/08/22 0817 11/24/22 0756           Psychosocial Re-Evaluation   Current issues with Current Stress Concerns Current Stress Concerns      Comments Stiles is doing well in rehab.  His PHQ has improved greatly over the last month. He is moving better and no longer gasping like he was earlier.  He is sleeping well for the most part.  His wife and daughter both have COVID currently but he is still doing well.  He continues to care for his mother.  She is currently mad at him for not coming to visit with everyone being sick. He is going to go by today with his mask on. Gregary is  doing well in rehab.  His continues to worry over his mom who is 71 with dementia.  She was driving around looking for a buidling that no longer exsisted.  He talked a while with her yesterday, but knows she won't remember. He is sleeping better now that he is exercising on a regular basis.  He finds days he exercises he sleeps better.      Expected Outcomes Short: Conitnue to exercise for mental bosot Long: Conitnue to stay positive Short: continue to sleep well and cope with mother Long: Continue to exercise for mental boost      Interventions Encouraged to attend Cardiac Rehabilitation for the exercise Encouraged to attend Cardiac Rehabilitation for the exercise;Stress management education      Continue Psychosocial Services  Follow up required by staff Follow up required by staff               Psychosocial Discharge (Final Psychosocial Re-Evaluation):  Psychosocial Re-Evaluation - 11/24/22 0756       Psychosocial Re-Evaluation   Current issues with Current Stress Concerns    Comments Gustabo is doing well in rehab.  His continues to worry over his mom who is 22 with dementia.  She was driving around looking for a buidling that no longer exsisted.  He talked a while with her yesterday, but knows she won't remember. He is sleeping better now that he is exercising on a regular basis.  He finds days he exercises he sleeps better.    Expected Outcomes Short: continue to sleep well and cope with mother Long: Continue to exercise for mental boost    Interventions Encouraged to attend Cardiac Rehabilitation for the exercise;Stress management education    Continue Psychosocial Services  Follow up required by staff             Vocational Rehabilitation:  Provide vocational rehab assistance to qualifying candidates.   Vocational Rehab Evaluation & Intervention:  Vocational Rehab - 10/12/22 1309       Initial Vocational Rehab Evaluation & Intervention   Assessment shows need for Vocational  Rehabilitation No             Education: Education Goals: Education classes will be provided on a variety of topics geared toward better understanding of heart health and risk factor modification. Participant will state understanding/return demonstration of topics presented as noted by education test scores.  Learning Barriers/Preferences:  Learning Barriers/Preferences - 10/12/22 1308       Learning Barriers/Preferences   Learning Barriers Hearing    Learning Preferences Individual Instruction             General Cardiac Education Topics:  AED/CPR: - Group verbal and written instruction with the use of models to demonstrate the basic use of the AED with the basic ABC's of resuscitation. Flowsheet Row Pulmonary Rehab from 06/02/2015 in [facility]  Date 04/18/15  Educator [facility]  Instruction Review Code (retired) 2- Market researcher and Cardiac Procedures: - Group verbal and visual presentation and models provide information about basic cardiac anatomy and function. Reviews the testing methods done to diagnose heart disease and the outcomes of the test results. Describes the treatment choices: Medical Management, Angioplasty, or Coronary Bypass Surgery for treating various heart conditions including Myocardial Infarction, Angina, Valve Disease, and Cardiac Arrhythmias.  Written material given at graduation. Flowsheet Row Cardiac Rehab from 12/15/2022 in [facility] Cardiac and Pulmonary Rehab  Education need identified 10/20/22  Date 10/27/22  Educator [facility]  Instruction Review Code 1- Verbalizes Understanding       Medication Safety: - Group verbal and visual instruction to review commonly prescribed medications for heart and lung disease. Reviews the medication, class of the drug, and side effects. Includes the steps to properly store meds and maintain the prescription regimen.  Written material given at  graduation. Flowsheet Row Cardiac Rehab from 12/15/2022 in [facility] Cardiac and Pulmonary Rehab  Date 11/03/22  Educator SB  Instruction Review Code 1- Verbalizes Understanding       Intimacy: - Group verbal instruction through game format to discuss how heart and lung disease can affect sexual intimacy. Written material given at graduation.. Flowsheet Row Cardiac Rehab from 12/15/2022 in [facility] Cardiac and Pulmonary Rehab  Date 12/08/22  Educator [facility]  Instruction Review Code 1- Verbalizes Understanding       Know Your Numbers and Heart Failure: - Group verbal and visual instruction to discuss disease risk factors for cardiac and pulmonary disease and treatment options.  Reviews associated critical values for Overweight/Obesity, Hypertension, Cholesterol, and Diabetes.  Discusses basics of heart failure: signs/symptoms and treatments.  Introduces Heart Failure Zone chart for action plan for heart failure.  Written material given at graduation. Flowsheet Row Cardiac Rehab from 12/15/2022 in [facility] Cardiac and Pulmonary Rehab  Date 11/10/22  Educator SB  Instruction Review Code 1- Verbalizes Understanding       Infection Prevention: - Provides verbal and written material to individual with discussion of infection control including proper hand washing and proper equipment cleaning during exercise session. Flowsheet Row Cardiac Rehab from 12/15/2022 in [facility] Cardiac and Pulmonary Rehab  Date 10/20/22  Educator [facility]  Instruction Review Code 1- Verbalizes Understanding       Falls Prevention: - Provides verbal and written material to individual with discussion of falls prevention and  safety. Flowsheet Row Cardiac Rehab from 12/15/2022 in [facility] Cardiac and Pulmonary Rehab  Date 10/20/22  Educator [facility]  Instruction Review Code 1- Verbalizes Understanding       Other: -Provides group and verbal instruction on various topics (see comments)   Knowledge Questionnaire Score:  Knowledge  Questionnaire Score - 10/20/22 1241       Knowledge Questionnaire Score   Pre Score 23/26             Core Components/Risk Factors/Patient Goals at Admission:  Personal Goals and Risk Factors at Admission - 10/20/22 1241       Core Components/Risk Factors/Patient Goals on Admission    Weight Management Yes    Intervention Weight Management: Develop a combined nutrition and exercise program designed to reach desired caloric intake, while maintaining appropriate intake of nutrient and fiber, sodium and fats, and appropriate energy expenditure required for the weight goal.;Weight Management: Provide education and appropriate resources to help participant work on and attain dietary goals.;Weight Management/Obesity: Establish reasonable short term and long term weight goals.    Admit Weight 252 lb 9.6 oz (114.6 kg)    Goal Weight: Short Term 245 lb (111.1 kg)    Goal Weight: Long Term 220 lb (99.8 kg)    Expected Outcomes Short Term: Continue to assess and modify interventions until short term weight is achieved;Long Term: Adherence to nutrition and physical activity/exercise program aimed toward attainment of established weight goal;Weight Loss: Understanding of general recommendations for a balanced deficit meal plan, which promotes 1-2 lb weight loss per week and includes a negative energy balance of [phone] kcal/d;Understanding recommendations for meals to include 15-35% energy as protein, 25-35% energy from fat, 35-60% energy from carbohydrates, less than 200mg  of dietary cholesterol, 20-35 gm of total fiber daily;Understanding of distribution of calorie intake throughout the day with the consumption of 4-5 meals/snacks    Improve shortness of breath with ADL's Yes    Intervention Provide education, individualized exercise plan and daily activity instruction to help decrease symptoms of SOB with activities of daily living.    Expected Outcomes Short Term: Improve cardiorespiratory fitness  to achieve a reduction of symptoms when performing ADLs;Long Term: Be able to perform more ADLs without symptoms or delay the onset of symptoms    Increase knowledge of respiratory medications and ability to use respiratory devices properly  Yes    Intervention Provide education and demonstration as needed of appropriate use of medications, inhalers, and oxygen therapy.    Expected Outcomes Short Term: Achieves understanding of medications use. Understands that oxygen is a medication prescribed by physician. Demonstrates appropriate use of inhaler and oxygen therapy.;Long Term: Maintain appropriate use of medications, inhalers, and oxygen therapy.    Diabetes Yes    Intervention Provide education about signs/symptoms and action to take for hypo/hyperglycemia.;Provide education about proper nutrition, including hydration, and aerobic/resistive exercise prescription along with prescribed medications to achieve blood glucose in normal ranges: Fasting glucose 65-99 mg/dL    Expected Outcomes Short Term: Participant verbalizes understanding of the signs/symptoms and immediate care of hyper/hypoglycemia, proper foot care and importance of medication, aerobic/resistive exercise and nutrition plan for blood glucose control.;Long Term: Attainment of HbA1C < 7%.    Hypertension Yes    Intervention Provide education on lifestyle modifcations including regular physical activity/exercise, weight management, moderate sodium restriction and increased consumption of fresh fruit, vegetables, and low fat dairy, alcohol moderation, and smoking cessation.;Monitor prescription use compliance.    Expected Outcomes Short Term: Continued assessment and intervention  until BP is < 140/3190mm HG in hypertensive participants. < 130/2480mm HG in hypertensive participants with diabetes, heart failure or chronic kidney disease.;Long Term: Maintenance of blood pressure at goal levels.    Lipids Yes    Intervention Provide education and  support for participant on nutrition & aerobic/resistive exercise along with prescribed medications to achieve LDL 70mg , HDL >40mg .    Expected Outcomes Short Term: Participant states understanding of desired cholesterol values and is compliant with medications prescribed. Participant is following exercise prescription and nutrition guidelines.;Long Term: Cholesterol controlled with medications as prescribed, with individualized exercise RX and with personalized nutrition plan. Value goals: LDL < 70mg , HDL > 40 mg.             Education:Diabetes - Individual verbal and written instruction to review signs/symptoms of diabetes, desired ranges of glucose level fasting, after meals and with exercise. Acknowledge that pre and post exercise glucose checks will be done for 3 sessions at entry of program. Flowsheet Row [facility]  Date 10/20/22  Educator [facility]  Instruction Review Code 1- Verbalizes Understanding       Core Components/Risk Factors/Patient Goals Review:   Goals and Risk Factor Review     Row Name 11/08/22 0822 11/24/22 0800           Core Components/Risk Factors/Patient Goals Review   Personal Goals Review Weight Management/Obesity;Hypertension;Diabetes;Lipids;Improve shortness of breath with ADL's Weight Management/Obesity;Hypertension;Diabetes;Lipids;Improve shortness of breath with ADL's      Review Link Snufferddie is doing well in rehab. He is frustrated that his weight is not moving but plans to stick to his diet.  His pressures are doing well and he has been checking them at home.  His blood sugars are staying between 120-130 each day!!  He is also doing more now without the SOB! Link Snufferddie is doing well.  His weight is still not budging.  His doctor offered ozempic or wengovia but he wanted to finish rehab first.  We talked about adding in another day of exercise could help with calorie burn or to add in more weight lifting at home as he  has not been doing any of that thus far.  His breathing is getting better and he is able to do more at home.  His pressures are doing well and so are his sugars.      Expected Outcomes Short: Continue to exercise for weight loss Long; Conitnue to monitor risk factors SHort: Try adding in resistance training at home to help with weight loss Long: Continue to montior risk factors.               Core Components/Risk Factors/Patient Goals at Discharge (Final Review):   Goals and Risk Factor Review - 11/24/22 0800       Core Components/Risk Factors/Patient Goals Review   Personal Goals Review Weight Management/Obesity;Hypertension;Diabetes;Lipids;Improve shortness of breath with ADL's    Review Link Snufferddie is doing well.  His weight is still not budging.  His doctor offered ozempic or wengovia but he wanted to finish rehab first.  We talked about adding in another day of exercise could help with calorie burn or to add in more weight lifting at home as he has not been doing any of that thus far.  His breathing is getting better and he is able to do more at home.  His pressures are doing well and so are his sugars.    Expected Outcomes SHort: Try adding in resistance training at  home to help with weight loss Long: Continue to montior risk factors.             ITP Comments:  ITP Comments     Row Name 10/12/22 1324 10/20/22 1229 10/25/22 1013 10/26/22 0913 11/17/22 1049   ITP Comments Initial phone call completed. Diagnosis can be found in [facility] 11/14. EP Orientation scheduled for Wednesday 11/29 at 10:30. Completed and gym orientation. Initial ITP created and sent for review to Dr. Bethann Punches, Medical Director. First full day of exercise!  Patient was oriented to gym and equipment including functions, settings, policies, and procedures.  Patient's individual exercise prescription and treatment plan were reviewed.  All starting workloads were established based on the results of the 6 minute walk  test done at initial orientation visit.  The plan for exercise progression was also introduced and progression will be customized based on patient's performance and goals. Completed initial RD consultation 30 Day review completed. Medical Director ITP review done, changes made as directed, and signed approval by Medical Director.    Row Name 12/06/22 0825 12/10/22 0837 12/15/22 0959       ITP Comments Jaysun states that he felt his right knee pop. He was walking on the treadmill and felt it. He immediatly stopped. He feels at this time he should go home, elevate and ice it. Informed patient to call if it is any worse over the next day or two. Patient also informed not to come to rehab if it he is to much pain. Jeanmarc states his angina symptoms are improved and that he can go up flight of stairs without getting short of breath.   He did reach out to his ortho doctor.  was advised to not  do much weight bearing activity for a couple weeks. 30 Day review completed. Medical Director ITP review done, changes made as directed, and signed approval by Medical Director.              Comments:

## 2022-12-17 ENCOUNTER — Encounter: Payer: PPO | Admitting: *Deleted

## 2022-12-17 VITALS — Ht 70.25 in | Wt 255.0 lb

## 2022-12-17 DIAGNOSIS — I2089 Other forms of angina pectoris: Secondary | ICD-10-CM | POA: Diagnosis not present

## 2022-12-17 NOTE — Progress Notes (Signed)
Daily Session Note  Patient Details  Name: WILGUS DEYTON MRN: [id number] Date of Birth: August 04, 1956 Referring Provider:   Flowsheet Row Cardiac Rehab from 10/20/2022 in [facility] Cardiac and Pulmonary Rehab  Referring Provider Gollan       Encounter Date: 12/17/2022  Check In:  Session Check In - 12/17/22 0834       Check-In   Supervising physician immediately available to respond to emergencies See telemetry face sheet for immediately available ER MD    Location [facility]-Cardiac & Pulmonary Rehab    Staff Present Heath Lark, RN, BSN, CCRP;Jessica Vesta, MA, RCEP, CCRP, CCET;Joseph Octa, [place]    Virtual Visit No    Medication changes reported     No    Fall or balance concerns reported    No    Warm-up and Cool-down Performed on first and last piece of equipment    Resistance Training Performed Yes    VAD Patient? No    PAD/SET Patient? No      Pain Assessment   Currently in Pain? No/denies              6 Minute Walk     Row Name 10/20/22 1229 12/17/22 0824       6 Minute Walk   Phase Initial Discharge    Distance 1430 feet 1665 feet    Distance % Change -- 16.4 %    Distance Feet Change -- 235 ft    Walk Time 6 minutes 6 minutes    # of Rest Breaks 0 0    MPH 2.71 3.15    METS 3.17 4.1    RPE 17 12    Perceived Dyspnea  3 3    VO2 Peak 11.11 14.37    Symptoms No Yes (comment)    Comments -- SOB    Resting HR 74 bpm 96 bpm    Resting BP 122/80 124/72    Resting Oxygen Saturation  99 % 98 %    Exercise Oxygen Saturation  during 6 min walk 99 % 98 %    Max Ex. HR 118 bpm 135 bpm    Max Ex. BP 160/72 194/84    2 Minute Post BP 128/82 --              Social History   Tobacco Use  Smoking Status Never  Smokeless Tobacco Never    Goals Met:  Independence with exercise equipment Exercise tolerated well No report of concerns or symptoms today  Goals Unmet:  Not Applicable  Comments: Pt able to follow exercise prescription today without  complaint.  Will continue to monitor for progression.    Dr. Emily Filbert is Medical Director for [place].  Dr. Ottie Glazier is Medical Director for [facility] Pulmonary Rehabilitation.

## 2022-12-20 ENCOUNTER — Encounter: Payer: PPO | Admitting: *Deleted

## 2022-12-20 DIAGNOSIS — I2089 Other forms of angina pectoris: Secondary | ICD-10-CM

## 2022-12-20 NOTE — Progress Notes (Signed)
Daily Session Note  Patient Details  Name: [name] MRN: [id number] Date of Birth: 07-05-1956 Referring Provider:   Flowsheet Row Cardiac Rehab from 10/20/2022 in [facility] Cardiac and Pulmonary Rehab  Referring Provider Gollan       Encounter Date: 12/20/2022  Check In:  Session Check In - 12/20/22 0743       Check-In   Supervising physician immediately available to respond to emergencies See telemetry face sheet for immediately available ER MD    Location [facility]-Cardiac & Pulmonary Rehab    Staff Present Darlyne Russian, RN, ADN;Joseph Tessie Fass, RCP,RRT,BSRT;Kelly Amedeo Plenty, BS, ACSM CEP, Exercise Physiologist    Virtual Visit No    Medication changes reported     No    Fall or balance concerns reported    No    Warm-up and Cool-down Performed on first and last piece of equipment    Resistance Training Performed Yes    VAD Patient? No    PAD/SET Patient? No      Pain Assessment   Currently in Pain? No/denies                Social History   Tobacco Use  Smoking Status Never  Smokeless Tobacco Never    Goals Met:  Independence with exercise equipment Exercise tolerated well Personal goals reviewed No report of concerns or symptoms today Strength training completed today  Goals Unmet:  Not Applicable  Comments: Pt able to follow exercise prescription today without complaint.  Will continue to monitor for progression.    Dr. Emily Filbert is Medical Director for [place].  Dr. Ottie Glazier is Medical Director for [facility] Pulmonary Rehabilitation.

## 2022-12-22 ENCOUNTER — Encounter: Payer: PPO | Admitting: *Deleted

## 2022-12-22 DIAGNOSIS — I2089 Other forms of angina pectoris: Secondary | ICD-10-CM | POA: Diagnosis not present

## 2022-12-22 NOTE — Progress Notes (Signed)
Daily Session Note  Patient Details  Name: [name] MRN: [id number] Date of Birth: Jun 03, 1956 Referring Provider:   Flowsheet Row Cardiac Rehab from 10/20/2022 in [facility] Cardiac and Pulmonary Rehab  Referring Provider Gollan       Encounter Date: 12/22/2022  Check In:  Session Check In - 12/22/22 0754       Check-In   Supervising physician immediately available to respond to emergencies See telemetry face sheet for immediately available ER MD    Location [facility]-Cardiac & Pulmonary Rehab    Staff Present Darlyne Russian, RN, ADN;Jessica Luan Pulling, MA, RCEP, CCRP, CCET;Noah Tickle, BS, Exercise Physiologist    Virtual Visit No    Medication changes reported     No    Fall or balance concerns reported    No    Warm-up and Cool-down Performed on first and last piece of equipment    Resistance Training Performed Yes    VAD Patient? No    PAD/SET Patient? No      Pain Assessment   Currently in Pain? No/denies                Social History   Tobacco Use  Smoking Status Never  Smokeless Tobacco Never    Goals Met:  Independence with exercise equipment Exercise tolerated well No report of concerns or symptoms today Strength training completed today  Goals Unmet:  Not Applicable  Comments: Pt able to follow exercise prescription today without complaint.  Will continue to monitor for progression.    Dr. Emily Filbert is Medical Director for [place].  Dr. Ottie Glazier is Medical Director for [facility] Pulmonary Rehabilitation.

## 2022-12-24 ENCOUNTER — Encounter: Payer: PPO | Attending: Cardiovascular Disease | Admitting: *Deleted

## 2022-12-24 DIAGNOSIS — I2089 Other forms of angina pectoris: Secondary | ICD-10-CM | POA: Insufficient documentation

## 2022-12-24 NOTE — Progress Notes (Signed)
Daily Session Note  Patient Details  Name: [name] MRN: [id number] Date of Birth: 10/30/1956 Referring Provider:   Flowsheet Row Cardiac Rehab from 10/20/2022 in [facility] Cardiac and Pulmonary Rehab  Referring Provider Gollan       Encounter Date: 12/24/2022  Check In:  Session Check In - 12/24/22 0272       Check-In   Supervising physician immediately available to respond to emergencies See telemetry face sheet for immediately available ER MD    Location [facility]-Cardiac & Pulmonary Rehab    Staff Present Heath Lark, RN, BSN, CCRP;Jessica Quentin, MA, RCEP, CCRP, CCET;Joseph [place], [place]    Virtual Visit No    Medication changes reported     No    Fall or balance concerns reported    No    Warm-up and Cool-down Performed on first and last piece of equipment    Resistance Training Performed Yes    VAD Patient? No    PAD/SET Patient? No      Pain Assessment   Currently in Pain? No/denies                Social History   Tobacco Use  Smoking Status Never  Smokeless Tobacco Never    Goals Met:  Independence with exercise equipment Exercise tolerated well No report of concerns or symptoms today  Goals Unmet:  Not Applicable  Comments: Pt able to follow exercise prescription today without complaint.  Will continue to monitor for progression.    Dr. Emily Filbert is Medical Director for [place].  Dr. Ottie Glazier is Medical Director for [facility] Pulmonary Rehabilitation.

## 2022-12-27 ENCOUNTER — Encounter: Payer: PPO | Admitting: *Deleted

## 2022-12-27 DIAGNOSIS — I2089 Other forms of angina pectoris: Secondary | ICD-10-CM

## 2022-12-27 NOTE — Progress Notes (Signed)
Daily Session Note  Patient Details  Name: [name] MRN: [id number] Date of Birth: 02-07-1956 Referring Provider:   Flowsheet Row Cardiac Rehab from 10/20/2022 in [facility] Cardiac and Pulmonary Rehab  Referring Provider Gollan       Encounter Date: 12/27/2022  Check In:  Session Check In - 12/27/22 0807       Check-In   Supervising physician immediately available to respond to emergencies See telemetry face sheet for immediately available ER MD    Location [facility]-Cardiac & Pulmonary Rehab    Staff Present Darlyne Russian, RN, Doyce Para, BS, ACSM CEP, Exercise Physiologist;Joseph Tessie Fass, [place]    Virtual Visit No    Medication changes reported     No    Fall or balance concerns reported    No    Warm-up and Cool-down Performed on first and last piece of equipment    Resistance Training Performed Yes    VAD Patient? No    PAD/SET Patient? No      Pain Assessment   Currently in Pain? No/denies                Social History   Tobacco Use  Smoking Status Never  Smokeless Tobacco Never    Goals Met:  Independence with exercise equipment Exercise tolerated well No report of concerns or symptoms today Strength training completed today  Goals Unmet:  Not Applicable  Comments: Pt able to follow exercise prescription today without complaint.  Will continue to monitor for progression.    Dr. Emily Filbert is Medical Director for [place].  Dr. Ottie Glazier is Medical Director for [facility] Pulmonary Rehabilitation.

## 2022-12-29 ENCOUNTER — Encounter: Payer: PPO | Admitting: *Deleted

## 2022-12-29 DIAGNOSIS — I2089 Other forms of angina pectoris: Secondary | ICD-10-CM

## 2022-12-29 NOTE — Progress Notes (Signed)
Daily Session Note  Patient Details  Name: [name] MRN: [id number] Date of Birth: January 23, 1956 Referring Provider:   Flowsheet Row Cardiac Rehab from 10/20/2022 in [facility] Cardiac and Pulmonary Rehab  Referring Provider Gollan       Encounter Date: 12/29/2022  Check In:  Session Check In - 12/29/22 0817       Check-In   Supervising physician immediately available to respond to emergencies See telemetry face sheet for immediately available ER MD    Location [facility]-Cardiac & Pulmonary Rehab    Staff Present Darlyne Russian, RN, Lorin Mercy, MS, ACSM CEP, Exercise Physiologist;Noah Tickle, BS, Exercise Physiologist    Virtual Visit No    Medication changes reported     No    Fall or balance concerns reported    No    Warm-up and Cool-down Performed on first and last piece of equipment    Resistance Training Performed Yes    VAD Patient? No    PAD/SET Patient? No      Pain Assessment   Currently in Pain? No/denies                Social History   Tobacco Use  Smoking Status Never  Smokeless Tobacco Never    Goals Met:  Independence with exercise equipment Exercise tolerated well No report of concerns or symptoms today Strength training completed today  Goals Unmet:  Not Applicable  Comments:  [name] graduated today from  rehab with 36 sessions completed.  Details of the patient's exercise prescription and what He needs to do in order to continue the prescription and progress were discussed with patient.  Patient was given a copy of prescription and goals.  Patient verbalized understanding.  [name] plans to continue to exercise by [employer], [employer], and walking.    Dr. Emily Filbert is Medical Director for [place].  Dr. Ottie Glazier is Medical Director for [facility] Pulmonary Rehabilitation.

## 2022-12-29 NOTE — Patient Instructions (Signed)
Discharge Patient Instructions  Patient Details  Name: [name] MRN: [id number] Date of Birth: 02-12-1956 Referring Provider:  Juluis Pitch, MD   Number of Visits: 8  Reason for Discharge:  Patient reached a stable level of exercise. Patient independent in their exercise. Patient has met program and personal goals.  Diagnosis:  Chronic stable angina  Initial Exercise Prescription:  Initial Exercise Prescription - 10/20/22 1200       Date of Initial Exercise RX and Referring Provider   Date 10/20/22    Referring Provider Gollan      Oxygen   Maintain Oxygen Saturation 88% or higher      Treadmill   MPH 2.3    Grade 1    Minutes 15    METs 3.08      Arm Ergometer   Level 1    RPM 30    Minutes 15    METs 3.17      REL-XR   Level 2    Speed 50    Minutes 15    METs 3.17      Track   Laps 30    Minutes 15    METs 2.63      Prescription Details   Frequency (times per week) 3    Duration Progress to 30 minutes of continuous aerobic without signs/symptoms of physical distress      Intensity   THRR 40-80% of Max Heartrate 105-136    Ratings of Perceived Exertion 11-13    Perceived Dyspnea 0-4      Progression   Progression Continue to progress workloads to maintain intensity without signs/symptoms of physical distress.      Resistance Training   Training Prescription Yes    Weight 5    Reps 10-15             Discharge Exercise Prescription (Final Exercise Prescription Changes):  Exercise Prescription Changes - 12/27/22 1300       Response to Exercise   Blood Pressure (Admit) 124/62    Blood Pressure (Exit) 122/60    Heart Rate (Admit) 71 bpm    Heart Rate (Exercise) 109 bpm    Heart Rate (Exit) 82 bpm    Rating of Perceived Exertion (Exercise) 15    Symptoms none    Duration Continue with 30 min of aerobic exercise without signs/symptoms of physical distress.    Intensity THRR unchanged      Progression   Progression  Continue to progress workloads to maintain intensity without signs/symptoms of physical distress.    Average METs 5.2      Resistance Training   Training Prescription Yes    Weight 5 lb    Reps 10-15      Interval Training   Interval Training No      Treadmill   MPH 2.5    Grade 2    Minutes 15    METs 3.6      Recumbant Bike   Level 3    Watts 25    Minutes 15    METs 5.2      NuStep   Level 4    Minutes 15    METs 6.3      Recumbant Elliptical   Level 3    Minutes 15      REL-XR   Level 8    Minutes 15    METs 7.2      Biostep-RELP   Level 4    Minutes 15  METs 4      Home Exercise Plan   Plans to continue exercise at [facility] (comment)   [employer], [employer], walking   Frequency Add 2 additional days to program exercise sessions.    Initial Home Exercises Provided 11/08/22      Oxygen   Maintain Oxygen Saturation 88% or higher             Functional Capacity:  6 Minute Walk     Row Name 10/20/22 1229 12/17/22 0824       6 Minute Walk   Phase Initial Discharge    Distance 1430 feet 1665 feet    Distance % Change -- 16.4 %    Distance Feet Change -- 235 ft    Walk Time 6 minutes 6 minutes    # of Rest Breaks 0 0    MPH 2.71 3.15    METS 3.17 4.1    RPE 17 12    Perceived Dyspnea  3 3    VO2 Peak 11.11 14.37    Symptoms No Yes (comment)    Comments -- SOB    Resting HR 74 bpm 96 bpm    Resting BP 122/80 124/72    Resting Oxygen Saturation  99 % 98 %    Exercise Oxygen Saturation  during 6 min walk 99 % 98 %    Max Ex. HR 118 bpm 135 bpm    Max Ex. BP 160/72 194/84    2 Minute Post BP 128/82 --             Nutrition & Weight - Outcomes:  Pre Biometrics - 10/20/22 1239       Pre Biometrics   Height 5\' 10"  (1.778 [name])    Weight 252 lb 9.6 oz (114.6 kg)    Waist Circumference 50 inches    Hip Circumference 51.5 inches    Waist to Hip Ratio 0.97 %    BMI (Calculated) 36.24    Single Leg Stand 4.06 seconds              Post Biometrics - 12/17/22 0825        Post  Biometrics   Height 5' 10.25" (1.784 [name])    Weight 255 lb (115.7 kg)    Waist Circumference 45 inches    Hip Circumference 48.5 inches    Waist to Hip Ratio 0.93 %    BMI (Calculated) 36.34    Single Leg Stand 7.2 seconds             Nutrition:  Nutrition Therapy & Goals - 10/26/22 0802       Nutrition Therapy   Diet Heart healthy, low Na, T2DM    Drug/Food Interactions Statins/Certain Fruits    Protein (specify units) 85-95g    Fiber 33 grams    Whole Grain Foods 3 servings    Saturated Fats 18 max. grams    Fruits and Vegetables 8 servings/day    Sodium 2 grams      Personal Nutrition Goals   Nutrition Goal ST: practice MyPlate guidelines, begin by adding in fiber rich foods to meals/snacks LT: limit saturated fat <18g/day, limit Na <2g/day, maintain A1C <7, include 33g fiber/day    Comments [name] admitted to cardiac rehab with chronic stable angina. PMHx includes CAD, HLD, HTN, OSA, T2DM, GERD, anxiety, TIA, chronic constipation. PSHX includes CABG (2014), bilateral TKA. Relevant medications includes adderall, lipitor, fluticasone-salmeterol inhaler, metformin, rabeprazole, tradjenta, trazodone, venlafaxine, zetia. Last A1C 03/18/22  was 7.3. showed PYP Score: 44. Vegetables & Fruits 3/12. Breads, Grains & Cereals 4/12. Red & Processed Meat 7/12. Poultry 2/2. Fish & Shellfish 0/4. Beans, Nuts & Seeds 2/4. Milk & Dairy Foods 3/6. Toppings, Oils, Seasonings & Salt 13/20. Sweets, Snacks & Restaurant Food 4/14. Beverages 6/10. B; does not usually eat breakfast. L around 1-2pm: usually something light and he will take his mom out to lunch (skids: burger with fries, biscuit with gravy) D: his wife cooks every night (hamburger or chicken on grill) with sometimes a baked potato. He reports not eating as much as he used to; he reports that he used to weigh 300lbs. He reports a lot of stress from his 49 y.o. mother as she has  alzheimer's. Diet and caffiene free soda and some water as well as sweet tea. He reports the stress will cause him to snack like ice cream, peanut butter cracker, candy bars sometimes, cashews or peanuts. He uses whole wheat bread, but eats bread less often. He reports his wife follows a keto diet. Discussed heart healthy eating, T2DM MNT.      Intervention Plan   Intervention Prescribe, educate and counsel regarding individualized specific dietary modifications aiming towards targeted core components such as weight, hypertension, lipid management, diabetes, heart failure and other comorbidities.    Expected Outcomes Short Term Goal: Understand basic principles of dietary content, such as calories, fat, sodium, cholesterol and nutrients.;Short Term Goal: A plan has been developed with personal nutrition goals set during dietitian appointment.;Long Term Goal: Adherence to prescribed nutrition plan.

## 2022-12-29 NOTE — Progress Notes (Signed)
Discharge Summary: [name] (DOB: 04-14-1956)  [name] graduated today from  rehab with 36 sessions completed.  Details of the patient's exercise prescription and what He needs to do in order to continue the prescription and progress were discussed with patient.  Patient was given a copy of prescription and goals.  Patient verbalized understanding.  [name] plans to continue to exercise by [employer], [employer], and walking.   [place] Name 10/20/22 1229 12/17/22 0824       6 Minute Walk   Phase Initial Discharge    Distance 1430 feet 1665 feet    Distance % Change -- 16.4 %    Distance Feet Change -- 235 ft    Walk Time 6 minutes 6 minutes    # of Rest Breaks 0 0    MPH 2.71 3.15    METS 3.17 4.1    RPE 17 12    Perceived Dyspnea  3 3    VO2 Peak 11.11 14.37    Symptoms No Yes (comment)    Comments -- SOB    Resting HR 74 bpm 96 bpm    Resting BP 122/80 124/72    Resting Oxygen Saturation  99 % 98 %    Exercise Oxygen Saturation  during 6 min walk 99 % 98 %    Max Ex. HR 118 bpm 135 bpm    Max Ex. BP 160/72 194/84    2 Minute Post BP 128/82 --

## 2022-12-29 NOTE — Progress Notes (Signed)
Cardiac Individual Treatment Plan  Patient Details  Name: [name] MRN: [id number] Date of Birth: [birth year] Referring Provider:   Flowsheet Row Cardiac Rehab from 10/20/2022 in [facility] Cardiac and Pulmonary Rehab  Referring Provider Gollan       Initial Encounter Date:  Flowsheet Row Cardiac Rehab from 10/20/2022 in [facility] Cardiac and Pulmonary Rehab  Date 10/20/22       Visit Diagnosis: Chronic stable angina  Patient's Home Medications on Admission:  Current Outpatient Medications:    acetaminophen (TYLENOL) 325 MG tablet, Take 650 mg by mouth every 6 (six) hours as needed for moderate pain or headache. , Disp: , Rfl:    albuterol (PROVENTIL HFA;VENTOLIN HFA) 108 (90 BASE) MCG/ACT inhaler, Inhale 1-2 puffs into the lungs every 6 (six) hours as needed for wheezing or shortness of breath. , Disp: , Rfl:    alfuzosin (UROXATRAL) 10 MG 24 hr tablet, Take 10 mg by mouth daily., Disp: , Rfl:    Amphetamine-Dextroamphetamine (ADDERALL PO), Take by mouth as needed., Disp: , Rfl:    ASPIRIN 81 PO, Take by mouth daily., Disp: , Rfl:    atorvastatin (LIPITOR) 40 MG tablet, Take 40 mg by mouth daily. , Disp: , Rfl:    cetirizine (ZYRTEC) 10 MG tablet, Take 10 mg by mouth daily as needed for allergies. , Disp: , Rfl:    diphenhydrAMINE (BENADRYL) 50 MG capsule, Take 50 mg by mouth at bedtime as needed for sleep., Disp: , Rfl:    ezetimibe (ZETIA) 10 MG tablet, TAKE 1 TABLET BY MOUTH EVERY DAY, Disp: 90 tablet, Rfl: 2   fluticasone (FLONASE) 50 MCG/ACT nasal spray, Place into the nose., Disp: , Rfl:    fluticasone-salmeterol (ADVAIR HFA) 115-21 MCG/ACT inhaler, Inhale 2 puffs into the lungs 2 (two) times daily., Disp: 1 Inhaler, Rfl: 6   losartan (COZAAR) 50 MG tablet, Take 50 mg by mouth daily., Disp: , Rfl:    metFORMIN (GLUCOPHAGE) 850 MG tablet, Take 850 mg by mouth daily with breakfast. , Disp: , Rfl:    metoprolol succinate (TOPROL-XL) 25 MG 24 hr tablet, Take by mouth., Disp: , Rfl:     mirabegron ER (MYRBETRIQ) 25 MG TB24 tablet, , Disp: , Rfl:    nitroGLYCERIN (NITROSTAT) 0.4 MG SL tablet, Place 1 tablet (0.4 mg total) under the tongue every 5 (five) minutes as needed for chest pain., Disp: 25 tablet, Rfl: 3   RABEprazole (ACIPHEX) 20 MG tablet, Take 20 mg by mouth daily. , Disp: , Rfl:    TRADJENTA 5 MG TABS tablet, Take 5 mg by mouth daily., Disp: , Rfl:    traZODone (DESYREL) 100 MG tablet, TAKE 1/2 TO 1 TABLET BY MOUTH NIGHTLY., Disp: , Rfl:    venlafaxine XR (EFFEXOR-XR) 75 MG 24 hr capsule, Take 225 mg by mouth daily., Disp: , Rfl:   Past Medical History: Past Medical History:  Diagnosis Date   Anxiety    Atherosclerosis of coronary artery bypass graft with angina pectoris (HCC)    Barrett's esophagus    Benign enlargement of prostate    Chronic constipation    Colon polyp    Coronary artery disease    COVID-19    1/22 3/22   Depression    GERD (gastroesophageal reflux disease)    H/O esophageal spasm    Hyperlipidemia    Hypertension    MI (myocardial infarction) ([place]) 2011 & 2014   x 2   Post-COVID chronic cough    Sleep apnea  after weight loss no need for CPAP   TIA (transient ischemic attack)    "many yrs ago" - no deficits   Type 2 diabetes mellitus (HCC)    Wears hearing aid in both ears     Tobacco Use: Social History   Tobacco Use  Smoking Status Never  Smokeless Tobacco Never    Labs: Review Flowsheet       Latest Ref Rng & Units 04/22/2009 11/18/2009 11/19/2009 11/02/2018  Labs for ITP Cardiac and Pulmonary Rehab  Cholestrol 0 - 200 mg/dL - - 161114        ATP III CLASSIFICATION:  <200     mg/dL   Desirable  [id number]  mg/dL   Borderline High  >=409>=240    mg/dL   High         -  LDL (calc) 0 - 99 mg/dL - - 70        Total Cholesterol/HDL:CHD Risk Coronary Heart Disease Risk Table                     Men   Women  1/2 Average Risk   3.4   3.3  Average Risk       5.0   4.4  2 X Average Risk   9.6   7.1  3 X Average Risk   23.4   11.0        Use the calculated Patient Ratio above and the CHD Risk Table to determine the patient's CHD Risk.        ATP III CLASSIFICATION (LDL):  <100     mg/dL   Optimal  [id number]  mg/dL   Near or Above                    Optimal  130-159  mg/dL   Borderline  [id number]  mg/dL   High  >213>190     mg/dL   Very High  -  HDL-C >08>39 mg/dL - - 29  -  Trlycerides <150 mg/dL - - 75  -  Hemoglobin M5HA1c 4.8 - 5.6 % - 5.7 (NOTE) The ADA recommends the following therapeutic goal for glycemic control related to Hgb A1c measurement: Goal of therapy: <6.5 Hgb A1c  Reference: American Diabetes Association: Clinical Practice Recommendations 2010, Diabetes Care, 2010, 33: (Suppl  1).  - 4.8   TCO2 0 - 100 mmol/L 21  - - -     Exercise Target Goals: Exercise Program Goal: Individual exercise prescription set using results from initial 6 min walk test and THRR while considering  patient's activity barriers and safety.   Exercise Prescription Goal: Initial exercise prescription builds to 30-45 minutes a day of aerobic activity, 2-3 days per week.  Home exercise guidelines will be given to patient during program as part of exercise prescription that the participant will acknowledge.   Education: Aerobic Exercise: - Group verbal and visual presentation on the components of exercise prescription. Introduces F.I.T.T principle from ACSM for exercise prescriptions.  Reviews F.I.T.T. principles of aerobic exercise including progression. Written material given at graduation. Flowsheet Row Card[facility]  Education need identified 10/20/22  Date 12/08/22  Educator [facility]  Instruction Review Code 1- Verbalizes Understanding       Education: Resistance Exercise: - Group verbal and visual presentation on the components of exercise prescription. Introduces F.I.T.T principle from ACSM for exercise prescriptions  Reviews F.I.T.T. principles of resistance  exercise including progression. Written  material given at graduation. Flowsheet Row Cardiac Rehab from 12/15/2022 in [facility] Cardiac and Pulmonary Rehab  Date 12/15/22  Educator [facility]  Instruction Review Code 1- Verbalizes Understanding        Education: Exercise & Equipment Safety: - Individual verbal instruction and demonstration of equipment use and safety with use of the equipment. Flowsheet Row Cardiac Rehab from 12/15/2022 in [facility] Cardiac and Pulmonary Rehab  Date 10/20/22  Educator [facility]  Instruction Review Code 1- Verbalizes Understanding       Education: Exercise Physiology & General Exercise Guidelines: - Group verbal and written instruction with models to review the exercise physiology of the cardiovascular system and associated critical values. Provides general exercise guidelines with specific guidelines to those with heart or lung disease.  Flowsheet Row Cardiac Rehab from 12/15/2022 in [facility] Cardiac and Pulmonary Rehab  Date 12/01/22  Educator [facility]  Instruction Review Code 1- Verbalizes Understanding       Education: Flexibility, Balance, Mind/Body Relaxation: - Group verbal and visual presentation with interactive activity on the components of exercise prescription. Introduces F.I.T.T principle from ACSM for exercise prescriptions. Reviews F.I.T.T. principles of flexibility and balance exercise training including progression. Also discusses the mind body connection.  Reviews various relaxation techniques to help reduce and manage stress (i.e. Deep breathing, progressive muscle relaxation, and visualization). Balance handout provided to take home. Written material given at graduation. Flowsheet Row Cardiac Rehab from 12/15/2022 in [facility] Cardiac and Pulmonary Rehab  Date 12/15/22  Educator [facility]  Instruction Review Code 1- Verbalizes Understanding       Activity Barriers & Risk Stratification:  Activity Barriers & Cardiac Risk Stratification - 10/20/22 1232       Activity  Barriers & Cardiac Risk Stratification   Activity Barriers Left Knee Replacement;Right Knee Replacement    Cardiac Risk Stratification High             6 Minute Walk:  6 Minute Walk     Row Name 10/20/22 1229 12/17/22 0824       6 Minute Walk   Phase Initial Discharge    Distance 1430 feet 1665 feet    Distance % Change -- 16.4 %    Distance Feet Change -- 235 ft    Walk Time 6 minutes 6 minutes    # of Rest Breaks 0 0    MPH 2.71 3.15    METS 3.17 4.1    RPE 17 12    Perceived Dyspnea  3 3    VO2 Peak 11.11 14.37    Symptoms No Yes (comment)    Comments -- SOB    Resting HR 74 bpm 96 bpm    Resting BP 122/80 124/72    Resting Oxygen Saturation  99 % 98 %    Exercise Oxygen Saturation  during 6 min walk 99 % 98 %    Max Ex. HR 118 bpm 135 bpm    Max Ex. BP 160/72 194/84    2 Minute Post BP 128/82 --             Oxygen Initial Assessment:   Oxygen Re-Evaluation:   Oxygen Discharge (Final Oxygen Re-Evaluation):   Initial Exercise Prescription:  Initial Exercise Prescription - 10/20/22 1200       Date of Initial Exercise RX and Referring Provider   Date 10/20/22    Referring Provider Gollan      Oxygen   Maintain Oxygen Saturation 88% or higher      Treadmill   MPH 2.3  Grade 1    Minutes 15    METs 3.08      Arm Ergometer   Level 1    RPM 30    Minutes 15    METs 3.17      REL-XR   Level 2    Speed 50    Minutes 15    METs 3.17      Track   Laps 30    Minutes 15    METs 2.63      Prescription Details   Frequency (times per week) 3    Duration Progress to 30 minutes of continuous aerobic without signs/symptoms of physical distress      Intensity   THRR 40-80% of Max Heartrate 105-136    Ratings of Perceived Exertion 11-13    Perceived Dyspnea 0-4      Progression   Progression Continue to progress workloads to maintain intensity without signs/symptoms of physical distress.      Resistance Training   Training  Prescription Yes    Weight 5    Reps 10-15             Perform Capillary Blood Glucose checks as needed.  Exercise Prescription Changes:   Exercise Prescription Changes     Row Name 10/20/22 1200 11/01/22 1500 11/08/22 0800 11/16/22 1400 11/29/22 1100     Response to Exercise   Blood Pressure (Admit) 122/80 118/62 -- 124/70 124/72   Blood Pressure (Exercise) 160/72 132/74 -- 138/68 --   Blood Pressure (Exit) 120/70 124/70 -- 112/66 126/66   Heart Rate (Admit) 74 bpm 86 bpm -- 87 bpm 70 bpm   Heart Rate (Exercise) 118 bpm 117 bpm -- 127 bpm 123 bpm   Heart Rate (Exit) 91 bpm 82 bpm -- 84 bpm 76 bpm   Oxygen Saturation (Exercise) 99 % -- -- -- --   Oxygen Saturation (Exit) 99 % -- -- -- --   Rating of Perceived Exertion (Exercise) 99 13 -- 14 15   Perceived Dyspnea (Exercise) 3 -- -- -- --   Symptoms none none -- none none   Comments results 3rd full session of exercise -- -- --   Duration -- Progress to 30 minutes of  aerobic without signs/symptoms of physical distress -- Continue with 30 min of aerobic exercise without signs/symptoms of physical distress. Continue with 30 min of aerobic exercise without signs/symptoms of physical distress.   Intensity -- THRR unchanged -- THRR unchanged THRR unchanged     Progression   Progression -- Continue to progress workloads to maintain intensity without signs/symptoms of physical distress. -- Continue to progress workloads to maintain intensity without signs/symptoms of physical distress. Continue to progress workloads to maintain intensity without signs/symptoms of physical distress.   Average METs -- 3.55 -- 3.46 4.09     Resistance Training   Training Prescription -- Yes -- Yes Yes   Weight -- 5 lb -- 5 lb 5 lb   Reps -- 10-15 -- 10-15 10-15     Interval Training   Interval Training -- No -- No No     Treadmill   MPH -- 2.3 -- 2.3 3   Grade -- 1 -- 1.5 2   Minutes -- 15 -- 15 15   METs -- 3.08 -- 3.23 4.12     NuStep    Level -- -- -- -- 3   Minutes -- -- -- -- 15   METs -- -- -- -- 5.4  Arm Ergometer   Level -- -- -- 1 --   Minutes -- -- -- 15 --   METs -- -- -- 1 --     REL-XR   Level -- 9 -- 7 7   Minutes -- 15 -- 15 15   METs -- 3.2 -- 5 5.2     Biostep-RELP   Level -- 5 -- 4 8   Minutes -- 15 -- 15 15   METs -- 4 -- 4 4     Track   Laps -- 9 -- -- 43   Minutes -- 15 -- -- 15   METs -- 1.49 -- -- 3.36     Home Exercise Plan   Plans to continue exercise at -- -- [employer] (comment)  [employer], [employer], walking [employer] (comment)  [employer], [employer], walking [employer] (comment)  [employer], [employer], walking   Frequency -- -- Add 2 additional days to program exercise sessions. Add 2 additional days to program exercise sessions. Add 2 additional days to program exercise sessions.   Initial Home Exercises Provided -- -- 11/08/22 11/08/22 11/08/22     Oxygen   Maintain Oxygen Saturation -- -- -- 88% or higher 88% or higher    Row Name 12/13/22 1700 12/27/22 1300           Response to Exercise   Blood Pressure (Admit) 120/62 124/62      Blood Pressure (Exit) 112/60 122/60      Heart Rate (Admit) 72 bpm 71 bpm      Heart Rate (Exercise) 105 bpm 109 bpm      Heart Rate (Exit) 80 bpm 82 bpm      Rating of Perceived Exertion (Exercise) 15 15      Symptoms none none      Duration Continue with 30 min of aerobic exercise without signs/symptoms of physical distress. Continue with 30 min of aerobic exercise without signs/symptoms of physical distress.      Intensity THRR unchanged THRR unchanged        Progression   Progression Continue to progress workloads to maintain intensity without signs/symptoms of physical distress. Continue to progress workloads to maintain intensity without signs/symptoms of physical distress.      Average METs 5.18 5.2        Resistance Training   Training Prescription Yes Yes      Weight 5 lb 5 lb      Reps 10-15 10-15         Interval Training   Interval Training No No        Treadmill   MPH 2.5 2.5      Grade 2 2      Minutes 15 15      METs 3.6 3.6        Recumbant Bike   Level 3 3      Watts 25 25      Minutes 15 15      METs 4.6 5.2        NuStep   Level 5 4      Minutes 15 15      METs 5.9 6.3        Recumbant Elliptical   Level -- 3      Minutes -- 15        REL-XR   Level 8 8      Minutes 15 15      METs 7 7.2  Biostep-RELP   Level 3 4      Minutes 15 15      METs 4 4        Home Exercise Plan   Plans to continue exercise at [employer] (comment)  [employer], walking [employer] (comment)  [employer], walking      Frequency Add 2 additional days to program exercise sessions. Add 2 additional days to program exercise sessions.      Initial Home Exercises Provided 11/08/22 11/08/22        Oxygen   Maintain Oxygen Saturation 88% or higher 88% or higher               Exercise Comments:   Exercise Comments     Row Name 10/25/22 1013 12/10/22 0837         Exercise Comments First full day of exercise!  Patient was oriented to gym and equipment including functions, settings, policies, and procedures.  Patient's individual exercise prescription and treatment plan were reviewed.  All starting workloads were established based on the results of the 6 minute walk test done at initial orientation visit.  The plan for exercise progression was also introduced and progression will be customized based on patient's performance and goals. [name] states his angina symptoms are improved and that he can go up flight of stairs without getting short of breath               Exercise Goals and Review:   Exercise Goals     Row Name 10/20/22 1238             Exercise Goals   Increase Physical Activity Yes       Intervention Provide advice, education, support and counseling about physical activity/exercise needs.;Develop an individualized exercise  prescription for aerobic and resistive training based on initial evaluation findings, risk stratification, comorbidities and participant's personal goals.       Expected Outcomes Short Term: Attend rehab on a regular basis to increase amount of physical activity.;Long Term: Add in home exercise to make exercise part of routine and to increase amount of physical activity.;Long Term: Exercising regularly at least 3-5 days a week.       Increase Strength and Stamina Yes       Intervention Provide advice, education, support and counseling about physical activity/exercise needs.;Develop an individualized exercise prescription for aerobic and resistive training based on initial evaluation findings, risk stratification, comorbidities and participant's personal goals.       Expected Outcomes Short Term: Increase workloads from initial exercise prescription for resistance, speed, and METs.;Short Term: Perform resistance training exercises routinely during rehab and add in resistance training at home;Long Term: Improve cardiorespiratory fitness, muscular endurance and strength as measured by increased METs and functional capacity ( )       Able to understand and use rate of perceived exertion (RPE) scale Yes       Intervention Provide education and explanation on how to use RPE scale       Expected Outcomes Short Term: Able to use RPE daily in rehab to express subjective intensity level;Long Term:  Able to use RPE to guide intensity level when exercising independently       Able to understand and use Dyspnea scale Yes       Intervention Provide education and explanation on how to use Dyspnea scale       Expected Outcomes Short Term: Able to use Dyspnea scale daily in rehab to express subjective  sense of shortness of breath during exertion;Long Term: Able to use Dyspnea scale to guide intensity level when exercising independently       Knowledge and understanding of Target Heart Rate Range (THRR) Yes        Intervention Provide education and explanation of THRR including how the numbers were predicted and where they are located for reference       Expected Outcomes Short Term: Able to state/look up THRR;Long Term: Able to use THRR to govern intensity when exercising independently;Short Term: Able to use daily as guideline for intensity in rehab       Able to check pulse independently Yes       Intervention Provide education and demonstration on how to check pulse in carotid and radial arteries.;Review the importance of being able to check your own pulse for safety during independent exercise       Expected Outcomes Short Term: Able to explain why pulse checking is important during independent exercise       Understanding of Exercise Prescription Yes       Intervention Provide education, explanation, and written materials on patient's individual exercise prescription       Expected Outcomes Short Term: Able to explain program exercise prescription;Long Term: Able to explain home exercise prescription to exercise independently                Exercise Goals Re-Evaluation :  Exercise Goals Re-Evaluation     Row Name 10/25/22 1013 11/01/22 1516 11/08/22 0823 11/16/22 1448 11/24/22 0754     Exercise Goal Re-Evaluation   Exercise Goals Review Able to understand and use rate of perceived exertion (RPE) scale;Able to understand and use Dyspnea scale;Knowledge and understanding of Target Heart Rate Range (THRR);Understanding of Exercise Prescription Increase Physical Activity;Increase Strength and Stamina;Understanding of Exercise Prescription Increase Physical Activity;Increase Strength and Stamina;Understanding of Exercise Prescription;Able to understand and use rate of perceived exertion (RPE) scale;Knowledge and understanding of Target Heart Rate Range (THRR);Able to check pulse independently Increase Physical Activity;Increase Strength and Stamina;Understanding of Exercise Prescription Increase Physical  Activity;Increase Strength and Stamina;Understanding of Exercise Prescription   Comments Reviewed RPE scale, THR and program prescription with pt today.  Pt voiced understanding and was given a copy of goals to take home. [name] is off to a good start with rehab. He has already increased to fairly high workloads. He is at level 9 on the XR and level 5 on the Biostep. He, however, is limited with walking and was only able to walk 5 minutes on the treadmill and walked 9 laps on the track. We hope to see his walking ability increase over the time of the program. Will continue to monitor as he progresses. Reviewed home exercise with pt today.  Pt plans to walk and go to [facility] and  [employer] for exercise.  Reviewed THR, pulse, RPE, sign and symptoms, pulse oximetery and when to call 911 or MD.  Also discussed weather considerations and indoor options.  Pt voiced understanding. [name] continues to do well in rehab. He had an overall average MET level of 3.46 METs over the past two weeks. He was also able to add a little more incline on the treadmill up to 1.5%, while maintaining a speed of 2.3 mph. He has tolerated 5 lb hand weights for resistance training as well. We will continue to monitor his progress in the program. [name] is doing well in rehab.  He was at the gym last night!  He is aiming to  get there at least 1 extra day a week for three a week total.  He is aiming for 22 min on treadmill at 2.4 mph and then on bike for 15 min and then cool down.  He is feeling like his stamina is starting to improve.   Expected Outcomes Short: Use RPE daily to regulate intensity. Long: Follow program prescription in THR. Short: Continue to increase walking duration on both track and treadmill Long: Increase overall MET level Short: Use guidelines to for intensity during exercise Long: conitnue to improve stamina Short: Continue to increase workloads on seated machines. Long: conitnue to improve strength and stamina. Short; Conitnue  to get to gym on off days Long: Conitnue to improve stamina    Row Name 11/29/22 1200 12/13/22 1755 12/20/22 0810 12/27/22 1334       Exercise Goal Re-Evaluation   Exercise Goals Review Increase Physical Activity;Increase Strength and Stamina;Understanding of Exercise Prescription Increase Physical Activity;Increase Strength and Stamina;Understanding of Exercise Prescription Increase Physical Activity;Increase Strength and Stamina;Understanding of Exercise Prescription Increase Physical Activity;Increase Strength and Stamina;Understanding of Exercise Prescription    Comments [name] continues to do well in rehab. He walked a max of 43 laps on the track! He also increase his workload on the treadmill significantly to a 3 mph/ 2% incline. biostep went up to a level 8. His overall METs increased and he has been hitting his THR most sessions. He could benefit from interval training. Will continue to monitor. [name] continues to do well in rehab. He recently increased his overall average MET level to 5.18 METs. He also was able to improve to level 5 on the T4 and level 8 on the XR. He has been inconsistent with his workload on the treadmill, as he was walking at a speed of 3 mph but came down to 2.5 mph. We will continue to monitor his progress in the program. Patient reports that what used to be hard for him to do physically is starting to feel easier. Exercise progression was discussed with patient on how to evaluate work loads and when to increase. Tasks in his daily life are also reported to feel easier, physically. Patient works out at [employer] on off days of rehab and plans to continue to exercise there upon graduation. [name] is doing well in rehab and is ready to graduate in the next week. He has worked up to 6.3 METS on the T4 Nustep! He also completed his post and improved by 16.4% We will continue to monitor until he graduates.    Expected Outcomes Short: Continue to increase overall laps on track,  staff go over interval patient with patient Long: continue to increase overall MET level Short: Keep consistently increasing treadmill workload. Long: Continue to improve strength and stamina. Patient reports that he will continue to exercise at [facility] gym upon graduation from cardiac rehab. Short: Graduate Long: Continue to exercise independently and process to increase overall MET level and stamina             Discharge Exercise Prescription (Final Exercise Prescription Changes):  Exercise Prescription Changes - 12/27/22 1300       Response to Exercise   Blood Pressure (Admit) 124/62    Blood Pressure (Exit) 122/60    Heart Rate (Admit) 71 bpm    Heart Rate (Exercise) 109 bpm    Heart Rate (Exit) 82 bpm    Rating of Perceived Exertion (Exercise) 15    Symptoms none    Duration Continue with  30 min of aerobic exercise without signs/symptoms of physical distress.    Intensity THRR unchanged      Progression   Progression Continue to progress workloads to maintain intensity without signs/symptoms of physical distress.    Average METs 5.2      Resistance Training   Training Prescription Yes    Weight 5 lb    Reps 10-15      Interval Training   Interval Training No      Treadmill   MPH 2.5    Grade 2    Minutes 15    METs 3.6      Recumbant Bike   Level 3    Watts 25    Minutes 15    METs 5.2      NuStep   Level 4    Minutes 15    METs 6.3      Recumbant Elliptical   Level 3    Minutes 15      REL-XR   Level 8    Minutes 15    METs 7.2      Biostep-RELP   Level 4    Minutes 15    METs 4      Home Exercise Plan   Plans to continue exercise at [employer] (comment)   [employer], walking   Frequency Add 2 additional days to program exercise sessions.    Initial Home Exercises Provided 11/08/22      Oxygen   Maintain Oxygen Saturation 88% or higher             Nutrition:  Target Goals: Understanding of nutrition guidelines, daily  intake of sodium 1500mg , cholesterol 200mg , calories 30% from fat and 7% or less from saturated fats, daily to have 5 or more servings of fruits and vegetables.  Education: All About Nutrition: -Group instruction provided by verbal, written material, interactive activities, discussions, models, and posters to present general guidelines for heart healthy nutrition including fat, fiber, MyPlate, the role of sodium in heart healthy nutrition, utilization of the nutrition label, and utilization of this knowledge for meal planning. Follow up email sent as well. Written material given at graduation. Flowsheet Row Cardiac Rehab from 12/15/2022 in [facility] Cardiac and Pulmonary Rehab  Education need identified 10/20/22       Biometrics:  Pre Biometrics - 10/20/22 1239       Pre Biometrics   Height 5\' 10"  (1.778 m)    Weight 252 lb 9.6 oz (114.6 kg)    Waist Circumference 50 inches    Hip Circumference 51.5 inches    Waist to Hip Ratio 0.97 %    BMI (Calculated) 36.24    Single Leg Stand 4.06 seconds             Post Biometrics - 12/17/22 0825        Post  Biometrics   Height 5' 10.25" (1.784 m)    Weight 255 lb (115.7 kg)    Waist Circumference 45 inches    Hip Circumference 48.5 inches    Waist to Hip Ratio 0.93 %    BMI (Calculated) 36.34    Single Leg Stand 7.2 seconds             Nutrition Therapy Plan and Nutrition Goals:  Nutrition Therapy & Goals - 10/26/22 0802       Nutrition Therapy   Diet Heart healthy, low Na, T2DM    Drug/Food Interactions Statins/Certain Fruits    Protein (specify units)  85-95g    Fiber 33 grams    Whole Grain Foods 3 servings    Saturated Fats 18 max. grams    Fruits and Vegetables 8 servings/day    Sodium 2 grams      Personal Nutrition Goals   Nutrition Goal ST: practice MyPlate guidelines, begin by adding in fiber rich foods to meals/snacks LT: limit saturated fat <18g/day, limit Na <2g/day, maintain A1C <7, include 33g fiber/day     Comments [name] admitted to cardiac rehab with chronic stable angina. PMHx includes CAD, HLD, HTN, OSA, T2DM, GERD, anxiety, TIA, chronic constipation. PSHX includes CABG (2014), bilateral TKA. Relevant medications includes adderall, lipitor, fluticasone-salmeterol inhaler, metformin, rabeprazole, tradjenta, trazodone, venlafaxine, zetia. Last A1C 03/18/22 was 7.3. showed PYP Score: 44. Vegetables & Fruits 3/12. Breads, Grains & Cereals 4/12. Red & Processed Meat 7/12. Poultry 2/2. Fish & Shellfish 0/4. Beans, Nuts & Seeds 2/4. Milk & Dairy Foods 3/6. Toppings, Oils, Seasonings & Salt 13/20. Sweets, Snacks & Restaurant Food 4/14. Beverages 6/10. B; does not usually eat breakfast. L around 1-2pm: usually something light and he will take his mom out to lunch (skids: burger with fries, biscuit with gravy) D: his wife cooks every night (hamburger or chicken on grill) with sometimes a baked potato. He reports not eating as much as he used to; he reports that he used to weigh 300lbs. He reports a lot of stress from his 8 y.o. mother as she has alzheimer's. Diet and caffiene free soda and some water as well as sweet tea. He reports the stress will cause him to snack like ice cream, peanut butter cracker, candy bars sometimes, cashews or peanuts. He uses whole wheat bread, but eats bread less often. He reports his wife follows a keto diet. Discussed heart healthy eating, T2DM MNT.      Intervention Plan   Intervention Prescribe, educate and counsel regarding individualized specific dietary modifications aiming towards targeted core components such as weight, hypertension, lipid management, diabetes, heart failure and other comorbidities.    Expected Outcomes Short Term Goal: Understand basic principles of dietary content, such as calories, fat, sodium, cholesterol and nutrients.;Short Term Goal: A plan has been developed with personal nutrition goals set during dietitian appointment.;Long Term Goal: Adherence to  prescribed nutrition plan.             Nutrition Assessments:  MEDIFICTS Score Key: ?70 Need to make dietary changes  40-70 Heart Healthy Diet ? 40 Therapeutic Level Cholesterol Diet  Flowsheet Row Cardiac Rehab from 12/20/2022 in [facility] Cardiac and Pulmonary Rehab  Picture Your Plate Total Score on Discharge 48      Picture Your Plate Scores: <94 Unhealthy dietary pattern with much room for improvement. 41-50 Dietary pattern unlikely to meet recommendations for good health and room for improvement. 51-60 More healthful dietary pattern, with some room for improvement.  >60 Healthy dietary pattern, although there may be some specific behaviors that could be improved.    Nutrition Goals Re-Evaluation:  Nutrition Goals Re-Evaluation     Row Name 11/08/22 0820 11/24/22 0759 12/20/22 0827         Goals   Nutrition Goal ST: practice MyPlate guidelines, begin by adding in fiber rich foods to meals/snacks LT: limit saturated fat <18g/day, limit Na <2g/day, maintain A1C <7, include 33g fiber/day Short: Stick to diet Long; Continue to focus on heart healthy eating --     Comment [name] is doing well in rehab. He is frustrated that his weight is  not budging.  He is eating fiber and getting enough protein.  He has been able to cut back on his sodium intake too.  He is feeling confident in what he is eating.  We encourged him to stick with it. [name] is doing well with his diet.  He is really doing better with portion control and watching what he eats.  He has also been working to cut out his snacking between meals. [name] continues to work on healthy eating habits and will continue to do so upon graduation from program.     Expected Outcome Short: Stick to diet Long; Continue to focus on heart healthy eating Short: Continue to work on portion size Long: COnitnue to eat healthier Upon graduation continue to eat a heart helathy diet.              Nutrition Goals Discharge (Final Nutrition  Goals Re-Evaluation):  Nutrition Goals Re-Evaluation - 12/20/22 0827       Goals   Comment Sabri continues to work on healthy eating habits and will continue to do so upon graduation from program.    Expected Outcome Upon graduation continue to eat a heart helathy diet.             Psychosocial: Target Goals: Acknowledge presence or absence of significant depression and/or stress, maximize coping skills, provide positive support system. Participant is able to verbalize types and ability to use techniques and skills needed for reducing stress and depression.   Education: Stress, Anxiety, and Depression - Group verbal and visual presentation to define topics covered.  Reviews how body is impacted by stress, anxiety, and depression.  Also discusses healthy ways to reduce stress and to treat/manage anxiety and depression.  Written material given at graduation. Flowsheet Row [facility]  Date 11/24/22  [facility]  Instruction Review Code 1- [employer]bVerbalizes Understanding       Education: Sleep Hygiene -Provides group verbal and written instruction about how sleep can affect your health.  Define sleep hygiene, discuss sleep cycles and impact of sleep habits. Review good sleep hygiene tips.    Initial Review & Psychosocial Screening:  Initial Psych Review & Screening - 10/12/22 1309       Initial Review   Current issues with Current Depression;Current Anxiety/Panic;Current Stress Concerns    Source of Stress Concerns Family      Family Dynamics   Good Support System? Yes   wife, sisters   Comments mother has dementia (589years old)      Barriers   Psychosocial barriers to participate in program There are no identifiable barriers or psychosocial needs.;The patient should benefit from training in stress management and relaxation.      Screening Interventions   Interventions Encouraged to exercise;Provide feedback about the scores to  participant;To provide support and resources with identified psychosocial needs    Expected Outcomes Short Term goal: Utilizing psychosocial counselor, staff and physician to assist with identification of specific Stressors or current issues interfering with healing process. Setting desired goal for each stressor or current issue identified.;Long Term Goal: Stressors or current issues are controlled or eliminated.;Short Term goal: Identification and review with participant of any Quality of Life or Depression concerns found by scoring the questionnaire.;Long Term goal: The participant improves quality of Life and PHQ9 Scores as seen by post scores and/or verbalization of changes             Quality of Life Scores:   Quality  of Life - 12/20/22 0809       Quality of Life Scores   Health/Function Pre 13.33 %    Health/Function Post 20.4 %    Health/Function % Change 53.04 %    Socioeconomic Pre 18.38 %    Socioeconomic Post 23.36 %    Socioeconomic % Change  27.09 %    Psych/Spiritual Pre 20.29 %    Psych/Spiritual Post 21.93 %    Psych/Spiritual % Change 8.08 %    Family Pre 16.1 %    Family Post 22.5 %    Family % Change 39.75 %    GLOBAL Pre 16.27 %    GLOBAL Post 21.63 %    GLOBAL % Change 32.94 %            Scores of 19 and below usually indicate a poorer quality of life in these areas.  A difference of  2-3 points is a clinically meaningful difference.  A difference of 2-3 points in the total score of the Quality of Life Index has been associated with significant improvement in overall quality of life, self-image, physical symptoms, and general health in studies assessing change in quality of life.  PHQ-9: Review Flowsheet       12/20/2022 11/08/2022 10/20/2022  Depression screen PHQ 2/9  Decreased Interest 2 0 1  Down, Depressed, Hopeless 2 0 1  PHQ - 2 Score 4 0 2  Altered sleeping 1 1 1   Tired, decreased energy 2 1 2   Change in appetite 2 0 0  Feeling bad or  failure about yourself  3 0 2  Trouble concentrating 0 0 0  Moving slowly or fidgety/restless 1 0 0  Suicidal thoughts 2 0 0  PHQ-9 Score 15 2 7   Difficult doing work/chores Somewhat difficult Somewhat difficult Somewhat difficult   Interpretation of Total Score  Total Score Depression Severity:  1-4 = Minimal depression, 5-9 = Mild depression, 10-14 = Moderate depression, 15-19 = Moderately severe depression, 20-27 = Severe depression   Psychosocial Evaluation and Intervention:  Psychosocial Evaluation - 10/12/22 1325       Psychosocial Evaluation & Interventions   Interventions Encouraged to exercise with the program and follow exercise prescription;Stress management education;Relaxation education    Comments [name] is coming to cardiac rehab with the diagnosis of stabel angina. He has done the program before after a stent and CABG. He has noticed his stamina is not like it used to be. He has been taking his medications as he should and is aware he needs to increase his exercise to help improve his breathing. When asked about depression, anxiety, and stress he reported he has been experiencing all three. He is in communication with his doctor regarding these concerns. His main source of stress is caring for his 77 year old mother who has dementia. She lives 2 miles up the road so he is close by. His sisters also help out when they can. He wants to come to the program to boost his stamina and get back on track with exercise.    Expected Outcomes Short: attend cardiac rehab for education and exercise. Long: develop and maintain positive self care habits.    Continue Psychosocial Services  Follow up required by staff             Psychosocial Re-Evaluation:  Psychosocial Re-Evaluation     [name] Name 11/08/22 0817 11/24/22 0756 12/20/22 5009         Psychosocial Re-Evaluation   Current issues with  Current Stress Concerns Current Stress Concerns Current Stress Concerns     Comments [name]  is doing well in rehab.  His PHQ has improved greatly over the last month. He is moving better and no longer gasping like he was earlier.  He is sleeping well for the most part.  His wife and daughter both have COVID currently but he is still doing well.  He continues to care for his mother.  She is currently mad at him for not coming to visit with everyone being sick. He is going to go by today with his mask on. [name] is doing well in rehab.  His continues to worry over his mom who is 61 with dementia.  She was driving around looking for a buidling that no longer exsisted.  He talked a while with her yesterday, but knows she won't remember. He is sleeping better now that he is exercising on a regular basis.  He finds days he exercises he sleeps better. [name] reports that there are no new changes in his sleep or mental health. He reports that he is trying to "go with the flow" of life better but some days still struggles with feeling down. His PHQ did indicate depression but when asked he stated that he has no plans to harm himself.     Expected Outcomes Short: Conitnue to exercise for mental bosot Long: Conitnue to stay positive Short: continue to sleep well and cope with mother Long: Continue to exercise for mental boost Continue to exercise for mental boost and maintain good mental health habits upon graduation.     Interventions Encouraged to attend Cardiac Rehabilitation for the exercise Encouraged to attend Cardiac Rehabilitation for the exercise;Stress management education Encouraged to attend Cardiac Rehabilitation for the exercise;Stress management education     Continue Psychosocial Services  Follow up required by staff Follow up required by staff No Follow up required              Psychosocial Discharge (Final Psychosocial Re-Evaluation):  Psychosocial Re-Evaluation - 12/20/22 5277       Psychosocial Re-Evaluation   Current issues with Current Stress Concerns    Comments [name] reports  that there are no new changes in his sleep or mental health. He reports that he is trying to "go with the flow" of life better but some days still struggles with feeling down. His PHQ did indicate depression but when asked he stated that he has no plans to harm himself.    Expected Outcomes Continue to exercise for mental boost and maintain good mental health habits upon graduation.    Interventions Encouraged to attend Cardiac Rehabilitation for the exercise;Stress management education    Continue Psychosocial Services  No Follow up required             Vocational Rehabilitation: Provide vocational rehab assistance to qualifying candidates.   Vocational Rehab Evaluation & Intervention:  Vocational Rehab - 10/12/22 1309       Initial Vocational Rehab Evaluation & Intervention   Assessment shows need for Vocational Rehabilitation No             Education: Education Goals: Education classes will be provided on a variety of topics geared toward better understanding of heart health and risk factor modification. Participant will state understanding/return demonstration of topics presented as noted by education test scores.  Learning Barriers/Preferences:  Learning Barriers/Preferences - 10/12/22 1308       Learning Barriers/Preferences   Learning Barriers Hearing    Learning  Preferences Individual Instruction             General Cardiac Education Topics:  AED/CPR: - Group verbal and written instruction with the use of models to demonstrate the basic use of the AED with the basic ABC's of resuscitation. 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 Reviews the testing methods  done to diagnose heart disease and the outcomes of the test results. Describes the treatment choices: Medical Management, Angioplasty, or Coronary Bypass Surgery for treating various heart conditions including Myocardial Infarction, Angina, Valve Disease, and Cardiac Arrhythmias.  Written material given at graduation. Flowsheet Row Cardiac Rehab from 12/15/2022 in [facility] Cardiac and Pulmonary Rehab  Education need identified 10/20/22  Date 10/27/22  Educator [place]  Instruction Review Code 1- Verbalizes Understanding       Medication Safety: - Group verbal and visual instruction to review commonly prescribed medications for heart and lung disease. Reviews the medication, class of the drug, and side effects. Includes the steps to properly store meds and maintain the prescription regimen.  Written material given at graduation. Flowsheet Row Cardiac Rehab from 12/15/2022 in [facility] Cardiac and Pulmonary Rehab  Date 11/03/22  Educator SB  Instruction Review Code 1- Verbalizes Understanding       Intimacy: - Group verbal instruction through game format to discuss how heart and lung disease can affect sexual intimacy. Written material given at graduation.. Flowsheet Row Cardiac Rehab from 12/15/2022 in [facility] Cardiac and Pulmonary Rehab  Date 12/08/22  Educator [facility]  Instruction Review Code 1- Verbalizes Understanding       Know Your Numbers and Heart Failure: - Group verbal and visual instruction to discuss disease risk factors for cardiac and pulmonary disease and treatment options.  Reviews associated critical values for Overweight/Obesity, Hypertension, Cholesterol, and Diabetes.  Discusses basics of heart failure: signs/symptoms and treatments.  Introduces Heart Failure Zone chart for action plan for heart failure.  Written material given at graduation. Flowsheet Row Cardiac Rehab from 12/15/2022 in [facility] Cardiac and Pulmonary Rehab  Date 11/10/22  Educator SB  Instruction Review Code 1- Verbalizes  Understanding       Infection Prevention: - Provides verbal and written material to individual with discussion of infection control including proper hand washing and proper equipment cleaning during exercise session. Flowsheet Row Cardiac Rehab from 12/15/2022 in [facility] Cardiac and Pulmonary Rehab  Date 10/20/22  Educator [facility]  Instruction Review Code 1- Verbalizes Understanding       Falls Prevention: - Provides verbal and written material to individual with discussion of falls prevention and safety. Flowsheet Row Cardiac Rehab from 12/15/2022 in [facility] Cardiac and Pulmonary Rehab  Date 10/20/22  Educator [facility]  Instruction Review Code 1- Verbalizes Understanding       Other: -Provides group and verbal instruction on various topics (see comments)   Knowledge Questionnaire Score:  Knowledge Questionnaire Score - 12/20/22 5573       Knowledge Questionnaire Score   Pre Score 23/26    Post Score 26/26             Core Components/Risk Factors/Patient Goals at Admission:  Personal Goals and Risk Factors at Admission - 10/20/22 1241       Core Components/Risk Factors/Patient Goals on Admission  Weight Management Yes    Intervention Weight Management: Develop a combined nutrition and exercise program designed to reach desired caloric intake, while maintaining appropriate intake of nutrient and fiber, sodium and fats, and appropriate energy expenditure required for the weight goal.;Weight Management: Provide education and appropriate resources to help participant work on and attain dietary goals.;Weight Management/Obesity: Establish reasonable short term and long term weight goals.    Admit Weight 252 lb 9.6 oz (114.6 kg)    Goal Weight: Short Term 245 lb (111.1 kg)    Goal Weight: Long Term 220 lb (99.8 kg)    Expected Outcomes Short Term: Continue to assess and modify interventions until short term weight is achieved;Long Term: Adherence to nutrition and physical  activity/exercise program aimed toward attainment of established weight goal;Weight Loss: Understanding of general recommendations for a balanced deficit meal plan, which promotes 1-2 lb weight loss per week and includes a negative energy balance of [phone] kcal/d;Understanding recommendations for meals to include 15-35% energy as protein, 25-35% energy from fat, 35-60% energy from carbohydrates, less than 200mg  of dietary cholesterol, 20-35 gm of total fiber daily;Understanding of distribution of calorie intake throughout the day with the consumption of 4-5 meals/snacks    Improve shortness of breath with ADL's Yes    Intervention Provide education, individualized exercise plan and daily activity instruction to help decrease symptoms of SOB with activities of daily living.    Expected Outcomes Short Term: Improve cardiorespiratory fitness to achieve a reduction of symptoms when performing ADLs;Long Term: Be able to perform more ADLs without symptoms or delay the onset of symptoms    Increase knowledge of respiratory medications and ability to use respiratory devices properly  Yes    Intervention Provide education and demonstration as needed of appropriate use of medications, inhalers, and oxygen therapy.    Expected Outcomes Short Term: Achieves understanding of medications use. Understands that oxygen is a medication prescribed by physician. Demonstrates appropriate use of inhaler and oxygen therapy.;Long Term: Maintain appropriate use of medications, inhalers, and oxygen therapy.    Diabetes Yes    Intervention Provide education about signs/symptoms and action to take for hypo/hyperglycemia.;Provide education about proper nutrition, including hydration, and aerobic/resistive exercise prescription along with prescribed medications to achieve blood glucose in normal ranges: Fasting glucose 65-99 mg/dL    Expected Outcomes Short Term: Participant verbalizes understanding of the signs/symptoms and immediate  care of hyper/hypoglycemia, proper foot care and importance of medication, aerobic/resistive exercise and nutrition plan for blood glucose control.;Long Term: Attainment of HbA1C < 7%.    Hypertension Yes    Intervention Provide education on lifestyle modifcations including regular physical activity/exercise, weight management, moderate sodium restriction and increased consumption of fresh fruit, vegetables, and low fat dairy, alcohol moderation, and smoking cessation.;Monitor prescription use compliance.    Expected Outcomes Short Term: Continued assessment and intervention until BP is < 140/6090mm HG in hypertensive participants. < 130/4380mm HG in hypertensive participants with diabetes, heart failure or chronic kidney disease.;Long Term: Maintenance of blood pressure at goal levels.    Lipids Yes    Intervention Provide education and support for participant on nutrition & aerobic/resistive exercise along with prescribed medications to achieve LDL 70mg , HDL >40mg .    Expected Outcomes Short Term: Participant states understanding of desired cholesterol values and is compliant with medications prescribed. Participant is following exercise prescription and nutrition guidelines.;Long Term: Cholesterol controlled with medications as prescribed, with individualized exercise RX and with personalized nutrition plan. Value goals: LDL < 70mg , HDL > 40  mg.             Education:Diabetes - Individual verbal and written instruction to review signs/symptoms of diabetes, desired ranges of glucose level fasting, after meals and with exercise. Acknowledge that pre and post exercise glucose checks will be done for 3 sessions at entry of program. Flowsheet Row [facility]  Date 10/20/22  [facility]  Instruction Review Code 1- Verbalizes Understanding       Core Components/Risk Factors/Patient Goals Review:   Goals and Risk Factor Review     Row Name  11/08/22 [id number] 11/24/22 0800 12/20/22 0826         Core Components/Risk Factors/Patient Goals Review   Personal Goals Review Weight Management/Obesity;Hypertension;Diabetes;Lipids;Improve shortness of breath with ADL's Weight Management/Obesity;Hypertension;Diabetes;Lipids;Improve shortness of breath with ADL's Weight Management/Obesity;Hypertension;Diabetes;Lipids;Improve shortness of breath with ADL's     Review [name] is doing well in rehab. He is frustrated that his weight is not moving but plans to stick to his diet.  His pressures are doing well and he has been checking them at home.  His blood sugars are staying between 120-130 each day!!  He is also doing more now without the SOB! [name] is doing well.  His weight is still not budging.  His doctor offered ozempic or wengovia but he wanted to finish rehab first.  We talked about adding in another day of exercise could help with calorie burn or to add in more weight lifting at home as he has not been doing any of that thus far.  His breathing is getting better and he is able to do more at home.  His pressures are doing well and so are his sugars. [name] reports that he continues to take all medications and monitor risk factors. He continues to monitor weight at home.     Expected Outcomes Short: Continue to exercise for weight loss Long; Conitnue to monitor risk factors SHort: Try adding in resistance training at home to help with weight loss Long: Continue to montior risk factors. Patient will continue to take all meds, exercise, and continue with lifestyle changes upon graduation.              Core Components/Risk Factors/Patient Goals at Discharge (Final Review):   Goals and Risk Factor Review - 12/20/22 0826       Core Components/Risk Factors/Patient Goals Review   Personal Goals Review Weight Management/Obesity;Hypertension;Diabetes;Lipids;Improve shortness of breath with ADL's    Review [name] reports that he continues to take all  medications and monitor risk factors. He continues to monitor weight at home.    Expected Outcomes Patient will continue to take all meds, exercise, and continue with lifestyle changes upon graduation.             ITP Comments:  ITP Comments     Row Name 10/12/22 1324 10/20/22 1229 10/25/22 1013 10/26/22 0913 11/17/22 1049   ITP Comments Initial phone call completed. [facility] EP Orientation scheduled for Wednesday 11/29 at 10:30. Completed 6MWT and gym orientation. Initial ITP created and sent for review to Dr. Bethann PunchesMark Miller, Medical Director. First full day of exercise!  Patient was oriented to gym and equipment including functions, settings, policies, and procedures.  Patient's individual exercise prescription and treatment plan were reviewed.  All starting workloads were established based on the results of the 6 minute walk test done at initial orientation visit.  The plan for exercise progression was also  introduced and progression will be customized based on patient's performance and goals. Completed initial RD consultation 30 Day review completed. Medical Director ITP review done, changes made as directed, and signed approval by Medical Director.    Row Name 12/06/22 0825 12/10/22 0837 12/15/22 0959 12/29/22 0818     ITP Comments [name] states that he felt his right knee pop. He was walking on the treadmill and felt it. He immediatly stopped. He feels at this time he should go home, elevate and ice it. Informed patient to call if it is any worse over the next day or two. Patient also informed not to come to rehab if it he is to much pain. [name] states his angina symptoms are improved and that he can go up flight of stairs without getting short of breath.   He did reach out to his ortho doctor.  was advised to not  do much weight bearing activity for a couple weeks. 30 Day review completed. Medical Director ITP review done, changes made as directed, and signed approval  by Medical Director. [name] graduated today from  rehab with 36 sessions completed.  Details of the patient's exercise prescription and what He needs to do in order to continue the prescription and progress were discussed with patient.  Patient was given a copy of prescription and goals.  Patient verbalized understanding.  [name] plans to continue to exercise by [employer], and walking.             Comments: Discharge ITP

## 2023-04-04 NOTE — Progress Notes (Unsigned)
Cardiology Office Note  Date:  04/05/2023   ID:  [name], DOB 1956/10/11, MRN [id number]  PCP:  Lee Baseman, MD   Chief Complaint  Patient presents with   6 month follow up     Patient c/o shortness of breath and occasional chest pain & is under a lot of stress with taking care of his mother with dementia & she recently had a stroke. Medications reviewed by the patient verbally.     HPI:  [name] is a 67 year-old gentleman with obesity,  coronary artery disease, stenting to his RCA and circumflex in 2010,  CABG March 2014 with vein graft to the OM, LIMA to the LAD,  Postop atrial fib hyperlipidemia,  obstructive sleep apnea with compliance of his CPAP,  chronic left-sided chest pain and shortness of breath  diabetes,  esophageal spasm, bulging disc in his neck, GERD Chronic orthostasis, etiology unclear, previously on midodrine covid x 2 infections,  1/22 and 01/2021 positive who presents for routine follow-up of his coronary artery disease, CABG, SOB  Last seen by myself in clinic November 2023 Recent stress, mom with CVA currently in the hospital, some sundowning  Tries to stay active, does Garden work Denies any chest pain or shortness of breath concerning for angina  Lab work reviewed from last year, no recent lab work this year A1C 7.3 Total  chol 169, LDl 105 Previously declined PCSK9 inhibitor  Weight elevated with stable  Prior workup reviewed Stress test May 2023 No ischemia, normal ejection fraction  Echocardiogram June 2023 Normal left and right ventricular size and function No significant valve disease  EKG personally reviewed by myself on todays visit NSR rate 79 bpm with no ST or T wave changes   Lab Results  Component Value Date   CHOL  11/19/2009    114        ATP III CLASSIFICATION:  <200     mg/dL   Desirable  109-323  mg/dL   Borderline High  >=557    mg/dL   High          HDL 29 (L) 11/19/2009   LDLCALC  11/19/2009     70        Total Cholesterol/HDL:CHD Risk Coronary Heart Disease Risk Table                     Men   Women  1/2 Average Risk   3.4   3.3  Average Risk       5.0   4.4  2 X Average Risk   9.6   7.1  3 X Average Risk  23.4   11.0        Use the calculated Patient Ratio above and the CHD Risk Table to determine the patient's CHD Risk.        ATP III CLASSIFICATION (LDL):  <100     mg/dL   Optimal  322-025  mg/dL   Near or Above                    Optimal  130-159  mg/dL   Borderline  427-062  mg/dL   High  >376     mg/dL   Very High   TRIG 75 28/31/5176   Other past medical history  October 31 2015 having episode of near syncope, got up from a recliner, felt very lightheaded, recovered without syncope One episode approximately 10 days prior to that, getting up from  a lawnmower to a upright position, had near-syncope syncope At that time , midodrine up to 10 mg, Florinef 0.1 mg every other day   Previous orthostasis numbers/blood pressure numbers at home  Showed drops in blood pressure with standing sometimes 80 systolic  Previous episode of falling down or falling into people at a baseball game    He is on disability for his exercise intolerance Previously reported having lightheaded spells getting out of the car. Lasix dosing was decreased    PMH:   has a past medical history of Anxiety, Atherosclerosis of coronary artery bypass graft with angina pectoris (HCC), Barrett's esophagus, Benign enlargement of prostate, Chronic constipation, Colon polyp, Coronary artery disease, COVID-19, Depression, GERD (gastroesophageal reflux disease), H/O esophageal spasm, Hyperlipidemia, Hypertension, MI (myocardial infarction) (HCC) (2011 & 2014), Post-COVID chronic cough, Sleep apnea, TIA (transient ischemic attack), Type 2 diabetes mellitus (HCC), and Wears hearing aid in both ears.  PSH:    Past Surgical History:  Procedure Laterality Date   abscess cellulitis resection      ANAL FISSURE  REPAIR     CARDIAC CATHETERIZATION  2011   CATARACT EXTRACTION W/PHACO Left 12/30/2021   Procedure: CATARACT EXTRACTION PHACO AND INTRAOCULAR LENS PLACEMENT (IOC) LEFT DIABETIC;  Surgeon: Lee Mola, MD;  Location: [facility];  Service: Ophthalmology;  Laterality: Left;  Diabetic 2.75 00:34.3   CATARACT EXTRACTION W/PHACO Right 01/13/2022   Procedure: CATARACT EXTRACTION PHACO AND INTRAOCULAR LENS PLACEMENT (IOC) RIGHT DIABETIC;  Surgeon: Lee Mola, MD;  Location: [facility];  Service: Ophthalmology;  Laterality: Right;  Diabetic 5.34 00:56.7   COLONOSCOPY     COLONOSCOPY WITH PROPOFOL N/A 06/20/2019   Procedure: COLONOSCOPY WITH PROPOFOL;  Surgeon: Toledo, Boykin Nearing, MD;  Location: [facility];  Service: Gastroenterology;  Laterality: N/A;   CORONARY ANGIOPLASTY WITH STENT PLACEMENT  2011   stent placement x 3 @ [facility]; [name]   CORONARY ARTERY BYPASS GRAFT  2014   CABG x 2   ESOPHAGOGASTRODUODENOSCOPY (EGD) WITH PROPOFOL N/A 06/20/2019   Procedure: ESOPHAGOGASTRODUODENOSCOPY (EGD) WITH PROPOFOL;  Surgeon: Toledo, Boykin Nearing, MD;  Location: [facility];  Service: Gastroenterology;  Laterality: N/A;   EXCISION MASS NECK  1985   faliculitis, incision became infected and was in hospital for 21 days   MOUTH SURGERY  2019   PARTIAL KNEE ARTHROPLASTY Right 07/19/2017   Procedure: UNICOMPARTMENTAL KNEE;  Surgeon: Lee Flake, MD;  Location: [facility];  Service: Orthopedics;  Laterality: Right;   TEE WITH CARDIOVERSION     TOTAL KNEE ARTHROPLASTY Right 10/23/2018   Procedure: CONVERSION OF PARTIAL TO TOTAL KNEE ARTHROPLASTY;  Surgeon: Lee Flake, MD;  Location: [facility];  Service: Orthopedics;  Laterality: Right;   TOTAL KNEE ARTHROPLASTY Left 01/10/2020   Procedure: TOTAL KNEE ARTHROPLASTY;  Surgeon: Lee Flake, MD;  Location: [facility];  Service: Orthopedics;  Laterality: Left;    Current Outpatient Medications  Medication Sig Dispense Refill    acetaminophen (TYLENOL) 325 MG tablet Take 650 mg by mouth every 6 (six) hours as needed for moderate pain or headache.      albuterol (PROVENTIL HFA;VENTOLIN HFA) 108 (90 BASE) MCG/ACT inhaler Inhale 1-2 puffs into the lungs every 6 (six) hours as needed for wheezing or shortness of breath.      alfuzosin (UROXATRAL) 10 MG 24 hr tablet Take 10 mg by mouth daily.     Amphetamine-Dextroamphetamine (ADDERALL PO) Take by mouth as needed.     ASPIRIN 81 PO Take by mouth daily.  atorvastatin (LIPITOR) 40 MG tablet Take 40 mg by mouth daily.      cetirizine (ZYRTEC) 10 MG tablet Take 10 mg by mouth daily as needed for allergies.      diphenhydrAMINE (BENADRYL) 50 MG capsule Take 50 mg by mouth at bedtime as needed for sleep.     ezetimibe (ZETIA) 10 MG tablet TAKE 1 TABLET BY MOUTH EVERY DAY 90 tablet 2   fluticasone (FLONASE) 50 MCG/ACT nasal spray Place into the nose.     fluticasone-salmeterol (ADVAIR HFA) 115-21 MCG/ACT inhaler Inhale 2 puffs into the lungs 2 (two) times daily. 1 Inhaler 6   losartan (COZAAR) 50 MG tablet Take 50 mg by mouth daily.     metFORMIN (GLUCOPHAGE) 850 MG tablet Take 850 mg by mouth daily with breakfast.      metoprolol succinate (TOPROL-XL) 25 MG 24 hr tablet Take by mouth.     mirabegron ER (MYRBETRIQ) 25 MG TB24 tablet      nitroGLYCERIN (NITROSTAT) 0.4 MG SL tablet Place 1 tablet (0.4 mg total) under the tongue every 5 (five) minutes as needed for chest pain. 25 tablet 3   RABEprazole (ACIPHEX) 20 MG tablet Take 20 mg by mouth daily.      TRADJENTA 5 MG TABS tablet Take 5 mg by mouth daily.     traZODone (DESYREL) 100 MG tablet TAKE 1/2 TO 1 TABLET BY MOUTH NIGHTLY.     venlafaxine XR (EFFEXOR-XR) 75 MG 24 hr capsule Take 225 mg by mouth daily.     No current facility-administered medications for this visit.     Allergies:   Oxycodone, Hydrocodone, and Tramadol   Social History:  The patient  reports that he has never smoked. He has never used smokeless  tobacco. He reports that he does not drink alcohol and does not use drugs.   Family History:   family history includes Hyperlipidemia in his father and mother; Hypertension in his father and mother; Stroke in his mother.   Review of Systems  Constitutional:  Positive for malaise/fatigue.  HENT: Negative.    Respiratory:  Positive for shortness of breath.   Cardiovascular:  Positive for chest pain.  Gastrointestinal: Negative.   Musculoskeletal: Negative.   Neurological: Negative.   Psychiatric/Behavioral: Negative.    All other systems reviewed and are negative.   PHYSICAL EXAM: VS:  BP 118/66 (BP Location: Left Arm, Patient Position: Sitting, Cuff Size: Normal)   Pulse 79   Ht 5\' 10"  (1.778 m)   Wt 256 lb 4 oz (116.2 kg)   SpO2 99%   BMI 36.77 kg/m  , BMI Body mass index is 36.77 kg/m. Constitutional:  oriented to person, place, and time. No distress.  HENT:  Head: Grossly normal Eyes:  no discharge. No scleral icterus.  Neck: No JVD, no carotid bruits  Cardiovascular: Regular rate and rhythm, no murmurs appreciated Pulmonary/Chest: Clear to auscultation bilaterally, no wheezes or rails Abdominal: Soft.  no distension.  no tenderness.  Musculoskeletal: Normal range of motion Neurological:  normal muscle tone. Coordination normal. No atrophy Skin: Skin warm and dry Psychiatric: normal affect, pleasant  Recent Labs: No results found for requested labs within last 365 days.    Lipid Panel Reviewed through care everywhere  Wt Readings from Last 3 Encounters:  04/05/23 256 lb 4 oz (116.2 kg)  12/17/22 255 lb (115.7 kg)  10/20/22 252 lb 9.6 oz (114.6 kg)     ASSESSMENT AND PLAN:  Coronary artery disease with chronic stable angina Currently  with no symptoms of angina. No further workup at this time. Continue current medication regimen.  Hyperlipidemia, unspecified hyperlipidemia type  He prefers to continue Zetia with Lipitor Prior lipids above goal, would  recommend calorie restriction, reevaluation of new cholesterol numbers this year  Orthostatic hypotension - Plan: EKG 12-Lead  not on midodrine, blood pressure stable Denies near syncope or orthostasis on today's visit  Shortness of breath -  Exacerbated by deconditioning, morbid obesity, chronic stable angina He has completed echo and stress testing which was unrevealing Recommend regular walking program, calorie restriction  S/P CABG x 2 - Plan: EKG 12-Lead Denies anginal symptoms, no further testing  Diabetes type 2 with complications A1c 7.3, new lab work with primary care this year Calorie restriction   Total encounter time more than 30 minutes  Greater than 50% was spent in counseling and coordination of care with the patient   No orders of the defined types were placed in this encounter.    Signed, Dossie Arbour, M.D., Ph.D. 04/05/2023  [facility] [place], [place] [phone]

## 2023-04-05 ENCOUNTER — Encounter: Payer: Self-pay | Admitting: Cardiovascular Disease

## 2023-04-05 ENCOUNTER — Ambulatory Visit: Payer: PPO | Attending: Cardiovascular Disease | Admitting: Cardiovascular Disease

## 2023-04-05 VITALS — BP 118/66 | HR 79 | Ht 70.0 in | Wt 256.2 lb

## 2023-04-05 DIAGNOSIS — I951 Orthostatic hypotension: Secondary | ICD-10-CM | POA: Diagnosis not present

## 2023-04-05 DIAGNOSIS — E782 Mixed hyperlipidemia: Secondary | ICD-10-CM

## 2023-04-05 DIAGNOSIS — I2089 Other forms of angina pectoris: Secondary | ICD-10-CM

## 2023-04-05 DIAGNOSIS — Z7984 Long term (current) use of oral hypoglycemic drugs: Secondary | ICD-10-CM | POA: Diagnosis not present

## 2023-04-05 DIAGNOSIS — E1159 Type 2 diabetes mellitus with other circulatory complications: Secondary | ICD-10-CM

## 2023-04-05 DIAGNOSIS — R0789 Other chest pain: Secondary | ICD-10-CM

## 2023-04-05 DIAGNOSIS — I25118 Atherosclerotic heart disease of native coronary artery with other forms of angina pectoris: Secondary | ICD-10-CM

## 2023-04-05 NOTE — Patient Instructions (Signed)
Medication Instructions:  No changes  If you need a refill on your cardiac medications before your next appointment, please call your pharmacy.   Lab work: No new labs needed  Testing/Procedures: No new testing needed  Follow-Up: At [facility], you and your health needs are our priority.  As part of our continuing mission to provide you with exceptional heart care, we have created designated Provider Care Teams.  These Care Teams include your primary Cardiologist (physician) and Advanced Practice Providers (APPs -  Physician Assistants and Nurse Practitioners) who all work together to provide you with the care you need, when you need it.  You will need a follow up appointment in 12 months  Providers on your designated Care Team:   Christopher Berge, NP Ryan Dunn, PA-C Cadence Furth, PA-C  COVID-19 Vaccine Information can be found at: [email or web address] For questions related to vaccine distribution or appointments, please email [email or web address] or call [phone].   

## 2023-06-08 ENCOUNTER — Inpatient Hospital Stay
Admission: EM | Admit: 2023-06-08 | Discharge: 2023-06-10 | DRG: 177 | Disposition: A | Discharge status: Home / Self Care | Payer: PPO | Attending: Student | Admitting: Student

## 2023-06-08 ENCOUNTER — Emergency Department: Payer: PPO

## 2023-06-08 ENCOUNTER — Other Ambulatory Visit: Payer: Self-pay

## 2023-06-08 DIAGNOSIS — N400 Enlarged prostate without lower urinary tract symptoms: Secondary | ICD-10-CM | POA: Insufficient documentation

## 2023-06-08 DIAGNOSIS — R7401 Elevation of levels of liver transaminase levels: Secondary | ICD-10-CM | POA: Diagnosis present

## 2023-06-08 DIAGNOSIS — R053 Chronic cough: Secondary | ICD-10-CM | POA: Diagnosis not present

## 2023-06-08 DIAGNOSIS — Z7982 Long term (current) use of aspirin: Secondary | ICD-10-CM

## 2023-06-08 DIAGNOSIS — J208 Acute bronchitis due to other specified organisms: Secondary | ICD-10-CM | POA: Diagnosis present

## 2023-06-08 DIAGNOSIS — I10 Essential (primary) hypertension: Secondary | ICD-10-CM | POA: Diagnosis present

## 2023-06-08 DIAGNOSIS — K224 Dyskinesia of esophagus: Secondary | ICD-10-CM | POA: Diagnosis present

## 2023-06-08 DIAGNOSIS — R059 Cough, unspecified: Secondary | ICD-10-CM | POA: Diagnosis not present

## 2023-06-08 DIAGNOSIS — K219 Gastro-esophageal reflux disease without esophagitis: Secondary | ICD-10-CM | POA: Diagnosis present

## 2023-06-08 DIAGNOSIS — Z8673 Personal history of transient ischemic attack (TIA), and cerebral infarction without residual deficits: Secondary | ICD-10-CM

## 2023-06-08 DIAGNOSIS — Z96653 Presence of artificial knee joint, bilateral: Secondary | ICD-10-CM | POA: Diagnosis present

## 2023-06-08 DIAGNOSIS — E785 Hyperlipidemia, unspecified: Secondary | ICD-10-CM | POA: Diagnosis not present

## 2023-06-08 DIAGNOSIS — Z885 Allergy status to narcotic agent status: Secondary | ICD-10-CM

## 2023-06-08 DIAGNOSIS — I252 Old myocardial infarction: Secondary | ICD-10-CM

## 2023-06-08 DIAGNOSIS — Z79899 Other long term (current) drug therapy: Secondary | ICD-10-CM | POA: Diagnosis not present

## 2023-06-08 DIAGNOSIS — J45909 Unspecified asthma, uncomplicated: Secondary | ICD-10-CM | POA: Diagnosis not present

## 2023-06-08 DIAGNOSIS — Z7984 Long term (current) use of oral hypoglycemic drugs: Secondary | ICD-10-CM

## 2023-06-08 DIAGNOSIS — I25810 Atherosclerosis of coronary artery bypass graft(s) without angina pectoris: Secondary | ICD-10-CM | POA: Diagnosis not present

## 2023-06-08 DIAGNOSIS — G4733 Obstructive sleep apnea (adult) (pediatric): Secondary | ICD-10-CM | POA: Diagnosis present

## 2023-06-08 DIAGNOSIS — Z974 Presence of external hearing-aid: Secondary | ICD-10-CM

## 2023-06-08 DIAGNOSIS — U071 COVID-19: Secondary | ICD-10-CM | POA: Diagnosis not present

## 2023-06-08 DIAGNOSIS — Z8249 Family history of ischemic heart disease and other diseases of the circulatory system: Secondary | ICD-10-CM

## 2023-06-08 DIAGNOSIS — K2270 Barrett's esophagus without dysplasia: Secondary | ICD-10-CM | POA: Diagnosis present

## 2023-06-08 DIAGNOSIS — I2510 Atherosclerotic heart disease of native coronary artery without angina pectoris: Secondary | ICD-10-CM | POA: Diagnosis present

## 2023-06-08 DIAGNOSIS — Z951 Presence of aortocoronary bypass graft: Secondary | ICD-10-CM

## 2023-06-08 DIAGNOSIS — Z7951 Long term (current) use of inhaled steroids: Secondary | ICD-10-CM

## 2023-06-08 DIAGNOSIS — E1159 Type 2 diabetes mellitus with other circulatory complications: Secondary | ICD-10-CM | POA: Diagnosis present

## 2023-06-08 DIAGNOSIS — R0902 Hypoxemia: Secondary | ICD-10-CM | POA: Diagnosis not present

## 2023-06-08 DIAGNOSIS — J9601 Acute respiratory failure with hypoxia: Secondary | ICD-10-CM | POA: Diagnosis not present

## 2023-06-08 DIAGNOSIS — R7989 Other specified abnormal findings of blood chemistry: Secondary | ICD-10-CM | POA: Diagnosis not present

## 2023-06-08 DIAGNOSIS — U099 Post covid-19 condition, unspecified: Secondary | ICD-10-CM | POA: Diagnosis present

## 2023-06-08 DIAGNOSIS — R509 Fever, unspecified: Secondary | ICD-10-CM | POA: Diagnosis not present

## 2023-06-08 DIAGNOSIS — E871 Hypo-osmolality and hyponatremia: Secondary | ICD-10-CM | POA: Diagnosis not present

## 2023-06-08 DIAGNOSIS — Z955 Presence of coronary angioplasty implant and graft: Secondary | ICD-10-CM

## 2023-06-08 DIAGNOSIS — E860 Dehydration: Secondary | ICD-10-CM | POA: Diagnosis present

## 2023-06-08 DIAGNOSIS — Z83438 Family history of other disorder of lipoprotein metabolism and other lipidemia: Secondary | ICD-10-CM

## 2023-06-08 DIAGNOSIS — Z823 Family history of stroke: Secondary | ICD-10-CM

## 2023-06-08 DIAGNOSIS — Z86010 Personal history of colonic polyps: Secondary | ICD-10-CM

## 2023-06-08 DIAGNOSIS — G459 Transient cerebral ischemic attack, unspecified: Secondary | ICD-10-CM | POA: Insufficient documentation

## 2023-06-08 DIAGNOSIS — Z8616 Personal history of COVID-19: Secondary | ICD-10-CM | POA: Diagnosis not present

## 2023-06-08 DIAGNOSIS — F32A Depression, unspecified: Secondary | ICD-10-CM | POA: Diagnosis present

## 2023-06-08 DIAGNOSIS — R6510 Systemic inflammatory response syndrome (SIRS) of non-infectious origin without acute organ dysfunction: Secondary | ICD-10-CM

## 2023-06-08 LAB — COMPREHENSIVE METABOLIC PANEL
ALT: 61 U/L — ABNORMAL HIGH (ref 0–44)
AST: 78 U/L — ABNORMAL HIGH (ref 15–41)
Albumin: 3.6 g/dL (ref 3.5–5.0)
Alkaline Phosphatase: 116 U/L (ref 38–126)
Anion gap: 12 (ref 5–15)
BUN: 11 mg/dL (ref 8–23)
CO2: 21 mmol/L — ABNORMAL LOW (ref 22–32)
Calcium: 8.4 mg/dL — ABNORMAL LOW (ref 8.9–10.3)
Chloride: 99 mmol/L (ref 98–111)
Creatinine, Ser: 1.22 mg/dL (ref 0.61–1.24)
GFR, Estimated: >60 mL/min (ref >60)
Glucose, Bld: 143 mg/dL — ABNORMAL HIGH (ref 70–99)
Potassium: 4.2 mmol/L (ref 3.5–5.1)
Sodium: 132 mmol/L — ABNORMAL LOW (ref 135–145)
Total Bilirubin: 1.5 mg/dL — ABNORMAL HIGH (ref 0.3–1.2)
Total Protein: 7.5 g/dL (ref 6.5–8.1)

## 2023-06-08 LAB — CBC WITH DIFFERENTIAL/PLATELET
Abs Immature Granulocytes: 0.05 10*3/uL (ref 0.00–0.07)
Basophils Absolute: 0 10*3/uL (ref 0.0–0.1)
Basophils Relative: 0 %
Eosinophils Absolute: 0.1 10*3/uL (ref 0.0–0.5)
Eosinophils Relative: 2 %
HCT: 42.6 % (ref 39.0–52.0)
Hemoglobin: 14.4 g/dL (ref 13.0–17.0)
Immature Granulocytes: 1 %
Lymphocytes Relative: 7 %
Lymphs Abs: 0.6 10*3/uL — ABNORMAL LOW (ref 0.7–4.0)
MCH: 26.9 pg (ref 26.0–34.0)
MCHC: 33.8 g/dL (ref 30.0–36.0)
MCV: 79.5 fL — ABNORMAL LOW (ref 80.0–100.0)
Monocytes Absolute: 0.6 10*3/uL (ref 0.1–1.0)
Monocytes Relative: 7 %
Neutro Abs: 7.4 10*3/uL (ref 1.7–7.7)
Neutrophils Relative %: 83 %
Platelets: 187 10*3/uL (ref 150–400)
RBC: 5.36 MIL/uL (ref 4.22–5.81)
RDW: 13.6 % (ref 11.5–15.5)
WBC: 8.9 10*3/uL (ref 4.0–10.5)
nRBC: 0 % (ref 0.0–0.2)

## 2023-06-08 LAB — TROPONIN I (HIGH SENSITIVITY)
Troponin I (High Sensitivity): 9 ng/L (ref <18)
Troponin I (High Sensitivity): 8 ng/L (ref <18)

## 2023-06-08 MED ORDER — SODIUM CHLORIDE 0.9 % IV BOLUS
500.0000 mL | Freq: Once | INTRAVENOUS | Status: AC
  Administered 2023-06-08: 500 mL via INTRAVENOUS

## 2023-06-08 MED ORDER — ACETAMINOPHEN 500 MG PO TABS
1000.0000 mg | ORAL_TABLET | Freq: Once | ORAL | Status: AC
  Administered 2023-06-08: 1000 mg via ORAL
  Filled 2023-06-08: qty 2

## 2023-06-08 MED ORDER — IPRATROPIUM-ALBUTEROL 0.5-2.5 (3) MG/3ML IN SOLN
3.0000 mL | Freq: Once | RESPIRATORY_TRACT | Status: AC
  Administered 2023-06-08: 3 mL via RESPIRATORY_TRACT
  Filled 2023-06-08: qty 3

## 2023-06-08 MED ORDER — IBUPROFEN 600 MG PO TABS
600.0000 mg | ORAL_TABLET | Freq: Once | ORAL | Status: AC
  Administered 2023-06-08: 600 mg via ORAL
  Filled 2023-06-08: qty 1

## 2023-06-08 NOTE — H&P (Incomplete)
History and Physical    Patient: [name] DOB: 1956-06-06 DOA: 06/08/2023 DOS: the patient was seen and examined on 06/08/2023 PCP: Dorothey Baseman, MD  Patient coming from: Home  Chief Complaint:  Chief Complaint  Patient presents with  . Weakness    HPI: [name] is a 67 y.o. male with medical history significant for CAD s/p CABG(2014), HTN, DM, asthma, OSA on CPAP, , esophageal spasm depression, who presents to the ED with a 6-day history of myalgias, cough, nasal congestion, fatigue and positive home COVID tests as well as a positive test at the urgent care.  He denies fever or chills.  He has dyspnea with minimal exertion as well as excessive coughing.  He believes he might have contracted it after visiting his mother in the hospital who is currently also symptomatic.   ED course and data review: Tmax 99.1 with BP 153/89, pulse 94-114 and tachypneic to 24.  Desats to 89% with ambulation improving to the mid 90s with rest. Labs: CBC unremarkable.  CMP with mild hyponatremia of 131, mild transaminitis with AST/ALT of 78/61 and total bili 1.5.: EKG, pulse reviewed and interpreted showing NSR at 97 with no ischemic ST-T wave changes.  Chest x-ray shows no focal consolidation or pleural effusion Patient was treated with Tylenol, ibuprofen and given a DuoNeb due to wheezing and also gently hydrated with NS 500 mL. Hospitalist consulted for admission.   Review of Systems: As mentioned in the history of present illness. All other systems reviewed and are negative.  Past Medical History:  Diagnosis Date  . Anxiety   . Atherosclerosis of coronary artery bypass graft with angina pectoris (HCC)   . Barrett's esophagus   . Benign enlargement of prostate   . Chronic constipation   . Colon polyp   . Coronary artery disease   . COVID-19    1/22 3/22  . Depression   . GERD (gastroesophageal reflux disease)   . H/O esophageal spasm   . Hyperlipidemia   .  Hypertension   . MI (myocardial infarction) (HCC) 2011 & 2014   x 2  . Post-COVID chronic cough   . Sleep apnea     after weight loss no need for CPAP  . TIA (transient ischemic attack)    "many yrs ago" - no deficits  . Type 2 diabetes mellitus (HCC)   . Wears hearing aid in both ears    Past Surgical History:  Procedure Laterality Date  . abscess cellulitis resection     . ANAL FISSURE REPAIR    . CARDIAC CATHETERIZATION  2011  . CATARACT EXTRACTION W/PHACO Left 12/30/2021   Procedure: CATARACT EXTRACTION PHACO AND INTRAOCULAR LENS PLACEMENT (IOC) LEFT DIABETIC;  Surgeon: Lockie Mola, MD;  Location: [facility];  Service: Ophthalmology;  Laterality: Left;  Diabetic 2.75 00:34.3  . CATARACT EXTRACTION W/PHACO Right 01/13/2022   Procedure: CATARACT EXTRACTION PHACO AND INTRAOCULAR LENS PLACEMENT (IOC) RIGHT DIABETIC;  Surgeon: Lockie Mola, MD;  Location: [facility];  Service: Ophthalmology;  Laterality: Right;  Diabetic 5.34 00:56.7  . COLONOSCOPY    . COLONOSCOPY WITH PROPOFOL N/A 06/20/2019   Procedure: COLONOSCOPY WITH PROPOFOL;  Surgeon: Toledo, Boykin Nearing, MD;  Location: [facility];  Service: Gastroenterology;  Laterality: N/A;  . CORONARY ANGIOPLASTY WITH STENT PLACEMENT  2011   stent placement x 3 @ [facility]; Dr. Darrold Junker  . CORONARY ARTERY BYPASS GRAFT  2014   CABG x 2  . ESOPHAGOGASTRODUODENOSCOPY (EGD) WITH PROPOFOL N/A  06/20/2019   Procedure: ESOPHAGOGASTRODUODENOSCOPY (EGD) WITH PROPOFOL;  Surgeon: Toledo, Boykin Nearing, MD;  Location: [facility];  Service: Gastroenterology;  Laterality: N/A;  . EXCISION MASS NECK  1985   faliculitis, incision became infected and was in hospital for 21 days  . MOUTH SURGERY  2019  . PARTIAL KNEE ARTHROPLASTY Right 07/19/2017   Procedure: UNICOMPARTMENTAL KNEE;  Surgeon: Christena Flake, MD;  Location: [facility];  Service: Orthopedics;  Laterality: Right;  . TEE WITH CARDIOVERSION    . TOTAL KNEE  ARTHROPLASTY Right 10/23/2018   Procedure: CONVERSION OF PARTIAL TO TOTAL KNEE ARTHROPLASTY;  Surgeon: Christena Flake, MD;  Location: [facility];  Service: Orthopedics;  Laterality: Right;  . TOTAL KNEE ARTHROPLASTY Left 01/10/2020   Procedure: TOTAL KNEE ARTHROPLASTY;  Surgeon: Christena Flake, MD;  Location: [facility];  Service: Orthopedics;  Laterality: Left;   Social History:  reports that he has never smoked. He has never used smokeless tobacco. He reports that he does not drink alcohol and does not use drugs.  Allergies  Allergen Reactions  . Oxycodone Other (See Comments)    Hallucinations  . Hydrocodone     hallucinations  . Tramadol     hallucination    Family History  Problem Relation Age of Onset  . Hypertension Mother   . Hyperlipidemia Mother   . Stroke Mother   . Hypertension Father   . Hyperlipidemia Father     Prior to Admission medications   Medication Sig Start Date End Date Taking? Authorizing Provider  acetaminophen (TYLENOL) 325 MG tablet Take 650 mg by mouth every 6 (six) hours as needed for moderate pain or headache.    Yes [provider]  albuterol (PROVENTIL HFA;VENTOLIN HFA) 108 (90 BASE) MCG/ACT inhaler Inhale 1-2 puffs into the lungs every 6 (six) hours as needed for wheezing or shortness of breath.    Yes [provider]  alfuzosin (UROXATRAL) 10 MG 24 hr tablet Take 10 mg by mouth daily. 06/02/20  Yes [provider]  Amphetamine-Dextroamphetamine (ADDERALL PO) Take 1 tablet by mouth daily as needed.   Yes [provider]  ASPIRIN 81 PO Take by mouth daily.   Yes [provider]  atorvastatin (LIPITOR) 40 MG tablet Take 40 mg by mouth daily.    Yes [provider]  cetirizine (ZYRTEC) 10 MG tablet Take 10 mg by mouth daily as needed for allergies.    Yes [provider]  diphenhydrAMINE (BENADRYL) 50 MG capsule Take 50 mg by mouth at bedtime as needed for sleep.   Yes [provider]   ezetimibe (ZETIA) 10 MG tablet TAKE 1 TABLET BY MOUTH EVERY DAY 05/20/21  Yes Gollan, Tollie Pizza, MD  fluticasone-salmeterol (ADVAIR HFA) 115-21 MCG/ACT inhaler Inhale 2 puffs into the lungs 2 (two) times daily. 05/12/16  Yes Gollan, Tollie Pizza, MD  losartan (COZAAR) 50 MG tablet Take 50 mg by mouth daily. 07/21/20  Yes [provider]  metFORMIN (GLUCOPHAGE) 850 MG tablet Take 850 mg by mouth daily with breakfast.    Yes [provider]  metoprolol succinate (TOPROL-XL) 25 MG 24 hr tablet Take 25 mg by mouth daily.   Yes [provider]  mirabegron ER (MYRBETRIQ) 25 MG TB24 tablet  08/21/19  Yes [provider]  nitroGLYCERIN (NITROSTAT) 0.4 MG SL tablet Place 1 tablet (0.4 mg total) under the tongue every 5 (five) minutes as needed for chest pain. 05/01/20  Yes Alver Sorrow, NP  RABEprazole (ACIPHEX) 20  MG tablet Take 20 mg by mouth daily.    Yes [provider]  TRADJENTA 5 MG TABS tablet Take 5 mg by mouth daily.   Yes [provider]  traZODone (DESYREL) 100 MG tablet TAKE 1/2 TO 1 TABLET BY MOUTH NIGHTLY. 08/05/20  Yes [provider]  venlafaxine XR (EFFEXOR-XR) 75 MG 24 hr capsule Take 225 mg by mouth daily. 05/23/17  Yes [provider]  fluticasone (FLONASE) 50 MCG/ACT nasal spray Place into the nose. 01/04/22 04/05/23  [provider]    Physical Exam: Vitals:   06/08/23 2001 06/08/23 2003 06/08/23 2101 06/08/23 2128  BP: (!) 153/89     Pulse: 94  (S) (!) 114   Resp: (!) 24     Temp: 99.1 F (37.3 C)     TempSrc: Oral     SpO2: 96%  (S) (!) 89% 94%  Weight:  111.1 kg    Height:  5\' 10"  (1.778 m)     Physical Exam  Labs on Admission: I have personally reviewed following labs and imaging studies  CBC: Recent Labs  Lab 06/08/23 2005  WBC 8.9  NEUTROABS 7.4  HGB 14.4  HCT 42.6  MCV 79.5*  PLT 187   Basic Metabolic Panel: Recent Labs  Lab 06/08/23 2005  NA 132*  K 4.2  CL 99  CO2 21*   GLUCOSE 143*  BUN 11  CREATININE 1.22  CALCIUM 8.4*   GFR: Estimated Creatinine Clearance: 71.3 mL/min (by C-G formula based on SCr of 1.22 mg/dL). Liver Function Tests: Recent Labs  Lab 06/08/23 2005  AST 78*  ALT 61*  ALKPHOS 116  BILITOT 1.5*  PROT 7.5  ALBUMIN 3.6   No results for input(s): "LIPASE", "AMYLASE" in the last 168 hours. No results for input(s): "AMMONIA" in the last 168 hours. Coagulation Profile: No results for input(s): "INR", "PROTIME" in the last 168 hours. Cardiac Enzymes: No results for input(s): "CKTOTAL", "CKMB", "CKMBINDEX", "TROPONINI" in the last 168 hours. BNP (last 3 results) No results for input(s): "PROBNP" in the last 8760 hours. HbA1C: No results for input(s): "HGBA1C" in the last 72 hours. CBG: No results for input(s): "GLUCAP" in the last 168 hours. Lipid Profile: No results for input(s): "CHOL", "HDL", "LDLCALC", "TRIG", "CHOLHDL", "LDLDIRECT" in the last 72 hours. Thyroid Function Tests: No results for input(s): "TSH", "T4TOTAL", "FREET4", "T3FREE", "THYROIDAB" in the last 72 hours. Anemia Panel: No results for input(s): "VITAMINB12", "FOLATE", "FERRITIN", "TIBC", "IRON", "RETICCTPCT" in the last 72 hours. Urine analysis:    Component Value Date/Time   COLORURINE YELLOW (A) 01/08/2020 0848   APPEARANCEUR CLEAR (A) 01/08/2020 0848   APPEARANCEUR Clear 08/25/2012 1045   LABSPEC 1.016 01/08/2020 0848   LABSPEC 1.028 08/25/2012 1045   PHURINE 5.0 01/08/2020 0848   GLUCOSEU NEGATIVE 01/08/2020 0848   GLUCOSEU Negative 08/25/2012 1045   HGBUR NEGATIVE 01/08/2020 0848   BILIRUBINUR NEGATIVE 01/08/2020 0848   BILIRUBINUR Negative 08/25/2012 1045   KETONESUR NEGATIVE 01/08/2020 0848   PROTEINUR 30 (A) 01/08/2020 0848   NITRITE NEGATIVE 01/08/2020 0848   LEUKOCYTESUR TRACE (A) 01/08/2020 0848   LEUKOCYTESUR Negative 08/25/2012 1045    Radiological Exams on Admission: DG Chest 1 View  Result Date: 06/08/2023 CLINICAL DATA:   Diagnosed with COVID 6 days ago with worsening symptoms over the last 2 days. EXAM: CHEST  1 VIEW COMPARISON:  Chest radiograph 11/01/2018 FINDINGS: Median sternotomy wires are again noted. The heart is not enlarged. The upper mediastinal contours are normal There is  no focal consolidation or pulmonary edema. There is no pleural effusion or pneumothorax There is no acute osseous abnormality. IMPRESSION: No focal consolidation or pleural effusion. Electronically Signed   By: Lesia Hausen M.D.   On: 06/08/2023 20:42     Data Reviewed: Relevant notes from primary care and specialist visits, past discharge summaries as available in EHR, including Care Everywhere. Prior diagnostic testing as pertinent to current admission diagnoses Updated medications and problem lists for reconciliation ED course, including vitals, labs, imaging, treatment and response to treatment Triage notes, nursing and pharmacy notes and ED provider's notes Notable results as noted in HPI   Assessment and Plan: No notes have been filed under this hospital service. Service: Hospitalist       DVT prophylaxis: Lovenox***  Consults: none***  Advance Care Planning:   Code Status: Prior ***  Family Communication: none***  Disposition Plan: Back to previous home environment  Severity of Illness: {Observation/Inpatient:21159}  Author: Andris Baumann, MD 06/08/2023 11:11 PM  For on call review [email or web address].

## 2023-06-08 NOTE — H&P (Signed)
History and Physical    Patient: [name] KVQ:[id number] DOB: 31-Jan-1956 DOA: 06/08/2023 DOS: the patient was seen and examined on 06/08/2023 PCP: Dorothey Baseman, MD  Patient coming from: Home  Chief Complaint:  Chief Complaint  Patient presents with   Weakness    HPI: [name] is a 67 y.o. male with medical history significant for CAD s/p CABG(2014), HTN, DM, asthma, OSA on CPAP, , esophageal spasm depression, who presents to the ED with a 6-day history of myalgias, cough, nasal congestion, fatigue and positive home COVID tests as well as a positive test at the urgent care.  He denies fever or chills.  He has dyspnea with minimal exertion as well as excessive coughing.  He believes he might have contracted it after visiting his mother in the hospital who is currently also symptomatic.   ED course and data review: Tmax 99.1 with BP 153/89, pulse 94-114 and tachypneic to 24.  Desats to 89% with ambulation improving to the mid 90s with rest. Labs: CBC unremarkable.  CMP with mild hyponatremia of 131, mild transaminitis with AST/ALT of 78/61 and total bili 1.5.: EKG, pulse reviewed and interpreted showing NSR at 97 with no ischemic ST-T wave changes.  Chest x-ray shows no focal consolidation or pleural effusion Patient was treated with Tylenol, ibuprofen and given a DuoNeb due to wheezing and also gently hydrated with NS 500 mL. Hospitalist consulted for admission.   Review of Systems: As mentioned in the history of present illness. All other systems reviewed and are negative.  Past Medical History:  Diagnosis Date   Anxiety    Atherosclerosis of coronary artery bypass graft with angina pectoris (HCC)    Barrett's esophagus    Benign enlargement of prostate    Chronic constipation    Colon polyp    Coronary artery disease    COVID-19    1/22 3/22   Depression    GERD (gastroesophageal reflux disease)    H/O esophageal spasm    Hyperlipidemia    Hypertension    MI  (myocardial infarction) (HCC) 2011 & 2014   x 2   Post-COVID chronic cough    Sleep apnea     after weight loss no need for CPAP   TIA (transient ischemic attack)    "many yrs ago" - no deficits   Type 2 diabetes mellitus (HCC)    Wears hearing aid in both ears    Past Surgical History:  Procedure Laterality Date   abscess cellulitis resection      ANAL FISSURE REPAIR     CARDIAC CATHETERIZATION  2011   CATARACT EXTRACTION W/PHACO Left 12/30/2021   Procedure: CATARACT EXTRACTION PHACO AND INTRAOCULAR LENS PLACEMENT (IOC) LEFT DIABETIC;  Surgeon: Lockie Mola, MD;  Location: [facility];  Service: Ophthalmology;  Laterality: Left;  Diabetic 2.75 00:34.3   CATARACT EXTRACTION W/PHACO Right 01/13/2022   Procedure: CATARACT EXTRACTION PHACO AND INTRAOCULAR LENS PLACEMENT (IOC) RIGHT DIABETIC;  Surgeon: Lockie Mola, MD;  Location: [facility];  Service: Ophthalmology;  Laterality: Right;  Diabetic 5.34 00:56.7   COLONOSCOPY     COLONOSCOPY WITH PROPOFOL N/A 06/20/2019   Procedure: COLONOSCOPY WITH PROPOFOL;  Surgeon: Toledo, Boykin Nearing, MD;  Location: [facility];  Service: Gastroenterology;  Laterality: N/A;   CORONARY ANGIOPLASTY WITH STENT PLACEMENT  2011   stent placement x 3 @ [facility]; Dr. Darrold Junker   CORONARY ARTERY BYPASS GRAFT  2014   CABG x 2   ESOPHAGOGASTRODUODENOSCOPY (EGD) WITH PROPOFOL N/A  06/20/2019   Procedure: ESOPHAGOGASTRODUODENOSCOPY (EGD) WITH PROPOFOL;  Surgeon: Toledo, Boykin Nearing, MD;  Location: [facility];  Service: Gastroenterology;  Laterality: N/A;   EXCISION MASS NECK  1985   faliculitis, incision became infected and was in hospital for 21 days   MOUTH SURGERY  2019   PARTIAL KNEE ARTHROPLASTY Right 07/19/2017   Procedure: UNICOMPARTMENTAL KNEE;  Surgeon: Christena Flake, MD;  Location: [facility];  Service: Orthopedics;  Laterality: Right;   TEE WITH CARDIOVERSION     TOTAL KNEE ARTHROPLASTY Right 10/23/2018   Procedure:  CONVERSION OF PARTIAL TO TOTAL KNEE ARTHROPLASTY;  Surgeon: Christena Flake, MD;  Location: [facility];  Service: Orthopedics;  Laterality: Right;   TOTAL KNEE ARTHROPLASTY Left 01/10/2020   Procedure: TOTAL KNEE ARTHROPLASTY;  Surgeon: Christena Flake, MD;  Location: [facility];  Service: Orthopedics;  Laterality: Left;   Social History:  reports that he has never smoked. He has never used smokeless tobacco. He reports that he does not drink alcohol and does not use drugs.  Allergies  Allergen Reactions   Oxycodone Other (See Comments)    Hallucinations   Hydrocodone     hallucinations   Tramadol     hallucination    Family History  Problem Relation Age of Onset   Hypertension Mother    Hyperlipidemia Mother    Stroke Mother    Hypertension Father    Hyperlipidemia Father     Prior to Admission medications   Medication Sig Start Date End Date Taking? Authorizing Provider  acetaminophen (TYLENOL) 325 MG tablet Take 650 mg by mouth every 6 (six) hours as needed for moderate pain or headache.    Yes [provider]  albuterol (PROVENTIL HFA;VENTOLIN HFA) 108 (90 BASE) MCG/ACT inhaler Inhale 1-2 puffs into the lungs every 6 (six) hours as needed for wheezing or shortness of breath.    Yes [provider]  alfuzosin (UROXATRAL) 10 MG 24 hr tablet Take 10 mg by mouth daily. 06/02/20  Yes [provider]  Amphetamine-Dextroamphetamine (ADDERALL PO) Take 1 tablet by mouth daily as needed.   Yes [provider]  ASPIRIN 81 PO Take by mouth daily.   Yes [provider]  atorvastatin (LIPITOR) 40 MG tablet Take 40 mg by mouth daily.    Yes [provider]  cetirizine (ZYRTEC) 10 MG tablet Take 10 mg by mouth daily as needed for allergies.    Yes [provider]  diphenhydrAMINE (BENADRYL) 50 MG capsule Take 50 mg by mouth at bedtime as needed for sleep.   Yes [provider]  ezetimibe (ZETIA) 10 MG tablet TAKE 1 TABLET BY MOUTH  EVERY DAY 05/20/21  Yes Gollan, Tollie Pizza, MD  fluticasone-salmeterol (ADVAIR HFA) 115-21 MCG/ACT inhaler Inhale 2 puffs into the lungs 2 (two) times daily. 05/12/16  Yes Gollan, Tollie Pizza, MD  losartan (COZAAR) 50 MG tablet Take 50 mg by mouth daily. 07/21/20  Yes [provider]  metFORMIN (GLUCOPHAGE) 850 MG tablet Take 850 mg by mouth daily with breakfast.    Yes [provider]  metoprolol succinate (TOPROL-XL) 25 MG 24 hr tablet Take 25 mg by mouth daily.   Yes [provider]  mirabegron ER (MYRBETRIQ) 25 MG TB24 tablet  08/21/19  Yes [provider]  nitroGLYCERIN (NITROSTAT) 0.4 MG SL tablet Place 1 tablet (0.4 mg total) under the tongue every 5 (five) minutes as needed for chest pain. 05/01/20  Yes Alver Sorrow, NP  RABEprazole (ACIPHEX) 20  MG tablet Take 20 mg by mouth daily.    Yes [provider]  TRADJENTA 5 MG TABS tablet Take 5 mg by mouth daily.   Yes [provider]  traZODone (DESYREL) 100 MG tablet TAKE 1/2 TO 1 TABLET BY MOUTH NIGHTLY. 08/05/20  Yes [provider]  venlafaxine XR (EFFEXOR-XR) 75 MG 24 hr capsule Take 225 mg by mouth daily. 05/23/17  Yes [provider]  fluticasone (FLONASE) 50 MCG/ACT nasal spray Place into the nose. 01/04/22 04/05/23  [provider]    Physical Exam: Vitals:   06/08/23 2001 06/08/23 2003 06/08/23 2101 06/08/23 2128  BP: (!) 153/89     Pulse: 94  (S) (!) 114   Resp: (!) 24     Temp: 99.1 F (37.3 C)     TempSrc: Oral     SpO2: 96%  (S) (!) 89% 94%  Weight:  111.1 kg    Height:  5\' 10"  (1.778 m)     Physical Exam Vitals and nursing note reviewed.  Constitutional:      General: He is not in acute distress.    Comments: Conversational dyspnea  HENT:     Head: Normocephalic and atraumatic.  Cardiovascular:     Rate and Rhythm: Regular rhythm. Tachycardia present.     Heart sounds: Normal heart sounds.  Pulmonary:     Effort: Tachypnea present.      Breath sounds: Wheezing present.  Abdominal:     Palpations: Abdomen is soft.     Tenderness: There is no abdominal tenderness.  Neurological:     Mental Status: Mental status is at baseline.     Labs on Admission: I have personally reviewed following labs and imaging studies  CBC: Recent Labs  Lab 06/08/23 2005  WBC 8.9  NEUTROABS 7.4  HGB 14.4  HCT 42.6  MCV 79.5*  PLT 187   Basic Metabolic Panel: Recent Labs  Lab 06/08/23 2005  NA 132*  K 4.2  CL 99  CO2 21*  GLUCOSE 143*  BUN 11  CREATININE 1.22  CALCIUM 8.4*   GFR: Estimated Creatinine Clearance: 71.3 mL/min (by C-G formula based on SCr of 1.22 mg/dL). Liver Function Tests: Recent Labs  Lab 06/08/23 2005  AST 78*  ALT 61*  ALKPHOS 116  BILITOT 1.5*  PROT 7.5  ALBUMIN 3.6   No results for input(s): "LIPASE", "AMYLASE" in the last 168 hours. No results for input(s): "AMMONIA" in the last 168 hours. Coagulation Profile: No results for input(s): "INR", "PROTIME" in the last 168 hours. Cardiac Enzymes: No results for input(s): "CKTOTAL", "CKMB", "CKMBINDEX", "TROPONINI" in the last 168 hours. BNP (last 3 results) No results for input(s): "PROBNP" in the last 8760 hours. HbA1C: No results for input(s): "HGBA1C" in the last 72 hours. CBG: No results for input(s): "GLUCAP" in the last 168 hours. Lipid Profile: No results for input(s): "CHOL", "HDL", "LDLCALC", "TRIG", "CHOLHDL", "LDLDIRECT" in the last 72 hours. Thyroid Function Tests: No results for input(s): "TSH", "T4TOTAL", "FREET4", "T3FREE", "THYROIDAB" in the last 72 hours. Anemia Panel: No results for input(s): "VITAMINB12", "FOLATE", "FERRITIN", "TIBC", "IRON", "RETICCTPCT" in the last 72 hours. Urine analysis:    Component Value Date/Time   COLORURINE YELLOW (A) 01/08/2020 0848   APPEARANCEUR CLEAR (A) 01/08/2020 0848   APPEARANCEUR Clear 08/25/2012 1045   LABSPEC 1.016 01/08/2020 0848   LABSPEC 1.028 08/25/2012 1045   PHURINE 5.0  01/08/2020 0848   GLUCOSEU NEGATIVE 01/08/2020 0848   GLUCOSEU Negative 08/25/2012 1045  HGBUR NEGATIVE 01/08/2020 0848   BILIRUBINUR NEGATIVE 01/08/2020 0848   BILIRUBINUR Negative 08/25/2012 1045   KETONESUR NEGATIVE 01/08/2020 0848   PROTEINUR 30 (A) 01/08/2020 0848   NITRITE NEGATIVE 01/08/2020 0848   LEUKOCYTESUR TRACE (A) 01/08/2020 0848   LEUKOCYTESUR Negative 08/25/2012 1045    Radiological Exams on Admission: DG Chest 1 View  Result Date: 06/08/2023 CLINICAL DATA:  Diagnosed with COVID 6 days ago with worsening symptoms over the last 2 days. EXAM: CHEST  1 VIEW COMPARISON:  Chest radiograph 11/01/2018 FINDINGS: Median sternotomy wires are again noted. The heart is not enlarged. The upper mediastinal contours are normal There is no focal consolidation or pulmonary edema. There is no pleural effusion or pneumothorax There is no acute osseous abnormality. IMPRESSION: No focal consolidation or pleural effusion. Electronically Signed   By: Lesia Hausen M.D.   On: 06/08/2023 20:42     Data Reviewed: Relevant notes from primary care and specialist visits, past discharge summaries as available in EHR, including Care Everywhere. Prior diagnostic testing as pertinent to current admission diagnoses Updated medications and problem lists for reconciliation ED course, including vitals, labs, imaging, treatment and response to treatment Triage notes, nursing and pharmacy notes and ED provider's notes Notable results as noted in HPI   Assessment and Plan: * COVID-19 virus infection Hypoxia Asthma Supportive care, given onset of symptoms over 5 days prior Patient having desaturations to 89% with exertion Supplemental oxygen, antitussives, incentive spirometer Albuterol every 6 and as needed Steroids Continue home Advair or formulary substitution  SIRS (systemic inflammatory response syndrome) (HCC) Possible sepsis secondary to COVID On arrival patient was tachycardic to 114 with  temp 99.1, tachypneic to 24 O2 sat 89 with ambulation, BP softened to 113/54 following fluid bolus in the ED. Will get procalcitonin and lactic acid Will bolus and continue fluids given that chest x-ray is clear but monitor closely for worsening of COVID symptoms   Transaminitis AST/ALT of 78/61 and total bili 1.5. Possibly related to SIRS/sepsis Monitor for response with hydration and can consider further workup if uptrending  Obstructive sleep apnea syndrome Nighttime CPAP  Spasm of esophagus Continue PPI  Essential hypertension Continue losartan and metoprolol  Benign enlargement of prostate Continue Uroxatrol  Depression Continue Effexor and trazodone  Type 2 diabetes mellitus with other circulatory complications (HCC) Sliding scale insulin coverage  CAD S/P CABG x 2 (2014) No complaints of chest pain.  EKG nonacute Continue aspirin, atorvastatin, ezetimibe, losartan, nitroglycerin     DVT prophylaxis: Lovenox  Consults: none  Advance Care Planning:   Code Status: Prior   Family Communication: none  Disposition Plan: Back to previous home environment  Severity of Illness: The appropriate patient status for this patient is INPATIENT. Inpatient status is judged to be reasonable and necessary in order to provide the required intensity of service to ensure the patient's safety. The patient's presenting symptoms, physical exam findings, and initial radiographic and laboratory data in the context of their chronic comorbidities is felt to place them at high risk for further clinical deterioration. Furthermore, it is not anticipated that the patient will be medically stable for discharge from the hospital within 2 midnights of admission.   * I certify that at the point of admission it is my clinical judgment that the patient will require inpatient hospital care spanning beyond 2 midnights from the point of admission due to high intensity of service, high risk for further  deterioration and high frequency of surveillance required.*  Author: Odetta Pink  Para March, MD 06/08/2023 11:11 PM  For on call review [email or web address].

## 2023-06-08 NOTE — ED Provider Notes (Signed)
[facility] Provider Note    Event Date/Time   First MD Initiated Contact with Patient 06/08/23 2057     (approximate)   History   Weakness   HPI  [name] is a 67 y.o. male   Past medical history of CAD, hypertension, hyperlipidemia, diabetes presents emerged department with myalgias, cough, nasal congestion, fatigue and has tested positive for COVID with several home tests as well as a positive test at urgent care.  He has been worsening over the last several days gets remarkably short of breath and coughs easily with mild exertion.  He visited his mother in the hospital last week and both of them contracted COVID this week.  Independent Historian contributed to assessment above: His wife is at bedside to corroborate information given above     Physical Exam   Triage Vital Signs: ED Triage Vitals  Encounter Vitals Group     BP 06/08/23 2001 (!) 153/89     Systolic BP Percentile --      Diastolic BP Percentile --      Pulse Rate 06/08/23 2001 94     Resp 06/08/23 2001 (!) 24     Temp 06/08/23 2001 99.1 F (37.3 C)     Temp Source 06/08/23 2001 Oral     SpO2 06/08/23 2001 96 %     Weight 06/08/23 2003 245 lb (111.1 kg)     Height 06/08/23 2003 5\' 10"  (1.778 m)     Head Circumference --      Peak Flow --      Pain Score 06/08/23 2003 0     Pain Loc --      Pain Education --      Exclude from Growth Chart --     Most recent vital signs: Vitals:   06/08/23 2101 06/08/23 2128  BP:    Pulse: (S) (!) 114   Resp:    Temp:    SpO2: (S) (!) 89% 94%    General: Awake, no distress.  CV:  Good peripheral perfusion.  Resp:  Normal effort.  Abd:  No distention.  Other:  Awake alert comfortable.  When ambulating in the emergency department he goes from normal heart rate to 114 and desaturates to 89%.  He is breathing comfortably at rest but has a reactive cough and some mild wheezing.  Soft nontender abdomen, no unilateral leg swelling.   Does not appear septic.   ED Results / Procedures / Treatments   Labs (all labs ordered are listed, but only abnormal results are displayed) Labs Reviewed  CBC WITH DIFFERENTIAL/PLATELET - Abnormal; Notable for the following components:      Result Value   MCV 79.5 (*)    Lymphs Abs 0.6 (*)    All other components within normal limits  COMPREHENSIVE METABOLIC PANEL - Abnormal; Notable for the following components:   Sodium 132 (*)    CO2 21 (*)    Glucose, Bld 143 (*)    Calcium 8.4 (*)    AST 78 (*)    ALT 61 (*)    Total Bilirubin 1.5 (*)    All other components within normal limits  TROPONIN I (HIGH SENSITIVITY)  TROPONIN I (HIGH SENSITIVITY)     I ordered and reviewed the above labs they are notable for his initial troponin is normal.  EKG  ED ECG REPORT I, Pilar Jarvis, the attending physician, personally viewed and interpreted this ECG.   Date: 06/08/2023  EKG  Time: 2008  Rate: 97  Rhythm: sinus  Axis: nl  Intervals:none  ST&T Change: no stemi    RADIOLOGY I independently reviewed and interpreted chest x-ray and I see no obvious focality or pneumothorax   PROCEDURES:  Critical Care performed: No  Procedures   MEDICATIONS ORDERED IN ED: Medications  acetaminophen (TYLENOL) tablet 1,000 mg (has no administration in time range)  ipratropium-albuterol (DUONEB) 0.5-2.5 (3) MG/3ML nebulizer solution 3 mL (has no administration in time range)  ibuprofen (ADVIL) tablet 600 mg (has no administration in time range)  sodium chloride 0.9 % bolus 500 mL (has no administration in time range)    External physician / consultants:  I spoke with hospitalist for admission and regarding care plan for this patient.   IMPRESSION / MDM / ASSESSMENT AND PLAN / ED COURSE  I reviewed the triage vital signs and the nursing notes.                                Patient's presentation is most consistent with acute presentation with potential threat to life or bodily  function.  Differential diagnosis includes, but is not limited to, COVID infection, viral URI, hypoxemia, bacterial pneumonia, sepsis, dehydration electrolyte disturbance, ACS   The patient is on the cardiac monitor to evaluate for evidence of arrhythmia and/or significant heart rate changes.  MDM: Patient with known COVID worsening over the last several days with viral URI symptoms.  Oxygen desaturation with ambulation to 89%.  Appears dehydrated.  Will give IV crystalloid bolus, Tylenol/Motrin for myalgias, DuoNeb for wheezing with a history of mild asthma.  Checked EKG which looks nonischemic, initial troponin within normal limits.  Admission.       FINAL CLINICAL IMPRESSION(S) / ED DIAGNOSES   Final diagnoses:  COVID  Exercise hypoxemia     Rx / DC Orders   ED Discharge Orders     None        Note:  This document was prepared using Dragon voice recognition software and may include unintentional dictation errors.    Pilar Jarvis, MD 06/08/23 [phone]

## 2023-06-08 NOTE — Assessment & Plan Note (Signed)
 Sliding scale insulin coverage 

## 2023-06-08 NOTE — ED Triage Notes (Signed)
Pt presents to ER with c/o generalized weakness, cough, and shortness of breath.  Pt states he was dx with COVID on Thursday 7/11.  Pt reports over the last 2 days, the symptoms have become worse.  Pt states he becomes hypoxic when he coughs, and has been running a fever up to 102 at home.  Pt is alert in triage, but appears fatigued.  Pt otherwise A&O x4.

## 2023-06-08 NOTE — ED Notes (Signed)
Pt ambulated to room 15 while O2 was being monitored.  Pt appeared fatigued when walking, and sats dropped to 89% on RA while walking.  Pt also had to take one break to sit down while walking, which is not normal for him.

## 2023-06-08 NOTE — Assessment & Plan Note (Signed)
Continue Uroxatrol

## 2023-06-08 NOTE — Assessment & Plan Note (Signed)
-   Continue Effexor and trazodone ?

## 2023-06-08 NOTE — Assessment & Plan Note (Addendum)
AST/ALT of 78/61 and total bili 1.5. Possibly related to SIRS/sepsis Monitor for response with hydration and can consider further workup if uptrending

## 2023-06-08 NOTE — Assessment & Plan Note (Signed)
No complaints of chest pain.  EKG nonacute Continue aspirin, atorvastatin, ezetimibe, losartan, nitroglycerin

## 2023-06-08 NOTE — Assessment & Plan Note (Addendum)
Hypoxia Asthma Supportive care, given onset of symptoms over 5 days prior Patient having desaturations to 89% with exertion Supplemental oxygen, antitussives, incentive spirometer Albuterol every 6 and as needed Steroids Continue home Advair or formulary substitution

## 2023-06-09 DIAGNOSIS — U071 COVID-19: Secondary | ICD-10-CM | POA: Diagnosis not present

## 2023-06-09 DIAGNOSIS — R6510 Systemic inflammatory response syndrome (SIRS) of non-infectious origin without acute organ dysfunction: Secondary | ICD-10-CM

## 2023-06-09 LAB — GLUCOSE, CAPILLARY
Glucose-Capillary: 208 mg/dL — ABNORMAL HIGH (ref 70–99)
Glucose-Capillary: 237 mg/dL — ABNORMAL HIGH (ref 70–99)
Glucose-Capillary: 189 mg/dL — ABNORMAL HIGH (ref 70–99)
Glucose-Capillary: 230 mg/dL — ABNORMAL HIGH (ref 70–99)

## 2023-06-09 LAB — CBC
HCT: 39.5 % (ref 39.0–52.0)
Hemoglobin: 13 g/dL (ref 13.0–17.0)
MCH: 26.5 pg (ref 26.0–34.0)
MCHC: 32.9 g/dL (ref 30.0–36.0)
MCV: 80.6 fL (ref 80.0–100.0)
Platelets: 156 10*3/uL (ref 150–400)
RBC: 4.9 MIL/uL (ref 4.22–5.81)
RDW: 13.7 % (ref 11.5–15.5)
WBC: 5.9 10*3/uL (ref 4.0–10.5)
nRBC: 0 % (ref 0.0–0.2)

## 2023-06-09 LAB — BASIC METABOLIC PANEL
Anion gap: 10 (ref 5–15)
BUN: 13 mg/dL (ref 8–23)
CO2: 21 mmol/L — ABNORMAL LOW (ref 22–32)
Calcium: 8.3 mg/dL — ABNORMAL LOW (ref 8.9–10.3)
Chloride: 103 mmol/L (ref 98–111)
Creatinine, Ser: 1.04 mg/dL (ref 0.61–1.24)
GFR, Estimated: >60 mL/min (ref >60)
Glucose, Bld: 215 mg/dL — ABNORMAL HIGH (ref 70–99)
Potassium: 4.2 mmol/L (ref 3.5–5.1)
Sodium: 134 mmol/L — ABNORMAL LOW (ref 135–145)

## 2023-06-09 LAB — VITAMIN B12: Vitamin B-12: 427 pg/mL (ref 180–914)

## 2023-06-09 LAB — HIV ANTIBODY (ROUTINE TESTING W REFLEX): HIV Screen 4th Generation wRfx: NONREACTIVE

## 2023-06-09 LAB — LACTIC ACID, PLASMA
Lactic Acid, Venous: 1.9 mmol/L (ref 0.5–1.9)
Lactic Acid, Venous: 1.2 mmol/L (ref 0.5–1.9)

## 2023-06-09 LAB — PHOSPHORUS: Phosphorus: 2.5 mg/dL (ref 2.5–4.6)

## 2023-06-09 LAB — MAGNESIUM: Magnesium: 2.3 mg/dL (ref 1.7–2.4)

## 2023-06-09 LAB — PROTIME-INR
INR: 1.2 (ref 0.8–1.2)
Prothrombin Time: 15.6 seconds — ABNORMAL HIGH (ref 11.4–15.2)

## 2023-06-09 LAB — PROCALCITONIN: Procalcitonin: <0.1 ng/mL

## 2023-06-09 LAB — VITAMIN D 25 HYDROXY (VIT D DEFICIENCY, FRACTURES): Vit D, 25-Hydroxy: 31.06 ng/mL (ref 30–100)

## 2023-06-09 MED ORDER — AZITHROMYCIN 500 MG PO TABS: 250.0000 mg | ORAL_TABLET | ORAL | Status: DC

## 2023-06-09 MED ORDER — ONDANSETRON HCL 4 MG PO TABS: 4.0000 mg | ORAL_TABLET | Freq: Four times a day (QID) | ORAL | Status: DC | PRN

## 2023-06-09 MED ORDER — ALBUTEROL SULFATE HFA 108 (90 BASE) MCG/ACT IN AERS
2.0000 | INHALATION_SPRAY | Freq: Four times a day (QID) | RESPIRATORY_TRACT | Status: DC
  Administered 2023-06-09: 2 via RESPIRATORY_TRACT
  Filled 2023-06-09: qty 6.7

## 2023-06-09 MED ORDER — LOSARTAN POTASSIUM 50 MG PO TABS
50.0000 mg | ORAL_TABLET | Freq: Every day | ORAL | Status: DC
  Administered 2023-06-09 – 2023-06-10 (×2): 50 mg via ORAL
  Filled 2023-06-09 (×2): qty 1

## 2023-06-09 MED ORDER — SODIUM CHLORIDE 0.9 % IV SOLN
500.0000 mg | Freq: Once | INTRAVENOUS | Status: AC
  Administered 2023-06-09: 500 mg via INTRAVENOUS
  Filled 2023-06-09: qty 5

## 2023-06-09 MED ORDER — GUAIFENESIN-DM 100-10 MG/5ML PO SYRP
5.0000 mL | ORAL_SOLUTION | ORAL | Status: DC | PRN
  Administered 2023-06-09: 5 mL via ORAL
  Filled 2023-06-09 (×2): qty 10

## 2023-06-09 MED ORDER — MENTHOL 3 MG MT LOZG
1.0000 | LOZENGE | OROMUCOSAL | Status: DC | PRN
  Administered 2023-06-09: 3 mg via ORAL
  Filled 2023-06-09: qty 9

## 2023-06-09 MED ORDER — ACETAMINOPHEN 650 MG RE SUPP: 650.0000 mg | Freq: Four times a day (QID) | RECTAL | Status: DC | PRN

## 2023-06-09 MED ORDER — IPRATROPIUM-ALBUTEROL 20-100 MCG/ACT IN AERS
2.0000 | INHALATION_SPRAY | Freq: Four times a day (QID) | RESPIRATORY_TRACT | Status: DC
  Administered 2023-06-09 – 2023-06-10 (×4): 2 via RESPIRATORY_TRACT
  Filled 2023-06-09: qty 4

## 2023-06-09 MED ORDER — SODIUM CHLORIDE 0.9 % IV SOLN: INTRAVENOUS | Status: DC | PRN

## 2023-06-09 MED ORDER — ALBUTEROL SULFATE HFA 108 (90 BASE) MCG/ACT IN AERS: 1.0000 | INHALATION_SPRAY | Freq: Four times a day (QID) | RESPIRATORY_TRACT | Status: DC | PRN

## 2023-06-09 MED ORDER — TRAZODONE HCL 50 MG PO TABS: 50.0000 mg | ORAL_TABLET | Freq: Every evening | ORAL | Status: DC | PRN

## 2023-06-09 MED ORDER — ONDANSETRON HCL 4 MG/2ML IJ SOLN: 4.0000 mg | Freq: Four times a day (QID) | INTRAMUSCULAR | Status: DC | PRN

## 2023-06-09 MED ORDER — METHYLPREDNISOLONE SODIUM SUCC 40 MG IJ SOLR
40.0000 mg | Freq: Once | INTRAMUSCULAR | Status: AC
  Administered 2023-06-09: 40 mg via INTRAVENOUS
  Filled 2023-06-09: qty 1

## 2023-06-09 MED ORDER — EZETIMIBE 10 MG PO TABS
10.0000 mg | ORAL_TABLET | Freq: Every day | ORAL | Status: DC
  Administered 2023-06-09 – 2023-06-10 (×2): 10 mg via ORAL
  Filled 2023-06-09 (×2): qty 1

## 2023-06-09 MED ORDER — ENOXAPARIN SODIUM 60 MG/0.6ML IJ SOSY
0.5000 mg/kg | PREFILLED_SYRINGE | INTRAMUSCULAR | Status: DC
  Administered 2023-06-09 – 2023-06-10 (×2): 55 mg via SUBCUTANEOUS
  Filled 2023-06-09 (×2): qty 0.6

## 2023-06-09 MED ORDER — TRAZODONE HCL 50 MG PO TABS
50.0000 mg | ORAL_TABLET | Freq: Every day | ORAL | Status: DC
  Administered 2023-06-09: 50 mg via ORAL
  Filled 2023-06-09: qty 1

## 2023-06-09 MED ORDER — LACTATED RINGERS IV SOLN
150.0000 mL/h | INTRAVENOUS | Status: DC
  Administered 2023-06-09 (×2): 150 mL/h via INTRAVENOUS

## 2023-06-09 MED ORDER — GUAIFENESIN ER 600 MG PO TB12
600.0000 mg | ORAL_TABLET | Freq: Two times a day (BID) | ORAL | Status: DC
  Administered 2023-06-09 – 2023-06-10 (×3): 600 mg via ORAL
  Filled 2023-06-09 (×3): qty 1

## 2023-06-09 MED ORDER — ATORVASTATIN CALCIUM 20 MG PO TABS
40.0000 mg | ORAL_TABLET | Freq: Every day | ORAL | Status: DC
  Administered 2023-06-09 – 2023-06-10 (×2): 40 mg via ORAL
  Filled 2023-06-09 (×2): qty 2

## 2023-06-09 MED ORDER — INSULIN ASPART 100 UNIT/ML IJ SOLN
0.0000 [IU] | Freq: Every day | INTRAMUSCULAR | Status: DC
  Administered 2023-06-09: 2 [IU] via SUBCUTANEOUS
  Filled 2023-06-09: qty 1

## 2023-06-09 MED ORDER — ALBUTEROL SULFATE (2.5 MG/3ML) 0.083% IN NEBU: 2.5000 mg | INHALATION_SOLUTION | Freq: Four times a day (QID) | RESPIRATORY_TRACT | Status: DC | PRN

## 2023-06-09 MED ORDER — INSULIN ASPART 100 UNIT/ML IJ SOLN
0.0000 [IU] | Freq: Three times a day (TID) | INTRAMUSCULAR | Status: DC
  Administered 2023-06-09 (×2): 7 [IU] via SUBCUTANEOUS
  Administered 2023-06-09: 4 [IU] via SUBCUTANEOUS
  Administered 2023-06-10: 3 [IU] via SUBCUTANEOUS
  Filled 2023-06-09 (×5): qty 1

## 2023-06-09 MED ORDER — IPRATROPIUM-ALBUTEROL 0.5-2.5 (3) MG/3ML IN SOLN: 3.0000 mL | Freq: Four times a day (QID) | RESPIRATORY_TRACT | Status: DC

## 2023-06-09 MED ORDER — METHYLPREDNISOLONE SODIUM SUCC 125 MG IJ SOLR
0.5000 mg/kg | Freq: Two times a day (BID) | INTRAMUSCULAR | Status: DC
  Administered 2023-06-09: 55.625 mg via INTRAVENOUS
  Filled 2023-06-09: qty 2

## 2023-06-09 MED ORDER — VENLAFAXINE HCL ER 75 MG PO CP24
75.0000 mg | ORAL_CAPSULE | Freq: Two times a day (BID) | ORAL | Status: DC
  Administered 2023-06-09 – 2023-06-10 (×3): 75 mg via ORAL
  Filled 2023-06-09 (×3): qty 1

## 2023-06-09 MED ORDER — IPRATROPIUM-ALBUTEROL 0.5-2.5 (3) MG/3ML IN SOLN: 3.0000 mL | Freq: Four times a day (QID) | RESPIRATORY_TRACT | Status: DC | PRN

## 2023-06-09 MED ORDER — PREDNISONE 50 MG PO TABS: 50.0000 mg | ORAL_TABLET | Freq: Every day | ORAL | Status: DC

## 2023-06-09 MED ORDER — FLUTICASONE FUROATE-VILANTEROL 200-25 MCG/ACT IN AEPB
1.0000 | INHALATION_SPRAY | Freq: Every day | RESPIRATORY_TRACT | Status: DC
  Administered 2023-06-09 – 2023-06-10 (×2): 1 via RESPIRATORY_TRACT
  Filled 2023-06-09: qty 28

## 2023-06-09 MED ORDER — ASPIRIN 81 MG PO TBEC
81.0000 mg | DELAYED_RELEASE_TABLET | Freq: Every day | ORAL | Status: DC
  Administered 2023-06-09 – 2023-06-10 (×2): 81 mg via ORAL
  Filled 2023-06-09 (×2): qty 1

## 2023-06-09 MED ORDER — ALFUZOSIN HCL ER 10 MG PO TB24: 10.0000 mg | ORAL_TABLET | Freq: Every day | ORAL | Status: DC

## 2023-06-09 MED ORDER — LINAGLIPTIN 5 MG PO TABS: 5.0000 mg | ORAL_TABLET | Freq: Every day | ORAL | Status: DC

## 2023-06-09 MED ORDER — BENZONATATE 100 MG PO CAPS
100.0000 mg | ORAL_CAPSULE | Freq: Three times a day (TID) | ORAL | Status: DC | PRN
  Administered 2023-06-09 – 2023-06-10 (×3): 100 mg via ORAL
  Filled 2023-06-09 (×3): qty 1

## 2023-06-09 MED ORDER — GUAIFENESIN-DM 100-10 MG/5ML PO SYRP
10.0000 mL | ORAL_SOLUTION | Freq: Four times a day (QID) | ORAL | Status: DC | PRN
  Administered 2023-06-09 – 2023-06-10 (×2): 10 mL via ORAL
  Filled 2023-06-09 (×2): qty 10

## 2023-06-09 MED ORDER — NITROGLYCERIN 0.4 MG SL SUBL: 0.4000 mg | SUBLINGUAL_TABLET | SUBLINGUAL | Status: DC | PRN

## 2023-06-09 MED ORDER — PANTOPRAZOLE SODIUM 40 MG PO TBEC
40.0000 mg | DELAYED_RELEASE_TABLET | Freq: Every day | ORAL | Status: DC
  Administered 2023-06-09 – 2023-06-10 (×2): 40 mg via ORAL
  Filled 2023-06-09 (×2): qty 1

## 2023-06-09 MED ORDER — DEXAMETHASONE 4 MG PO TABS
6.0000 mg | ORAL_TABLET | Freq: Every day | ORAL | Status: DC
  Administered 2023-06-10: 6 mg via ORAL
  Filled 2023-06-09: qty 2

## 2023-06-09 MED ORDER — METOPROLOL SUCCINATE ER 25 MG PO TB24: 25.0000 mg | ORAL_TABLET | Freq: Every day | ORAL | Status: DC

## 2023-06-09 MED ORDER — ACETAMINOPHEN 325 MG PO TABS
650.0000 mg | ORAL_TABLET | Freq: Four times a day (QID) | ORAL | Status: DC | PRN
  Administered 2023-06-10: 650 mg via ORAL
  Filled 2023-06-09: qty 2

## 2023-06-09 MED ORDER — ENOXAPARIN SODIUM 40 MG/0.4ML IJ SOSY: 40.0000 mg | PREFILLED_SYRINGE | INTRAMUSCULAR | Status: DC

## 2023-06-09 MED ORDER — MENTHOL 3 MG MT LOZG: 1.0000 | LOZENGE | Freq: Once | OROMUCOSAL | Status: DC

## 2023-06-09 NOTE — Assessment & Plan Note (Signed)
Nighttime CPAP ?

## 2023-06-09 NOTE — Plan of Care (Signed)
  Problem: Education: Goal: Knowledge of General Education information will improve Description: Including pain rating scale, medication(s)/side effects and non-pharmacologic comfort measures Outcome: Progressing   Problem: Health Behavior/Discharge Planning: Goal: Ability to manage health-related needs will improve Outcome: Progressing   Problem: Clinical Measurements: Goal: Will remain free from infection Outcome: Progressing   Problem: Elimination: Goal: Will not experience complications related to bowel motility Outcome: Progressing   Problem: Pain Managment: Goal: General experience of comfort will improve Outcome: Progressing

## 2023-06-09 NOTE — Assessment & Plan Note (Signed)
 Continue PPI ?

## 2023-06-09 NOTE — Assessment & Plan Note (Signed)
-  Continue losartan and metoprolol 

## 2023-06-09 NOTE — Assessment & Plan Note (Addendum)
Possible sepsis secondary to COVID On arrival patient was tachycardic to 114 with temp 99.1, tachypneic to 24 O2 sat 89 with ambulation, BP softened to 113/54 following fluid bolus in the ED. Will get procalcitonin and lactic acid Will bolus and continue fluids given that chest x-ray is clear but monitor closely for worsening of COVID symptoms

## 2023-06-09 NOTE — Progress Notes (Signed)
[facility] Progress Note  Patient: [name]    VZD:[id number]  DOA: 06/08/2023     Date of Service: the patient was seen and examined on 06/09/2023  Chief Complaint  Patient presents with   Weakness   Brief hospital course: [name] is a 67 y.o. male with medical history significant for CAD s/p CABG(2014), HTN, DM, asthma, OSA on CPAP, , esophageal spasm depression, who presents to the ED with a 6-day history of myalgias, cough, nasal congestion, fatigue and positive home COVID tests as well as a positive test at the urgent care.  He denies fever or chills.  He has dyspnea with minimal exertion as well as excessive coughing.  He believes he might have contracted it after visiting his mother in the hospital who is currently also symptomatic.   ED course and data review: Tmax 99.1 with BP 153/89, pulse 94-114 and tachypneic to 24.  Desats to 89% with ambulation improving to the mid 90s with rest. Labs: CBC unremarkable.  CMP with mild hyponatremia of 131, mild transaminitis with AST/ALT of 78/61 and total bili 1.5.: EKG, pulse reviewed and interpreted showing NSR at 97 with no ischemic ST-T wave changes.  Chest x-ray shows no focal consolidation or pleural effusion Patient was treated with Tylenol, ibuprofen and given a DuoNeb due to wheezing and also gently hydrated with NS 500 mL. Hospitalist consulted for admission.   Assessment and Plan: # COVID-19 virus infection and bronchitis # Acute hypoxic respiratory failure on exertion  # Asthma Supportive care, given onset of symptoms over 5 days prior Patient having desaturations to 89% with exertion Supplemental oxygen, antitussives, incentive spirometer Started Breo Ellipta inhaler, continue Albuterol inhalor Solu-Medrol 40 mg IV one-time dose followed by dexamethasone 6 mg p.o. daily for 8 days Mucinex 6 mg p.o. twice daily, Robitussin DM as needed, Cepacol and Tessalon perles as needed for sore throat and cough Started  Azithromycin 500 mg x 1 and the 250 mg pod x 4 days for bronchitis   # SIRS (systemic inflammatory response syndrome) secondary to COVID infection On arrival patient was tachycardic to 114 with temp 99.1, tachypneic to 24 O2 sat 89 with ambulation, BP softened to 113/54 following fluid bolus in the ED. CXR negative, wbc wnl, negative procal Sepsis rule out S/p IVF, d/cd now, continue oral hydration      Transaminitis AST/ALT of 78/61 and total bili 1.5. Possibly related to SIRS due to covid Monitor for response with hydration and can consider further workup if uptrending   Obstructive sleep apnea syndrome Nighttime CPAP   Spasm of esophagus Continue PPI   Essential hypertension Continue losartan   Benign enlargement of prostate Continue Uroxatrol   Depression Continue Effexor    Type 2 diabetes mellitus with other circulatory complications (HCC) Sliding scale insulin coverage   CAD S/P CABG x 2 (2014) No complaints of chest pain.  EKG nonacute Continue aspirin, atorvastatin, ezetimibe, losartan, nitroglycerin   Body mass index is 35.15 kg/m.  Interventions:  Diet: Heart healthy and carb modified diet DVT Prophylaxis: Subcutaneous Lovenox   Advance goals of care discussion: Full code  Family Communication: family was present at bedside, at the time of interview.  The pt provided permission to discuss medical plan with the family. Opportunity was given to ask question and all questions were answered satisfactorily.   Disposition:  Pt is from Home, admitted with DOE hypoxia due to COVID, still has DOE, which precludes a safe discharge. Discharge to Home, when  stable most likely tomorrow.  Subjective: No significant events overnight, patient still having sore throat, productive cough, sweating and feels generalized weakness and tiredness.  Denied any chest pain or palpitations, no abdominal pain, no nausea vomiting or diarrhea.  Physical Exam: General: NAD, lying  comfortably Appear in no distress, affect appropriate Eyes: PERRLA ENT: Oral Mucosa Clear, moist  Neck: no JVD,  Cardiovascular: S1 and S2 Present, no Murmur,  Respiratory: Good air entry bilaterally, bilateral crackles, minimal wheezing. Abdomen: Bowel Sound present, Soft and no tenderness,  Skin: no rashes Extremities: no Pedal edema, no calf tenderness Neurologic: without any new focal findings Gait not checked due to patient safety concerns  Vitals:   06/09/23 0155 06/09/23 0447 06/09/23 0733 06/09/23 1220  BP: 101/61 118/66 135/81 (!) 149/87  Pulse: 67 70 68 75  Resp:   18 18  Temp: 97.6 F (36.4 C) 97.6 F (36.4 C) 97.9 F (36.6 C) 98 F (36.7 C)  TempSrc: Oral     SpO2: 96% 97% 97%   Weight:      Height:        Intake/Output Summary (Last 24 hours) at 06/09/2023 1325 Last data filed at 06/09/2023 0919 Gross per 24 hour  Intake 1003.97 ml  Output --  Net 1003.97 ml   Filed Weights   06/08/23 2003  Weight: 111.1 kg    Data Reviewed: I have personally reviewed and interpreted daily labs, tele strips, imagings as discussed above. I reviewed all nursing notes, pharmacy notes, vitals, pertinent old records I have discussed plan of care as described above with RN and patient/family.  CBC: Recent Labs  Lab 06/08/23 2005 06/09/23 0854  WBC 8.9 5.9  NEUTROABS 7.4  --   HGB 14.4 13.0  HCT 42.6 39.5  MCV 79.5* 80.6  PLT 187 156   Basic Metabolic Panel: Recent Labs  Lab 06/08/23 2005 06/09/23 0854  NA 132* 134*  K 4.2 4.2  CL 99 103  CO2 21* 21*  GLUCOSE 143* 215*  BUN 11 13  CREATININE 1.22 1.04  CALCIUM 8.4* 8.3*  MG  --  2.3  PHOS  --  2.5    Studies: DG Chest 1 View  Result Date: 06/08/2023 CLINICAL DATA:  Diagnosed with COVID 6 days ago with worsening symptoms over the last 2 days. EXAM: CHEST  1 VIEW COMPARISON:  Chest radiograph 11/01/2018 FINDINGS: Median sternotomy wires are again noted. The heart is not enlarged. The upper mediastinal  contours are normal There is no focal consolidation or pulmonary edema. There is no pleural effusion or pneumothorax There is no acute osseous abnormality. IMPRESSION: No focal consolidation or pleural effusion. Electronically Signed   By: Lesia Hausen M.D.   On: 06/08/2023 20:42    Scheduled Meds:  aspirin EC  81 mg Oral Daily   atorvastatin  40 mg Oral Daily   methylPREDNISolone (SOLU-MEDROL) injection  40 mg Intravenous Once   Followed by   [START ON 06/10/2023] dexamethasone  6 mg Oral Daily   enoxaparin (LOVENOX) injection  0.5 mg/kg Subcutaneous Q24H   ezetimibe  10 mg Oral Daily   fluticasone furoate-vilanterol  1 puff Inhalation Daily   guaiFENesin  600 mg Oral BID   insulin aspart  0-20 Units Subcutaneous TID WC   insulin aspart  0-5 Units Subcutaneous QHS   ipratropium-albuterol  3 mL Nebulization Q6H   pantoprazole  40 mg Oral Daily   traZODone  50 mg Oral QHS   venlafaxine XR  75 mg Oral  BID   Continuous Infusions: PRN Meds: acetaminophen **OR** acetaminophen, benzonatate, guaiFENesin-dextromethorphan, ipratropium-albuterol **FOLLOWED BY** [START ON 06/11/2023] ipratropium-albuterol, menthol-cetylpyridinium, nitroGLYCERIN, ondansetron **OR** ondansetron (ZOFRAN) IV  Time spent: 55 minutes  Author: Gillis Santa. MD [facility] 06/09/2023 1:25 PM  To reach On-call, see care teams to locate the attending and reach out to them via [email or web address]. If 7PM-7AM, please contact night-coverage If you still have difficulty reaching the attending provider, please page the [facility] (Director on Call) for [facility] on amion for assistance.

## 2023-06-09 NOTE — Progress Notes (Signed)
Patient maintained O2 sats at 96% on room air while ambulating.

## 2023-06-10 DIAGNOSIS — U071 COVID-19: Secondary | ICD-10-CM | POA: Diagnosis not present

## 2023-06-10 LAB — CBC
HCT: 38.1 % — ABNORMAL LOW (ref 39.0–52.0)
Hemoglobin: 12.8 g/dL — ABNORMAL LOW (ref 13.0–17.0)
MCH: 27 pg (ref 26.0–34.0)
MCHC: 33.6 g/dL (ref 30.0–36.0)
MCV: 80.4 fL (ref 80.0–100.0)
Platelets: 189 10*3/uL (ref 150–400)
RBC: 4.74 MIL/uL (ref 4.22–5.81)
RDW: 13.5 % (ref 11.5–15.5)
WBC: 12.8 10*3/uL — ABNORMAL HIGH (ref 4.0–10.5)
nRBC: 0 % (ref 0.0–0.2)

## 2023-06-10 LAB — MAGNESIUM: Magnesium: 2.4 mg/dL (ref 1.7–2.4)

## 2023-06-10 LAB — BASIC METABOLIC PANEL
Anion gap: 10 (ref 5–15)
BUN: 18 mg/dL (ref 8–23)
CO2: 19 mmol/L — ABNORMAL LOW (ref 22–32)
Calcium: 8.3 mg/dL — ABNORMAL LOW (ref 8.9–10.3)
Chloride: 104 mmol/L (ref 98–111)
Creatinine, Ser: 1.01 mg/dL (ref 0.61–1.24)
GFR, Estimated: >60 mL/min (ref >60)
Glucose, Bld: 139 mg/dL — ABNORMAL HIGH (ref 70–99)
Potassium: 3.9 mmol/L (ref 3.5–5.1)
Sodium: 133 mmol/L — ABNORMAL LOW (ref 135–145)

## 2023-06-10 LAB — HEPATIC FUNCTION PANEL
ALT: 116 U/L — ABNORMAL HIGH (ref 0–44)
AST: 133 U/L — ABNORMAL HIGH (ref 15–41)
Albumin: 3.2 g/dL — ABNORMAL LOW (ref 3.5–5.0)
Alkaline Phosphatase: 92 U/L (ref 38–126)
Bilirubin, Direct: 0.2 mg/dL (ref 0.0–0.2)
Indirect Bilirubin: 0.6 mg/dL (ref 0.3–0.9)
Total Bilirubin: 0.8 mg/dL (ref 0.3–1.2)
Total Protein: 6.6 g/dL (ref 6.5–8.1)

## 2023-06-10 LAB — HEMOGLOBIN A1C
Hgb A1c MFr Bld: 6.7 % — ABNORMAL HIGH (ref 4.8–5.6)
Mean Plasma Glucose: 146 mg/dL

## 2023-06-10 LAB — GLUCOSE, CAPILLARY
Glucose-Capillary: 126 mg/dL — ABNORMAL HIGH (ref 70–99)
Glucose-Capillary: 141 mg/dL — ABNORMAL HIGH (ref 70–99)

## 2023-06-10 LAB — PHOSPHORUS: Phosphorus: 2.9 mg/dL (ref 2.5–4.6)

## 2023-06-10 MED ORDER — BENZONATATE 100 MG PO CAPS: 100.0000 mg | ORAL_CAPSULE | Freq: Three times a day (TID) | ORAL | 0 refills | Status: DC | PRN

## 2023-06-10 MED ORDER — VITAMIN D (ERGOCALCIFEROL) 1.25 MG (50000 UNIT) PO CAPS: 50000.0000 [IU] | ORAL_CAPSULE | ORAL | 0 refills | Status: AC

## 2023-06-10 MED ORDER — VITAMIN D (ERGOCALCIFEROL) 1.25 MG (50000 UNIT) PO CAPS
50000.0000 [IU] | ORAL_CAPSULE | ORAL | Status: DC
  Administered 2023-06-10: 50000 [IU] via ORAL
  Filled 2023-06-10: qty 1

## 2023-06-10 MED ORDER — CYANOCOBALAMIN 1000 MCG PO TABS: 1000.0000 ug | ORAL_TABLET | Freq: Every day | ORAL | 0 refills | Status: AC

## 2023-06-10 MED ORDER — AZITHROMYCIN 500 MG PO TABS: 500.0000 mg | ORAL_TABLET | Freq: Every day | ORAL | 0 refills | Status: AC

## 2023-06-10 MED ORDER — DEXAMETHASONE 6 MG PO TABS: 6.0000 mg | ORAL_TABLET | Freq: Every day | ORAL | 0 refills | Status: AC

## 2023-06-10 MED ORDER — IPRATROPIUM-ALBUTEROL 20-100 MCG/ACT IN AERS: 2.0000 | INHALATION_SPRAY | Freq: Four times a day (QID) | RESPIRATORY_TRACT | 0 refills | Status: DC | PRN

## 2023-06-10 MED ORDER — GUAIFENESIN ER 600 MG PO TB12: 600.0000 mg | ORAL_TABLET | Freq: Two times a day (BID) | ORAL | 0 refills | Status: AC

## 2023-06-10 MED ORDER — GUAIFENESIN-DM 100-10 MG/5ML PO SYRP: 10.0000 mL | ORAL_SOLUTION | Freq: Four times a day (QID) | ORAL | 0 refills | Status: DC | PRN

## 2023-06-10 MED ORDER — VITAMIN B-12 1000 MCG PO TABS
1000.0000 ug | ORAL_TABLET | Freq: Every day | ORAL | Status: DC
  Administered 2023-06-10: 1000 ug via ORAL

## 2023-06-10 NOTE — Discharge Summary (Signed)
[facility] Discharge Summary   Patient: [name] [id number]:[id number]  PCP: Dorothey Baseman, MD  Date of admission: 06/08/2023   Date of discharge:  06/10/2023     Discharge Diagnoses:   Principal Problem:   COVID-19 virus infection Active Problems:   SIRS (systemic inflammatory response syndrome) (HCC)   Hypoxia   Asthma   Transaminitis   CAD S/P CABG x 2 (2014)   Type 2 diabetes mellitus with other circulatory complications (HCC)   Coronary artery disease   Depression   Benign enlargement of prostate   Essential hypertension   Spasm of esophagus   Obstructive sleep apnea syndrome   Admitted From: Home Disposition:  Home   Recommendations for Outpatient Follow-up:  PCP: In 1 week Follow up LABS/TEST: CMP after 1 week   Diet recommendation: Cardiac diet and carb modified diet  Activity: The patient is advised to gradually reintroduce usual activities, as tolerated  Discharge Condition: stable  Code Status: Full code   History of present illness: As per the H and P dictated on admission Hospital Course:  [name] is a 67 y.o. male with medical history significant for CAD s/p CABG(2014), HTN, DM, asthma, OSA on CPAP, , esophageal spasm depression, who presents to the ED with a 6-day history of myalgias, cough, nasal congestion, fatigue and positive home COVID tests as well as a positive test at the urgent care.  He denies fever or chills.  He has dyspnea with minimal exertion as well as excessive coughing.  He believes he might have contracted it after visiting his mother in the hospital who is currently also symptomatic.   ED course and data review: Tmax 99.1 with BP 153/89, pulse 94-114 and tachypneic to 24.  Desats to 89% with ambulation improving to the mid 90s with rest. Labs: CBC unremarkable.  CMP with mild hyponatremia of 131, mild transaminitis with AST/ALT of 78/61 and total bili 1.5.: EKG, pulse reviewed and interpreted showing NSR at 97 with no  ischemic ST-T wave changes.  Chest x-ray shows no focal consolidation or pleural effusion Patient was treated with Tylenol, ibuprofen and given a DuoNeb due to wheezing and also gently hydrated with NS 500 mL. Hospitalist consulted for admission.    Assessment and Plan: # COVID-19 virus infection and bronchitis # Acute hypoxic respiratory failure on exertion  # Asthma Supportive care, given onset of symptoms over 5 days prior. Patient having desaturations to 89% with exertion. S/p Supplemental oxygen, antitussives, incentive spirometer. Started Breo Ellipta inhaler, albuterol inhalor. S/p Solu-Medrol 40 mg IV one-time dose followed by dexamethasone 6 mg p.o. daily. Mucinex 600 mg p.o. twice daily, Robitussin DM as needed, Cepacol and Tessalon perles as needed for sore throat and cough. S/p Azithromycin 500 mg x 1 and the 250 mg pod.  Patient's symptoms improved, saturating well on room air and dyspnea on exertion improved. Patient was discharged on symptomatic treatment, azithromycin 500 mg p.o.d for 3 days.   # SIRS (systemic inflammatory response syndrome) secondary to COVID infection On arrival patient was tachycardic to 114 with temp 99.1, tachypneic to 24 O2 sat 89 with ambulation, BP softened to 113/54 following fluid bolus in the ED. CXR negative, wbc wnl, negative procal. Sepsis rule out. S/p IVF, d/cd now, continue oral hydration  # Transaminitis: AST/ALT of 78/61 and total bili 1.5, Possibly related to SIRS due to covid.  Still LFTs are elevated, patient was advised to follow with PCP to repeat CMP after 1 week. # Obstructive sleep  apnea syndrome: Nighttime CPAP # Spasm of esophagus, Continue PPI # Essential hypertension: Continue losartan # Benign enlargement of prostate, Continue Uroxatrol # Depression, Continue Effexor  # Type 2 diabetes mellitus with other circulatory complications s/p Sliding scale insulin coverage, resumed home meds, monitor CBG and continue diabetic diet. # CAD  S/P CABG x 2 (2014) No complaints of chest pain.  EKG nonacute Continue aspirin, atorvastatin, ezetimibe, losartan, nitroglycerin # Vitamin D level 31, at lower end, started oral supplement. # Vitamin B12 level 427, goal >400, at lower end, started oral supplement.   Body mass index is 35.15 kg/m.  Nutrition Interventions:  Patient was ambulatory without any assistance. On the day of the discharge the patient's vitals were stable, and no other acute medical condition were reported by patient. the patient was felt safe to be discharge at Home.  Consultants: None Procedures: None  Discharge Exam: General: Appear in no distress, no Rash; Oral Mucosa Clear, moist. Cardiovascular: S1 and S2 Present, no Murmur, Respiratory: normal respiratory effort, Bilateral Air entry present and no Crackles, no wheezes Abdomen: Bowel Sound present, Soft and no tenderness, no hernia Extremities: no Pedal edema, no calf tenderness Neurology: alert and oriented to time, place, and person affect appropriate.  Filed Weights   06/08/23 2003  Weight: 111.1 kg   Vitals:   06/10/23 0553 06/10/23 0827  BP: (!) 147/77 (!) 149/82  Pulse: 70 72  Resp: 16 20  Temp: (!) 97.5 F (36.4 C) 97.7 F (36.5 C)  SpO2: 100% 98%    DISCHARGE MEDICATION: Allergies as of 06/10/2023       Reactions   Oxycodone Other (See Comments)   Hallucinations   Hydrocodone    hallucinations   Tramadol    hallucination        Medication List     STOP taking these medications    albuterol 108 (90 Base) MCG/ACT inhaler Commonly known as: VENTOLIN HFA   alfuzosin 10 MG 24 hr tablet Commonly known as: UROXATRAL   ezetimibe 10 MG tablet Commonly known as: ZETIA   metoprolol succinate 25 MG 24 hr tablet Commonly known as: TOPROL-XL   Tradjenta 5 MG Tabs tablet Generic drug: linagliptin   traZODone 100 MG tablet Commonly known as: DESYREL       TAKE these medications    acetaminophen 325 MG  tablet Commonly known as: TYLENOL Take 650 mg by mouth every 6 (six) hours as needed for moderate pain or headache.   ASPIRIN 81 PO Take by mouth daily.   atorvastatin 40 MG tablet Commonly known as: LIPITOR Take 40 mg by mouth daily.   azithromycin 500 MG tablet Commonly known as: ZITHROMAX Take 1 tablet (500 mg total) by mouth daily for 3 days.   benzonatate 100 MG capsule Commonly known as: TESSALON Take 1 capsule (100 mg total) by mouth 3 (three) times daily as needed for cough.   cetirizine 10 MG tablet Commonly known as: ZYRTEC Take 10 mg by mouth daily as needed for allergies.   cyanocobalamin 1000 MCG tablet Take 1 tablet (1,000 mcg total) by mouth daily. Start taking on: June 11, 2023   dexamethasone 6 MG tablet Commonly known as: DECADRON Take 1 tablet (6 mg total) by mouth daily for 7 days. Start taking on: June 11, 2023   diphenhydrAMINE 50 MG capsule Commonly known as: BENADRYL Take 50 mg by mouth at bedtime as needed for sleep.   fluticasone 50 MCG/ACT nasal spray Commonly known as: FLONASE Place into the  nose.   fluticasone-salmeterol 115-21 MCG/ACT inhaler Commonly known as: ADVAIR HFA Inhale 2 puffs into the lungs 2 (two) times daily.   guaiFENesin 600 MG 12 hr tablet Commonly known as: MUCINEX Take 1 tablet (600 mg total) by mouth 2 (two) times daily for 7 days.   guaiFENesin-dextromethorphan 100-10 MG/5ML syrup Commonly known as: ROBITUSSIN DM Take 10 mLs by mouth every 6 (six) hours as needed for cough.   Ipratropium-Albuterol 20-100 MCG/ACT Aers respimat Commonly known as: COMBIVENT Inhale 2 puffs into the lungs every 6 (six) hours as needed for wheezing.   losartan 50 MG tablet Commonly known as: COZAAR Take 50 mg by mouth daily.   metFORMIN 850 MG tablet Commonly known as: GLUCOPHAGE Take 850 mg by mouth daily with breakfast.   nitroGLYCERIN 0.4 MG SL tablet Commonly known as: NITROSTAT Place 1 tablet (0.4 mg total) under the  tongue every 5 (five) minutes as needed for chest pain.   RABEprazole 20 MG tablet Commonly known as: ACIPHEX Take 20 mg by mouth daily.   venlafaxine XR 75 MG 24 hr capsule Commonly known as: EFFEXOR-XR Take 75 mg by mouth 2 (two) times daily.   Vitamin D (Ergocalciferol) 1.25 MG (50000 UNIT) Caps capsule Commonly known as: DRISDOL Take 1 capsule (50,000 Units total) by mouth every 7 (seven) days. Start taking on: June 17, 2023       Allergies  Allergen Reactions   Oxycodone Other (See Comments)    Hallucinations   Hydrocodone     hallucinations   Tramadol     hallucination   Discharge Instructions     Call MD for:  difficulty breathing, headache or visual disturbances   Complete by: As directed    Call MD for:  extreme fatigue   Complete by: As directed    Call MD for:  persistant dizziness or light-headedness   Complete by: As directed    Call MD for:  persistant nausea and vomiting   Complete by: As directed    Call MD for:  severe uncontrolled pain   Complete by: As directed    Call MD for:  temperature >100.4   Complete by: As directed    Diet - low sodium heart healthy   Complete by: As directed    Discharge instructions   Complete by: As directed    F/u with PCP in 1 wk   Increase activity slowly   Complete by: As directed        The results of significant diagnostics from this hospitalization (including imaging, microbiology, ancillary and laboratory) are listed below for reference.    Significant Diagnostic Studies: DG Chest 1 View  Result Date: 06/08/2023 CLINICAL DATA:  Diagnosed with COVID 6 days ago with worsening symptoms over the last 2 days. EXAM: CHEST  1 VIEW COMPARISON:  Chest radiograph 11/01/2018 FINDINGS: Median sternotomy wires are again noted. The heart is not enlarged. The upper mediastinal contours are normal There is no focal consolidation or pulmonary edema. There is no pleural effusion or pneumothorax There is no acute osseous  abnormality. IMPRESSION: No focal consolidation or pleural effusion. Electronically Signed   By: Lesia Hausen M.D.   On: 06/08/2023 20:42    Microbiology: No results found for this or any previous visit (from the past 240 hour(s)).   Labs: CBC: Recent Labs  Lab 06/08/23 2005 06/09/23 0854 06/10/23 0539  WBC 8.9 5.9 12.8*  NEUTROABS 7.4  --   --   HGB 14.4 13.0 12.8*  HCT  42.6 39.5 38.1*  MCV 79.5* 80.6 80.4  PLT 187 156 189   Basic Metabolic Panel: Recent Labs  Lab 06/08/23 2005 06/09/23 0854 06/10/23 0539  NA 132* 134* 133*  K 4.2 4.2 3.9  CL 99 103 104  CO2 21* 21* 19*  GLUCOSE 143* 215* 139*  BUN 11 13 18   CREATININE 1.22 1.04 1.01  CALCIUM 8.4* 8.3* 8.3*  MG  --  2.3 2.4  PHOS  --  2.5 2.9   Liver Function Tests: Recent Labs  Lab 06/08/23 2005 06/10/23 0539  AST 78* 133*  ALT 61* 116*  ALKPHOS 116 92  BILITOT 1.5* 0.8  PROT 7.5 6.6  ALBUMIN 3.6 3.2*   No results for input(s): "LIPASE", "AMYLASE" in the last 168 hours. No results for input(s): "AMMONIA" in the last 168 hours. Cardiac Enzymes: No results for input(s): "CKTOTAL", "CKMB", "CKMBINDEX", "TROPONINI" in the last 168 hours. BNP (last 3 results) No results for input(s): "BNP" in the last 8760 hours. CBG: Recent Labs  Lab 06/09/23 1221 06/09/23 1628 06/09/23 2120 06/10/23 0829 06/10/23 1201  GLUCAP 237* 189* 230* 126* 141*    Time spent: 35 minutes  Signed:  Gillis Santa  [facility] 06/10/2023 12:41 PM

## 2023-06-10 NOTE — Care Management Important Message (Signed)
Important Message  Patient Details  Name: [name] MRN: [id number] Date of Birth: June 27, 1956   Medicare Important Message Given:  N/A - LOS <3 / Initial given by admissions     Olegario Messier A Rendell Thivierge 06/10/2023, 10:16 AM

## 2023-06-10 NOTE — Plan of Care (Signed)

## 2023-06-13 DIAGNOSIS — U071 COVID-19: Secondary | ICD-10-CM | POA: Diagnosis not present

## 2023-06-17 DIAGNOSIS — U071 COVID-19: Secondary | ICD-10-CM | POA: Diagnosis not present

## 2023-06-17 DIAGNOSIS — J22 Unspecified acute lower respiratory infection: Secondary | ICD-10-CM | POA: Diagnosis not present

## 2023-06-17 DIAGNOSIS — E1159 Type 2 diabetes mellitus with other circulatory complications: Secondary | ICD-10-CM | POA: Diagnosis not present

## 2023-06-17 DIAGNOSIS — I10 Essential (primary) hypertension: Secondary | ICD-10-CM | POA: Diagnosis not present

## 2023-06-21 DIAGNOSIS — D692 Other nonthrombocytopenic purpura: Secondary | ICD-10-CM | POA: Diagnosis not present

## 2023-06-21 DIAGNOSIS — J449 Chronic obstructive pulmonary disease, unspecified: Secondary | ICD-10-CM | POA: Diagnosis not present

## 2023-06-21 DIAGNOSIS — Z7984 Long term (current) use of oral hypoglycemic drugs: Secondary | ICD-10-CM | POA: Diagnosis not present

## 2023-06-21 DIAGNOSIS — E119 Type 2 diabetes mellitus without complications: Secondary | ICD-10-CM | POA: Diagnosis not present

## 2023-06-21 DIAGNOSIS — I959 Hypotension, unspecified: Secondary | ICD-10-CM | POA: Diagnosis not present

## 2023-06-21 DIAGNOSIS — I25118 Atherosclerotic heart disease of native coronary artery with other forms of angina pectoris: Secondary | ICD-10-CM | POA: Diagnosis not present

## 2023-06-21 DIAGNOSIS — Z951 Presence of aortocoronary bypass graft: Secondary | ICD-10-CM | POA: Diagnosis not present

## 2023-06-21 DIAGNOSIS — Z7982 Long term (current) use of aspirin: Secondary | ICD-10-CM | POA: Diagnosis not present

## 2023-06-21 DIAGNOSIS — Z7951 Long term (current) use of inhaled steroids: Secondary | ICD-10-CM | POA: Diagnosis not present

## 2023-06-21 DIAGNOSIS — Z8616 Personal history of COVID-19: Secondary | ICD-10-CM | POA: Diagnosis not present

## 2023-06-28 ENCOUNTER — Other Ambulatory Visit
Admission: RE | Admit: 2023-06-28 | Discharge: 2023-06-28 | Disposition: A | Discharge status: Home / Self Care | Payer: PPO | Source: Ambulatory Visit | Attending: Specialist | Admitting: Specialist

## 2023-06-28 DIAGNOSIS — R0902 Hypoxemia: Secondary | ICD-10-CM | POA: Insufficient documentation

## 2023-06-28 DIAGNOSIS — U071 COVID-19: Secondary | ICD-10-CM | POA: Diagnosis not present

## 2023-06-28 DIAGNOSIS — G4733 Obstructive sleep apnea (adult) (pediatric): Secondary | ICD-10-CM | POA: Diagnosis not present

## 2023-06-28 DIAGNOSIS — R0602 Shortness of breath: Secondary | ICD-10-CM | POA: Diagnosis not present

## 2023-06-28 DIAGNOSIS — R053 Chronic cough: Secondary | ICD-10-CM | POA: Diagnosis not present

## 2023-06-28 LAB — D-DIMER, QUANTITATIVE: D-Dimer, Quant: 0.63 ug/mL-FEU — ABNORMAL HIGH (ref 0.00–0.50)

## 2023-06-29 ENCOUNTER — Ambulatory Visit
Admission: RE | Admit: 2023-06-29 | Discharge: 2023-06-29 | Disposition: A | Discharge status: Home / Self Care | Payer: PPO | Source: Ambulatory Visit | Attending: Specialist | Admitting: Specialist

## 2023-06-29 ENCOUNTER — Other Ambulatory Visit: Payer: Self-pay | Admitting: Specialist

## 2023-06-29 DIAGNOSIS — R7989 Other specified abnormal findings of blood chemistry: Secondary | ICD-10-CM

## 2023-06-29 DIAGNOSIS — Z951 Presence of aortocoronary bypass graft: Secondary | ICD-10-CM | POA: Diagnosis not present

## 2023-06-29 DIAGNOSIS — R0602 Shortness of breath: Secondary | ICD-10-CM | POA: Diagnosis not present

## 2023-06-29 DIAGNOSIS — I2510 Atherosclerotic heart disease of native coronary artery without angina pectoris: Secondary | ICD-10-CM | POA: Diagnosis not present

## 2023-06-29 DIAGNOSIS — R918 Other nonspecific abnormal finding of lung field: Secondary | ICD-10-CM | POA: Diagnosis not present

## 2023-06-29 MED ORDER — IOHEXOL 350 MG/ML SOLN
75.0000 mL | Freq: Once | INTRAVENOUS | Status: AC | PRN
  Administered 2023-06-29: 75 mL via INTRAVENOUS

## 2023-06-30 ENCOUNTER — Encounter: Payer: Self-pay | Admitting: Specialist

## 2023-06-30 DIAGNOSIS — R7989 Other specified abnormal findings of blood chemistry: Secondary | ICD-10-CM

## 2023-07-04 DIAGNOSIS — E1159 Type 2 diabetes mellitus with other circulatory complications: Secondary | ICD-10-CM | POA: Diagnosis not present

## 2023-07-12 DIAGNOSIS — R0609 Other forms of dyspnea: Secondary | ICD-10-CM | POA: Diagnosis not present

## 2023-07-19 DIAGNOSIS — R0602 Shortness of breath: Secondary | ICD-10-CM | POA: Diagnosis not present

## 2023-07-19 DIAGNOSIS — J986 Disorders of diaphragm: Secondary | ICD-10-CM | POA: Diagnosis not present

## 2023-07-27 ENCOUNTER — Other Ambulatory Visit: Payer: Self-pay | Admitting: Specialist

## 2023-07-27 DIAGNOSIS — R0602 Shortness of breath: Secondary | ICD-10-CM

## 2023-07-27 DIAGNOSIS — J986 Disorders of diaphragm: Secondary | ICD-10-CM

## 2023-07-28 DIAGNOSIS — E875 Hyperkalemia: Secondary | ICD-10-CM | POA: Diagnosis not present

## 2023-08-01 ENCOUNTER — Ambulatory Visit
Admission: RE | Admit: 2023-08-01 | Discharge: 2023-08-01 | Disposition: A | Discharge status: Home / Self Care | Payer: PPO | Source: Ambulatory Visit | Attending: Specialist | Admitting: Specialist

## 2023-08-01 DIAGNOSIS — R0602 Shortness of breath: Secondary | ICD-10-CM | POA: Diagnosis not present

## 2023-08-01 DIAGNOSIS — J986 Disorders of diaphragm: Secondary | ICD-10-CM | POA: Diagnosis not present

## 2023-08-01 DIAGNOSIS — Z8616 Personal history of COVID-19: Secondary | ICD-10-CM | POA: Diagnosis not present

## 2023-08-30 DIAGNOSIS — R0602 Shortness of breath: Secondary | ICD-10-CM | POA: Diagnosis not present

## 2023-08-30 DIAGNOSIS — J4530 Mild persistent asthma, uncomplicated: Secondary | ICD-10-CM | POA: Diagnosis not present

## 2023-09-13 DIAGNOSIS — J449 Chronic obstructive pulmonary disease, unspecified: Secondary | ICD-10-CM | POA: Diagnosis not present

## 2023-09-13 DIAGNOSIS — I25118 Atherosclerotic heart disease of native coronary artery with other forms of angina pectoris: Secondary | ICD-10-CM | POA: Diagnosis not present

## 2023-09-13 DIAGNOSIS — Z951 Presence of aortocoronary bypass graft: Secondary | ICD-10-CM | POA: Diagnosis not present

## 2023-09-13 DIAGNOSIS — G4733 Obstructive sleep apnea (adult) (pediatric): Secondary | ICD-10-CM | POA: Diagnosis not present

## 2023-09-13 DIAGNOSIS — Z7951 Long term (current) use of inhaled steroids: Secondary | ICD-10-CM | POA: Diagnosis not present

## 2023-09-13 DIAGNOSIS — I951 Orthostatic hypotension: Secondary | ICD-10-CM | POA: Diagnosis not present

## 2023-09-13 DIAGNOSIS — Z7982 Long term (current) use of aspirin: Secondary | ICD-10-CM | POA: Diagnosis not present

## 2023-09-13 DIAGNOSIS — Z7984 Long term (current) use of oral hypoglycemic drugs: Secondary | ICD-10-CM | POA: Diagnosis not present

## 2023-09-13 DIAGNOSIS — Z6834 Body mass index (BMI) 34.0-34.9, adult: Secondary | ICD-10-CM | POA: Diagnosis not present

## 2023-09-13 DIAGNOSIS — E119 Type 2 diabetes mellitus without complications: Secondary | ICD-10-CM | POA: Diagnosis not present

## 2023-09-19 DIAGNOSIS — N401 Enlarged prostate with lower urinary tract symptoms: Secondary | ICD-10-CM | POA: Diagnosis not present

## 2023-09-19 DIAGNOSIS — Z0000 Encounter for general adult medical examination without abnormal findings: Secondary | ICD-10-CM | POA: Diagnosis not present

## 2023-09-19 DIAGNOSIS — Z23 Encounter for immunization: Secondary | ICD-10-CM | POA: Diagnosis not present

## 2023-09-19 DIAGNOSIS — Z1331 Encounter for screening for depression: Secondary | ICD-10-CM | POA: Diagnosis not present

## 2023-09-19 DIAGNOSIS — E785 Hyperlipidemia, unspecified: Secondary | ICD-10-CM | POA: Diagnosis not present

## 2023-09-19 DIAGNOSIS — I25708 Atherosclerosis of coronary artery bypass graft(s), unspecified, with other forms of angina pectoris: Secondary | ICD-10-CM | POA: Diagnosis not present

## 2023-09-19 DIAGNOSIS — Z125 Encounter for screening for malignant neoplasm of prostate: Secondary | ICD-10-CM | POA: Diagnosis not present

## 2023-09-19 DIAGNOSIS — E559 Vitamin D deficiency, unspecified: Secondary | ICD-10-CM | POA: Diagnosis not present

## 2023-09-19 DIAGNOSIS — N1831 Chronic kidney disease, stage 3a: Secondary | ICD-10-CM | POA: Diagnosis not present

## 2023-09-19 DIAGNOSIS — I10 Essential (primary) hypertension: Secondary | ICD-10-CM | POA: Diagnosis not present

## 2023-09-19 DIAGNOSIS — N138 Other obstructive and reflux uropathy: Secondary | ICD-10-CM | POA: Diagnosis not present

## 2023-09-19 DIAGNOSIS — E1159 Type 2 diabetes mellitus with other circulatory complications: Secondary | ICD-10-CM | POA: Diagnosis not present

## 2023-10-11 DIAGNOSIS — H35371 Puckering of macula, right eye: Secondary | ICD-10-CM | POA: Diagnosis not present

## 2023-10-11 DIAGNOSIS — H578A1 Foreign body sensation, right eye: Secondary | ICD-10-CM | POA: Diagnosis not present

## 2023-10-11 DIAGNOSIS — Z961 Presence of intraocular lens: Secondary | ICD-10-CM | POA: Diagnosis not present

## 2023-10-11 DIAGNOSIS — E119 Type 2 diabetes mellitus without complications: Secondary | ICD-10-CM | POA: Diagnosis not present

## 2023-10-13 DIAGNOSIS — E875 Hyperkalemia: Secondary | ICD-10-CM | POA: Diagnosis not present

## 2024-04-17 ENCOUNTER — Emergency Department
Admission: EM | Admit: 2024-04-17 | Discharge: 2024-04-17 | Disposition: A | Discharge status: Home / Self Care | Attending: Emergency Medicine | Admitting: Emergency Medicine

## 2024-04-17 ENCOUNTER — Other Ambulatory Visit: Payer: Self-pay

## 2024-04-17 ENCOUNTER — Emergency Department

## 2024-04-17 DIAGNOSIS — R0602 Shortness of breath: Secondary | ICD-10-CM | POA: Diagnosis present

## 2024-04-17 DIAGNOSIS — Z951 Presence of aortocoronary bypass graft: Secondary | ICD-10-CM | POA: Insufficient documentation

## 2024-04-17 DIAGNOSIS — I2510 Atherosclerotic heart disease of native coronary artery without angina pectoris: Secondary | ICD-10-CM | POA: Diagnosis not present

## 2024-04-17 DIAGNOSIS — J9801 Acute bronchospasm: Secondary | ICD-10-CM | POA: Insufficient documentation

## 2024-04-17 DIAGNOSIS — I10 Essential (primary) hypertension: Secondary | ICD-10-CM | POA: Diagnosis not present

## 2024-04-17 DIAGNOSIS — E119 Type 2 diabetes mellitus without complications: Secondary | ICD-10-CM | POA: Diagnosis not present

## 2024-04-17 DIAGNOSIS — J101 Influenza due to other identified influenza virus with other respiratory manifestations: Secondary | ICD-10-CM | POA: Insufficient documentation

## 2024-04-17 LAB — CBC WITH DIFFERENTIAL/PLATELET
Abs Immature Granulocytes: 0.02 10*3/uL (ref 0.00–0.07)
Basophils Absolute: 0 10*3/uL (ref 0.0–0.1)
Basophils Relative: 0 %
Eosinophils Absolute: 0.1 10*3/uL (ref 0.0–0.5)
Eosinophils Relative: 1 %
HCT: 47.2 % (ref 39.0–52.0)
Hemoglobin: 15.7 g/dL (ref 13.0–17.0)
Immature Granulocytes: 0 %
Lymphocytes Relative: 18 %
Lymphs Abs: 0.9 10*3/uL (ref 0.7–4.0)
MCH: 26.4 pg (ref 26.0–34.0)
MCHC: 33.3 g/dL (ref 30.0–36.0)
MCV: 79.3 fL — ABNORMAL LOW (ref 80.0–100.0)
Monocytes Absolute: 0.4 10*3/uL (ref 0.1–1.0)
Monocytes Relative: 9 %
Neutro Abs: 3.4 10*3/uL (ref 1.7–7.7)
Neutrophils Relative %: 72 %
Platelets: 162 10*3/uL (ref 150–400)
RBC: 5.95 MIL/uL — ABNORMAL HIGH (ref 4.22–5.81)
RDW: 15.1 % (ref 11.5–15.5)
WBC: 4.8 10*3/uL (ref 4.0–10.5)
nRBC: 0 % (ref 0.0–0.2)

## 2024-04-17 LAB — BASIC METABOLIC PANEL WITH GFR
Anion gap: 11 (ref 5–15)
BUN: 22 mg/dL (ref 8–23)
CO2: 21 mmol/L — ABNORMAL LOW (ref 22–32)
Calcium: 8.3 mg/dL — ABNORMAL LOW (ref 8.9–10.3)
Chloride: 105 mmol/L (ref 98–111)
Creatinine, Ser: 1.28 mg/dL — ABNORMAL HIGH (ref 0.61–1.24)
GFR, Estimated: >60 mL/min (ref >60)
Glucose, Bld: 173 mg/dL — ABNORMAL HIGH (ref 70–99)
Potassium: 3.3 mmol/L — ABNORMAL LOW (ref 3.5–5.1)
Sodium: 137 mmol/L (ref 135–145)

## 2024-04-17 LAB — RESP PANEL BY RT-PCR (RSV, FLU A&B, COVID)  RVPGX2
Influenza A by PCR: POSITIVE — AB
Influenza B by PCR: NEGATIVE
Resp Syncytial Virus by PCR: NEGATIVE
SARS Coronavirus 2 by RT PCR: NEGATIVE

## 2024-04-17 LAB — TROPONIN I (HIGH SENSITIVITY)
Troponin I (High Sensitivity): 10 ng/L (ref <18)
Troponin I (High Sensitivity): 9 ng/L (ref <18)

## 2024-04-17 MED ORDER — IPRATROPIUM-ALBUTEROL 0.5-2.5 (3) MG/3ML IN SOLN
3.0000 mL | Freq: Once | RESPIRATORY_TRACT | Status: AC
  Administered 2024-04-17: 3 mL via RESPIRATORY_TRACT
  Filled 2024-04-17: qty 3

## 2024-04-17 MED ORDER — POTASSIUM CHLORIDE CRYS ER 20 MEQ PO TBCR
40.0000 meq | EXTENDED_RELEASE_TABLET | Freq: Once | ORAL | Status: AC
  Administered 2024-04-17: 40 meq via ORAL
  Filled 2024-04-17: qty 2

## 2024-04-17 NOTE — ED Notes (Signed)
Pt given diet coke. 

## 2024-04-17 NOTE — ED Triage Notes (Signed)
 Pt BIB [facility] from Home for SOB. Pt feeling sick with flu-like symptoms for 1 week. EMS reports some expiratory wheezes and taken OTC meds for flu symptoms. Pt given 1 albuterol  and 1 duoneb en route. Pt hx of anxiety, not taking meds, and had bypass in 2014. Vitals: 83p, 100% RA, 16rr, 25CO2, 153/86, temp 98.6 axillary

## 2024-04-17 NOTE — ED Notes (Signed)
 Pt stating having some CP starting on the left side radiating to the right side of chest. Pt describes starting off as discomfort.

## 2024-04-17 NOTE — ED Provider Notes (Signed)
 [facility] Provider Note    Event Date/Time   First MD Initiated Contact with Patient 04/17/24 1116     (approximate)   History   Chief Complaint Shortness of Breath   HPI  [name] is a 68 y.o. male with past medical history of hypertension, hyperlipidemia, diabetes, and CAD status post CABG who presents to the ED complaining of shortness of breath.  Patient reports that he has had 6 days of fevers as high as 102 associated with a dry cough and difficulty breathing.  He reports some sharp pain in his chest that is only present when he coughs, has not noticed any pain or swelling in his legs.  He states his wife has been sick with similar symptoms recently.  Patient initially with expiratory wheezing per EMS, was given 1 DuoNeb as well as an albuterol  prior to arrival.     Physical Exam   Triage Vital Signs: ED Triage Vitals  Encounter Vitals Group     BP 04/17/24 1117 (!) 155/87     Systolic BP Percentile --      Diastolic BP Percentile --      Pulse Rate 04/17/24 1117 90     Resp 04/17/24 1117 20     Temp 04/17/24 1117 98.1 F (36.7 C)     Temp Source 04/17/24 1117 Oral     SpO2 04/17/24 1117 94 %     Weight 04/17/24 1118 245 lb (111.1 kg)     Height 04/17/24 1118 5\' 10"  (1.778 m)     Head Circumference --      Peak Flow --      Pain Score 04/17/24 1117 0     Pain Loc --      Pain Education --      Exclude from Growth Chart --     Most recent vital signs: Vitals:   04/17/24 1330 04/17/24 1400  BP: 136/85 (!) 142/76  Pulse: 84 85  Resp: (!) 22 18  Temp:    SpO2: 96% 97%    Constitutional: Alert and oriented. Eyes: Conjunctivae are normal. Head: Atraumatic. Nose: No congestion/rhinnorhea. Mouth/Throat: Mucous membranes are moist.  Cardiovascular: Normal rate, regular rhythm. Grossly normal heart sounds.  2+ radial pulses bilaterally. Respiratory: Normal respiratory effort.  No retractions. Lungs CTAB. Gastrointestinal:  Soft and nontender. No distention. Musculoskeletal: No lower extremity tenderness nor edema.  Neurologic:  Normal speech and language. No gross focal neurologic deficits are appreciated.    ED Results / Procedures / Treatments   Labs (all labs ordered are listed, but only abnormal results are displayed) Labs Reviewed  RESP PANEL BY RT-PCR (RSV, FLU A&B, COVID)  RVPGX2 - Abnormal; Notable for the following components:      Result Value   Influenza A by PCR POSITIVE (*)    All other components within normal limits  CBC WITH DIFFERENTIAL/PLATELET - Abnormal; Notable for the following components:   RBC 5.95 (*)    MCV 79.3 (*)    All other components within normal limits  BASIC METABOLIC PANEL WITH GFR - Abnormal; Notable for the following components:   Potassium 3.3 (*)    CO2 21 (*)    Glucose, Bld 173 (*)    Creatinine, Ser 1.28 (*)    Calcium  8.3 (*)    All other components within normal limits  TROPONIN I (HIGH SENSITIVITY)  TROPONIN I (HIGH SENSITIVITY)     EKG  ED ECG REPORT I, Twilla Galea, the  attending physician, personally viewed and interpreted this ECG.   Date: 04/17/2024  EKG Time: 11:16  Rate: 91  Rhythm: normal sinus rhythm  Axis: Normal  Intervals:none  ST&T Change: None  RADIOLOGY Chest x-ray reviewed and interpreted by me with no infiltrate, edema, or effusion.  PROCEDURES:  Critical Care performed: No  Procedures   MEDICATIONS ORDERED IN ED: Medications  ipratropium-albuterol  (DUONEB) 0.5-2.5 (3) MG/3ML nebulizer solution 3 mL (3 mLs Nebulization Given 04/17/24 1316)     IMPRESSION / MDM / ASSESSMENT AND PLAN / ED COURSE  I reviewed the triage vital signs and the nursing notes.                              68 y.o. male with past medical history of hypertension, hyperlipidemia, diabetes, and CAD status post CABG who presents to the ED with fever, dry cough, and increasing difficulty breathing over the past 6 days.  Patient's  presentation is most consistent with acute presentation with potential threat to life or bodily function.  Differential diagnosis includes, but is not limited to, sepsis, pneumonia, ACS, PE, bronchitis, COVID-19, influenza.  Patient nontoxic-appearing and in no acute distress, vital signs are unremarkable.  He is not in any respiratory distress and I do not appreciate any wheezing at this time.  He is currently maintaining oxygen  saturations at 94% on room air, we will further assess with chest x-ray and viral panel.  EKG shows no evidence of arrhythmia or ischemia, labs including troponin are pending at this time but symptoms seem atypical for ACS or PE.  Viral testing is positive for influenza, 2 sets of troponin are negative and I doubt ACS or PE.  Remainder of labs with no significant anemia, leukocytosis, electrolyte abnormality, or AKI.  Chest x-ray is negative by my read, patient with some recurrent mild wheezing not resolved following DuoNeb.  Patient outside the window for treatment with Tamiflu.  He is appropriate for discharge home with outpatient follow-up, was counseled to return to the ED for new or worsening symptoms.  Patient agrees with plan.      FINAL CLINICAL IMPRESSION(S) / ED DIAGNOSES   Final diagnoses:  Influenza A  Bronchospasm     Rx / DC Orders   ED Discharge Orders     None        Note:  This document was prepared using Dragon voice recognition software and may include unintentional dictation errors.   Twilla Galea, MD 04/17/24 [phone]

## 2024-04-17 NOTE — ED Notes (Signed)
 Pt taken to XR.

## 2024-10-16 ENCOUNTER — Encounter: Payer: Self-pay | Admitting: Cardiovascular Disease

## 2024-10-16 ENCOUNTER — Ambulatory Visit: Attending: Cardiovascular Disease | Admitting: Cardiovascular Disease

## 2024-10-16 VITALS — BP 110/70 | HR 76 | Ht 68.0 in | Wt 237.0 lb

## 2024-10-16 DIAGNOSIS — I951 Orthostatic hypotension: Secondary | ICD-10-CM | POA: Diagnosis not present

## 2024-10-16 DIAGNOSIS — E1159 Type 2 diabetes mellitus with other circulatory complications: Secondary | ICD-10-CM | POA: Diagnosis not present

## 2024-10-16 DIAGNOSIS — I25118 Atherosclerotic heart disease of native coronary artery with other forms of angina pectoris: Secondary | ICD-10-CM | POA: Diagnosis not present

## 2024-10-16 DIAGNOSIS — E782 Mixed hyperlipidemia: Secondary | ICD-10-CM | POA: Diagnosis not present

## 2024-10-16 DIAGNOSIS — I2089 Other forms of angina pectoris: Secondary | ICD-10-CM

## 2024-10-16 DIAGNOSIS — R0789 Other chest pain: Secondary | ICD-10-CM

## 2024-10-16 MED ORDER — EZETIMIBE 10 MG PO TABS: 10.0000 mg | ORAL_TABLET | Freq: Every day | ORAL | 3 refills | Status: AC

## 2024-10-16 NOTE — Patient Instructions (Addendum)
 Medication Instructions:  ?Please start zetia 10 mg daily  ? ?If you need a refill on your cardiac medications before your next appointment, please call your pharmacy.  ? ?Lab work: ?No new labs needed ? ?Testing/Procedures: ?No new testing needed ? ?Follow-Up: ?At [facility], you and your health needs are our priority.  As part of our continuing mission to provide you with exceptional heart care, we have created designated Provider Care Teams.  These Care Teams include your primary Cardiologist (physician) and Advanced Practice Providers (APPs -  Physician Assistants and Nurse Practitioners) who all work together to provide you with the care you need, when you need it. ? ?You will need a follow up appointment in 12 months ? ?Providers on your designated Care Team:   ?Nicolasa Ducking, NP ?Eula Listen, PA-C ?Cadence Fransico Michael, PA-C ? ?COVID-19 Vaccine Information can be found at: [email or web address] For questions related to vaccine distribution or appointments, please email [email or web address] or call [phone].  ? ?

## 2024-10-16 NOTE — Progress Notes (Signed)
 Cardiology Office Note  Date:  10/16/2024   ID:  [name], DOB 06/04/1956, MRN [id number]  PCP:  Glover Lenis, MD   Chief Complaint  Patient presents with   12 month follow up     Patient c/o chest pain/discomfort and shortness of breath with little activity.     HPI:  [name] is a 68 year-old gentleman with obesity,  coronary artery disease, stenting to his RCA and circumflex in 2010,  CABG March 2014 with vein graft to the OM, LIMA to the LAD,  Postop atrial fib hyperlipidemia,  obstructive sleep apnea with compliance of his CPAP,  chronic left-sided chest pain and shortness of breath  diabetes,  esophageal spasm, bulging disc in his neck, GERD Chronic orthostasis, etiology unclear, previously on midodrine  covid x 2 infections,  1/22 and 01/2021 positive who presents for routine follow-up of his coronary artery disease, CABG, SOB  Last seen by myself in clinic 5/24  Went on a cruise May 2025 Viral infection on the way home, ended up in the hospital Flu A in 04/17/24, hypoxia Followed by Dr. Theotis Trelegy inhaler Feels he still has scarring in his lungs, chronic shortness of breath on exertion Denies chest pain on exertion concerning for angina   Mother passed away 2024-06-22 Stress with selling property Has moved several times  Going camping, [place] this weekend for Thanksgiving holiday  Prior imaging reviewed Myoview  stress test May 2023 low risk study  Lab work reviewed and detail A1C 6.1 Total  chol 159 LDL 92 Previously declined PCSK9 inhibitor  Echocardiogram June 2023 Normal left and right ventricular size and function No significant valve disease  EKG personally reviewed by myself on todays visit EKG Interpretation Date/Time:  Tuesday October 16 2024 15:02:24 EST Ventricular Rate:  76 PR Interval:  174 QRS Duration:  76 QT Interval:  376 QTC Calculation: 423 R Axis:   -16  Text Interpretation: Sinus rhythm with  occasional Premature ventricular complexes When compared with ECG of 17-Apr-2024 12:26, PVC noted Confirmed by Perla Lye [phone]) on 10/16/2024 3:03:59 PM      Lab Results  Component Value Date   CHOL  11/19/2009    114        ATP III CLASSIFICATION:  <200     mg/dL   Desirable  799-760  mg/dL   Borderline High  >=759    mg/dL   High          HDL 29 (L) 11/19/2009   LDLCALC  11/19/2009    70        Total Cholesterol/HDL:CHD Risk Coronary Heart Disease Risk Table                     Men   Women  1/2 Average Risk   3.4   3.3  Average Risk       5.0   4.4  2 X Average Risk   9.6   7.1  3 X Average Risk  23.4   11.0        Use the calculated Patient Ratio above and the CHD Risk Table to determine the patient's CHD Risk.        ATP III CLASSIFICATION (LDL):  <100     mg/dL   Optimal  899-870  mg/dL   Near or Above                    Optimal  130-159  mg/dL  Borderline  160-189  mg/dL   High  >809     mg/dL   Very High   TRIG 75 87/70/7989   Other past medical history  October 31 2015 having episode of near syncope, got up from a recliner, felt very lightheaded, recovered without syncope One episode approximately 10 days prior to that, getting up from a lawnmower to a upright position, had near-syncope syncope At that time , midodrine  up to 10 mg, Florinef  0.1 mg every other day   Previous orthostasis numbers/blood pressure numbers at home  Showed drops in blood pressure with standing sometimes 80 systolic  Previous episode of falling down or falling into people at a baseball game    He is on disability for his exercise intolerance Previously reported having lightheaded spells getting out of the car. Lasix  dosing was decreased    PMH:   has a past medical history of Anxiety, Atherosclerosis of coronary artery bypass graft with angina pectoris, Barrett's esophagus, Benign enlargement of prostate, Chronic constipation, Colon polyp, Coronary artery disease, COVID-19,  Depression, GERD (gastroesophageal reflux disease), H/O esophageal spasm, Hyperlipidemia, Hypertension, MI (myocardial infarction) (HCC) (2011 & 2014), Post-COVID chronic cough, Sleep apnea, TIA (transient ischemic attack), Type 2 diabetes mellitus (HCC), and Wears hearing aid in both ears.  PSH:    Past Surgical History:  Procedure Laterality Date   abscess cellulitis resection      ANAL FISSURE REPAIR     CARDIAC CATHETERIZATION  2011   CATARACT EXTRACTION W/PHACO Left 12/30/2021   Procedure: CATARACT EXTRACTION PHACO AND INTRAOCULAR LENS PLACEMENT (IOC) LEFT DIABETIC;  Surgeon: Mittie Gaskin, MD;  Location: [facility];  Service: Ophthalmology;  Laterality: Left;  Diabetic 2.75 00:34.3   CATARACT EXTRACTION W/PHACO Right 01/13/2022   Procedure: CATARACT EXTRACTION PHACO AND INTRAOCULAR LENS PLACEMENT (IOC) RIGHT DIABETIC;  Surgeon: Mittie Gaskin, MD;  Location: [facility];  Service: Ophthalmology;  Laterality: Right;  Diabetic 5.34 00:56.7   COLONOSCOPY     COLONOSCOPY WITH PROPOFOL  N/A 06/20/2019   Procedure: COLONOSCOPY WITH PROPOFOL ;  Surgeon: Toledo, Ladell POUR, MD;  Location: [facility];  Service: Gastroenterology;  Laterality: N/A;   CORONARY ANGIOPLASTY WITH STENT PLACEMENT  2011   stent placement x 3 @ [facility]; Dr. Ammon   CORONARY ARTERY BYPASS GRAFT  2014   CABG x 2   ESOPHAGOGASTRODUODENOSCOPY (EGD) WITH PROPOFOL  N/A 06/20/2019   Procedure: ESOPHAGOGASTRODUODENOSCOPY (EGD) WITH PROPOFOL ;  Surgeon: Toledo, Ladell POUR, MD;  Location: [facility];  Service: Gastroenterology;  Laterality: N/A;   EXCISION MASS NECK  1985   faliculitis, incision became infected and was in hospital for 21 days   MOUTH SURGERY  2019   PARTIAL KNEE ARTHROPLASTY Right 07/19/2017   Procedure: UNICOMPARTMENTAL KNEE;  Surgeon: Edie Norleen PARAS, MD;  Location: [facility];  Service: Orthopedics;  Laterality: Right;   TEE WITH CARDIOVERSION     TOTAL KNEE ARTHROPLASTY Right  10/23/2018   Procedure: CONVERSION OF PARTIAL TO TOTAL KNEE ARTHROPLASTY;  Surgeon: Edie Norleen PARAS, MD;  Location: [facility];  Service: Orthopedics;  Laterality: Right;   TOTAL KNEE ARTHROPLASTY Left 01/10/2020   Procedure: TOTAL KNEE ARTHROPLASTY;  Surgeon: Edie Norleen PARAS, MD;  Location: [facility];  Service: Orthopedics;  Laterality: Left;    Current Outpatient Medications  Medication Sig Dispense Refill   acetaminophen  (TYLENOL ) 325 MG tablet Take 650 mg by mouth every 6 (six) hours as needed for moderate pain or headache.      albuterol  (VENTOLIN  HFA) 108 (90 Base) MCG/ACT inhaler  Inhale 1 puff into the lungs every 4 (four) hours as needed.     ASPIRIN  81 PO Take by mouth daily.     atorvastatin  (LIPITOR) 40 MG tablet Take 40 mg by mouth daily.      cetirizine (ZYRTEC) 10 MG tablet Take 10 mg by mouth daily as needed for allergies.      Cholecalciferol (D 1000) 25 MCG (1000 UT) capsule Take 1,000 Units by mouth daily.     clotrimazole-betamethasone  (LOTRISONE) cream Apply 1 Application topically daily.     fluticasone  (FLONASE) 50 MCG/ACT nasal spray Place into the nose.     ibuprofen  (ADVIL ) 200 MG tablet Take 200 mg by mouth every 6 (six) hours as needed.     loratadine  (CLARITIN ) 10 MG tablet Take 10 mg by mouth daily.     losartan  (COZAAR ) 50 MG tablet Take 50 mg by mouth daily.     metFORMIN  (GLUCOPHAGE ) 850 MG tablet Take 850 mg by mouth daily with breakfast.  (Patient taking differently: Take 850 mg by mouth 2 (two) times daily with a meal.)     nitroGLYCERIN  (NITROSTAT ) 0.4 MG SL tablet Place 1 tablet (0.4 mg total) under the tongue every 5 (five) minutes as needed for chest pain. 25 tablet 3   RABEprazole (ACIPHEX) 20 MG tablet Take 20 mg by mouth daily.      traZODone  (DESYREL ) 100 MG tablet Take 100 mg by mouth at bedtime.     venlafaxine  XR (EFFEXOR -XR) 75 MG 24 hr capsule Take 75 mg by mouth 2 (two) times daily.     TRELEGY ELLIPTA 100-62.5-25 MCG/ACT AEPB Inhale 1 puff into the  lungs daily.     No current facility-administered medications for this visit.     Allergies:   Oxycodone , Hydrocodone , and Tramadol    Social History:  The patient  reports that he has never smoked. He has never used smokeless tobacco. He reports that he does not drink alcohol and does not use drugs.   Family History:   family history includes Hyperlipidemia in his father and mother; Hypertension in his father and mother; Stroke in his mother.   Review of Systems  Constitutional:  Positive for malaise/fatigue.  HENT: Negative.    Respiratory:  Positive for shortness of breath.   Cardiovascular: Negative.   Gastrointestinal: Negative.   Musculoskeletal: Negative.   Neurological: Negative.   Psychiatric/Behavioral: Negative.    All other systems reviewed and are negative.   PHYSICAL EXAM: VS:  BP 110/70 (BP Location: Left Arm, Patient Position: Sitting, Cuff Size: Normal)   Pulse 76   Ht 5' 8 (1.727 m)   Wt 237 lb (107.5 kg)   SpO2 97%   BMI 36.04 kg/m  , BMI Body mass index is 36.04 kg/m. Constitutional:  oriented to person, place, and time. No distress.  HENT:  Head: Normocephalic and atraumatic.  Eyes:  no discharge. No scleral icterus.  Neck: Normal range of motion. Neck supple. No JVD present.  Cardiovascular: Normal rate, regular rhythm, normal heart sounds and intact distal pulses. Exam reveals no gallop and no friction rub. No edema No murmur heard. Pulmonary/Chest: Effort normal and breath sounds normal. No stridor. No respiratory distress.  no wheezes.  no rales.  no tenderness.  Abdominal: Soft.  no distension.  no tenderness.  Musculoskeletal: Normal range of motion.  no  tenderness or deformity.  Neurological:  normal muscle tone. Coordination normal. No atrophy Skin: Skin is warm and dry. No rash noted. not diaphoretic.  Psychiatric:  normal mood and affect. behavior is normal. Thought content normal.   Recent Labs: 04/17/2024: BUN 22; Creatinine, Ser 1.28;  Hemoglobin 15.7; Platelets 162; Potassium 3.3; Sodium 137    Lipid Panel Reviewed through care everywhere  Wt Readings from Last 3 Encounters:  10/16/24 237 lb (107.5 kg)  04/17/24 245 lb (111.1 kg)  06/08/23 245 lb (111.1 kg)     ASSESSMENT AND PLAN:  Coronary artery disease with chronic stable angina Currently with no symptoms of angina. No further workup at this time. Continue current medication regimen.  If shortness of breath gets worse could repeat echo and stress testing  Hyperlipidemia, unspecified hyperlipidemia type  On Lipitor 40 daily, cholesterol above goal Recommend he restart Zetia  10 daily to achieve goal LDL less than 70 preferably close to 55  Orthostatic hypotension - Plan: EKG 12-Lead Blood pressure is well controlled on today's visit. No changes made to the medications. Not on midodrine   Shortness of breath -  Exacerbated by deconditioning, morbid obesity, chronic stable angina Managed by Dr. Theotis, on inhalers exacerbated by COVID, influenza  S/P CABG x 2 - Plan: EKG 12-Lead Denies anginal symptoms, no further testing  Diabetes type 2 with complications A1c trending downward, recent weight loss 20 pounds over the past year  Orders Placed This Encounter  Procedures   EKG 12-Lead     Signed, Velinda Lunger, M.D., Ph.D. 10/16/2024  [facility] [place], [place] [phone]
